# Patient Record
Sex: Female | Born: 1950 | Race: Black or African American | Hispanic: No | Marital: Married | Smoking: Former smoker
Health system: Southern US, Community
[De-identification: ages and names within clinical notes are randomized; demographics above are authoritative.]

## PROBLEM LIST (undated history)

## (undated) DIAGNOSIS — Z973 Presence of spectacles and contact lenses: Secondary | ICD-10-CM

## (undated) DIAGNOSIS — G4700 Insomnia, unspecified: Secondary | ICD-10-CM

## (undated) DIAGNOSIS — I279 Pulmonary heart disease, unspecified: Secondary | ICD-10-CM

## (undated) DIAGNOSIS — I10 Essential (primary) hypertension: Secondary | ICD-10-CM

## (undated) DIAGNOSIS — K08109 Complete loss of teeth, unspecified cause, unspecified class: Secondary | ICD-10-CM

## (undated) DIAGNOSIS — J449 Chronic obstructive pulmonary disease, unspecified: Secondary | ICD-10-CM

## (undated) DIAGNOSIS — I5032 Chronic diastolic (congestive) heart failure: Secondary | ICD-10-CM

## (undated) DIAGNOSIS — I2510 Atherosclerotic heart disease of native coronary artery without angina pectoris: Secondary | ICD-10-CM

## (undated) DIAGNOSIS — I509 Heart failure, unspecified: Secondary | ICD-10-CM

## (undated) DIAGNOSIS — E785 Hyperlipidemia, unspecified: Secondary | ICD-10-CM

## (undated) DIAGNOSIS — N182 Chronic kidney disease, stage 2 (mild): Secondary | ICD-10-CM

## (undated) DIAGNOSIS — E039 Hypothyroidism, unspecified: Secondary | ICD-10-CM

## (undated) DIAGNOSIS — Z789 Other specified health status: Secondary | ICD-10-CM

## (undated) DIAGNOSIS — R0602 Shortness of breath: Secondary | ICD-10-CM

## (undated) DIAGNOSIS — J849 Interstitial pulmonary disease, unspecified: Secondary | ICD-10-CM

## (undated) DIAGNOSIS — R911 Solitary pulmonary nodule: Secondary | ICD-10-CM

## (undated) HISTORY — PX: MULTIPLE TOOTH EXTRACTIONS: SHX2053

## (undated) HISTORY — PX: FRACTURE SURGERY: SHX138

## (undated) HISTORY — DX: Solitary pulmonary nodule: R91.1

## (undated) HISTORY — PX: TUBAL LIGATION: SHX77

## (undated) HISTORY — DX: Chronic obstructive pulmonary disease, unspecified: J44.9

## (undated) HISTORY — DX: Essential (primary) hypertension: I10

## (undated) HISTORY — DX: Hypothyroidism, unspecified: E03.9

## (undated) HISTORY — DX: Hyperlipidemia, unspecified: E78.5

## (undated) HISTORY — DX: Hypercalcemia: E83.52

## (undated) HISTORY — DX: Pulmonary heart disease, unspecified: I27.9

## (undated) HISTORY — DX: Heart failure, unspecified: I50.9

## (undated) HISTORY — DX: Shortness of breath: R06.02

## (undated) HISTORY — DX: Chronic kidney disease, stage 2 (mild): N18.2

## (undated) HISTORY — DX: Atherosclerotic heart disease of native coronary artery without angina pectoris: I25.10

## (undated) HISTORY — DX: Insomnia, unspecified: G47.00

---

## 2010-08-30 HISTORY — PX: GALLBLADDER SURGERY: SHX652

## 2010-12-02 ENCOUNTER — Ambulatory Visit: Payer: Self-pay | Admitting: Family Medicine

## 2010-12-03 ENCOUNTER — Ambulatory Visit: Payer: Self-pay | Admitting: Family Medicine

## 2010-12-04 ENCOUNTER — Ambulatory Visit: Payer: Self-pay | Admitting: Surgery

## 2010-12-04 DIAGNOSIS — I10 Essential (primary) hypertension: Secondary | ICD-10-CM

## 2012-08-22 ENCOUNTER — Inpatient Hospital Stay: Payer: Self-pay | Admitting: Internal Medicine

## 2012-08-22 LAB — BASIC METABOLIC PANEL
Co2: 23 mmol/L (ref 21–32)
Creatinine: 1.85 mg/dL — ABNORMAL HIGH (ref 0.60–1.30)
EGFR (African American): 33 — ABNORMAL LOW
EGFR (Non-African Amer.): 29 — ABNORMAL LOW
Glucose: 75 mg/dL (ref 65–99)
Osmolality: 268 (ref 275–301)
Sodium: 132 mmol/L — ABNORMAL LOW (ref 136–145)

## 2012-08-22 LAB — CBC
HGB: 12.7 g/dL (ref 12.0–16.0)
Platelet: 261 10*3/uL (ref 150–440)
RBC: 3.9 10*6/uL (ref 3.80–5.20)
WBC: 11.1 10*3/uL — ABNORMAL HIGH (ref 3.6–11.0)

## 2012-08-23 LAB — BASIC METABOLIC PANEL
Anion Gap: 10 (ref 7–16)
BUN: 30 mg/dL — ABNORMAL HIGH (ref 7–18)
Co2: 23 mmol/L (ref 21–32)
Creatinine: 1.56 mg/dL — ABNORMAL HIGH (ref 0.60–1.30)
EGFR (African American): 41 — ABNORMAL LOW
EGFR (Non-African Amer.): 35 — ABNORMAL LOW
Glucose: 72 mg/dL (ref 65–99)
Osmolality: 269 (ref 275–301)
Sodium: 132 mmol/L — ABNORMAL LOW (ref 136–145)

## 2012-08-23 LAB — CBC WITH DIFFERENTIAL/PLATELET
Basophil #: 0 10*3/uL (ref 0.0–0.1)
Basophil %: 0.2 %
Eosinophil #: 0 10*3/uL (ref 0.0–0.7)
Eosinophil %: 0 %
HCT: 34.5 % — ABNORMAL LOW (ref 35.0–47.0)
HGB: 11.8 g/dL — ABNORMAL LOW (ref 12.0–16.0)
Lymphocyte #: 0.8 10*3/uL — ABNORMAL LOW (ref 1.0–3.6)
Lymphocyte %: 7.5 %
MCH: 34.9 pg — ABNORMAL HIGH (ref 26.0–34.0)
MCV: 102 fL — ABNORMAL HIGH (ref 80–100)
Monocyte %: 1.9 %
Neutrophil #: 9.9 10*3/uL — ABNORMAL HIGH (ref 1.4–6.5)
RBC: 3.39 10*6/uL — ABNORMAL LOW (ref 3.80–5.20)
RDW: 13.7 % (ref 11.5–14.5)

## 2012-08-24 LAB — VANCOMYCIN, TROUGH: Vancomycin, Trough: 14 ug/mL (ref 10–20)

## 2012-08-24 LAB — CBC WITH DIFFERENTIAL/PLATELET
Basophil %: 0.4 %
Eosinophil %: 0.3 %
HCT: 34 % — ABNORMAL LOW (ref 35.0–47.0)
HGB: 11.4 g/dL — ABNORMAL LOW (ref 12.0–16.0)
Lymphocyte #: 1 10*3/uL (ref 1.0–3.6)
Lymphocyte %: 9.2 %
MCH: 33.9 pg (ref 26.0–34.0)
MCV: 101 fL — ABNORMAL HIGH (ref 80–100)
Monocyte #: 0.3 x10 3/mm (ref 0.2–0.9)
Monocyte %: 3.2 %
Platelet: 236 10*3/uL (ref 150–440)
RBC: 3.37 10*6/uL — ABNORMAL LOW (ref 3.80–5.20)
WBC: 10.8 10*3/uL (ref 3.6–11.0)

## 2012-08-24 LAB — BASIC METABOLIC PANEL
Anion Gap: 7 (ref 7–16)
Calcium, Total: 8 mg/dL — ABNORMAL LOW (ref 8.5–10.1)
Chloride: 106 mmol/L (ref 98–107)
Co2: 25 mmol/L (ref 21–32)
Creatinine: 0.92 mg/dL (ref 0.60–1.30)
Glucose: 80 mg/dL (ref 65–99)
Osmolality: 274 (ref 275–301)

## 2012-08-25 DIAGNOSIS — I059 Rheumatic mitral valve disease, unspecified: Secondary | ICD-10-CM

## 2012-08-26 LAB — CBC WITH DIFFERENTIAL/PLATELET
Basophil #: 0 10*3/uL (ref 0.0–0.1)
Eosinophil #: 0 10*3/uL (ref 0.0–0.7)
HCT: 32.8 % — ABNORMAL LOW (ref 35.0–47.0)
HGB: 11.4 g/dL — ABNORMAL LOW (ref 12.0–16.0)
Lymphocyte #: 1.1 10*3/uL (ref 1.0–3.6)
MCHC: 34.9 g/dL (ref 32.0–36.0)
Monocyte #: 0.9 x10 3/mm (ref 0.2–0.9)
Monocyte %: 10.7 %
Neutrophil #: 6.6 10*3/uL — ABNORMAL HIGH (ref 1.4–6.5)
Neutrophil %: 75.9 %
Platelet: 235 10*3/uL (ref 150–440)
RDW: 13.8 % (ref 11.5–14.5)
WBC: 8.7 10*3/uL (ref 3.6–11.0)

## 2012-08-26 LAB — CULTURE, BLOOD (SINGLE)

## 2012-10-03 ENCOUNTER — Ambulatory Visit: Payer: Self-pay | Admitting: Family Medicine

## 2012-10-24 ENCOUNTER — Ambulatory Visit: Payer: Self-pay | Admitting: Internal Medicine

## 2013-05-14 ENCOUNTER — Ambulatory Visit: Payer: Self-pay | Admitting: Internal Medicine

## 2014-05-14 ENCOUNTER — Ambulatory Visit: Payer: Self-pay | Admitting: Nephrology

## 2014-08-13 ENCOUNTER — Ambulatory Visit: Payer: Self-pay | Admitting: Internal Medicine

## 2014-09-30 ENCOUNTER — Ambulatory Visit: Payer: Self-pay | Admitting: Physician Assistant

## 2014-09-30 DIAGNOSIS — R0602 Shortness of breath: Secondary | ICD-10-CM

## 2014-10-02 ENCOUNTER — Encounter: Payer: Self-pay | Admitting: Cardiovascular Disease

## 2014-10-21 ENCOUNTER — Ambulatory Visit: Payer: Self-pay | Admitting: Physician Assistant

## 2014-12-17 NOTE — H&P (Signed)
PATIENT NAME:  Melissa Knox, Melissa Knox MR#:  [id number] DATE OF BIRTH:  12/06/1950  DATE OF ADMISSION:  08/22/2012  PRIMARY CARE PHYSICIAN: Tomasita Morrow, M.D.   CHIEF COMPLAINT: Cough, chills, shortness of breath along with right-sided chest pain.   HISTORY OF PRESENTING ILLNESS: A 64 year old female patient, smoker with  hypertension, who presents to the Emergency Room complaining of 2 days of left-sided chest pain, cough, shortness of breath and chills. The patient has had worsening symptoms, presented to the Emergency Room. Here her saturations were 84% on room air, needing 2 liters of oxygen to keep her saturations over 90%. Chest x-ray has shown severe pneumonia bilaterally, more so on the right side, and the patient is being admitted to the hospitalist service with community-acquired pneumonia and acute respiratory failure.   Her left-sided chest pain is pleuritic on taking a deep breath or coughing. No radiation. She has not had any fever, no sick contacts; did get an influenza vaccine this season. Smokes a pack a, day for many decades.   PAST MEDICAL HISTORY:  Hypertension, tobacco abuse.   PAST SURGICAL HISTORY:  Cholecystectomy.   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 She smokes a pack a day. No alcohol. No illicit drugs. Lives with her husband.   FAMILY HISTORY: Reviewed and unknown.   REVIEW OF SYSTEMS: CONSTITUTIONAL: Complains of fatigue, weakness, chills. EYES:  No blurred vision, pain or redness.  ENT: No tinnitus, ear pain, hearing loss.  RESPIRATORY: Has cough, shortness of breath.  CARDIOVASCULAR: Has right-sided chest pain, pleuritic. No edema or arrhythmia.  GASTROINTESTINAL: No nausea, vomiting, diarrhea, abdominal pain.  GENITOURINARY: No dysuria, hematuria, does complain of decreased urination.  ENDOCRINE: No polyuria, nocturia or thyroid problems.  HEMATOLOGIC/LYMPHATIC: No anemia, easy bruising, bleeding.  INTEGUMENT: No  rash, lesions.  MUSCULOSKELETAL: No back pain, shoulder pain, knee pain.  NEUROLOGIC: No focal numbness, weakness, dysarthria.  PSYCHIATRIC: No anxiety or depression.   HOME MEDICATIONS:  Include:  1.  Amlodipine 10 mg oral once a day.  2.  Losartan 50 mg oral once a day.   ALLERGIES: No known drug allergies.   PHYSICAL EXAMINATION: VITAL SIGNS: Temperature 99.6, pulse 112, respirations 22, blood pressure 105/58, saturating 98% on 2 liters oxygen, 84% on room air.  GENERAL: Moderately-built African American female patient lying in bed in significant respiratory distress.  PSYCHIATRIC: Alert and oriented x 3. Mood and affect appropriate. Judgment intact.  HEENT: Atraumatic, normocephalic. Oral mucosa moist and pink. External ears and nose normal. No pallor. No icterus. Pupils bilaterally equal and react to light.  NECK: Supple. No thyromegaly. No palpable lymph nodes. Trachea midline. No carotid bruit or JVD.  CARDIOVASCULAR: S1, S2, tachycardic without any murmurs. Peripheral pulses 2+. No edema.  RESPIRATORY: Increased work of breathing, using accessory muscles. Bilateral crackles all over. Good air entry on both sides. No wheezing.  GASTROINTESTINAL: Soft abdomen, nontender. Bowel sounds present. No hepatosplenomegaly palpable.  SKIN: Warm and dry. No petechiae, rash, ulcers.  GENITOURINARY: No bladder distention. No suprapubic tenderness. No CVA tenderness.  MUSCULOSKELETAL: No joint swelling, redness, effusion of the large joints. Normal muscle tone.  NEUROLOGICAL: Motor strength 5/5 in upper and lower extremities. Sensation to fine touch intact all over. Cranial nerves II through XII are intact.   LABORATORY, DIAGNOSTIC AND RADIOLOGICAL DATA: Laboratory studies show glucose of 75, BUN 25, creatinine 1.85, sodium 132, potassium 3.4, chloride 97. Troponin less than 0.02. WBC 11.1, hemoglobin 12.7 and platelets 261.  Chest x-ray shows bilateral pneumonia, more on the right, with chronic  changes, possible interstitial lung disease.   ASSESSMENT AND PLAN: 1.  Acute respiratory failure secondary to community-acquired pneumonia. The patient will be started on ceftriaxone and azithromycin. Blood cultures and sputum cultures to be sent. Will wait for cultures. I have counseled the patient to quit smoking for greater than 3 minutes as this is a contributing cause. We will check an influenza A and B test.  2.  Sepsis secondary to the pneumonia.  3. Acute renal failure secondary to sepsis and pneumonia. We will start her on IV fluids and closely monitor. If there is no urine output, will get an ultrasound of the bladder, ureters and kidneys.  4.  Hypertension. Continue the amlodipine.  Will hold her losartan secondary to acute renal failure, can be restarted once BUN and creatinine stabilize.  5.  Deep vein thrombosis prophylaxis with heparin.   CODE STATUS: Full code.   TIME SPENT: Time spent on this case was 40 minutes.    ____________________________ Leia Alf Jakeia Carreras, MD srs:cs D: 08/22/2012 18:43:58 ET T: 08/22/2012 19:26:47 ET JOB#: [id number]  cc: Alveta Heimlich R. Fortino Haag, MD, <Dictator> Myrle Sheng. Jimmye Norman, MD Neita Carp MD ELECTRONICALLY SIGNED 08/24/2012 14:05

## 2014-12-20 NOTE — Discharge Summary (Signed)
PATIENT NAME:  Melissa Knox, Melissa Knox MR#:  [id number] DATE OF BIRTH:  [birth year]  DATE OF ADMISSION:  08/22/2012 DATE OF DISCHARGE:  08/31/2012  ADMITTING DIAGNOSIS:  Acute respiratory failure, community-acquired pneumonia.   DISCHARGE DIAGNOSES: 1.  Sepsis.  2.  Streptococcal pneumonia due to bacterial pneumonia.  3.  Pneumonia.  4.  Acute respiratory failure due to pneumonia.  5.  Acute renal failure, resolved on intravenous fluids.  6.  Congestive heart failure,  acute diastolic after fluid resuscitation.  7.  Chronic obstructive pulmonary disease exacerbation due to pneumonia.  8.  Hypertension.  9.  Generalized weakness.  10.  Paroxysmal cough.  11.  Tobacco abuse.   DISCHARGE CONDITION:  Stable.   DISCHARGE MEDICATIONS:   1.  The patient is to continue Cozaar 50 mg by mouth daily.  2.  Levaquin 500 mg by mouth daily for 7 more days.  3.  Prednisone taper 50 mg by mouth once on 09/01/2012, then taper by 10 mg daily until stopped.  4.  DuoNebs 3 mL 4 times daily.  5.  Fluticasone salmeterol 250/50, 1 puff twice daily.  6.  Tiotropium 1 capsule once daily.  7.  Furosemide 20 mg by mouth daily. 8.  Guaifenesin 600 mg by mouth twice daily.  9.  Docusate sodium 100 mg by mouth twice daily.  10. Nicotine oral inhaler 1 inhalation every 1 hour as needed.  11.  Nicotine transdermal patch 14 mg topically once daily.  12.  Tussionex 5 mL twice daily.  13.  Codeine, guaifenesin syrup 10 mL every 4 hours as needed.  14.  Metoprolol 25 mg 1/2 tablet twice a day.    The patient will be discharged with  home oxygen with portable tank at 2 liters of oxygen through nasal cannula continuously.  The patient's physician was advised to wean it off as tolerated at the physician's office.  DIET:  2 gram salt, regular consistency.   ACTIVITY LIMITATIONS:  As tolerated.   REFERRAL:  To home health, physical therapy as well as R.N.    FOLLOWUP APPOINTMENTS:  Dr. Jimmye Norman in 2 days after discharge.      CONSULTANTS:  Case management.   RADIOLOGIC STUDIES:  Chest PA and lateral 08/22/2012 reveals severe bilateral pneumonia.  Followup chest x-rays were suggested.  Repeat chest x-ray 08/23/2012, PA and lateral, revealing bilateral lower lobes as well as a lesser degree of upper lobe interstitial thickening with more focal parenchymal opacities involving the right lower lobe and right middle lobe and to a lesser degree the lingula, most concerning for multilobar pneumonia.  There is no definite pleural effusion or pneumothorax.  Heart and mediastinum are unremarkable.  Also, structures are unremarkable.  Repeat chest x-ray 08/26/2012, chest PA and lateral, showed persistent bilateral pulmonary infiltrates consistent with pneumonia.  Slow clearing of right base was noted as well.  CT scan of chest without contrast on 08/24/2012 revealed moderate emphysema with opacities that may represent superimposed infection, pulmonary edema or interstitial lung disease.  Clinical correlation and followup chest radiographs are recommended.  There are a few scattered small nonspecific nodular densities within the lungs and a followup noncontrast chest CT is recommended in 3 months.  Mild enlargement of mediastinal and right hilar lymph nodes which are nonspecific and may reflect reactive to aforementioned findings.  Echocardiogram 08/25/2012 showed left ventricular ejection fraction normal.  Ejection fraction more than 55%.  The right ventricular systolic function is normal.  Left atrium is mildly dilated.  Right ventricular systolic  pressure is elevated at 30 to 40 mmHg.   HISTORY OF PRESENT ILLNESS:  The patient is a 64 year old African American female with past medical history significant for history of tobacco abuse who presented to the hospital with complaints of fevers and chills as well as cough and associated right-sided chest pain.  Please refer to Dr. Boykin Reaper admission note on 08/22/2012.  The patient was short of  breath and her saturations were 84% on room air, her temperature was 99.6, initially pulse was 112, respiration rate was 22, blood pressure 105/58, saturation was 84 on room air and 90 on 2 liters of oxygen through nasal cannula.  She had increased work of breathing using her accessory muscles and had bilateral crackles all over her lung fields, otherwise unremarkable study.  The patient's lab data on 08/22/2012 showed elevated BUN and creatinine to 25 and 1.85.  Sodium 132, potassium was 3.4, otherwise BMP was unremarkable.  The patient's troponin was less than 0.02.  The patient's white blood cell count was elevated to 11.1, hemoglobin was 12.7, platelet count 261.  Blood cultures taken on 08/22/2102 revealed streptococcus pneumonia, growth in 4/4 bottles, sensitive to all antibiotics.  The patient was started on Rocephin, Zithromax and given IV fluids because of acute renal failure.  She improved somewhat, but not significantly and required still high flow of oxygen up to as high as 50% of oxygen through high flow nasal cannula.  She was managed on the same antibiotics for a few days with no significant improvement of her symptoms.  She had a CT scan done of her chest which showed possible fluid collection or pulmonary edema, so she was initiated on Lasix and with this therapy she improved significantly.  It was felt that patient's sepsis was very likely pneumonia related, however patient's acute respiratory failure was very likely due to pneumonia as well as congestive heart failure, which was felt to be acute diastolic.  The patient was released with improvement of her condition as well as improvement of her oxygenation.  The patient is to continue antibiotics for 7 days to complete a 14 day course.    In regards to congestive heart failure, the patient was advised to use beta-blockers as well as losartan upon discharge.  She is to follow up with her primary care physician for further recommendations.   Initially the patient's ARB was placed on hold due to acute renal failure; however, later on it was restarted.  The patient is to continue Cozaar and advance the dose if needed.    In regards to acute renal failure, patient's creatinine was noted to be elevated to 1.85 on the day of admission, 08/22/2012.  It was very likely due to hypotension which was present during initial admission at the time; however, patient was rehydrated and her creatinine level normalized to 0.92 by 08/24/2012.  Now, the patient is being diuresed and is recommended to follow patient's creatinine levels as outpatient to ensure its stability.  The patient was noted to be hypokalemic with potassium level of 3.4.  The patient's potassium level was normal by 08/27/2012, at 3.8.  It is recommended to follow patient's potassium levels as outpatient since she is still on diuretics.  We did not start her on potassium supplements due to reinstatement of her usual dose of Cozaar and concerns about hyperkalemia with dual agents.   In regards to COPD exacerbation, the patient was managed on stress antibiotics initially; however, later because of her poor response to just antibiotic  therapy, she was started on steroids.  With this therapy, with steroids as well as inhalation therapy, her respiratory failure resolved very quickly.  The patient is to continue steroid tapering doses as well as inhalation therapy.  She was also qualified for oxygen at home and she qualified for 2 liters of oxygen through nasal cannula at rest as well as on exertion.  At rest, patient's oxygenation was much more stable and she was somewhat quite well-oxygenated on room air at rest, up to 98%; however, on exertion she required oxygen at 2 liters of oxygen through nasal cannula and her oxygen saturation was 90% on 2 liters of oxygen on exertion.  The patient's oxygen should be weaned off as she progressively improves.   Next, for tobacco abuse, the patient was counseled  and nicotine replacement therapy was initiated.  She was agreeable for that.    Next, for hypertension, the patient's Norvasc was placed on hold, in fact discontinued, and beta-blockers were initiated as well as patient was resumed on Cozaar.   For generalized weakness, she was evaluated by physical therapist who recommended home health physical therapy.  The patient is being discharged with home health, physical therapy as well as R.N.  She is to follow up with her primary physician, Dr. Jimmye Norman in the next few days after discharge.   TIME SPENT:  Forty minutes.      ____________________________ Theodoro Grist, MD rv:ea D: 08/31/2012 19:25:00 ET T: 09/01/2012 00:32:17 ET JOB#: [id number]  cc: Dr. Louie Bun, MD, <Dictator>   [place] MD ELECTRONICALLY SIGNED 09/24/2012 21:04

## 2015-02-03 DIAGNOSIS — E039 Hypothyroidism, unspecified: Secondary | ICD-10-CM | POA: Diagnosis not present

## 2015-02-03 DIAGNOSIS — F17211 Nicotine dependence, cigarettes, in remission: Secondary | ICD-10-CM | POA: Diagnosis not present

## 2015-02-03 DIAGNOSIS — J432 Centrilobular emphysema: Secondary | ICD-10-CM | POA: Diagnosis not present

## 2015-02-03 DIAGNOSIS — I279 Pulmonary heart disease, unspecified: Secondary | ICD-10-CM | POA: Diagnosis not present

## 2015-02-05 DIAGNOSIS — J449 Chronic obstructive pulmonary disease, unspecified: Secondary | ICD-10-CM | POA: Diagnosis not present

## 2015-02-05 DIAGNOSIS — E039 Hypothyroidism, unspecified: Secondary | ICD-10-CM | POA: Diagnosis not present

## 2015-02-05 DIAGNOSIS — E782 Mixed hyperlipidemia: Secondary | ICD-10-CM | POA: Diagnosis not present

## 2015-02-05 DIAGNOSIS — Z124 Encounter for screening for malignant neoplasm of cervix: Secondary | ICD-10-CM | POA: Diagnosis not present

## 2015-02-05 DIAGNOSIS — Z0001 Encounter for general adult medical examination with abnormal findings: Secondary | ICD-10-CM | POA: Diagnosis not present

## 2015-02-05 DIAGNOSIS — I10 Essential (primary) hypertension: Secondary | ICD-10-CM | POA: Diagnosis not present

## 2015-02-05 DIAGNOSIS — R300 Dysuria: Secondary | ICD-10-CM | POA: Diagnosis not present

## 2015-02-05 DIAGNOSIS — R87615 Unsatisfactory cytologic smear of cervix: Secondary | ICD-10-CM | POA: Diagnosis not present

## 2015-02-05 DIAGNOSIS — R87810 Cervical high risk human papillomavirus (HPV) DNA test positive: Secondary | ICD-10-CM | POA: Diagnosis not present

## 2015-02-05 DIAGNOSIS — N182 Chronic kidney disease, stage 2 (mild): Secondary | ICD-10-CM | POA: Diagnosis not present

## 2015-02-13 DIAGNOSIS — M85852 Other specified disorders of bone density and structure, left thigh: Secondary | ICD-10-CM | POA: Diagnosis not present

## 2015-02-13 DIAGNOSIS — M85862 Other specified disorders of bone density and structure, left lower leg: Secondary | ICD-10-CM | POA: Diagnosis not present

## 2015-02-13 DIAGNOSIS — E2839 Other primary ovarian failure: Secondary | ICD-10-CM | POA: Diagnosis not present

## 2015-02-13 DIAGNOSIS — Z1231 Encounter for screening mammogram for malignant neoplasm of breast: Secondary | ICD-10-CM | POA: Diagnosis not present

## 2015-02-25 DIAGNOSIS — G4710 Hypersomnia, unspecified: Secondary | ICD-10-CM | POA: Diagnosis not present

## 2015-02-28 DIAGNOSIS — R928 Other abnormal and inconclusive findings on diagnostic imaging of breast: Secondary | ICD-10-CM | POA: Diagnosis not present

## 2015-03-06 DIAGNOSIS — I10 Essential (primary) hypertension: Secondary | ICD-10-CM | POA: Diagnosis not present

## 2015-03-06 DIAGNOSIS — J449 Chronic obstructive pulmonary disease, unspecified: Secondary | ICD-10-CM | POA: Diagnosis not present

## 2015-03-06 DIAGNOSIS — J432 Centrilobular emphysema: Secondary | ICD-10-CM | POA: Diagnosis not present

## 2015-03-06 DIAGNOSIS — I279 Pulmonary heart disease, unspecified: Secondary | ICD-10-CM | POA: Diagnosis not present

## 2015-05-09 DIAGNOSIS — J449 Chronic obstructive pulmonary disease, unspecified: Secondary | ICD-10-CM | POA: Diagnosis not present

## 2015-05-09 DIAGNOSIS — K921 Melena: Secondary | ICD-10-CM | POA: Diagnosis not present

## 2015-05-09 DIAGNOSIS — I10 Essential (primary) hypertension: Secondary | ICD-10-CM | POA: Diagnosis not present

## 2015-05-09 DIAGNOSIS — Z9981 Dependence on supplemental oxygen: Secondary | ICD-10-CM | POA: Diagnosis not present

## 2015-05-09 DIAGNOSIS — N182 Chronic kidney disease, stage 2 (mild): Secondary | ICD-10-CM | POA: Diagnosis not present

## 2015-05-09 DIAGNOSIS — E039 Hypothyroidism, unspecified: Secondary | ICD-10-CM | POA: Diagnosis not present

## 2015-05-28 DIAGNOSIS — R945 Abnormal results of liver function studies: Secondary | ICD-10-CM | POA: Diagnosis not present

## 2015-06-09 DIAGNOSIS — R0602 Shortness of breath: Secondary | ICD-10-CM | POA: Diagnosis not present

## 2015-06-09 DIAGNOSIS — J432 Centrilobular emphysema: Secondary | ICD-10-CM | POA: Diagnosis not present

## 2015-06-09 DIAGNOSIS — J449 Chronic obstructive pulmonary disease, unspecified: Secondary | ICD-10-CM | POA: Diagnosis not present

## 2015-06-09 DIAGNOSIS — I279 Pulmonary heart disease, unspecified: Secondary | ICD-10-CM | POA: Diagnosis not present

## 2015-06-09 DIAGNOSIS — J9611 Chronic respiratory failure with hypoxia: Secondary | ICD-10-CM | POA: Diagnosis not present

## 2015-06-11 DIAGNOSIS — E039 Hypothyroidism, unspecified: Secondary | ICD-10-CM | POA: Diagnosis not present

## 2015-06-11 DIAGNOSIS — E559 Vitamin D deficiency, unspecified: Secondary | ICD-10-CM | POA: Diagnosis not present

## 2015-06-12 DIAGNOSIS — I279 Pulmonary heart disease, unspecified: Secondary | ICD-10-CM

## 2015-06-12 DIAGNOSIS — R0602 Shortness of breath: Secondary | ICD-10-CM

## 2015-06-12 DIAGNOSIS — E785 Hyperlipidemia, unspecified: Secondary | ICD-10-CM

## 2015-06-12 DIAGNOSIS — Z9981 Dependence on supplemental oxygen: Secondary | ICD-10-CM | POA: Diagnosis not present

## 2015-06-12 DIAGNOSIS — I2510 Atherosclerotic heart disease of native coronary artery without angina pectoris: Secondary | ICD-10-CM

## 2015-06-12 DIAGNOSIS — N182 Chronic kidney disease, stage 2 (mild): Secondary | ICD-10-CM | POA: Insufficient documentation

## 2015-06-12 DIAGNOSIS — R635 Abnormal weight gain: Secondary | ICD-10-CM | POA: Diagnosis not present

## 2015-06-12 DIAGNOSIS — E782 Mixed hyperlipidemia: Secondary | ICD-10-CM | POA: Diagnosis not present

## 2015-06-12 DIAGNOSIS — E039 Hypothyroidism, unspecified: Secondary | ICD-10-CM

## 2015-06-12 DIAGNOSIS — J449 Chronic obstructive pulmonary disease, unspecified: Secondary | ICD-10-CM | POA: Diagnosis not present

## 2015-06-12 DIAGNOSIS — G4700 Insomnia, unspecified: Secondary | ICD-10-CM

## 2015-06-12 DIAGNOSIS — J441 Chronic obstructive pulmonary disease with (acute) exacerbation: Secondary | ICD-10-CM | POA: Insufficient documentation

## 2015-06-12 DIAGNOSIS — R911 Solitary pulmonary nodule: Secondary | ICD-10-CM

## 2015-06-12 DIAGNOSIS — I10 Essential (primary) hypertension: Secondary | ICD-10-CM

## 2015-06-12 HISTORY — DX: Hyperlipidemia, unspecified: E78.5

## 2015-06-12 HISTORY — DX: Solitary pulmonary nodule: R91.1

## 2015-06-12 HISTORY — DX: Pulmonary heart disease, unspecified: I27.9

## 2015-06-12 HISTORY — DX: Insomnia, unspecified: G47.00

## 2015-06-12 HISTORY — DX: Shortness of breath: R06.02

## 2015-06-12 HISTORY — DX: Hypothyroidism, unspecified: E03.9

## 2015-06-12 HISTORY — DX: Atherosclerotic heart disease of native coronary artery without angina pectoris: I25.10

## 2015-06-12 HISTORY — DX: Essential (primary) hypertension: I10

## 2015-06-12 HISTORY — DX: Chronic kidney disease, stage 2 (mild): N18.2

## 2015-06-12 HISTORY — DX: Hypercalcemia: E83.52

## 2015-06-17 ENCOUNTER — Encounter: Payer: Self-pay | Admitting: Gastroenterology

## 2015-06-17 ENCOUNTER — Other Ambulatory Visit: Payer: Self-pay

## 2015-06-17 ENCOUNTER — Ambulatory Visit (INDEPENDENT_AMBULATORY_CARE_PROVIDER_SITE_OTHER): Payer: BLUE CROSS/BLUE SHIELD | Admitting: Gastroenterology

## 2015-06-17 VITALS — BP 187/84 | HR 96 | Temp 98.2°F | Ht 60.0 in | Wt 145.6 lb

## 2015-06-17 DIAGNOSIS — K921 Melena: Secondary | ICD-10-CM

## 2015-06-17 NOTE — Progress Notes (Signed)
Gastroenterology Consultation  Referring Provider:     No ref. provider found Primary Care Physician:  Ronnell Freshwater, NP Primary Gastroenterologist:  Dr. Allen Norris     Reason for Consultation:     Heme positive stools        HPI:   Melissa Knox is a 64 y.o. y/o female referred for consultation & management of heme positive stools by Dr. Ronnell Freshwater, NP.  This patient comes in today with a report of blood in her stools. The patient denies any visual gross blood but she was found to have blood in her stools. The patient denies ever having a colonoscopy before. She also states that her parents had cancer but none of them had colon cancer. The patient also states that she has episodes of constipation diarrhea and she believes this has something to do with her lactose intolerance. The patient states she has been lactose intolerant for some time. There is no change in bowel habits recently. She also denies any unexplained weight loss and states she has even gained some weight. There is no report of any abdominal pain fevers or chills. The patient denies any black stools.  Past Medical History  Diagnosis Date  . Insomnia 06/12/2015  . CKD (chronic kidney disease) stage 2, GFR 60-89 ml/min 06/12/2015  . Hypercalcemia 06/12/2015  . Hypothyroidism 06/12/2015  . 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 Hyperlipidemia 06/12/2015  . Hypertension 06/12/2015  . Atherosclerotic heart disease 06/12/2015  . Solitary pulmonary nodule 06/12/2015  . Shortness of breath 06/12/2015    Past Surgical History  Procedure Laterality Date  . Gallbladder surgery  2012    Prior to Admission medications   Medication Sig Start Date End Date Taking? Authorizing Provider  albuterol (PROVENTIL) (2.5 MG/3ML) 0.083% nebulizer solution Take 2.5 mg by nebulization every 6 (six) hours as needed for wheezing or shortness of breath.   Yes Historical Provider, MD  aspirin 81 MG tablet Take 81 mg by mouth daily.    Yes Historical Provider, MD  atorvastatin (LIPITOR) 10 MG tablet Take 10 mg by mouth daily.   Yes Historical Provider, MD  budesonide-formoterol (SYMBICORT) 160-4.5 MCG/ACT inhaler Inhale 2 puffs into the lungs 2 (two) times daily.   Yes Historical Provider, MD  calcitRIOL (ROCALTROL) 0.5 MCG capsule TAKE 1 CAPSULE(S) BY MOUTH DAILY FOR HYPOCALCEMIA 06/12/15  Yes Historical Provider, MD  cetirizine (ZYRTEC) 10 MG tablet Take 10 mg by mouth daily.   Yes Historical Provider, MD  diltiazem (CARDIZEM CD) 180 MG 24 hr capsule Take 180 mg by mouth daily.   Yes Historical Provider, MD  ergocalciferol (VITAMIN D2) 50000 UNITS capsule Take 50,000 Units by mouth once a week.   Yes Historical Provider, MD  levothyroxine (SYNTHROID, LEVOTHROID) 50 MCG tablet Take 50 mcg by mouth daily before breakfast.   Yes Historical Provider, MD  losartan (COZAAR) 50 MG tablet Take 50 mg by mouth daily.   Yes Historical Provider, MD  mirtazapine (REMERON) 15 MG tablet Take 15 mg by mouth at bedtime.   Yes Historical Provider, MD  ranitidine (ZANTAC) 150 MG capsule Take 150 mg by mouth 2 (two) times daily.   Yes Historical Provider, MD  calcitRIOL (ROCALTROL) 0.25 MCG capsule TAKE 1 CAPSULE(S) BY MOUTH DAILY FOR HYPOCALCEMIA 06/04/15   Historical Provider, MD    Family History  Problem Relation Age of Onset  . Cancer Mother   . Hypertension Father   . Diabetes Father   . Cancer Father  Social History  Substance Use Topics  . Smoking status: Former Research scientist (life sciences)  . Smokeless tobacco: Never Used  . Alcohol Use: No    Allergies as of 06/17/2015  . (No Known Allergies)    Review of Systems:    All systems reviewed and negative except where noted in HPI.   Physical Exam:  BP 187/84 mmHg  Pulse 96  Temp(Src) 98.2 F (36.8 C) (Oral)  Ht 5' (1.524 m)  Wt 145 lb 9.6 oz (66.044 kg)  BMI 28.44 kg/m2 No LMP recorded. Psych:  Alert and cooperative. Normal mood and affect. General:   Alert,  Well-developed,  well-nourished, pleasant and cooperative in NAD Head:  Normocephalic and atraumatic. Eyes:  Sclera clear, no icterus.   Conjunctiva pink. Ears:  Normal auditory acuity. Nose:  No deformity, discharge, or lesions. Mouth:  No deformity or lesions,oropharynx pink & moist. Neck:  Supple; no masses or thyromegaly. Lungs:  Respirations even and unlabored.  Clear throughout to auscultation.   No wheezes, crackles, or rhonchi. No acute distress. Heart:  Regular rate and rhythm; a murmur was heard in the aortic area, clicks, rubs, or gallops. Abdomen:  Normal bowel sounds.  No bruits.  Soft, non-tender and non-distended without masses, hepatosplenomegaly or hernias noted.  No guarding or rebound tenderness.  Negative Carnett sign.   Rectal:  Deferred.  Msk:  Symmetrical without gross deformities.  Good, equal movement & strength bilaterally. Pulses:  Normal pulses noted. Extremities:  No clubbing or edema.  No cyanosis. Neurologic:  Alert and oriented x3;  grossly normal neurologically. Skin:  Intact without significant lesions or rashes.  No jaundice. Lymph Nodes:  No significant cervical adenopathy. Psych:  Alert and cooperative. Normal mood and affect.  Imaging Studies: No results found.  Assessment and Plan:   Melissa Knox is a 64 y.o. y/o female who comes in today with heme positive stools. The patient denies any visualization of blood when she moves her bowels. The patient does have alternating diarrhea and constipation but this has been present for a long time. The patient denies any worse symptoms such as weight loss or family history of colon cancer. The patient will be set up for colonoscopy to evaluate the reason she may be having blood in her stools.I have discussed risks & benefits which include, but are not limited to, bleeding, infection, perforation & drug reaction.  The patient agrees with this plan & written consent will be obtained.      Note: This dictation was prepared with  Dragon dictation along with smaller phrase technology. Any transcriptional errors that result from this process are unintentional.

## 2015-07-02 DIAGNOSIS — R0602 Shortness of breath: Secondary | ICD-10-CM | POA: Diagnosis not present

## 2015-07-14 ENCOUNTER — Encounter: Payer: Self-pay | Admitting: *Deleted

## 2015-07-15 ENCOUNTER — Encounter: Admission: RE | Payer: Self-pay | Source: Ambulatory Visit

## 2015-07-15 ENCOUNTER — Ambulatory Visit
Admission: RE | Admit: 2015-07-15 | Payer: BLUE CROSS/BLUE SHIELD | Source: Ambulatory Visit | Admitting: Gastroenterology

## 2015-07-15 SURGERY — COLONOSCOPY WITH PROPOFOL
Anesthesia: General

## 2015-08-22 DIAGNOSIS — E039 Hypothyroidism, unspecified: Secondary | ICD-10-CM | POA: Diagnosis not present

## 2015-08-22 DIAGNOSIS — N182 Chronic kidney disease, stage 2 (mild): Secondary | ICD-10-CM | POA: Diagnosis not present

## 2015-09-05 DIAGNOSIS — N6489 Other specified disorders of breast: Secondary | ICD-10-CM | POA: Diagnosis not present

## 2015-09-05 DIAGNOSIS — Z1239 Encounter for other screening for malignant neoplasm of breast: Secondary | ICD-10-CM | POA: Diagnosis not present

## 2015-09-05 DIAGNOSIS — R928 Other abnormal and inconclusive findings on diagnostic imaging of breast: Secondary | ICD-10-CM | POA: Diagnosis not present

## 2015-09-11 DIAGNOSIS — I10 Essential (primary) hypertension: Secondary | ICD-10-CM | POA: Diagnosis not present

## 2015-09-11 DIAGNOSIS — N184 Chronic kidney disease, stage 4 (severe): Secondary | ICD-10-CM | POA: Diagnosis not present

## 2015-09-25 DIAGNOSIS — J9611 Chronic respiratory failure with hypoxia: Secondary | ICD-10-CM | POA: Diagnosis not present

## 2015-09-25 DIAGNOSIS — R911 Solitary pulmonary nodule: Secondary | ICD-10-CM | POA: Diagnosis not present

## 2015-09-25 DIAGNOSIS — J432 Centrilobular emphysema: Secondary | ICD-10-CM | POA: Diagnosis not present

## 2015-09-25 DIAGNOSIS — H6122 Impacted cerumen, left ear: Secondary | ICD-10-CM | POA: Diagnosis not present

## 2015-09-25 DIAGNOSIS — R0602 Shortness of breath: Secondary | ICD-10-CM | POA: Diagnosis not present

## 2015-10-02 DIAGNOSIS — J45998 Other asthma: Secondary | ICD-10-CM | POA: Diagnosis not present

## 2015-10-02 DIAGNOSIS — E039 Hypothyroidism, unspecified: Secondary | ICD-10-CM | POA: Diagnosis not present

## 2015-10-02 DIAGNOSIS — E785 Hyperlipidemia, unspecified: Secondary | ICD-10-CM | POA: Diagnosis not present

## 2015-10-02 DIAGNOSIS — E559 Vitamin D deficiency, unspecified: Secondary | ICD-10-CM | POA: Diagnosis not present

## 2015-10-02 DIAGNOSIS — N184 Chronic kidney disease, stage 4 (severe): Secondary | ICD-10-CM | POA: Diagnosis not present

## 2015-10-02 DIAGNOSIS — I10 Essential (primary) hypertension: Secondary | ICD-10-CM | POA: Diagnosis not present

## 2015-10-03 DIAGNOSIS — J45998 Other asthma: Secondary | ICD-10-CM | POA: Diagnosis not present

## 2015-10-03 DIAGNOSIS — E785 Hyperlipidemia, unspecified: Secondary | ICD-10-CM | POA: Diagnosis not present

## 2015-10-03 DIAGNOSIS — E559 Vitamin D deficiency, unspecified: Secondary | ICD-10-CM | POA: Diagnosis not present

## 2015-10-03 DIAGNOSIS — I10 Essential (primary) hypertension: Secondary | ICD-10-CM | POA: Diagnosis not present

## 2015-10-03 DIAGNOSIS — E039 Hypothyroidism, unspecified: Secondary | ICD-10-CM | POA: Diagnosis not present

## 2015-10-03 DIAGNOSIS — N184 Chronic kidney disease, stage 4 (severe): Secondary | ICD-10-CM | POA: Diagnosis not present

## 2015-10-13 DIAGNOSIS — N184 Chronic kidney disease, stage 4 (severe): Secondary | ICD-10-CM | POA: Diagnosis not present

## 2015-10-13 DIAGNOSIS — D631 Anemia in chronic kidney disease: Secondary | ICD-10-CM | POA: Diagnosis not present

## 2015-11-18 DIAGNOSIS — I279 Pulmonary heart disease, unspecified: Secondary | ICD-10-CM | POA: Diagnosis not present

## 2015-11-18 DIAGNOSIS — Z9981 Dependence on supplemental oxygen: Secondary | ICD-10-CM | POA: Diagnosis not present

## 2015-11-18 DIAGNOSIS — J449 Chronic obstructive pulmonary disease, unspecified: Secondary | ICD-10-CM | POA: Diagnosis not present

## 2015-11-18 DIAGNOSIS — E039 Hypothyroidism, unspecified: Secondary | ICD-10-CM | POA: Diagnosis not present

## 2015-11-18 DIAGNOSIS — I10 Essential (primary) hypertension: Secondary | ICD-10-CM | POA: Diagnosis not present

## 2015-11-24 DIAGNOSIS — D631 Anemia in chronic kidney disease: Secondary | ICD-10-CM | POA: Diagnosis not present

## 2015-11-24 DIAGNOSIS — I10 Essential (primary) hypertension: Secondary | ICD-10-CM | POA: Diagnosis not present

## 2015-11-24 DIAGNOSIS — N179 Acute kidney failure, unspecified: Secondary | ICD-10-CM | POA: Diagnosis not present

## 2015-11-24 DIAGNOSIS — N183 Chronic kidney disease, stage 3 (moderate): Secondary | ICD-10-CM | POA: Diagnosis not present

## 2015-12-25 ENCOUNTER — Other Ambulatory Visit: Payer: Self-pay | Admitting: Physician Assistant

## 2015-12-25 DIAGNOSIS — R1314 Dysphagia, pharyngoesophageal phase: Secondary | ICD-10-CM | POA: Diagnosis not present

## 2015-12-25 DIAGNOSIS — F17211 Nicotine dependence, cigarettes, in remission: Secondary | ICD-10-CM | POA: Diagnosis not present

## 2015-12-25 DIAGNOSIS — M5117 Intervertebral disc disorders with radiculopathy, lumbosacral region: Secondary | ICD-10-CM

## 2015-12-25 DIAGNOSIS — J432 Centrilobular emphysema: Secondary | ICD-10-CM | POA: Diagnosis not present

## 2015-12-25 DIAGNOSIS — J9611 Chronic respiratory failure with hypoxia: Secondary | ICD-10-CM | POA: Diagnosis not present

## 2016-01-09 ENCOUNTER — Ambulatory Visit
Admission: RE | Admit: 2016-01-09 | Discharge: 2016-01-09 | Disposition: A | Discharge status: Home / Self Care | Payer: BLUE CROSS/BLUE SHIELD | Source: Ambulatory Visit | Attending: Physician Assistant | Admitting: Physician Assistant

## 2016-01-09 DIAGNOSIS — M4186 Other forms of scoliosis, lumbar region: Secondary | ICD-10-CM | POA: Diagnosis not present

## 2016-01-09 DIAGNOSIS — M4806 Spinal stenosis, lumbar region: Secondary | ICD-10-CM | POA: Insufficient documentation

## 2016-01-09 DIAGNOSIS — M545 Low back pain: Secondary | ICD-10-CM | POA: Diagnosis not present

## 2016-01-09 DIAGNOSIS — M5117 Intervertebral disc disorders with radiculopathy, lumbosacral region: Secondary | ICD-10-CM

## 2016-01-09 DIAGNOSIS — M4316 Spondylolisthesis, lumbar region: Secondary | ICD-10-CM | POA: Diagnosis not present

## 2016-03-12 DIAGNOSIS — M4310 Spondylolisthesis, site unspecified: Secondary | ICD-10-CM | POA: Diagnosis not present

## 2016-03-12 DIAGNOSIS — Z6827 Body mass index (BMI) 27.0-27.9, adult: Secondary | ICD-10-CM | POA: Diagnosis not present

## 2016-03-15 DIAGNOSIS — I10 Essential (primary) hypertension: Secondary | ICD-10-CM | POA: Diagnosis not present

## 2016-03-15 DIAGNOSIS — R809 Proteinuria, unspecified: Secondary | ICD-10-CM | POA: Diagnosis not present

## 2016-03-15 DIAGNOSIS — D631 Anemia in chronic kidney disease: Secondary | ICD-10-CM | POA: Diagnosis not present

## 2016-03-15 DIAGNOSIS — N183 Chronic kidney disease, stage 3 (moderate): Secondary | ICD-10-CM | POA: Diagnosis not present

## 2016-03-19 DIAGNOSIS — R928 Other abnormal and inconclusive findings on diagnostic imaging of breast: Secondary | ICD-10-CM | POA: Diagnosis not present

## 2016-03-19 DIAGNOSIS — R922 Inconclusive mammogram: Secondary | ICD-10-CM | POA: Diagnosis not present

## 2016-03-29 DIAGNOSIS — J9611 Chronic respiratory failure with hypoxia: Secondary | ICD-10-CM | POA: Diagnosis not present

## 2016-03-29 DIAGNOSIS — J449 Chronic obstructive pulmonary disease, unspecified: Secondary | ICD-10-CM | POA: Diagnosis not present

## 2016-03-29 DIAGNOSIS — I6523 Occlusion and stenosis of bilateral carotid arteries: Secondary | ICD-10-CM | POA: Diagnosis not present

## 2016-03-31 DIAGNOSIS — M4806 Spinal stenosis, lumbar region: Secondary | ICD-10-CM | POA: Diagnosis not present

## 2016-04-05 DIAGNOSIS — M5117 Intervertebral disc disorders with radiculopathy, lumbosacral region: Secondary | ICD-10-CM | POA: Diagnosis not present

## 2016-04-05 DIAGNOSIS — Z9981 Dependence on supplemental oxygen: Secondary | ICD-10-CM | POA: Diagnosis not present

## 2016-04-05 DIAGNOSIS — I10 Essential (primary) hypertension: Secondary | ICD-10-CM | POA: Diagnosis not present

## 2016-04-05 DIAGNOSIS — I6523 Occlusion and stenosis of bilateral carotid arteries: Secondary | ICD-10-CM | POA: Diagnosis not present

## 2016-04-05 DIAGNOSIS — J449 Chronic obstructive pulmonary disease, unspecified: Secondary | ICD-10-CM | POA: Diagnosis not present

## 2016-04-05 DIAGNOSIS — E039 Hypothyroidism, unspecified: Secondary | ICD-10-CM | POA: Diagnosis not present

## 2016-04-07 ENCOUNTER — Other Ambulatory Visit: Payer: Self-pay | Admitting: Nurse Practitioner

## 2016-04-07 DIAGNOSIS — I6523 Occlusion and stenosis of bilateral carotid arteries: Secondary | ICD-10-CM

## 2016-04-09 ENCOUNTER — Other Ambulatory Visit: Payer: Self-pay | Admitting: Nephrology

## 2016-04-16 ENCOUNTER — Ambulatory Visit: Payer: Medicare Other | Admitting: Internal Medicine

## 2016-04-20 ENCOUNTER — Encounter
Admission: RE | Admit: 2016-04-20 | Discharge: 2016-04-20 | Disposition: A | Discharge status: Home / Self Care | Payer: BLUE CROSS/BLUE SHIELD | Source: Ambulatory Visit | Attending: Nephrology | Admitting: Nephrology

## 2016-04-20 ENCOUNTER — Ambulatory Visit
Admission: RE | Admit: 2016-04-20 | Discharge: 2016-04-20 | Disposition: A | Discharge status: Home / Self Care | Payer: BLUE CROSS/BLUE SHIELD | Source: Ambulatory Visit | Attending: Nurse Practitioner | Admitting: Nurse Practitioner

## 2016-04-20 DIAGNOSIS — E214 Other specified disorders of parathyroid gland: Secondary | ICD-10-CM | POA: Diagnosis not present

## 2016-04-20 DIAGNOSIS — I6523 Occlusion and stenosis of bilateral carotid arteries: Secondary | ICD-10-CM

## 2016-04-20 LAB — POCT I-STAT CREATININE
CREATININE: 2.4 mg/dL — AB (ref 0.44–1.00)
Creatinine, Ser: 2.4 mg/dL — ABNORMAL HIGH (ref 0.44–1.00)

## 2016-04-20 MED ORDER — TECHNETIUM TC 99M SESTAMIBI - CARDIOLITE
26.0500 | Freq: Once | INTRAVENOUS | Status: AC | PRN
  Administered 2016-04-20: 26.05 via INTRAVENOUS

## 2016-04-26 DIAGNOSIS — N189 Chronic kidney disease, unspecified: Secondary | ICD-10-CM

## 2016-04-26 DIAGNOSIS — D631 Anemia in chronic kidney disease: Secondary | ICD-10-CM | POA: Insufficient documentation

## 2016-04-26 NOTE — Progress Notes (Signed)
[place]  [phone] [phone]) [phone]  ID: Melissa Knox OB: [birth year]  MR#: [id number]  [id number]  Patient Care Team: Ronnell Freshwater, NP as PCP - General (Family Medicine)  CHIEF COMPLAINT: Anemia in chronic renal disease, iron deficiency anemia.  INTERVAL HISTORY: Patient is a 65 year old female with a history of chronic renal insufficiency that was noted to have a declining hemoglobin on routine blood work. She currently feels well and is asymptomatic. She does not complain of weakness or fatigue. She has good appetite and denies weight loss. She has no neurologic complaints. She denies any chest pain or shortness of breath. She denies any nausea, vomiting, constipation, or diarrhea. She has no urinary complaints. Patient feels at her baseline and offers no specific complaints today.  REVIEW OF SYSTEMS:   Review of Systems  Constitutional: Negative.  Negative for fever, malaise/fatigue and weight loss.  Respiratory: Negative.  Negative for cough, hemoptysis and shortness of breath.   Cardiovascular: Negative.  Negative for chest pain and leg swelling.  Gastrointestinal: Negative.  Negative for abdominal pain, blood in stool and melena.  Genitourinary: Negative.   Musculoskeletal: Negative.   Neurological: Negative.  Negative for weakness.  Psychiatric/Behavioral: Negative.  The patient is not nervous/anxious.     As per HPI. Otherwise, a complete review of systems is negative.  PAST MEDICAL HISTORY: Past Medical History:  Diagnosis Date  . Atherosclerotic heart disease 06/12/2015  . CKD (chronic kidney disease) stage 2, GFR 60-89 ml/min 06/12/2015  . Hypercalcemia 06/12/2015  . Hyperlipidemia 06/12/2015  . Hypertension 06/12/2015  . Hypothyroidism 06/12/2015  . Insomnia 06/12/2015  . 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 Shortness of breath 06/12/2015  . Solitary pulmonary nodule 06/12/2015    PAST SURGICAL  HISTORY: Past Surgical History:  Procedure Laterality Date  . GALLBLADDER SURGERY  2012    FAMILY HISTORY: Family History  Problem Relation Age of Onset  . Cancer Mother   . Hypertension Father   . Diabetes Father   . Cancer Father        ADVANCED DIRECTIVES (Y/N):  N   HEALTH MAINTENANCE: Social History  Substance Use Topics  . Smoking status: Former Research scientist (life sciences)  . Smokeless tobacco: Never Used  . Alcohol use No     Colonoscopy:  PAP:  Bone density:  Lipid panel:  No Known Allergies  Current Outpatient Prescriptions  Medication Sig Dispense Refill  . albuterol (PROVENTIL) (2.5 MG/3ML) 0.083% nebulizer solution Take 2.5 mg by nebulization every 6 (six) hours as needed for wheezing or shortness of breath.    Marland Kitchen aspirin 81 MG tablet Take 81 mg by mouth daily.    Marland Kitchen atorvastatin (LIPITOR) 10 MG tablet Take 10 mg by mouth daily.    . budesonide-formoterol (SYMBICORT) 160-4.5 MCG/ACT inhaler Inhale 2 puffs into the lungs 2 (two) times daily.    . cetirizine (ZYRTEC) 10 MG tablet Take 10 mg by mouth daily.    Marland Kitchen diltiazem (CARDIZEM CD) 180 MG 24 hr capsule Take 180 mg by mouth daily.    . ergocalciferol (VITAMIN D2) 50000 UNITS capsule Take 50,000 Units by mouth once a week.    . levothyroxine (SYNTHROID, LEVOTHROID) 50 MCG tablet Take 50 mcg by mouth daily before breakfast.    . mirtazapine (REMERON) 15 MG tablet Take 15 mg by mouth at bedtime.    . ranitidine (ZANTAC) 150 MG capsule Take 150 mg by mouth 2 (two) times daily.    Marland Kitchen amLODipine (NORVASC) 10 MG tablet Take  10 mg by mouth daily.  12  . hydrALAZINE (APRESOLINE) 50 MG tablet Take 50 mg by mouth 3 (three) times daily.  6  . losartan (COZAAR) 100 MG tablet Take 100 mg by mouth daily.  3   No current facility-administered medications for this visit.     OBJECTIVE: Vitals:   04/27/16 1526  BP: (!) 96/59  Pulse: 87  Resp: 17  Temp: (!) 95.8 F (35.4 C)     Body mass index is 26.46 kg/m.    ECOG FS:0 -  Asymptomatic  General: Well-developed, well-nourished, no acute distress. Eyes: Pink conjunctiva, anicteric sclera. HEENT: Normocephalic, moist mucous membranes, clear oropharnyx. Lungs: Clear to auscultation bilaterally. Heart: Regular rate and rhythm. No rubs, murmurs, or gallops. Abdomen: Soft, nontender, nondistended. No organomegaly noted, normoactive bowel sounds. Musculoskeletal: No edema, cyanosis, or clubbing. Neuro: Alert, answering all questions appropriately. Cranial nerves grossly intact. Skin: No rashes or petechiae noted. Psych: Normal affect. Lymphatics: No cervical, calvicular, axillary or inguinal LAD.   LAB RESULTS:  Lab Results  Component Value Date   NA 138 08/24/2012   K 3.8 08/27/2012   CL 106 08/24/2012   CO2 25 08/24/2012   GLUCOSE 80 08/24/2012   BUN 12 08/24/2012   CREATININE 2.40 (H) 04/20/2016   CALCIUM 8.0 (L) 08/24/2012   GFRNONAA >60 08/24/2012   GFRAA >60 08/24/2012    Lab Results  Component Value Date   WBC 8.6 04/27/2016   NEUTROABS 6.1 04/27/2016   HGB 8.8 (L) 04/27/2016   HCT 26.3 (L) 04/27/2016   MCV 85.0 04/27/2016   PLT 419 04/27/2016   Lab Results  Component Value Date   IRON 22 (L) 04/27/2016   TIBC 403 04/27/2016   IRONPCTSAT 6 (L) 04/27/2016   Lab Results  Component Value Date   FERRITIN 11 04/27/2016     STUDIES: Nm Parathyroid W/spect/ct  Result Date: 04/20/2016 CLINICAL DATA:  Find hypercalcemia.  PTH equal 20 EXAM: NM PARATHYROID SCINTIGRAPHY AND SPECT IMAGING TECHNIQUE: Following intravenous administration of radiopharmaceutical, early and 2-hour delayed planar images were obtained in the anterior projection. Delayed triplanar SPECT images were also obtained at 2 hours. RADIOPHARMACEUTICALS:  26.5 mCi Tc-35m Sestamibi IV COMPARISON:  None. FINDINGS: Anterior planar imaging at 2 hour delayed demonstrates no focality in thyroid bed. SPECT CT imaging of the head and neck the demonstrates no focal activity within the  thyroid bed or chest to localize parathyroid adenoma. However on the SPECT CT - CT data set there is a nodule posterior to the RIGHT lobe of thyroid gland measuring 9 mm x 8 mm (image 131, series 3) IMPRESSION: 1. No scintigraphic evidence of parathyroid adenoma. 2. On the CT imaging accompanying the SPECT CT, there is a rounded 9 mm nodule posterior to the RIGHT lobe of thyroid gland which may represent a parathyroid adenoma with low activity. Electronically Signed   By: Suzy Bouchard M.D.   On: 04/20/2016 17:00    ASSESSMENT: Anemia in chronic renal disease, Iron deficiency anemia.  PLAN:    1. Iron deficiency anemia: Patient's hemoglobin and iron stores are decreased, therefore she will benefit from IV iron. Return to clinic in 1 week for further evaluation and initiation of 510 mg IV Feraheme. Plan to give 2 infusions separated by 1 week and then will have patient follow-up in 3 months to assess whether she also requires Procrit. 2. Anemia in chronic renal disease: Plan to give IV iron as above first and then Procrit if her hemoglobin remains  below 10.0. 3. Chronic renal insufficiency: Patient's creatinine appears to be at her baseline. Continued evaluation and treatment per nephrology.  Patient expressed understanding and was in agreement with this plan. She also understands that She can call clinic at any time with any questions, concerns, or complaints.    Lloyd Huger, MD   05/01/2016 8:17 AM

## 2016-04-27 ENCOUNTER — Inpatient Hospital Stay: Payer: BLUE CROSS/BLUE SHIELD | Attending: Internal Medicine | Admitting: Oncology

## 2016-04-27 ENCOUNTER — Encounter (INDEPENDENT_AMBULATORY_CARE_PROVIDER_SITE_OTHER): Payer: Self-pay

## 2016-04-27 ENCOUNTER — Encounter: Payer: Self-pay | Admitting: Oncology

## 2016-04-27 ENCOUNTER — Inpatient Hospital Stay: Payer: BLUE CROSS/BLUE SHIELD

## 2016-04-27 VITALS — BP 96/59 | HR 87 | Temp 95.8°F | Resp 17 | Ht 60.0 in | Wt 135.5 lb

## 2016-04-27 DIAGNOSIS — Z7982 Long term (current) use of aspirin: Secondary | ICD-10-CM | POA: Insufficient documentation

## 2016-04-27 DIAGNOSIS — E785 Hyperlipidemia, unspecified: Secondary | ICD-10-CM

## 2016-04-27 DIAGNOSIS — I279 Pulmonary heart disease, unspecified: Secondary | ICD-10-CM | POA: Diagnosis not present

## 2016-04-27 DIAGNOSIS — Z87891 Personal history of nicotine dependence: Secondary | ICD-10-CM | POA: Diagnosis not present

## 2016-04-27 DIAGNOSIS — Z809 Family history of malignant neoplasm, unspecified: Secondary | ICD-10-CM | POA: Diagnosis not present

## 2016-04-27 DIAGNOSIS — D631 Anemia in chronic kidney disease: Secondary | ICD-10-CM | POA: Insufficient documentation

## 2016-04-27 DIAGNOSIS — R911 Solitary pulmonary nodule: Secondary | ICD-10-CM | POA: Diagnosis not present

## 2016-04-27 DIAGNOSIS — N182 Chronic kidney disease, stage 2 (mild): Secondary | ICD-10-CM | POA: Insufficient documentation

## 2016-04-27 DIAGNOSIS — I2510 Atherosclerotic heart disease of native coronary artery without angina pectoris: Secondary | ICD-10-CM

## 2016-04-27 DIAGNOSIS — G4700 Insomnia, unspecified: Secondary | ICD-10-CM | POA: Insufficient documentation

## 2016-04-27 DIAGNOSIS — Z79899 Other long term (current) drug therapy: Secondary | ICD-10-CM | POA: Diagnosis not present

## 2016-04-27 DIAGNOSIS — N189 Chronic kidney disease, unspecified: Principal | ICD-10-CM

## 2016-04-27 DIAGNOSIS — I129 Hypertensive chronic kidney disease with stage 1 through stage 4 chronic kidney disease, or unspecified chronic kidney disease: Secondary | ICD-10-CM | POA: Diagnosis not present

## 2016-04-27 DIAGNOSIS — D509 Iron deficiency anemia, unspecified: Secondary | ICD-10-CM | POA: Diagnosis not present

## 2016-04-27 DIAGNOSIS — E039 Hypothyroidism, unspecified: Secondary | ICD-10-CM | POA: Diagnosis not present

## 2016-04-27 LAB — CBC WITH DIFFERENTIAL/PLATELET
BASOS ABS: 0.1 10*3/uL (ref 0–0.1)
Basophils Relative: 1 %
EOS PCT: 2 %
Eosinophils Absolute: 0.1 10*3/uL (ref 0–0.7)
HEMATOCRIT: 26.3 % — AB (ref 35.0–47.0)
Hemoglobin: 8.8 g/dL — ABNORMAL LOW (ref 12.0–16.0)
LYMPHS ABS: 1.7 10*3/uL (ref 1.0–3.6)
LYMPHS PCT: 20 %
MCH: 28.3 pg (ref 26.0–34.0)
MCHC: 33.3 g/dL (ref 32.0–36.0)
MCV: 85 fL (ref 80.0–100.0)
Monocytes Absolute: 0.6 10*3/uL (ref 0.2–0.9)
Monocytes Relative: 7 %
NEUTROS ABS: 6.1 10*3/uL (ref 1.4–6.5)
Neutrophils Relative %: 70 %
PLATELETS: 419 10*3/uL (ref 150–440)
RBC: 3.09 MIL/uL — AB (ref 3.80–5.20)
RDW: 16.9 % — ABNORMAL HIGH (ref 11.5–14.5)
WBC: 8.6 10*3/uL (ref 3.6–11.0)

## 2016-04-27 LAB — IRON AND TIBC
Iron: 22 ug/dL — ABNORMAL LOW (ref 28–170)
SATURATION RATIOS: 6 % — AB (ref 10.4–31.8)
TIBC: 403 ug/dL (ref 250–450)
UIBC: 381 ug/dL

## 2016-04-27 LAB — FERRITIN: Ferritin: 11 ng/mL (ref 11–307)

## 2016-04-27 LAB — LACTATE DEHYDROGENASE: LDH: 202 U/L — AB (ref 98–192)

## 2016-05-01 DIAGNOSIS — D509 Iron deficiency anemia, unspecified: Secondary | ICD-10-CM | POA: Insufficient documentation

## 2016-05-03 NOTE — Progress Notes (Deleted)
[place]  [phone]) [phone] [phone]) [phone]  ID: Melissa Knox OB: August 11, 1951  MR#: [id number]  [id number]  Patient Care Team: Ronnell Freshwater, NP as PCP - General (Family Medicine)  CHIEF COMPLAINT: Anemia in chronic renal disease, iron deficiency anemia.  INTERVAL HISTORY: Patient is a 65 year old female with a history of chronic renal insufficiency that was noted to have a declining hemoglobin on routine blood work. She currently feels well and is asymptomatic. She does not complain of weakness or fatigue. She has good appetite and denies weight loss. She has no neurologic complaints. She denies any chest pain or shortness of breath. She denies any nausea, vomiting, constipation, or diarrhea. She has no urinary complaints. Patient feels at her baseline and offers no specific complaints today.  REVIEW OF SYSTEMS:   Review of Systems  Constitutional: Negative.  Negative for fever, malaise/fatigue and weight loss.  Respiratory: Negative.  Negative for cough, hemoptysis and shortness of breath.   Cardiovascular: Negative.  Negative for chest pain and leg swelling.  Gastrointestinal: Negative.  Negative for abdominal pain, blood in stool and melena.  Genitourinary: Negative.   Musculoskeletal: Negative.   Neurological: Negative.  Negative for weakness.  Psychiatric/Behavioral: Negative.  The patient is not nervous/anxious.     As per HPI. Otherwise, a complete review of systems is negative.  PAST MEDICAL HISTORY: Past Medical History:  Diagnosis Date  . Atherosclerotic heart disease 06/12/2015  . CKD (chronic kidney disease) stage 2, GFR 60-89 ml/min 06/12/2015  . Hypercalcemia 06/12/2015  . Hyperlipidemia 06/12/2015  . Hypertension 06/12/2015  . Hypothyroidism 06/12/2015  . Insomnia 06/12/2015  . 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 Shortness of breath 06/12/2015  . Solitary pulmonary nodule 06/12/2015    PAST SURGICAL  HISTORY: Past Surgical History:  Procedure Laterality Date  . GALLBLADDER SURGERY  2012    FAMILY HISTORY: Family History  Problem Relation Age of Onset  . Cancer Mother   . Hypertension Father   . Diabetes Father   . Cancer Father        ADVANCED DIRECTIVES (Y/N):  N   HEALTH MAINTENANCE: Social History  Substance Use Topics  . Smoking status: Former Research scientist (life sciences)  . Smokeless tobacco: Never Used  . Alcohol use No     Colonoscopy:  PAP:  Bone density:  Lipid panel:  No Known Allergies  Current Outpatient Prescriptions  Medication Sig Dispense Refill  . albuterol (PROVENTIL) (2.5 MG/3ML) 0.083% nebulizer solution Take 2.5 mg by nebulization every 6 (six) hours as needed for wheezing or shortness of breath.    Marland Kitchen amLODipine (NORVASC) 10 MG tablet Take 10 mg by mouth daily.  12  . aspirin 81 MG tablet Take 81 mg by mouth daily.    Marland Kitchen atorvastatin (LIPITOR) 10 MG tablet Take 10 mg by mouth daily.    . budesonide-formoterol (SYMBICORT) 160-4.5 MCG/ACT inhaler Inhale 2 puffs into the lungs 2 (two) times daily.    . cetirizine (ZYRTEC) 10 MG tablet Take 10 mg by mouth daily.    Marland Kitchen diltiazem (CARDIZEM CD) 180 MG 24 hr capsule Take 180 mg by mouth daily.    . ergocalciferol (VITAMIN D2) 50000 UNITS capsule Take 50,000 Units by mouth once a week.    . hydrALAZINE (APRESOLINE) 50 MG tablet Take 50 mg by mouth 3 (three) times daily.  6  . levothyroxine (SYNTHROID, LEVOTHROID) 50 MCG tablet Take 50 mcg by mouth daily before breakfast.    . losartan (COZAAR) 100 MG tablet Take 100  mg by mouth daily.  3  . mirtazapine (REMERON) 15 MG tablet Take 15 mg by mouth at bedtime.    . ranitidine (ZANTAC) 150 MG capsule Take 150 mg by mouth 2 (two) times daily.     No current facility-administered medications for this visit.     OBJECTIVE: There were no vitals filed for this visit.   There is no height or weight on file to calculate BMI.    ECOG FS:0 - Asymptomatic  General: Well-developed,  well-nourished, no acute distress. Eyes: Pink conjunctiva, anicteric sclera. HEENT: Normocephalic, moist mucous membranes, clear oropharnyx. Lungs: Clear to auscultation bilaterally. Heart: Regular rate and rhythm. No rubs, murmurs, or gallops. Abdomen: Soft, nontender, nondistended. No organomegaly noted, normoactive bowel sounds. Musculoskeletal: No edema, cyanosis, or clubbing. Neuro: Alert, answering all questions appropriately. Cranial nerves grossly intact. Skin: No rashes or petechiae noted. Psych: Normal affect. Lymphatics: No cervical, calvicular, axillary or inguinal LAD.   LAB RESULTS:  Lab Results  Component Value Date   NA 138 08/24/2012   K 3.8 08/27/2012   CL 106 08/24/2012   CO2 25 08/24/2012   GLUCOSE 80 08/24/2012   BUN 12 08/24/2012   CREATININE 2.40 (H) 04/20/2016   CALCIUM 8.0 (L) 08/24/2012   GFRNONAA >60 08/24/2012   GFRAA >60 08/24/2012    Lab Results  Component Value Date   WBC 8.6 04/27/2016   NEUTROABS 6.1 04/27/2016   HGB 8.8 (L) 04/27/2016   HCT 26.3 (L) 04/27/2016   MCV 85.0 04/27/2016   PLT 419 04/27/2016   Lab Results  Component Value Date   IRON 22 (L) 04/27/2016   TIBC 403 04/27/2016   IRONPCTSAT 6 (L) 04/27/2016   Lab Results  Component Value Date   FERRITIN 11 04/27/2016     STUDIES: Nm Parathyroid W/spect/ct  Result Date: 04/20/2016 CLINICAL DATA:  Find hypercalcemia.  PTH equal 20 EXAM: NM PARATHYROID SCINTIGRAPHY AND SPECT IMAGING TECHNIQUE: Following intravenous administration of radiopharmaceutical, early and 2-hour delayed planar images were obtained in the anterior projection. Delayed triplanar SPECT images were also obtained at 2 hours. RADIOPHARMACEUTICALS:  26.5 mCi Tc-27m Sestamibi IV COMPARISON:  None. FINDINGS: Anterior planar imaging at 2 hour delayed demonstrates no focality in thyroid bed. SPECT CT imaging of the head and neck the demonstrates no focal activity within the thyroid bed or chest to localize  parathyroid adenoma. However on the SPECT CT - CT data set there is a nodule posterior to the RIGHT lobe of thyroid gland measuring 9 mm x 8 mm (image 131, series 3) IMPRESSION: 1. No scintigraphic evidence of parathyroid adenoma. 2. On the CT imaging accompanying the SPECT CT, there is a rounded 9 mm nodule posterior to the RIGHT lobe of thyroid gland which may represent a parathyroid adenoma with low activity. Electronically Signed   By: Suzy Bouchard M.D.   On: 04/20/2016 17:00    ASSESSMENT: Anemia in chronic renal disease, iron deficiency anemia.  PLAN:    1. Iron deficiency anemia: Patient's hemoglobin and iron stores are decreased, therefore she will benefit from IV iron. Return to clinic in 1 week for further evaluation and initiation of 510 mg IV Feraheme. Plan to give 2 infusions separated by 1 week and then will have patient follow-up in 3 months to assess whether she also requires Procrit. 2. Anemia in chronic renal disease: Plan to give IV iron as above first and then Procrit if her hemoglobin remains below 10.0. 3. Chronic renal insufficiency: Patient's creatinine appears to be  at her baseline. Continued evaluation and treatment per nephrology.  Patient expressed understanding and was in agreement with this plan. She also understands that She can call clinic at any time with any questions, concerns, or complaints.    Lloyd Huger, MD   05/03/2016 10:54 PM

## 2016-05-04 ENCOUNTER — Inpatient Hospital Stay: Payer: BLUE CROSS/BLUE SHIELD | Admitting: Oncology

## 2016-05-04 ENCOUNTER — Inpatient Hospital Stay: Payer: BLUE CROSS/BLUE SHIELD | Attending: Oncology

## 2016-05-04 VITALS — BP 101/62 | HR 85 | Temp 98.7°F | Resp 18

## 2016-05-04 DIAGNOSIS — D509 Iron deficiency anemia, unspecified: Secondary | ICD-10-CM

## 2016-05-04 DIAGNOSIS — Z79899 Other long term (current) drug therapy: Secondary | ICD-10-CM | POA: Insufficient documentation

## 2016-05-04 MED ORDER — SODIUM CHLORIDE 0.9 % IV SOLN
510.0000 mg | Freq: Once | INTRAVENOUS | Status: AC
  Administered 2016-05-04: 510 mg via INTRAVENOUS
  Filled 2016-05-04: qty 17

## 2016-05-04 MED ORDER — SODIUM CHLORIDE 0.9 % IV SOLN
Freq: Once | INTRAVENOUS | Status: AC
  Administered 2016-05-04: 15:00:00 via INTRAVENOUS
  Filled 2016-05-04: qty 1000

## 2016-05-11 ENCOUNTER — Inpatient Hospital Stay: Payer: BLUE CROSS/BLUE SHIELD

## 2016-05-11 VITALS — BP 96/59 | HR 98 | Temp 96.9°F | Resp 16

## 2016-05-11 DIAGNOSIS — Z79899 Other long term (current) drug therapy: Secondary | ICD-10-CM | POA: Diagnosis not present

## 2016-05-11 DIAGNOSIS — D509 Iron deficiency anemia, unspecified: Secondary | ICD-10-CM | POA: Diagnosis not present

## 2016-05-11 MED ORDER — SODIUM CHLORIDE 0.9 % IV SOLN
510.0000 mg | Freq: Once | INTRAVENOUS | Status: AC
  Administered 2016-05-11: 510 mg via INTRAVENOUS
  Filled 2016-05-11: qty 17

## 2016-05-11 MED ORDER — SODIUM CHLORIDE 0.9 % IV SOLN
Freq: Once | INTRAVENOUS | Status: AC
  Administered 2016-05-11: 14:00:00 via INTRAVENOUS
  Filled 2016-05-11: qty 1000

## 2016-05-20 DIAGNOSIS — I10 Essential (primary) hypertension: Secondary | ICD-10-CM | POA: Diagnosis not present

## 2016-05-20 DIAGNOSIS — E559 Vitamin D deficiency, unspecified: Secondary | ICD-10-CM | POA: Diagnosis not present

## 2016-05-20 DIAGNOSIS — E039 Hypothyroidism, unspecified: Secondary | ICD-10-CM | POA: Diagnosis not present

## 2016-05-20 DIAGNOSIS — J45998 Other asthma: Secondary | ICD-10-CM | POA: Diagnosis not present

## 2016-05-20 DIAGNOSIS — E785 Hyperlipidemia, unspecified: Secondary | ICD-10-CM | POA: Diagnosis not present

## 2016-06-21 DIAGNOSIS — J449 Chronic obstructive pulmonary disease, unspecified: Secondary | ICD-10-CM | POA: Diagnosis not present

## 2016-06-21 DIAGNOSIS — N182 Chronic kidney disease, stage 2 (mild): Secondary | ICD-10-CM | POA: Diagnosis not present

## 2016-06-21 DIAGNOSIS — Z9981 Dependence on supplemental oxygen: Secondary | ICD-10-CM | POA: Diagnosis not present

## 2016-06-21 DIAGNOSIS — I10 Essential (primary) hypertension: Secondary | ICD-10-CM | POA: Diagnosis not present

## 2016-06-21 DIAGNOSIS — J069 Acute upper respiratory infection, unspecified: Secondary | ICD-10-CM | POA: Diagnosis not present

## 2016-06-21 DIAGNOSIS — E039 Hypothyroidism, unspecified: Secondary | ICD-10-CM | POA: Diagnosis not present

## 2016-07-01 DIAGNOSIS — J432 Centrilobular emphysema: Secondary | ICD-10-CM | POA: Diagnosis not present

## 2016-07-01 DIAGNOSIS — Z9981 Dependence on supplemental oxygen: Secondary | ICD-10-CM | POA: Diagnosis not present

## 2016-07-01 DIAGNOSIS — F17211 Nicotine dependence, cigarettes, in remission: Secondary | ICD-10-CM | POA: Diagnosis not present

## 2016-07-14 DIAGNOSIS — N183 Chronic kidney disease, stage 3 (moderate): Secondary | ICD-10-CM | POA: Diagnosis not present

## 2016-07-14 DIAGNOSIS — D631 Anemia in chronic kidney disease: Secondary | ICD-10-CM | POA: Diagnosis not present

## 2016-07-14 DIAGNOSIS — R809 Proteinuria, unspecified: Secondary | ICD-10-CM | POA: Diagnosis not present

## 2016-07-14 DIAGNOSIS — I10 Essential (primary) hypertension: Secondary | ICD-10-CM | POA: Diagnosis not present

## 2016-07-30 ENCOUNTER — Other Ambulatory Visit: Payer: Self-pay | Admitting: *Deleted

## 2016-07-30 DIAGNOSIS — D509 Iron deficiency anemia, unspecified: Secondary | ICD-10-CM

## 2016-08-02 NOTE — Progress Notes (Signed)
[place]  Telephone:(336) [phone] [phone]) [phone]  ID: Melissa Knox OB: 1951/03/08  MR#: [id number]  [id number]#:[id number]  Patient Care Team: Ronnell Freshwater, NP as PCP - General (Family Medicine)  CHIEF COMPLAINT: Anemia in chronic renal disease, iron deficiency anemia.  INTERVAL HISTORY: Patient returns to clinic today for repeat laboratory work and further evaluation. She currently feels well and is asymptomatic. She does not complain of weakness or fatigue. She has a good appetite and denies weight loss. She has no neurologic complaints. She denies any chest pain or shortness of breath. She denies any nausea, vomiting, constipation, or diarrhea. She has no urinary complaints. Patient feels at her baseline and offers no specific complaints today.  REVIEW OF SYSTEMS:   Review of Systems  Constitutional: Negative.  Negative for fever, malaise/fatigue and weight loss.  Respiratory: Negative.  Negative for cough, hemoptysis and shortness of breath.   Cardiovascular: Negative.  Negative for chest pain and leg swelling.  Gastrointestinal: Negative.  Negative for abdominal pain, blood in stool and melena.  Genitourinary: Negative.   Musculoskeletal: Negative.   Neurological: Negative.  Negative for weakness.  Psychiatric/Behavioral: Negative.  The patient is not nervous/anxious.     As per HPI. Otherwise, a complete review of systems is negative.  PAST MEDICAL HISTORY: Past Medical History:  Diagnosis Date  . Atherosclerotic heart disease 06/12/2015  . CKD (chronic kidney disease) stage 2, GFR 60-89 ml/min 06/12/2015  . Hypercalcemia 06/12/2015  . Hyperlipidemia 06/12/2015  . Hypertension 06/12/2015  . Hypothyroidism 06/12/2015  . Insomnia 06/12/2015  . 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 Shortness of breath 06/12/2015  . Solitary pulmonary nodule 06/12/2015    PAST SURGICAL HISTORY: Past Surgical History:  Procedure Laterality Date  .  GALLBLADDER SURGERY  2012    FAMILY HISTORY: Family History  Problem Relation Age of Onset  . Cancer Mother   . Hypertension Father   . Diabetes Father   . Cancer Father        ADVANCED DIRECTIVES (Y/N):  N   HEALTH MAINTENANCE: Social History  Substance Use Topics  . Smoking status: Former Research scientist (life sciences)  . Smokeless tobacco: Never Used  . Alcohol use No     Colonoscopy:  PAP:  Bone density:  Lipid panel:  No Known Allergies  Current Outpatient Prescriptions  Medication Sig Dispense Refill  . albuterol (PROVENTIL) (2.5 MG/3ML) 0.083% nebulizer solution Take 2.5 mg by nebulization every 6 (six) hours as needed for wheezing or shortness of breath.    Marland Kitchen amLODipine (NORVASC) 10 MG tablet Take 10 mg by mouth daily.  12  . aspirin 81 MG tablet Take 81 mg by mouth daily.    Marland Kitchen atorvastatin (LIPITOR) 10 MG tablet Take 10 mg by mouth daily.    . budesonide-formoterol (SYMBICORT) 160-4.5 MCG/ACT inhaler Inhale 2 puffs into the lungs 2 (two) times daily.    . cetirizine (ZYRTEC) 10 MG tablet Take 10 mg by mouth daily.    Marland Kitchen diltiazem (CARDIZEM CD) 180 MG 24 hr capsule Take 180 mg by mouth daily.    . hydrALAZINE (APRESOLINE) 50 MG tablet Take 50 mg by mouth 3 (three) times daily.  6  . levothyroxine (SYNTHROID, LEVOTHROID) 50 MCG tablet Take 50 mcg by mouth daily before breakfast.    . losartan (COZAAR) 100 MG tablet Take 100 mg by mouth daily.  3  . meclizine (ANTIVERT) 12.5 MG tablet     . mirtazapine (REMERON) 15 MG tablet Take 7.5 mg by mouth at  bedtime.     . ranitidine (ZANTAC) 150 MG capsule Take 150 mg by mouth 2 (two) times daily.    . ergocalciferol (VITAMIN D2) 50000 UNITS capsule Take 50,000 Units by mouth once a week.     No current facility-administered medications for this visit.     OBJECTIVE: Vitals:   08/03/16 1422  BP: 99/64  Pulse: (!) 101  Resp: 18  Temp: 99.1 F (37.3 C)     Body mass index is 27.25 kg/m.    ECOG FS:0 - Asymptomatic  General:  Well-developed, well-nourished, no acute distress. Eyes: Pink conjunctiva, anicteric sclera. Lungs: Clear to auscultation bilaterally. Heart: Regular rate and rhythm. No rubs, murmurs, or gallops. Abdomen: Soft, nontender, nondistended. No organomegaly noted, normoactive bowel sounds. Musculoskeletal: No edema, cyanosis, or clubbing. Neuro: Alert, answering all questions appropriately. Cranial nerves grossly intact. Skin: No rashes or petechiae noted. Psych: Normal affect.   LAB RESULTS:  Lab Results  Component Value Date   NA 138 08/24/2012   K 3.8 08/27/2012   CL 106 08/24/2012   CO2 25 08/24/2012   GLUCOSE 80 08/24/2012   BUN 12 08/24/2012   CREATININE 2.40 (H) 04/20/2016   CALCIUM 8.0 (L) 08/24/2012   GFRNONAA >60 08/24/2012   GFRAA >60 08/24/2012    Lab Results  Component Value Date   WBC 7.7 08/03/2016   NEUTROABS 5.3 08/03/2016   HGB 10.8 (L) 08/03/2016   HCT 32.2 (L) 08/03/2016   MCV 94.1 08/03/2016   PLT 372 08/03/2016   Lab Results  Component Value Date   IRON 37 08/03/2016   TIBC 350 08/03/2016   IRONPCTSAT 11 08/03/2016   Lab Results  Component Value Date   FERRITIN 50 08/03/2016     STUDIES: No results found.  ASSESSMENT: Anemia in chronic renal disease, Iron deficiency anemia.  PLAN:    1. Iron deficiency anemia: Patient's hemoglobin remains decreased but is stable at this time. Iron stores are stable and WNL at this time. She does not need IV iron today. Follow-up in 3 months to assess her need for IV iron.  2. Anemia in chronic renal disease: Stable. Monitor. 3. Chronic renal insufficiency: Continued evaluation and treatment per nephrology.  Patient expressed understanding and was in agreement with this plan. She also understands that She can call clinic at any time with any questions, concerns, or complaints.   Faythe Casa, NP 08/03/2016 16:00PM  Patient was seen and evaluated independently and I agree with the assessment and plan as  dictated above. Patient last received Feraheme in September 2017. She does not require Procrit at this time, but would consider treatment in the future if her hemoglobin falls below 10.0.  Lloyd Huger, MD 08/06/16 2:04 PM

## 2016-08-03 ENCOUNTER — Inpatient Hospital Stay: Payer: Medicare Other | Attending: Oncology

## 2016-08-03 ENCOUNTER — Inpatient Hospital Stay (HOSPITAL_BASED_OUTPATIENT_CLINIC_OR_DEPARTMENT_OTHER): Payer: Medicare Other | Admitting: Oncology

## 2016-08-03 ENCOUNTER — Inpatient Hospital Stay: Payer: Medicare Other

## 2016-08-03 VITALS — BP 99/64 | HR 101 | Temp 99.1°F | Resp 18 | Wt 139.6 lb

## 2016-08-03 DIAGNOSIS — R911 Solitary pulmonary nodule: Secondary | ICD-10-CM

## 2016-08-03 DIAGNOSIS — D631 Anemia in chronic kidney disease: Secondary | ICD-10-CM | POA: Diagnosis not present

## 2016-08-03 DIAGNOSIS — R0602 Shortness of breath: Secondary | ICD-10-CM | POA: Insufficient documentation

## 2016-08-03 DIAGNOSIS — Z7982 Long term (current) use of aspirin: Secondary | ICD-10-CM

## 2016-08-03 DIAGNOSIS — G4700 Insomnia, unspecified: Secondary | ICD-10-CM

## 2016-08-03 DIAGNOSIS — Z87891 Personal history of nicotine dependence: Secondary | ICD-10-CM | POA: Diagnosis not present

## 2016-08-03 DIAGNOSIS — E039 Hypothyroidism, unspecified: Secondary | ICD-10-CM | POA: Insufficient documentation

## 2016-08-03 DIAGNOSIS — Z79899 Other long term (current) drug therapy: Secondary | ICD-10-CM

## 2016-08-03 DIAGNOSIS — D509 Iron deficiency anemia, unspecified: Secondary | ICD-10-CM

## 2016-08-03 DIAGNOSIS — I2510 Atherosclerotic heart disease of native coronary artery without angina pectoris: Secondary | ICD-10-CM | POA: Insufficient documentation

## 2016-08-03 DIAGNOSIS — N182 Chronic kidney disease, stage 2 (mild): Secondary | ICD-10-CM | POA: Diagnosis not present

## 2016-08-03 DIAGNOSIS — I129 Hypertensive chronic kidney disease with stage 1 through stage 4 chronic kidney disease, or unspecified chronic kidney disease: Secondary | ICD-10-CM

## 2016-08-03 DIAGNOSIS — I279 Pulmonary heart disease, unspecified: Secondary | ICD-10-CM | POA: Insufficient documentation

## 2016-08-03 DIAGNOSIS — D508 Other iron deficiency anemias: Secondary | ICD-10-CM

## 2016-08-03 DIAGNOSIS — E785 Hyperlipidemia, unspecified: Secondary | ICD-10-CM | POA: Insufficient documentation

## 2016-08-03 DIAGNOSIS — Z809 Family history of malignant neoplasm, unspecified: Secondary | ICD-10-CM | POA: Insufficient documentation

## 2016-08-03 LAB — IRON AND TIBC
Iron: 37 ug/dL (ref 28–170)
SATURATION RATIOS: 11 % (ref 10.4–31.8)
TIBC: 350 ug/dL (ref 250–450)
UIBC: 313 ug/dL

## 2016-08-03 LAB — CBC WITH DIFFERENTIAL/PLATELET
BASOS ABS: 0.1 10*3/uL (ref 0–0.1)
Basophils Relative: 1 %
EOS ABS: 0.2 10*3/uL (ref 0–0.7)
EOS PCT: 3 %
HCT: 32.2 % — ABNORMAL LOW (ref 35.0–47.0)
HEMOGLOBIN: 10.8 g/dL — AB (ref 12.0–16.0)
LYMPHS ABS: 1.6 10*3/uL (ref 1.0–3.6)
LYMPHS PCT: 21 %
MCH: 31.6 pg (ref 26.0–34.0)
MCHC: 33.6 g/dL (ref 32.0–36.0)
MCV: 94.1 fL (ref 80.0–100.0)
Monocytes Absolute: 0.5 10*3/uL (ref 0.2–0.9)
Monocytes Relative: 7 %
NEUTROS PCT: 68 %
Neutro Abs: 5.3 10*3/uL (ref 1.4–6.5)
PLATELETS: 372 10*3/uL (ref 150–440)
RBC: 3.42 MIL/uL — AB (ref 3.80–5.20)
RDW: 15.3 % — ABNORMAL HIGH (ref 11.5–14.5)
WBC: 7.7 10*3/uL (ref 3.6–11.0)

## 2016-08-03 LAB — LACTATE DEHYDROGENASE: LDH: 232 U/L — ABNORMAL HIGH (ref 98–192)

## 2016-08-03 LAB — FERRITIN: FERRITIN: 50 ng/mL (ref 11–307)

## 2016-08-24 DIAGNOSIS — E559 Vitamin D deficiency, unspecified: Secondary | ICD-10-CM | POA: Diagnosis not present

## 2016-08-24 DIAGNOSIS — N184 Chronic kidney disease, stage 4 (severe): Secondary | ICD-10-CM | POA: Diagnosis not present

## 2016-08-24 DIAGNOSIS — I10 Essential (primary) hypertension: Secondary | ICD-10-CM | POA: Diagnosis not present

## 2016-08-31 DIAGNOSIS — I10 Essential (primary) hypertension: Secondary | ICD-10-CM | POA: Diagnosis not present

## 2016-08-31 DIAGNOSIS — E039 Hypothyroidism, unspecified: Secondary | ICD-10-CM | POA: Diagnosis not present

## 2016-08-31 DIAGNOSIS — D509 Iron deficiency anemia, unspecified: Secondary | ICD-10-CM | POA: Diagnosis not present

## 2016-08-31 DIAGNOSIS — E785 Hyperlipidemia, unspecified: Secondary | ICD-10-CM | POA: Diagnosis not present

## 2016-08-31 DIAGNOSIS — J45998 Other asthma: Secondary | ICD-10-CM | POA: Diagnosis not present

## 2016-08-31 DIAGNOSIS — E559 Vitamin D deficiency, unspecified: Secondary | ICD-10-CM | POA: Diagnosis not present

## 2016-08-31 DIAGNOSIS — N184 Chronic kidney disease, stage 4 (severe): Secondary | ICD-10-CM | POA: Diagnosis not present

## 2016-09-29 DIAGNOSIS — M5117 Intervertebral disc disorders with radiculopathy, lumbosacral region: Secondary | ICD-10-CM | POA: Diagnosis not present

## 2016-09-29 DIAGNOSIS — E039 Hypothyroidism, unspecified: Secondary | ICD-10-CM | POA: Diagnosis not present

## 2016-09-29 DIAGNOSIS — Z9981 Dependence on supplemental oxygen: Secondary | ICD-10-CM | POA: Diagnosis not present

## 2016-09-29 DIAGNOSIS — N183 Chronic kidney disease, stage 3 (moderate): Secondary | ICD-10-CM | POA: Diagnosis not present

## 2016-09-29 DIAGNOSIS — M545 Low back pain: Secondary | ICD-10-CM | POA: Diagnosis not present

## 2016-09-29 DIAGNOSIS — I2510 Atherosclerotic heart disease of native coronary artery without angina pectoris: Secondary | ICD-10-CM | POA: Diagnosis not present

## 2016-10-01 ENCOUNTER — Other Ambulatory Visit: Payer: Self-pay | Admitting: Nurse Practitioner

## 2016-10-01 DIAGNOSIS — I2510 Atherosclerotic heart disease of native coronary artery without angina pectoris: Secondary | ICD-10-CM

## 2016-10-04 ENCOUNTER — Ambulatory Visit
Admission: RE | Admit: 2016-10-04 | Discharge: 2016-10-04 | Disposition: A | Discharge status: Home / Self Care | Payer: Medicare Other | Source: Ambulatory Visit | Attending: Nurse Practitioner | Admitting: Nurse Practitioner

## 2016-10-04 DIAGNOSIS — I2510 Atherosclerotic heart disease of native coronary artery without angina pectoris: Secondary | ICD-10-CM | POA: Diagnosis not present

## 2016-10-04 DIAGNOSIS — I517 Cardiomegaly: Secondary | ICD-10-CM | POA: Diagnosis not present

## 2016-10-04 NOTE — Progress Notes (Signed)
*  PRELIMINARY RESULTS* Echocardiogram 2D Echocardiogram has been performed.  Melissa Knox 10/04/2016, 10:36 AM

## 2016-10-13 DIAGNOSIS — Z6827 Body mass index (BMI) 27.0-27.9, adult: Secondary | ICD-10-CM | POA: Diagnosis not present

## 2016-10-13 DIAGNOSIS — M48061 Spinal stenosis, lumbar region without neurogenic claudication: Secondary | ICD-10-CM | POA: Diagnosis not present

## 2016-10-13 DIAGNOSIS — M5416 Radiculopathy, lumbar region: Secondary | ICD-10-CM | POA: Diagnosis not present

## 2016-10-27 DIAGNOSIS — I10 Essential (primary) hypertension: Secondary | ICD-10-CM | POA: Diagnosis not present

## 2016-10-27 DIAGNOSIS — Z9981 Dependence on supplemental oxygen: Secondary | ICD-10-CM | POA: Diagnosis not present

## 2016-10-27 DIAGNOSIS — N183 Chronic kidney disease, stage 3 (moderate): Secondary | ICD-10-CM | POA: Diagnosis not present

## 2016-10-27 DIAGNOSIS — J9611 Chronic respiratory failure with hypoxia: Secondary | ICD-10-CM | POA: Diagnosis not present

## 2016-11-01 ENCOUNTER — Other Ambulatory Visit: Payer: Self-pay

## 2016-11-01 ENCOUNTER — Other Ambulatory Visit: Payer: Self-pay | Admitting: Oncology

## 2016-11-01 DIAGNOSIS — D508 Other iron deficiency anemias: Secondary | ICD-10-CM

## 2016-11-02 ENCOUNTER — Other Ambulatory Visit: Payer: Self-pay

## 2016-11-02 ENCOUNTER — Inpatient Hospital Stay: Payer: Medicare Other

## 2016-11-02 ENCOUNTER — Inpatient Hospital Stay: Payer: Medicare Other | Attending: Oncology | Admitting: Oncology

## 2016-11-02 VITALS — BP 84/57 | HR 103 | Temp 97.9°F | Resp 18 | Wt 140.9 lb

## 2016-11-02 DIAGNOSIS — M545 Low back pain: Secondary | ICD-10-CM | POA: Diagnosis not present

## 2016-11-02 DIAGNOSIS — N182 Chronic kidney disease, stage 2 (mild): Secondary | ICD-10-CM | POA: Diagnosis not present

## 2016-11-02 DIAGNOSIS — Z87891 Personal history of nicotine dependence: Secondary | ICD-10-CM | POA: Diagnosis not present

## 2016-11-02 DIAGNOSIS — I959 Hypotension, unspecified: Secondary | ICD-10-CM | POA: Insufficient documentation

## 2016-11-02 DIAGNOSIS — I2510 Atherosclerotic heart disease of native coronary artery without angina pectoris: Secondary | ICD-10-CM | POA: Insufficient documentation

## 2016-11-02 DIAGNOSIS — E039 Hypothyroidism, unspecified: Secondary | ICD-10-CM | POA: Insufficient documentation

## 2016-11-02 DIAGNOSIS — R0602 Shortness of breath: Secondary | ICD-10-CM | POA: Diagnosis not present

## 2016-11-02 DIAGNOSIS — G4700 Insomnia, unspecified: Secondary | ICD-10-CM | POA: Insufficient documentation

## 2016-11-02 DIAGNOSIS — Z79899 Other long term (current) drug therapy: Secondary | ICD-10-CM | POA: Insufficient documentation

## 2016-11-02 DIAGNOSIS — R531 Weakness: Secondary | ICD-10-CM | POA: Insufficient documentation

## 2016-11-02 DIAGNOSIS — R5383 Other fatigue: Secondary | ICD-10-CM | POA: Diagnosis not present

## 2016-11-02 DIAGNOSIS — I279 Pulmonary heart disease, unspecified: Secondary | ICD-10-CM | POA: Insufficient documentation

## 2016-11-02 DIAGNOSIS — R6881 Early satiety: Secondary | ICD-10-CM | POA: Insufficient documentation

## 2016-11-02 DIAGNOSIS — D631 Anemia in chronic kidney disease: Secondary | ICD-10-CM | POA: Insufficient documentation

## 2016-11-02 DIAGNOSIS — D508 Other iron deficiency anemias: Secondary | ICD-10-CM

## 2016-11-02 DIAGNOSIS — E785 Hyperlipidemia, unspecified: Secondary | ICD-10-CM | POA: Diagnosis not present

## 2016-11-02 DIAGNOSIS — Z7982 Long term (current) use of aspirin: Secondary | ICD-10-CM | POA: Diagnosis not present

## 2016-11-02 DIAGNOSIS — R109 Unspecified abdominal pain: Secondary | ICD-10-CM | POA: Insufficient documentation

## 2016-11-02 DIAGNOSIS — R911 Solitary pulmonary nodule: Secondary | ICD-10-CM | POA: Diagnosis not present

## 2016-11-02 DIAGNOSIS — I129 Hypertensive chronic kidney disease with stage 1 through stage 4 chronic kidney disease, or unspecified chronic kidney disease: Secondary | ICD-10-CM

## 2016-11-02 DIAGNOSIS — Z809 Family history of malignant neoplasm, unspecified: Secondary | ICD-10-CM | POA: Diagnosis not present

## 2016-11-02 DIAGNOSIS — D509 Iron deficiency anemia, unspecified: Secondary | ICD-10-CM | POA: Diagnosis not present

## 2016-11-02 LAB — CBC WITH DIFFERENTIAL/PLATELET
BASOS ABS: 0.1 10*3/uL (ref 0–0.1)
Basophils Relative: 1 %
EOS ABS: 0.2 10*3/uL (ref 0–0.7)
EOS PCT: 3 %
HCT: 32.1 % — ABNORMAL LOW (ref 35.0–47.0)
HEMOGLOBIN: 10.8 g/dL — AB (ref 12.0–16.0)
LYMPHS PCT: 15 %
Lymphs Abs: 1.2 10*3/uL (ref 1.0–3.6)
MCH: 30.2 pg (ref 26.0–34.0)
MCHC: 33.7 g/dL (ref 32.0–36.0)
MCV: 89.4 fL (ref 80.0–100.0)
Monocytes Absolute: 0.5 10*3/uL (ref 0.2–0.9)
Monocytes Relative: 6 %
NEUTROS PCT: 75 %
Neutro Abs: 6.2 10*3/uL (ref 1.4–6.5)
PLATELETS: 397 10*3/uL (ref 150–440)
RBC: 3.59 MIL/uL — AB (ref 3.80–5.20)
RDW: 14.4 % (ref 11.5–14.5)
WBC: 8.2 10*3/uL (ref 3.6–11.0)

## 2016-11-02 LAB — IRON AND TIBC
IRON: 32 ug/dL (ref 28–170)
SATURATION RATIOS: 8 % — AB (ref 10.4–31.8)
TIBC: 384 ug/dL (ref 250–450)
UIBC: 352 ug/dL

## 2016-11-02 LAB — FERRITIN: Ferritin: 24 ng/mL (ref 11–307)

## 2016-11-02 NOTE — Progress Notes (Signed)
No treatment today per MD. Roni Bread

## 2016-11-02 NOTE — Progress Notes (Signed)
[facility]  [phone] [phone]  ID: Melissa Knox OB: Apr 27, 1951  MR#: [id number]  SKA#:[id number]  Patient Care Team: Ronnell Freshwater, NP as PCP - General (Family Medicine)  CHIEF COMPLAINT: Anemia in chronic renal disease, iron deficiency anemia.  INTERVAL HISTORY: Patient returns to clinic today for repeat laboratory work and further evaluation. She has multiple complaints today including abdominal and back pain. She also complains of early satiety. She has difficulty sleeping. She has persistent weakness and fatigue. She has no neurologic complaints. She denies any chest pain or shortness of breath. She denies any nausea, vomiting, constipation, or diarrhea. She has no urinary complaints. Patient offers no further specific complaints today.  REVIEW OF SYSTEMS:   Review of Systems  Constitutional: Positive for malaise/fatigue. Negative for fever and weight loss.  Respiratory: Negative.  Negative for cough, hemoptysis and shortness of breath.   Cardiovascular: Negative.  Negative for chest pain and leg swelling.  Gastrointestinal: Positive for abdominal pain. Negative for blood in stool and melena.  Genitourinary: Negative.   Musculoskeletal: Positive for back pain.  Neurological: Positive for weakness.  Psychiatric/Behavioral: Negative.  The patient is not nervous/anxious.     As per HPI. Otherwise, a complete review of systems is negative.  PAST MEDICAL HISTORY: Past Medical History:  Diagnosis Date  . Atherosclerotic heart disease 06/12/2015  . CKD (chronic kidney disease) stage 2, GFR 60-89 ml/min 06/12/2015  . Hypercalcemia 06/12/2015  . Hyperlipidemia 06/12/2015  . Hypertension 06/12/2015  . Hypothyroidism 06/12/2015  . Insomnia 06/12/2015  . 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 Shortness of breath 06/12/2015  . Solitary pulmonary nodule 06/12/2015    PAST SURGICAL HISTORY: Past Surgical History:  Procedure  Laterality Date  . GALLBLADDER SURGERY  2012    FAMILY HISTORY: Family History  Problem Relation Age of Onset  . Cancer Mother   . Hypertension Father   . Diabetes Father   . Cancer Father        ADVANCED DIRECTIVES (Y/N):  N   HEALTH MAINTENANCE: Social History  Substance Use Topics  . Smoking status: Former Research scientist (life sciences)  . Smokeless tobacco: Never Used  . Alcohol use No     Colonoscopy:  PAP:  Bone density:  Lipid panel:  No Known Allergies  Current Outpatient Prescriptions  Medication Sig Dispense Refill  . albuterol (PROVENTIL) (2.5 MG/3ML) 0.083% nebulizer solution Take 2.5 mg by nebulization every 6 (six) hours as needed for wheezing or shortness of breath.    Marland Kitchen amLODipine (NORVASC) 10 MG tablet Take 10 mg by mouth daily.  12  . aspirin 81 MG tablet Take 81 mg by mouth daily.    Marland Kitchen atorvastatin (LIPITOR) 10 MG tablet Take 10 mg by mouth daily.    . budesonide-formoterol (SYMBICORT) 160-4.5 MCG/ACT inhaler Inhale 2 puffs into the lungs 2 (two) times daily.    . cetirizine (ZYRTEC) 10 MG tablet Take 10 mg by mouth daily.    Marland Kitchen diltiazem (CARDIZEM CD) 180 MG 24 hr capsule Take 180 mg by mouth daily.    . ergocalciferol (VITAMIN D2) 50000 UNITS capsule Take 50,000 Units by mouth once a week.    . hydrALAZINE (APRESOLINE) 50 MG tablet Take 50 mg by mouth 3 (three) times daily.  6  . levothyroxine (SYNTHROID, LEVOTHROID) 50 MCG tablet Take 50 mcg by mouth daily before breakfast.    . losartan (COZAAR) 100 MG tablet Take 100 mg by mouth daily.  3  . meclizine (ANTIVERT) 12.5 MG tablet     .  mirtazapine (REMERON) 15 MG tablet Take 7.5 mg by mouth at bedtime.     . ranitidine (ZANTAC) 150 MG capsule Take 150 mg by mouth 2 (two) times daily.     No current facility-administered medications for this visit.     OBJECTIVE: Vitals:   11/02/16 1341  BP: (!) 84/57  Pulse: (!) 103  Resp: 18  Temp: 97.9 F (36.6 C)     Body mass index is 27.51 kg/m.    ECOG FS:0 -  Asymptomatic  General: Well-developed, well-nourished, no acute distress. Eyes: Pink conjunctiva, anicteric sclera. Lungs: Clear to auscultation bilaterally. Heart: Regular rate and rhythm. No rubs, murmurs, or gallops. Abdomen: Soft, nontender, nondistended. No organomegaly noted, normoactive bowel sounds. Musculoskeletal: No edema, cyanosis, or clubbing. Neuro: Alert, answering all questions appropriately. Cranial nerves grossly intact. Skin: No rashes or petechiae noted. Psych: Normal affect.   LAB RESULTS:  Lab Results  Component Value Date   NA 138 08/24/2012   K 3.8 08/27/2012   CL 106 08/24/2012   CO2 25 08/24/2012   GLUCOSE 80 08/24/2012   BUN 12 08/24/2012   CREATININE 2.40 (H) 04/20/2016   CALCIUM 8.0 (L) 08/24/2012   GFRNONAA >60 08/24/2012   GFRAA >60 08/24/2012    Lab Results  Component Value Date   WBC 8.2 11/02/2016   NEUTROABS 6.2 11/02/2016   HGB 10.8 (L) 11/02/2016   HCT 32.1 (L) 11/02/2016   MCV 89.4 11/02/2016   PLT 397 11/02/2016   Lab Results  Component Value Date   IRON 32 11/02/2016   TIBC 384 11/02/2016   IRONPCTSAT 8 (L) 11/02/2016   Lab Results  Component Value Date   FERRITIN 24 11/02/2016     STUDIES: No results found.  ASSESSMENT: Anemia in chronic renal disease, Iron deficiency anemia.  PLAN:    1. Iron deficiency anemia: Patient's hemoglobin remains decreased, but unchanged. Her iron stores are also decreased, but relatively stable. Patient will likely require IV iron in the near future, but does not wish to have treatment today. Patient last received Feraheme in September 2017. Return to clinic in 3 months with repeat laboratory work, further evaluation, and consideration of IV iron.  2. Anemia in chronic renal disease: Stable. Monitor. Patient's hemoglobin is greater than 0.8, therefore she does not require Procrit. 3. Chronic renal insufficiency: Continued evaluation and treatment per nephrology. 4. Hypotension: Continue  monitoring and treatment per primary care.  Patient expressed understanding and was in agreement with this plan. She also understands that She can call clinic at any time with any questions, concerns, or complaints.    Lloyd Huger, MD 11/06/16 7:06 PM

## 2016-11-02 NOTE — Progress Notes (Signed)
Patient states she has been having pain in her stomach and back.  States she doesn't eat much.  When she does she gets full quickly.  Not sleeping well.  BP 84/57 HR 103  States this has been her baseline since being on Hydrazaline 50 mg.   She was taking 3 tablets a day and now is only taking 2.

## 2016-11-17 DIAGNOSIS — I10 Essential (primary) hypertension: Secondary | ICD-10-CM | POA: Diagnosis not present

## 2016-11-17 DIAGNOSIS — N183 Chronic kidney disease, stage 3 (moderate): Secondary | ICD-10-CM | POA: Diagnosis not present

## 2016-11-17 DIAGNOSIS — D631 Anemia in chronic kidney disease: Secondary | ICD-10-CM | POA: Diagnosis not present

## 2016-11-17 DIAGNOSIS — R809 Proteinuria, unspecified: Secondary | ICD-10-CM | POA: Diagnosis not present

## 2016-12-09 DIAGNOSIS — J449 Chronic obstructive pulmonary disease, unspecified: Secondary | ICD-10-CM | POA: Diagnosis not present

## 2016-12-09 DIAGNOSIS — N390 Urinary tract infection, site not specified: Secondary | ICD-10-CM | POA: Diagnosis not present

## 2016-12-09 DIAGNOSIS — I10 Essential (primary) hypertension: Secondary | ICD-10-CM | POA: Diagnosis not present

## 2016-12-09 DIAGNOSIS — R10811 Right upper quadrant abdominal tenderness: Secondary | ICD-10-CM | POA: Diagnosis not present

## 2016-12-09 DIAGNOSIS — N183 Chronic kidney disease, stage 3 (moderate): Secondary | ICD-10-CM | POA: Diagnosis not present

## 2016-12-27 DIAGNOSIS — R10811 Right upper quadrant abdominal tenderness: Secondary | ICD-10-CM | POA: Diagnosis not present

## 2016-12-27 DIAGNOSIS — N390 Urinary tract infection, site not specified: Secondary | ICD-10-CM | POA: Diagnosis not present

## 2016-12-27 DIAGNOSIS — J449 Chronic obstructive pulmonary disease, unspecified: Secondary | ICD-10-CM | POA: Diagnosis not present

## 2016-12-27 DIAGNOSIS — I10 Essential (primary) hypertension: Secondary | ICD-10-CM | POA: Diagnosis not present

## 2016-12-30 ENCOUNTER — Other Ambulatory Visit: Payer: Self-pay | Admitting: Nurse Practitioner

## 2016-12-30 DIAGNOSIS — R10811 Right upper quadrant abdominal tenderness: Secondary | ICD-10-CM

## 2017-01-05 ENCOUNTER — Ambulatory Visit
Admission: RE | Admit: 2017-01-05 | Discharge: 2017-01-05 | Disposition: A | Discharge status: Home / Self Care | Payer: Medicare Other | Source: Ambulatory Visit | Attending: Nurse Practitioner | Admitting: Nurse Practitioner

## 2017-01-05 DIAGNOSIS — I700 Atherosclerosis of aorta: Secondary | ICD-10-CM | POA: Diagnosis not present

## 2017-01-05 DIAGNOSIS — J8410 Pulmonary fibrosis, unspecified: Secondary | ICD-10-CM | POA: Diagnosis not present

## 2017-01-05 DIAGNOSIS — R590 Localized enlarged lymph nodes: Secondary | ICD-10-CM | POA: Diagnosis not present

## 2017-01-05 DIAGNOSIS — R1011 Right upper quadrant pain: Secondary | ICD-10-CM | POA: Diagnosis not present

## 2017-01-05 DIAGNOSIS — K219 Gastro-esophageal reflux disease without esophagitis: Secondary | ICD-10-CM | POA: Diagnosis not present

## 2017-01-05 DIAGNOSIS — R10811 Right upper quadrant abdominal tenderness: Secondary | ICD-10-CM

## 2017-01-20 DIAGNOSIS — Z9981 Dependence on supplemental oxygen: Secondary | ICD-10-CM | POA: Diagnosis not present

## 2017-01-20 DIAGNOSIS — J449 Chronic obstructive pulmonary disease, unspecified: Secondary | ICD-10-CM | POA: Diagnosis not present

## 2017-01-20 DIAGNOSIS — R1310 Dysphagia, unspecified: Secondary | ICD-10-CM | POA: Diagnosis not present

## 2017-01-20 DIAGNOSIS — R10811 Right upper quadrant abdominal tenderness: Secondary | ICD-10-CM | POA: Diagnosis not present

## 2017-01-20 DIAGNOSIS — I10 Essential (primary) hypertension: Secondary | ICD-10-CM | POA: Diagnosis not present

## 2017-01-20 DIAGNOSIS — D379 Neoplasm of uncertain behavior of digestive organ, unspecified: Secondary | ICD-10-CM | POA: Diagnosis not present

## 2017-01-26 ENCOUNTER — Ambulatory Visit (INDEPENDENT_AMBULATORY_CARE_PROVIDER_SITE_OTHER): Payer: Medicare Other | Admitting: Gastroenterology

## 2017-01-26 ENCOUNTER — Telehealth: Payer: Self-pay

## 2017-01-26 ENCOUNTER — Encounter: Payer: Self-pay | Admitting: Gastroenterology

## 2017-01-26 VITALS — BP 165/55 | HR 116 | Temp 98.1°F | Wt 133.8 lb

## 2017-01-26 DIAGNOSIS — K8689 Other specified diseases of pancreas: Secondary | ICD-10-CM

## 2017-01-26 DIAGNOSIS — K869 Disease of pancreas, unspecified: Secondary | ICD-10-CM

## 2017-01-26 DIAGNOSIS — R634 Abnormal weight loss: Secondary | ICD-10-CM

## 2017-01-26 DIAGNOSIS — R935 Abnormal findings on diagnostic imaging of other abdominal regions, including retroperitoneum: Secondary | ICD-10-CM

## 2017-01-26 DIAGNOSIS — I2510 Atherosclerotic heart disease of native coronary artery without angina pectoris: Secondary | ICD-10-CM

## 2017-01-26 NOTE — Progress Notes (Signed)
Jonathon Bellows MD, MRCP(U.K) [address]  [place]  [place], [place] [address]  Main: [phone]  Fax: [phone]   Gastroenterology Consultation  Referring Provider:     [facility], [facility] Primary Care Physician:  Ronnell Freshwater, NP Primary Gastroenterologist:  Dr. Jonathon Bellows  Reason for Consultation:     Abnormal CT scan         HPI:   Melissa Knox is a 66 y.o. y/o female referred for consultation & management  by Dr. Ronnell Freshwater, NP. She has been referred for an abnormal CT scan .   She recently had a CT scan of the abdomen for RUQ pain - CT abdomen showed a mass like appearance in the ampullary region o fthe pancreas and thickening of the second part of the duodenum . Suspicious for neoplasm , mild retroperitoneal lymphadenopathy noted. No recent LFT's.   She has had some abdominal pain since 08/2016 and recently has got worse, points to the right side of her abdomen , goes to her back, the pain is present all the time. She has lost about 11lbs recently, poor appetite. Last creatinine was 2.40 . She is on oxygen . She is not on any blood thinners except asprin. No family history of pancreatic cancer or pancreatitis. She stopped smoking 5 years back , smoked for many years.    Past Medical History:  Diagnosis Date  . Atherosclerotic heart disease 06/12/2015  . CKD (chronic kidney disease) stage 2, GFR 60-89 ml/min 06/12/2015  . Hypercalcemia 06/12/2015  . Hyperlipidemia 06/12/2015  . Hypertension 06/12/2015  . Hypothyroidism 06/12/2015  . Insomnia 06/12/2015  . 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 Shortness of breath 06/12/2015  . Solitary pulmonary nodule 06/12/2015    Past Surgical History:  Procedure Laterality Date  . GALLBLADDER SURGERY  2012    Prior to Admission medications   Medication Sig Start Date End Date Taking? Authorizing Provider  albuterol (PROVENTIL) (2.5 MG/3ML) 0.083% nebulizer solution Take 2.5 mg by nebulization  every 6 (six) hours as needed for wheezing or shortness of breath.   Yes [provider]  amLODipine (NORVASC) 10 MG tablet Take 10 mg by mouth daily. 04/21/16  Yes [provider]  aspirin 81 MG tablet Take 81 mg by mouth daily.   Yes [provider]  atorvastatin (LIPITOR) 10 MG tablet Take 10 mg by mouth daily.   Yes [provider]  budesonide-formoterol (SYMBICORT) 160-4.5 MCG/ACT inhaler Inhale 2 puffs into the lungs 2 (two) times daily.   Yes [provider]  cetirizine (ZYRTEC) 10 MG tablet Take 10 mg by mouth daily.   Yes [provider]  diltiazem (CARDIZEM CD) 180 MG 24 hr capsule Take 180 mg by mouth daily.   Yes [provider]  ergocalciferol (VITAMIN D2) 50000 UNITS capsule Take 50,000 Units by mouth once a week.   Yes [provider]  hydrALAZINE (APRESOLINE) 50 MG tablet Take 50 mg by mouth 3 (three) times daily. 04/10/16  Yes [provider]  HYDROcodone-acetaminophen (NORCO/VICODIN) 5-325 MG tablet TAKE 1 TABLET BY MOUTH 3 TO 4 TIMES DAILY AS NEEDED FOR PAIN 01/20/17  Yes [provider]  levothyroxine (SYNTHROID, LEVOTHROID) 50 MCG tablet Take 50 mcg by mouth daily before breakfast.   Yes [provider]  losartan (COZAAR) 100 MG tablet Take 100 mg by mouth daily. 02/18/16  Yes [provider]  meclizine (ANTIVERT) 12.5 MG tablet  06/02/16  Yes [provider]  metoCLOPramide (REGLAN) 5 MG tablet TAKE 1 TABLET BY MOUTH TWICE A DAY PRIOR TO BREAKFAST AND DINNER. 01/20/17  Yes [provider]  mirtazapine (REMERON) 15 MG tablet Take 7.5 mg by mouth at bedtime.    Yes [provider]  ranitidine (ZANTAC) 150 MG capsule Take 150 mg by mouth 2 (two) times daily.   Yes [provider]  traMADol (ULTRAM) 50 MG tablet TAKE 1 TABLET BY MOUTH EVERY 4 TO 6 HOURS AS NEEDED FOR PAIN 12/31/16  Yes [provider]    Family History  Problem Relation  Age of Onset  . Cancer Mother   . Hypertension Father   . Diabetes Father   . Cancer Father      Social History  Substance Use Topics  . Smoking status: Former Research scientist (life sciences)  . Smokeless tobacco: Never Used  . Alcohol use No    Allergies as of 01/26/2017  . (No Known Allergies)    Review of Systems:    All systems reviewed and negative except where noted in HPI.   Physical Exam:  BP (!) 165/55   Pulse (!) 116   Temp 98.1 F (36.7 C) (Oral)   Wt 133 lb 12.8 oz (60.7 kg)   BMI 26.13 kg/m  No LMP recorded. Patient is postmenopausal. Psych:  Alert and cooperative. Normal mood and affect. General:   Alert,  Well-developed, well-nourished, pleasant and cooperative in NAD Head:  Normocephalic and atraumatic. Eyes:  Sclera clear, no icterus.   Conjunctiva pink. Ears:  Normal auditory acuity. Nose:  No deformity, discharge, or lesions. Mouth:  No deformity or lesions,oropharynx pink & moist. Neck:  Supple; no masses or thyromegaly. Lungs:  Respirations even and unlabored.  Clear throughout to auscultation.   No wheezes, crackles, or rhonchi. No acute distress. Heart:  Regular rate and rhythm; no murmurs, clicks, rubs, or gallops. Abdomen:  Normal bowel sounds.  No bruits.  Soft, non-tender and non-distended without masses, hepatosplenomegaly or hernias noted.  No guarding or rebound tenderness.    Msk:  Symmetrical without gross deformities. Good, equal movement & strength bilaterally. Pulses:  Normal pulses noted. Extremities:  No clubbing or edema.  No cyanosis. Neurologic:  Alert and oriented x3;  grossly normal neurologically. Psych:  Alert and cooperative. Normal mood and affect.  Imaging Studies: Ct Abdomen Pelvis Wo Contrast  Result Date: 01/05/2017 CLINICAL DATA:  Right upper quadrant pain. EXAM: CT ABDOMEN AND PELVIS WITHOUT CONTRAST TECHNIQUE: Multidetector CT imaging of the abdomen and pelvis was performed following the standard protocol without IV contrast. 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 Gastroesophageal reflux. Hepatobiliary: No focal liver abnormality is seen. Status post cholecystectomy. No biliary dilatation. Pancreas: Masslike appearance of the head of the pancreas with an adjacent asymmetric masslike mucosal thickening of the second portion of the duodenum. Spleen: Normal in size without focal abnormality. Adrenals/Urinary Tract: Adrenal glands are unremarkable. Kidneys are without renal calculi, focal lesion, or hydronephrosis. Possible cortical scarring of the superior right renal pole. Bladder is unremarkable. Stomach/Bowel: Scattered colonic diverticulosis. No evidence of small-bowel obstruction. Vascular/Lymphatic: Severe calcified atherosclerotic disease of the aorta. Shotty retroperitoneal lymph nodes in the upper abdomen. Reproductive: Small versus postsurgical uterus. Other: No abdominal wall hernia or abnormality. No abdominopelvic ascites. Musculoskeletal: Levoconvex scoliosis centered at L3-L4 with associated osteoarthritic changes. 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 Evaluation of the thorax with high-resolution chest CT may be considered if  patient does not have a known diagnosis of interstitial lung disease. 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 The abnormality is incompletely evaluated due to lack of IV contrast, but is suspicious for a neoplastic process, arising from the duodenum or head of the pancreas. Evaluation with abdominal MRI or direct endoscopic evaluation may be considered. Adjacent mild retroperitoneal lymphadenopathy in the upper abdomen. Gastroesophageal reflux. Severe calcific atherosclerosis of the aorta. These results will be called to the  ordering clinician or representative by the Radiologist Assistant, and communication documented in the PACS or zVision Dashboard. Electronically Signed   By: Fidela Salisbury M.D.   On: 01/05/2017 16:59    Assessment and Plan:   Melissa Knox is a 66 y.o. y/o female has been referred for an abnormal CT scan which is suggestive of a pancreatic mass . She has lost weight , ex smoker, has abdominal pain. Concern is for a neoplasm. She cannot have a MRCP as she has poor renal function    Plan  1. Check CMP,INR followed by EUS.+/- FNA  I have discussed alternative options, risks & benefits,  which include, but are not limited to, bleeding, infection, perforation,respiratory complication & drug reaction.  The patient agrees with this plan & written consent will be obtained.    Follow up in 3 months  Dr Jonathon Bellows MD,MRCP(U.K)

## 2017-01-26 NOTE — Telephone Encounter (Signed)
  Oncology Nurse Navigator Documentation Received referral from [name] for EUS/FNA for abnormal abdominal CT. No upcoming availability at [facility] . I have spoken to Melissa Knox and she is agreeable to have EUS at [facility] to expedite biopsy. I will send her information to [facility] and they will contact her with date/time/instructions. Navigator Location: [facility] (01/26/17 1500)   )Navigator Encounter Type: Telephone (01/26/17 1500) Telephone: Outgoing Call (01/26/17 1500)                       Barriers/Navigation Needs: Coordination of Care (01/26/17 1500)   Interventions: Coordination of Care (01/26/17 1500)   Coordination of Care: EUS (01/26/17 1500)        Acuity: Level 2 (01/26/17 1500)   Acuity Level 2: Initial guidance, education and coordination as needed;Educational needs;Assistance expediting appointments;Ongoing guidance and education throughout treatment as needed (01/26/17 1500)     Time Spent with Patient: 30 (01/26/17 1500)

## 2017-01-27 NOTE — Progress Notes (Signed)
  Oncology Nurse Navigator Documentation scheduled for EUS with [name] on Tuesday, 6/5, at 10:15 am  Navigator Location: [facility] (01/27/17 0900)   )Navigator Encounter Type: Letter/Fax/Email (01/27/17 0900)                                 Coordination of Care: EUS (01/27/17 0900)                  Time Spent with Patient: 15 (01/27/17 0900)

## 2017-01-28 DIAGNOSIS — J8410 Pulmonary fibrosis, unspecified: Secondary | ICD-10-CM | POA: Diagnosis not present

## 2017-01-28 DIAGNOSIS — I2510 Atherosclerotic heart disease of native coronary artery without angina pectoris: Secondary | ICD-10-CM | POA: Diagnosis not present

## 2017-01-28 DIAGNOSIS — I10 Essential (primary) hypertension: Secondary | ICD-10-CM | POA: Diagnosis not present

## 2017-01-28 DIAGNOSIS — N182 Chronic kidney disease, stage 2 (mild): Secondary | ICD-10-CM | POA: Diagnosis not present

## 2017-01-28 DIAGNOSIS — R1011 Right upper quadrant pain: Secondary | ICD-10-CM | POA: Diagnosis not present

## 2017-01-28 DIAGNOSIS — J449 Chronic obstructive pulmonary disease, unspecified: Secondary | ICD-10-CM | POA: Diagnosis not present

## 2017-02-01 DIAGNOSIS — Z7982 Long term (current) use of aspirin: Secondary | ICD-10-CM | POA: Diagnosis not present

## 2017-02-01 DIAGNOSIS — K269 Duodenal ulcer, unspecified as acute or chronic, without hemorrhage or perforation: Secondary | ICD-10-CM | POA: Diagnosis not present

## 2017-02-01 DIAGNOSIS — Z79899 Other long term (current) drug therapy: Secondary | ICD-10-CM | POA: Diagnosis not present

## 2017-02-01 DIAGNOSIS — R1011 Right upper quadrant pain: Secondary | ICD-10-CM | POA: Diagnosis not present

## 2017-02-01 DIAGNOSIS — J449 Chronic obstructive pulmonary disease, unspecified: Secondary | ICD-10-CM | POA: Diagnosis not present

## 2017-02-01 DIAGNOSIS — I129 Hypertensive chronic kidney disease with stage 1 through stage 4 chronic kidney disease, or unspecified chronic kidney disease: Secondary | ICD-10-CM | POA: Diagnosis not present

## 2017-02-01 DIAGNOSIS — J8410 Pulmonary fibrosis, unspecified: Secondary | ICD-10-CM | POA: Diagnosis not present

## 2017-02-01 DIAGNOSIS — K222 Esophageal obstruction: Secondary | ICD-10-CM | POA: Diagnosis not present

## 2017-02-01 DIAGNOSIS — R933 Abnormal findings on diagnostic imaging of other parts of digestive tract: Secondary | ICD-10-CM | POA: Diagnosis not present

## 2017-02-01 DIAGNOSIS — N189 Chronic kidney disease, unspecified: Secondary | ICD-10-CM | POA: Diagnosis not present

## 2017-02-02 ENCOUNTER — Other Ambulatory Visit: Payer: Medicare Other

## 2017-02-02 ENCOUNTER — Ambulatory Visit: Payer: Medicare Other | Admitting: Oncology

## 2017-02-02 ENCOUNTER — Ambulatory Visit: Payer: Medicare Other

## 2017-02-02 NOTE — Progress Notes (Signed)
  Oncology Nurse Navigator Documentation EUS report from 6/5 faxed to Dr. Georgeann Oppenheim office. Pathology pending. Navigator Location: [facility] (02/02/17 1500)   )Navigator Encounter Type: Letter/Fax/Email (02/02/17 1500)                                 Coordination of Care: EUS (02/02/17 1500)                  Time Spent with Patient: 15 (02/02/17 1500)

## 2017-02-06 ENCOUNTER — Encounter: Payer: Self-pay | Admitting: Emergency Medicine

## 2017-02-06 ENCOUNTER — Inpatient Hospital Stay
Admission: EM | Admit: 2017-02-06 | Discharge: 2017-02-08 | DRG: 394 | Disposition: A | Discharge status: Home / Self Care | Payer: Medicare Other | Attending: Internal Medicine | Admitting: Internal Medicine

## 2017-02-06 DIAGNOSIS — Z9049 Acquired absence of other specified parts of digestive tract: Secondary | ICD-10-CM

## 2017-02-06 DIAGNOSIS — R531 Weakness: Secondary | ICD-10-CM | POA: Diagnosis not present

## 2017-02-06 DIAGNOSIS — E785 Hyperlipidemia, unspecified: Secondary | ICD-10-CM | POA: Diagnosis present

## 2017-02-06 DIAGNOSIS — E039 Hypothyroidism, unspecified: Secondary | ICD-10-CM | POA: Diagnosis present

## 2017-02-06 DIAGNOSIS — K922 Gastrointestinal hemorrhage, unspecified: Secondary | ICD-10-CM

## 2017-02-06 DIAGNOSIS — I129 Hypertensive chronic kidney disease with stage 1 through stage 4 chronic kidney disease, or unspecified chronic kidney disease: Secondary | ICD-10-CM | POA: Diagnosis present

## 2017-02-06 DIAGNOSIS — N183 Chronic kidney disease, stage 3 (moderate): Secondary | ICD-10-CM | POA: Diagnosis present

## 2017-02-06 DIAGNOSIS — D62 Acute posthemorrhagic anemia: Secondary | ICD-10-CM | POA: Diagnosis not present

## 2017-02-06 DIAGNOSIS — Z6825 Body mass index (BMI) 25.0-25.9, adult: Secondary | ICD-10-CM

## 2017-02-06 DIAGNOSIS — N179 Acute kidney failure, unspecified: Secondary | ICD-10-CM | POA: Diagnosis present

## 2017-02-06 DIAGNOSIS — Z833 Family history of diabetes mellitus: Secondary | ICD-10-CM | POA: Diagnosis not present

## 2017-02-06 DIAGNOSIS — I279 Pulmonary heart disease, unspecified: Secondary | ICD-10-CM | POA: Diagnosis present

## 2017-02-06 DIAGNOSIS — Z87891 Personal history of nicotine dependence: Secondary | ICD-10-CM | POA: Diagnosis not present

## 2017-02-06 DIAGNOSIS — K559 Vascular disorder of intestine, unspecified: Principal | ICD-10-CM | POA: Diagnosis present

## 2017-02-06 DIAGNOSIS — K869 Disease of pancreas, unspecified: Secondary | ICD-10-CM | POA: Diagnosis present

## 2017-02-06 DIAGNOSIS — R809 Proteinuria, unspecified: Secondary | ICD-10-CM | POA: Diagnosis not present

## 2017-02-06 DIAGNOSIS — D649 Anemia, unspecified: Secondary | ICD-10-CM

## 2017-02-06 DIAGNOSIS — J449 Chronic obstructive pulmonary disease, unspecified: Secondary | ICD-10-CM | POA: Diagnosis present

## 2017-02-06 DIAGNOSIS — K269 Duodenal ulcer, unspecified as acute or chronic, without hemorrhage or perforation: Secondary | ICD-10-CM | POA: Diagnosis present

## 2017-02-06 DIAGNOSIS — R109 Unspecified abdominal pain: Secondary | ICD-10-CM

## 2017-02-06 DIAGNOSIS — E440 Moderate protein-calorie malnutrition: Secondary | ICD-10-CM | POA: Diagnosis present

## 2017-02-06 DIAGNOSIS — D631 Anemia in chronic kidney disease: Secondary | ICD-10-CM | POA: Diagnosis present

## 2017-02-06 DIAGNOSIS — Z79899 Other long term (current) drug therapy: Secondary | ICD-10-CM

## 2017-02-06 DIAGNOSIS — Z7951 Long term (current) use of inhaled steroids: Secondary | ICD-10-CM | POA: Diagnosis not present

## 2017-02-06 DIAGNOSIS — Z7982 Long term (current) use of aspirin: Secondary | ICD-10-CM | POA: Diagnosis not present

## 2017-02-06 DIAGNOSIS — K8689 Other specified diseases of pancreas: Secondary | ICD-10-CM

## 2017-02-06 DIAGNOSIS — Z8249 Family history of ischemic heart disease and other diseases of the circulatory system: Secondary | ICD-10-CM | POA: Diagnosis not present

## 2017-02-06 DIAGNOSIS — R55 Syncope and collapse: Secondary | ICD-10-CM | POA: Diagnosis present

## 2017-02-06 DIAGNOSIS — Z8711 Personal history of peptic ulcer disease: Secondary | ICD-10-CM

## 2017-02-06 DIAGNOSIS — I2510 Atherosclerotic heart disease of native coronary artery without angina pectoris: Secondary | ICD-10-CM | POA: Diagnosis present

## 2017-02-06 DIAGNOSIS — R1084 Generalized abdominal pain: Secondary | ICD-10-CM | POA: Diagnosis not present

## 2017-02-06 LAB — URINALYSIS, COMPLETE (UACMP) WITH MICROSCOPIC
BACTERIA UA: NONE SEEN
Bilirubin Urine: NEGATIVE
Glucose, UA: NEGATIVE mg/dL
Hgb urine dipstick: NEGATIVE
KETONES UR: NEGATIVE mg/dL
Nitrite: NEGATIVE
PROTEIN: NEGATIVE mg/dL
Specific Gravity, Urine: 1.009 (ref 1.005–1.030)
pH: 5 (ref 5.0–8.0)

## 2017-02-06 LAB — COMPREHENSIVE METABOLIC PANEL
ALBUMIN: 3.2 g/dL — AB (ref 3.5–5.0)
ALT: 13 U/L — ABNORMAL LOW (ref 14–54)
AST: 17 U/L (ref 15–41)
Alkaline Phosphatase: 59 U/L (ref 38–126)
Anion gap: 10 (ref 5–15)
BUN: 22 mg/dL — AB (ref 6–20)
CO2: 22 mmol/L (ref 22–32)
Calcium: 10.5 mg/dL — ABNORMAL HIGH (ref 8.9–10.3)
Chloride: 102 mmol/L (ref 101–111)
Creatinine, Ser: 2.36 mg/dL — ABNORMAL HIGH (ref 0.44–1.00)
GFR calc Af Amer: 24 mL/min — ABNORMAL LOW (ref >60)
GFR, EST NON AFRICAN AMERICAN: 20 mL/min — AB (ref >60)
Glucose, Bld: 119 mg/dL — ABNORMAL HIGH (ref 65–99)
Potassium: 3.2 mmol/L — ABNORMAL LOW (ref 3.5–5.1)
SODIUM: 134 mmol/L — AB (ref 135–145)
Total Bilirubin: 0.3 mg/dL (ref 0.3–1.2)
Total Protein: 7.7 g/dL (ref 6.5–8.1)

## 2017-02-06 LAB — CBC
HEMATOCRIT: 22.4 % — AB (ref 35.0–47.0)
Hemoglobin: 7.4 g/dL — ABNORMAL LOW (ref 12.0–16.0)
MCH: 27.7 pg (ref 26.0–34.0)
MCHC: 33 g/dL (ref 32.0–36.0)
MCV: 83.9 fL (ref 80.0–100.0)
Platelets: 531 10*3/uL — ABNORMAL HIGH (ref 150–440)
RBC: 2.66 MIL/uL — AB (ref 3.80–5.20)
RDW: 17 % — AB (ref 11.5–14.5)
WBC: 8.6 10*3/uL (ref 3.6–11.0)

## 2017-02-06 LAB — PROTIME-INR
INR: 1.22
Prothrombin Time: 15.5 seconds — ABNORMAL HIGH (ref 11.4–15.2)

## 2017-02-06 LAB — ABO/RH: ABO/RH(D): A POS

## 2017-02-06 LAB — BILIRUBIN, DIRECT

## 2017-02-06 LAB — PREPARE RBC (CROSSMATCH)

## 2017-02-06 LAB — LIPASE, BLOOD: Lipase: 29 U/L (ref 11–51)

## 2017-02-06 LAB — HEMOGLOBIN: HEMOGLOBIN: 6.8 g/dL — AB (ref 12.0–16.0)

## 2017-02-06 MED ORDER — VITAMIN D (ERGOCALCIFEROL) 1.25 MG (50000 UNIT) PO CAPS
50000.0000 [IU] | ORAL_CAPSULE | ORAL | Status: DC
  Administered 2017-02-07: 50000 [IU] via ORAL
  Filled 2017-02-06: qty 1

## 2017-02-06 MED ORDER — MIRTAZAPINE 15 MG PO TABS
7.5000 mg | ORAL_TABLET | Freq: Every day | ORAL | Status: DC
  Administered 2017-02-06 – 2017-02-07 (×2): 7.5 mg via ORAL
  Filled 2017-02-06 (×2): qty 1

## 2017-02-06 MED ORDER — SODIUM CHLORIDE 0.9 % IV BOLUS (SEPSIS)
1000.0000 mL | Freq: Once | INTRAVENOUS | Status: AC
  Administered 2017-02-06: 1000 mL via INTRAVENOUS

## 2017-02-06 MED ORDER — LOSARTAN POTASSIUM 50 MG PO TABS
50.0000 mg | ORAL_TABLET | Freq: Every day | ORAL | Status: DC
  Administered 2017-02-07 – 2017-02-08 (×2): 50 mg via ORAL
  Filled 2017-02-06 (×2): qty 1

## 2017-02-06 MED ORDER — HYDROCODONE-ACETAMINOPHEN 5-325 MG PO TABS
1.0000 | ORAL_TABLET | Freq: Four times a day (QID) | ORAL | Status: DC | PRN
  Administered 2017-02-06: 16:00:00 1 via ORAL
  Administered 2017-02-07 – 2017-02-08 (×3): 2 via ORAL
  Filled 2017-02-06: qty 2
  Filled 2017-02-06: qty 1
  Filled 2017-02-06 (×2): qty 2

## 2017-02-06 MED ORDER — ATORVASTATIN CALCIUM 20 MG PO TABS
10.0000 mg | ORAL_TABLET | Freq: Every day | ORAL | Status: DC
  Administered 2017-02-06 – 2017-02-07 (×2): 10 mg via ORAL
  Filled 2017-02-06 (×2): qty 1

## 2017-02-06 MED ORDER — DILTIAZEM HCL ER COATED BEADS 180 MG PO CP24
180.0000 mg | ORAL_CAPSULE | Freq: Every day | ORAL | Status: DC
  Administered 2017-02-07 – 2017-02-08 (×2): 180 mg via ORAL
  Filled 2017-02-06 (×2): qty 1

## 2017-02-06 MED ORDER — ACETAMINOPHEN 325 MG PO TABS: 650.0000 mg | ORAL_TABLET | Freq: Four times a day (QID) | ORAL | Status: DC | PRN

## 2017-02-06 MED ORDER — HYDRALAZINE HCL 50 MG PO TABS
50.0000 mg | ORAL_TABLET | Freq: Two times a day (BID) | ORAL | Status: DC
  Administered 2017-02-06 – 2017-02-08 (×4): 50 mg via ORAL
  Filled 2017-02-06 (×4): qty 1

## 2017-02-06 MED ORDER — ONDANSETRON HCL 4 MG/2ML IJ SOLN
4.0000 mg | Freq: Once | INTRAMUSCULAR | Status: AC
  Administered 2017-02-06: 4 mg via INTRAVENOUS
  Filled 2017-02-06: qty 2

## 2017-02-06 MED ORDER — POTASSIUM CHLORIDE IN NACL 20-0.9 MEQ/L-% IV SOLN
INTRAVENOUS | Status: DC
  Administered 2017-02-06 – 2017-02-08 (×4): via INTRAVENOUS
  Filled 2017-02-06 (×8): qty 1000

## 2017-02-06 MED ORDER — DOCUSATE SODIUM 100 MG PO CAPS
100.0000 mg | ORAL_CAPSULE | Freq: Two times a day (BID) | ORAL | Status: DC
  Administered 2017-02-06 – 2017-02-08 (×4): 100 mg via ORAL
  Filled 2017-02-06 (×4): qty 1

## 2017-02-06 MED ORDER — ONDANSETRON HCL 4 MG/2ML IJ SOLN: 4.0000 mg | Freq: Four times a day (QID) | INTRAMUSCULAR | Status: DC | PRN

## 2017-02-06 MED ORDER — LORATADINE 10 MG PO TABS
10.0000 mg | ORAL_TABLET | Freq: Every day | ORAL | Status: DC
  Administered 2017-02-06 – 2017-02-08 (×3): 10 mg via ORAL
  Filled 2017-02-06 (×3): qty 1

## 2017-02-06 MED ORDER — ACETAMINOPHEN 650 MG RE SUPP: 650.0000 mg | Freq: Four times a day (QID) | RECTAL | Status: DC | PRN

## 2017-02-06 MED ORDER — PANTOPRAZOLE SODIUM 40 MG IV SOLR
40.0000 mg | Freq: Two times a day (BID) | INTRAVENOUS | Status: DC
Start: 2017-02-06 — End: 2017-02-08
  Administered 2017-02-06 – 2017-02-08 (×4): 40 mg via INTRAVENOUS
  Filled 2017-02-06 (×4): qty 40

## 2017-02-06 MED ORDER — METOCLOPRAMIDE HCL 10 MG PO TABS
5.0000 mg | ORAL_TABLET | Freq: Three times a day (TID) | ORAL | Status: DC
  Administered 2017-02-06 – 2017-02-08 (×5): 5 mg via ORAL
  Filled 2017-02-06 (×5): qty 1

## 2017-02-06 MED ORDER — LEVOTHYROXINE SODIUM 50 MCG PO TABS
50.0000 ug | ORAL_TABLET | Freq: Every day | ORAL | Status: DC
Start: 2017-02-07 — End: 2017-02-08
  Administered 2017-02-07 – 2017-02-08 (×2): 50 ug via ORAL
  Filled 2017-02-06 (×2): qty 1

## 2017-02-06 MED ORDER — SODIUM CHLORIDE 0.9 % IV SOLN
Freq: Once | INTRAVENOUS | Status: AC
  Administered 2017-02-06: via INTRAVENOUS

## 2017-02-06 MED ORDER — SODIUM CHLORIDE 0.9 % IV SOLN
80.0000 mg | Freq: Once | INTRAVENOUS | Status: AC
  Administered 2017-02-06: 13:00:00 80 mg via INTRAVENOUS

## 2017-02-06 MED ORDER — BISACODYL 10 MG RE SUPP
10.0000 mg | Freq: Every day | RECTAL | Status: DC | PRN
  Filled 2017-02-06: qty 1

## 2017-02-06 MED ORDER — MOMETASONE FURO-FORMOTEROL FUM 200-5 MCG/ACT IN AERO
2.0000 | INHALATION_SPRAY | Freq: Two times a day (BID) | RESPIRATORY_TRACT | Status: DC
  Administered 2017-02-06 – 2017-02-08 (×4): 2 via RESPIRATORY_TRACT
  Filled 2017-02-06: qty 8.8

## 2017-02-06 MED ORDER — SUCRALFATE 1 G PO TABS
1.0000 g | ORAL_TABLET | Freq: Three times a day (TID) | ORAL | Status: DC
  Administered 2017-02-06 – 2017-02-08 (×7): 1 g via ORAL
  Filled 2017-02-06 (×7): qty 1

## 2017-02-06 MED ORDER — ONDANSETRON HCL 4 MG PO TABS: 4.0000 mg | ORAL_TABLET | Freq: Four times a day (QID) | ORAL | Status: DC | PRN

## 2017-02-06 MED ORDER — FAMOTIDINE 20 MG PO TABS
20.0000 mg | ORAL_TABLET | Freq: Two times a day (BID) | ORAL | Status: DC
  Administered 2017-02-06 – 2017-02-07 (×2): 20 mg via ORAL
  Filled 2017-02-06 (×2): qty 1

## 2017-02-06 MED ORDER — IPRATROPIUM-ALBUTEROL 0.5-2.5 (3) MG/3ML IN SOLN
3.0000 mL | Freq: Four times a day (QID) | RESPIRATORY_TRACT | Status: DC
  Administered 2017-02-06: 16:00:00 3 mL via RESPIRATORY_TRACT
  Filled 2017-02-06: qty 3

## 2017-02-06 MED ORDER — SODIUM CHLORIDE 0.9 % IV SOLN
80.0000 mg | Freq: Once | INTRAVENOUS | Status: DC
  Filled 2017-02-06: qty 80

## 2017-02-06 NOTE — Progress Notes (Signed)
Dr Doy Hutching made aware of Hgb 6.8, states that he will place orders for blood transfusion

## 2017-02-06 NOTE — ED Triage Notes (Signed)
Pt brought in by [facility] from home for 1 week of generalized weakness, dizziness, and abdominal pain. EMS reports that husband informed them that over the past week when patient stands up she has syncopal episodes. Pt reports that she had 1 episode of dark diarrhea on Thursday and has not been able to have a bowel movement since.   Pt reports that she was recently admitted to [facility] and diagnosed with an infection in or around her pancrease and an ulcer, pt states that this was diagnosed by CT.  EMS reports that pt was orthostatic with them, blood pressure was 147 palpated when sitting and dropped to 100 palpated when standing. Pt also informed this RN that she has been having episode when she feel like she cannot use her left arm, pt reports feeling more weak on her left side than the right, pt reports that this has been going on for 1 week.

## 2017-02-06 NOTE — ED Provider Notes (Signed)
[facility] Emergency Department Provider Note  ____________________________________________   First MD Initiated Contact with Patient 02/06/17 1135     (approximate)  I have reviewed the triage vital signs and the nursing notes.   HISTORY  Chief Complaint Dizziness; Weakness; and Abdominal Pain    HPI Melissa Knox is a 66 y.o. female who self presents to the emergency department with several days of generalized fatigue up her abdominal discomfort and weakness. She says she has no chest pain or shortness of breath. She says she generally feels rundown and tired. 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 She has been worked up by [name] of gastroenterology here who recently referred her to [facility] and 4 days ago she had an endoscopy and a biopsy which showed duodenal ulcer but no signs of malignancy. She has not yet had a biopsy of her pancreatic mass. She denies headache double vision blurred vision numbness or focal weakness. She denies chest pain or shortness of breath. She has never had a blood transfusion in the past. She continues to have epigastric burning aching discomfort. Non-radiating. She had low blood pressure and was tachycardic en route but was given IV fluids which seemed to help.   Past Medical History:  Diagnosis Date  . Atherosclerotic heart disease 06/12/2015  . CKD (chronic kidney disease) stage 2, GFR 60-89 ml/min 06/12/2015  . Hypercalcemia 06/12/2015  . Hyperlipidemia 06/12/2015  . Hypertension 06/12/2015  . Hypothyroidism 06/12/2015  . Insomnia 06/12/2015  . 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 Shortness of breath 06/12/2015  . Solitary pulmonary nodule 06/12/2015    Patient Active Problem List   Diagnosis Date Noted  . Iron deficiency anemia  05/01/2016  . Anemia in chronic renal disease 04/26/2016  . Insomnia 06/12/2015  . CKD (chronic kidney disease) stage 2, GFR 60-89 ml/min 06/12/2015  . Hypercalcemia 06/12/2015  . Hypothyroidism 06/12/2015  . 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 Hyperlipidemia 06/12/2015  . Hypertension 06/12/2015  . Atherosclerotic heart disease 06/12/2015  . Solitary pulmonary nodule 06/12/2015  . Shortness of breath 06/12/2015  . COPD (chronic obstructive pulmonary disease) ([place]) 06/12/2015    Past Surgical History:  Procedure Laterality Date  . GALLBLADDER SURGERY  2012    Prior to Admission medications   Medication Sig Start Date End Date Taking? Authorizing Provider  albuterol (PROVENTIL) (2.5 MG/3ML) 0.083% nebulizer solution Take 2.5 mg by nebulization every 6 (six) hours as needed for wheezing or shortness of breath.    [provider]  amLODipine (NORVASC) 10 MG tablet Take 10 mg by mouth daily. 04/21/16   [provider]  aspirin 81 MG tablet Take 81 mg by mouth daily.    [provider]  atorvastatin (LIPITOR) 10 MG tablet Take 10 mg by mouth daily.    [provider]  budesonide-formoterol (SYMBICORT) 160-4.5 MCG/ACT inhaler Inhale 2 puffs into the lungs 2 (two) times daily.    [provider]  cetirizine (ZYRTEC) 10 MG tablet Take 10 mg by mouth daily.    [provider]  diltiazem (CARDIZEM CD) 180 MG 24 hr capsule Take 180 mg by mouth daily.    [provider]  ergocalciferol (VITAMIN D2) 50000 UNITS capsule Take 50,000 Units by mouth once a week.    [provider]  hydrALAZINE (APRESOLINE)  50 MG tablet Take 50 mg by mouth 3 (three) times daily. 04/10/16   [provider]  HYDROcodone-acetaminophen (NORCO/VICODIN) 5-325 MG tablet TAKE 1 TABLET BY MOUTH 3 TO 4 TIMES DAILY AS NEEDED FOR PAIN 01/20/17   [provider]  levothyroxine (SYNTHROID, LEVOTHROID) 50 MCG tablet Take 50 mcg by mouth  daily before breakfast.    [provider]  losartan (COZAAR) 100 MG tablet Take 100 mg by mouth daily. 02/18/16   [provider]  meclizine (ANTIVERT) 12.5 MG tablet  06/02/16   [provider]  metoCLOPramide (REGLAN) 5 MG tablet TAKE 1 TABLET BY MOUTH TWICE A DAY PRIOR TO BREAKFAST AND DINNER. 01/20/17   [provider]  mirtazapine (REMERON) 15 MG tablet Take 7.5 mg by mouth at bedtime.     [provider]  ranitidine (ZANTAC) 150 MG capsule Take 150 mg by mouth 2 (two) times daily.    [provider]  traMADol (ULTRAM) 50 MG tablet TAKE 1 TABLET BY MOUTH EVERY 4 TO 6 HOURS AS NEEDED FOR PAIN 12/31/16   [provider]    Allergies Patient has no known allergies.  Family History  Problem Relation Age of Onset  . Cancer Mother   . Hypertension Father   . Diabetes Father   . Cancer Father     Social History Social History  Substance Use Topics  . Smoking status: Former Research scientist (life sciences)  . Smokeless tobacco: Never Used  . Alcohol use No    Review of Systems Constitutional: No fever/chills Eyes: No visual changes. ENT: No sore throat. Cardiovascular: Denies chest pain. Respiratory: Denies shortness of breath. Gastrointestinal: Positive abdominal pain.  Positive nausea, no vomiting.  No diarrhea.  No constipation. Genitourinary: Negative for dysuria. Musculoskeletal: Negative for back pain. Skin: Negative for rash. Neurological: Negative for headaches, focal weakness or numbness.   ____________________________________________   PHYSICAL EXAM:  VITAL SIGNS: ED Triage Vitals  Enc Vitals Group     BP 02/06/17 1047 (!) 149/47     Pulse Rate 02/06/17 1047 (!) 108     Resp 02/06/17 1047 17     Temp 02/06/17 1047 98.1 F (36.7 C)     Temp Source 02/06/17 1047 Oral     SpO2 02/06/17 1047 100 %     Weight 02/06/17 1048 135 lb 5.8 oz (61.4 kg)     Height --      Head Circumference --      Peak Flow --      Pain Score --       Pain Loc --      Pain Edu? --      Excl. in [place]? --     Constitutional: Alert and oriented x 4 Appears uncomfortable curled on her side in pain Eyes: PERRL EOMI. Head: Atraumatic. Nose: No congestion/rhinnorhea. Mouth/Throat: No trismus Neck: No stridor.   Cardiovascular: Tachycardic rate, regular rhythm. Grossly normal heart sounds.  Good peripheral circulation. Respiratory: Normal respiratory effort.  No retractions. Lungs CTAB and moving good air Gastrointestinal: Soft diffuse tenderness without focality no peritonitis Rectal exam chaperoned by female nurse: Guaiac positive control positive brown stool Musculoskeletal: No lower extremity edema   Neurologic:  Normal speech and language. No gross focal neurologic deficits are appreciated. Skin:  Skin is warm, dry and intact. No rash noted. Psychiatric: Mood and affect are normal. Speech and behavior are normal.    ____________________________________________   DIFFERENTIAL  Upper GI bleed, lower GI bleed, pancreatic cancer, metabolic derangement ____________________________________________  LABS (all labs ordered are listed, but only abnormal results are displayed)  Labs Reviewed  COMPREHENSIVE METABOLIC PANEL - Abnormal; Notable for the following:       Result Value   Sodium 134 (*)    Potassium 3.2 (*)    Glucose, Bld 119 (*)    BUN 22 (*)    Creatinine, Ser 2.36 (*)    Calcium 10.5 (*)    Albumin 3.2 (*)    ALT 13 (*)    GFR calc non Af Amer 20 (*)    GFR calc Af Amer 24 (*)    All other components within normal limits  CBC - Abnormal; Notable for the following:    RBC 2.66 (*)    Hemoglobin 7.4 (*)    HCT 22.4 (*)    RDW 17.0 (*)    Platelets 531 (*)    All other components within normal limits  LIPASE, BLOOD  URINALYSIS, COMPLETE (UACMP) WITH MICROSCOPIC  PROTIME-INR  BILIRUBIN, DIRECT  CBG MONITORING, ED  TYPE AND SCREEN    Hemoglobin down to 7.4 with elevated RDW likely secondary to iron  deficiency __________________________________________  EKG  ED ECG REPORT I, Darel Hong, the attending physician, personally viewed and interpreted this ECG.  Date: 02/06/2017 Rate: 104 Rhythm: Sinus tachycardia QRS Axis: normal Intervals: normal ST/T Wave abnormalities: normal Conduction Disturbances: none Narrative Interpretation: unremarkable  ____________________________________________  RADIOLOGY   ____________________________________________   PROCEDURES  Procedure(s) performed: no  Procedures  Critical Care performed: yes  CRITICAL CARE Performed by: Darel Hong   Total critical care time: 35 minutes  Critical care time was exclusive of separately billable procedures and treating other patients.  Critical care was necessary to treat or prevent imminent or life-threatening deterioration.  Critical care was time spent personally by me on the following activities: development of treatment plan with patient and/or surrogate as well as nursing, discussions with consultants, evaluation of patient's response to treatment, examination of patient, obtaining history from patient or surrogate, ordering and performing treatments and interventions, ordering and review of laboratory studies, ordering and review of radiographic studies, pulse oximetry and re-evaluation of patient's condition.   Observation: no ____________________________________________   INITIAL IMPRESSION / ASSESSMENT AND PLAN / ED COURSE  Pertinent labs & imaging results that were available during my care of the patient were reviewed by me and considered in my medical decision making (see chart for details).  The patient arrives uncomfortable appearing and tachycardic. Fortunately her blood pressure here in the emergency department is normal. Her hemoglobin came back at 7.4 with a high RDW which is concerning for blood loss and she is guaiac positive although with brown stool. Recent  endoscopy confirms a duodenal ulcer which may be the etiology of her bleed. At this point as she is symptomatic and keeps passing out she will require inpatient admission for likely type-specific blood and further workup of her fatigue.      ____________________________________________   FINAL CLINICAL IMPRESSION(S) / ED DIAGNOSES  Final diagnoses:  Symptomatic anemia      NEW MEDICATIONS STARTED DURING THIS VISIT:  New Prescriptions   No medications on file     Note:  This document was prepared using Dragon voice recognition software and may include unintentional dictation errors.     Darel Hong, MD 02/06/17 1342

## 2017-02-06 NOTE — ED Notes (Signed)
Dr. Doy Hutching at patient bedside

## 2017-02-06 NOTE — H&P (Signed)
History and Physical    Melissa Knox HFW:[id number] DOB: 1950/09/21 DOA: 02/06/2017  Referring physician: Dr. Mable Paris PCP: Ronnell Freshwater, NP  Specialists: none  Chief Complaint: syncope  HPI: Melissa Knox is a 66 y.o. female has a past medical history significant for ASCVD, HTn, and CKD now with 4-5 month hx of progressive weight loss with and abdominal pain. Recent non-contrast CT suggests pancreatic mass. EGD showed duodenal ulcer. Now with progressive weakness and syncope. In ER, hgb down to 7.4. Tachycardic. Stool guaiac positive. She is now admitted. Still with anorexia, weight loss, and abdominal pain. No fever. Denies CP or SOB. Some nausea but no vomiting or diarrhea.  Review of Systems: The patient denies fever, vision loss, decreased hearing, hoarseness, chest pain, dyspnea on exertion, peripheral edema, balance deficits, hemoptysis, melena, hematochezia, severe indigestion/heartburn, hematuria, incontinence, genital sores, muscle weakness, suspicious skin lesions, transient blindness, difficulty walking, depression, unusual weight change, abnormal bleeding, enlarged lymph nodes, angioedema, and breast masses.   Past Medical History:  Diagnosis Date  . Atherosclerotic heart disease 06/12/2015  . CKD (chronic kidney disease) stage 2, GFR 60-89 ml/min 06/12/2015  . Hypercalcemia 06/12/2015  . Hyperlipidemia 06/12/2015  . Hypertension 06/12/2015  . Hypothyroidism 06/12/2015  . Insomnia 06/12/2015  . 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 Shortness of breath 06/12/2015  . Solitary pulmonary nodule 06/12/2015   Past Surgical History:  Procedure Laterality Date  . GALLBLADDER SURGERY  2012   Social History:  reports that she has quit smoking. She has never used smokeless tobacco. She reports that she does not drink alcohol or use drugs.  No Known Allergies  Family History  Problem Relation Age of Onset  . Cancer Mother   . Hypertension Father   . Diabetes  Father   . Cancer Father     Prior to Admission medications   Medication Sig Start Date End Date Taking? Authorizing Provider  albuterol (PROVENTIL) (2.5 MG/3ML) 0.083% nebulizer solution Take 2.5 mg by nebulization every 6 (six) hours as needed for wheezing or shortness of breath.    [provider]  amLODipine (NORVASC) 10 MG tablet Take 10 mg by mouth daily. 04/21/16   [provider]  aspirin 81 MG tablet Take 81 mg by mouth daily.    [provider]  atorvastatin (LIPITOR) 10 MG tablet Take 10 mg by mouth daily.    [provider]  budesonide-formoterol (SYMBICORT) 160-4.5 MCG/ACT inhaler Inhale 2 puffs into the lungs 2 (two) times daily.    [provider]  cetirizine (ZYRTEC) 10 MG tablet Take 10 mg by mouth daily.    [provider]  diltiazem (CARDIZEM CD) 180 MG 24 hr capsule Take 180 mg by mouth daily.    [provider]  ergocalciferol (VITAMIN D2) 50000 UNITS capsule Take 50,000 Units by mouth once a week.    [provider]  hydrALAZINE (APRESOLINE) 50 MG tablet Take 50 mg by mouth 3 (three) times daily. 04/10/16   [provider]  HYDROcodone-acetaminophen (NORCO/VICODIN) 5-325 MG tablet TAKE 1 TABLET BY MOUTH 3 TO 4 TIMES DAILY AS NEEDED FOR PAIN 01/20/17   [provider]  levothyroxine (SYNTHROID, LEVOTHROID) 50 MCG tablet Take 50 mcg by mouth daily before breakfast.    [provider]  losartan (COZAAR) 100 MG tablet Take 100 mg by mouth daily. 02/18/16   [provider]  meclizine (ANTIVERT) 12.5 MG tablet  06/02/16   [provider]  metoCLOPramide (REGLAN) 5 MG tablet TAKE  1 TABLET BY MOUTH TWICE A DAY PRIOR TO BREAKFAST AND DINNER. 01/20/17   [provider]  mirtazapine (REMERON) 15 MG tablet Take 7.5 mg by mouth at bedtime.     [provider]  ranitidine (ZANTAC) 150 MG capsule Take 150 mg by mouth 2 (two) times daily.    [provider]  traMADol (ULTRAM) 50 MG tablet TAKE 1 TABLET BY MOUTH EVERY 4 TO 6 HOURS AS NEEDED FOR PAIN 12/31/16   [provider]   Physical Exam: Vitals:   02/06/17 1047 02/06/17 1048 02/06/17 1130  BP: (!) 149/47  (!) 111/51  Pulse: (!) 108  100  Resp: 17  18  Temp: 98.1 F (36.7 C)    TempSrc: Oral    SpO2: 100%  100%  Weight:  61.4 kg (135 lb 5.8 oz)      General:  No apparent distress, [place]/AT, thin  Eyes: PERRL, EOMI, no scleral icterus, conjunctiva clear  ENT: moist oropharynx without exudate, TM's benign, dentition fair  Neck: supple, no lymphadenopathy. No bruits or thyromegaly  Cardiovascular: rapid rate with regular without MRG; 2+ peripheral pulses, no JVD, no peripheral edema  Respiratory: CTA biL, good air movement without wheezing, rhonchi or crackled. Respiratory effort normal  Abdomen: soft, mildly tender to palpation, positive bowel sounds, no guarding, no rebound  Skin: no rashes or lesions  Musculoskeletal: normal bulk and tone, no joint swelling  Psychiatric: normal mood and affect, A&OX3  Neurologic: CN 2-12 grossly intact, Motor strength 5/5 in all 4 groups with symmetric DTR's and non-focal sensory exam  Labs on Admission:  Basic Metabolic Panel:  Recent Labs Lab 02/06/17 1058  NA 134*  K 3.2*  CL 102  CO2 22  GLUCOSE 119*  BUN 22*  CREATININE 2.36*  CALCIUM 10.5*   Liver Function Tests:  Recent Labs Lab 02/06/17 1058  AST 17  ALT 13*  ALKPHOS 59  BILITOT 0.3  PROT 7.7  ALBUMIN 3.2*    Recent Labs Lab 02/06/17 1058  LIPASE 29   No results for input(s): AMMONIA in the last 168 hours. CBC:  Recent Labs Lab 02/06/17 1058  WBC 8.6  HGB 7.4*  HCT 22.4*  MCV 83.9  PLT 531*   Cardiac Enzymes: No results for input(s): CKTOTAL, CKMB, CKMBINDEX, TROPONINI in the last 168 hours.  BNP (last 3 results) No results for input(s): BNP in the last 8760 hours.  ProBNP (last 3 results) No results for input(s): PROBNP in the  last 8760 hours.  CBG: No results for input(s): GLUCAP in the last 168 hours.  Radiological Exams on Admission: No results found.  EKG: Independently reviewed.  Assessment/Plan Principal Problem:   Acute blood loss anemia Active Problems:   Syncope   GI bleed   Pancreatic mass   Will admit to floor and begin IV fluids and IV protonix with po Carafate. Send off CA 19-9. Consult GI and Nephrology. Follow hgb and transfuse if <7. Guaiac stools. Repeat labs In AM. Clear liquid diet.  Diet: clear liquids Fluids: NS with K+@100  DVT Prophylaxis: TED hose  Code Status: FULL  Family Communication: yes  Disposition Plan: home  Time spent: 55 min

## 2017-02-07 ENCOUNTER — Inpatient Hospital Stay: Payer: Medicare Other

## 2017-02-07 DIAGNOSIS — D62 Acute posthemorrhagic anemia: Secondary | ICD-10-CM

## 2017-02-07 DIAGNOSIS — R109 Unspecified abdominal pain: Secondary | ICD-10-CM

## 2017-02-07 DIAGNOSIS — R1084 Generalized abdominal pain: Secondary | ICD-10-CM

## 2017-02-07 LAB — CBC
HEMATOCRIT: 29.3 % — AB (ref 35.0–47.0)
HEMOGLOBIN: 10 g/dL — AB (ref 12.0–16.0)
MCH: 28.1 pg (ref 26.0–34.0)
MCHC: 34 g/dL (ref 32.0–36.0)
MCV: 82.6 fL (ref 80.0–100.0)
Platelets: 414 10*3/uL (ref 150–440)
RBC: 3.55 MIL/uL — ABNORMAL LOW (ref 3.80–5.20)
RDW: 16.1 % — ABNORMAL HIGH (ref 11.5–14.5)
WBC: 6 10*3/uL (ref 3.6–11.0)

## 2017-02-07 LAB — COMPREHENSIVE METABOLIC PANEL
ALBUMIN: 2.9 g/dL — AB (ref 3.5–5.0)
ALT: 11 U/L — ABNORMAL LOW (ref 14–54)
ANION GAP: 6 (ref 5–15)
AST: 18 U/L (ref 15–41)
Alkaline Phosphatase: 50 U/L (ref 38–126)
BILIRUBIN TOTAL: 0.6 mg/dL (ref 0.3–1.2)
BUN: 14 mg/dL (ref 6–20)
CHLORIDE: 111 mmol/L (ref 101–111)
CO2: 20 mmol/L — ABNORMAL LOW (ref 22–32)
Calcium: 8.6 mg/dL — ABNORMAL LOW (ref 8.9–10.3)
Creatinine, Ser: 1.83 mg/dL — ABNORMAL HIGH (ref 0.44–1.00)
GFR calc Af Amer: 32 mL/min — ABNORMAL LOW (ref >60)
GFR, EST NON AFRICAN AMERICAN: 28 mL/min — AB (ref >60)
GLUCOSE: 79 mg/dL (ref 65–99)
POTASSIUM: 3.6 mmol/L (ref 3.5–5.1)
Sodium: 137 mmol/L (ref 135–145)
TOTAL PROTEIN: 6.9 g/dL (ref 6.5–8.1)

## 2017-02-07 LAB — LIPASE, BLOOD: Lipase: 30 U/L (ref 11–51)

## 2017-02-07 MED ORDER — IOPAMIDOL (ISOVUE-300) INJECTION 61%
15.0000 mL | INTRAVENOUS | Status: AC
  Administered 2017-02-07 (×2): 15 mL via ORAL

## 2017-02-07 MED ORDER — FAMOTIDINE 20 MG PO TABS
10.0000 mg | ORAL_TABLET | Freq: Two times a day (BID) | ORAL | Status: DC
  Administered 2017-02-07 – 2017-02-08 (×2): 10 mg via ORAL
  Filled 2017-02-07 (×2): qty 1

## 2017-02-07 MED ORDER — IPRATROPIUM-ALBUTEROL 0.5-2.5 (3) MG/3ML IN SOLN: 3.0000 mL | Freq: Four times a day (QID) | RESPIRATORY_TRACT | Status: DC | PRN

## 2017-02-07 MED ORDER — POLYETHYLENE GLYCOL 3350 17 G PO PACK
17.0000 g | PACK | Freq: Every day | ORAL | Status: DC | PRN
  Administered 2017-02-08: 08:00:00 17 g via ORAL
  Filled 2017-02-07: qty 1

## 2017-02-07 MED ORDER — POLYETHYLENE GLYCOL 3350 17 G PO PACK
17.0000 g | PACK | Freq: Once | ORAL | Status: AC
  Administered 2017-02-07: 13:00:00 17 g via ORAL
  Filled 2017-02-07: qty 1

## 2017-02-07 MED ORDER — ENSURE ENLIVE PO LIQD
237.0000 mL | Freq: Three times a day (TID) | ORAL | Status: DC
  Administered 2017-02-07 – 2017-02-08 (×3): 237 mL via ORAL

## 2017-02-07 NOTE — Progress Notes (Signed)
While rounding, [place] made initial visit to room 111. Pt was in good spirits but anxious about a CT scan later in the day. Pt has pain in her left side and obvious weakness. Pt requested prayer for clear results and strength, which was provided. CH is available for follow up as needed.    02/07/17 1300  Clinical Encounter Type  Visited With Patient  Visit Type Initial;Spiritual support  Consult/Referral To Chaplain  Spiritual Encounters  Spiritual Needs Prayer;Emotional

## 2017-02-07 NOTE — Consult Note (Signed)
Lucilla Lame, MD [facility]  [address] [place], [place] [address] Phone: [phone] Fax : [phone]  Consultation  Referring Provider:     Dr. Benjie Karvonen Primary Care Physician:  Ronnell Freshwater, NP Primary Gastroenterologist:  [name]         Reason for Consultation:     Anemia and abdominal pain  Date of Admission:  02/06/2017 Date of Consultation:  02/07/2017         HPI:   Melissa Knox is a 66 y.o. female who recently underwent an EGD although it is unclear if she also had an EUS at the same time at [facility] last week. The patient was found to have a duodenal ulcer that was biopsied and was benign.  The patient had been previously found to have a fullness in the pancreas and the patient was suggested to undergo an endoscopic ultrasound.  The patient was now admitted with abdominal pain that she states has gotten worse since she had the procedure at [facility].  She also was found to have focal thickening in the second portion of duodenum consistent with her recent duodenal ulcer diagnosis and development of a circumferential wall thickening in the descending colon and transverse colon compatible with colitis. The patient was admitted yesterday with a hemoglobin of 6.8 which was down from 7.4 on the same day.  The patient was transfused and repeat hemoglobin this morning was 10.0. The patient's lipase Yesterday was 29. She reports that her abdominal pain is better today.  Past Medical History:  Diagnosis Date  . Atherosclerotic heart disease 06/12/2015  . CKD (chronic kidney disease) stage 2, GFR 60-89 ml/min 06/12/2015  . Hypercalcemia 06/12/2015  . Hyperlipidemia 06/12/2015  . Hypertension 06/12/2015  . Hypothyroidism 06/12/2015  . Insomnia 06/12/2015  . 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 Shortness of breath 06/12/2015  . Solitary pulmonary nodule 06/12/2015    Past Surgical History:  Procedure Laterality Date  . GALLBLADDER SURGERY  2012    Prior to Admission  medications   Medication Sig Start Date End Date Taking? Authorizing Provider  albuterol (PROVENTIL) (2.5 MG/3ML) 0.083% nebulizer solution Take 2.5 mg by nebulization every 6 (six) hours as needed for wheezing or shortness of breath.   Yes [provider]  amLODipine (NORVASC) 10 MG tablet Take 10 mg by mouth daily. 04/21/16  Yes [provider]  aspirin 81 MG tablet Take 81 mg by mouth daily.   Yes [provider]  atorvastatin (LIPITOR) 10 MG tablet Take 10 mg by mouth daily at 6 PM.    Yes [provider]  budesonide-formoterol (SYMBICORT) 160-4.5 MCG/ACT inhaler Inhale 2 puffs into the lungs 2 (two) times daily.   Yes [provider]  diltiazem (CARDIZEM CD) 180 MG 24 hr capsule Take 180 mg by mouth daily.   Yes [provider]  hydrALAZINE (APRESOLINE) 50 MG tablet Take 50 mg by mouth 2 (two) times daily.  04/10/16  Yes [provider]  HYDROcodone-acetaminophen (NORCO/VICODIN) 5-325 MG tablet TAKE 1 TABLET BY MOUTH 3 TO 4 TIMES DAILY AS NEEDED FOR PAIN 01/20/17  Yes [provider]  levothyroxine (SYNTHROID, LEVOTHROID) 50 MCG tablet Take 50 mcg by mouth daily before breakfast.   Yes [provider]  losartan (COZAAR) 100 MG tablet Take 100 mg by mouth daily. 02/18/16  Yes [provider]  meclizine (ANTIVERT) 12.5 MG tablet Take 12.5 mg by mouth daily.  06/02/16  Yes [provider]  mirtazapine (REMERON)  15 MG tablet Take 7.5 mg by mouth at bedtime.    Yes [provider]    Family History  Problem Relation Age of Onset  . Cancer Mother   . Hypertension Father   . Diabetes Father   . Cancer Father      Social History  Substance Use Topics  . Smoking status: Former Research scientist (life sciences)  . Smokeless tobacco: Never Used  . Alcohol use No    Allergies as of 02/06/2017  . (No Known Allergies)    Review of Systems:    All systems reviewed and negative except where noted in HPI.   Physical  Exam:  Vital signs in last 24 hours: Temp:  [97.1 F (36.2 C)-98.6 F (37 C)] 98.4 F (36.9 C) (06/11 1404) Pulse Rate:  [77-83] 82 (06/11 1404) Resp:  [14-20] 18 (06/11 1404) BP: (110-123)/(33-50) 114/40 (06/11 1404) SpO2:  [96 %-100 %] 97 % (06/11 1404) Weight:  [136 lb 8 oz (61.9 kg)] 136 lb 8 oz (61.9 kg) (06/11 0300) Last BM Date: 02/03/17 General:   Pleasant, cooperative in NAD Head:  Normocephalic and atraumatic. Eyes:   No icterus.   Conjunctiva pink. PERRLA. Ears:  Normal auditory acuity. Neck:  Supple; no masses or thyroidomegaly Lungs: Respirations even and unlabored. Lungs clear to auscultation bilaterally.   No wheezes, crackles, or rhonchi.  Heart:  Regular rate and rhythm;  Without murmur, clicks, rubs or gallops Abdomen:  Soft, nondistended, nontender. Normal bowel sounds. No appreciable masses or hepatomegaly.  No rebound or guarding.  Rectal:  Not performed. Msk:  Symmetrical without gross deformities.    Extremities:  Without edema, cyanosis or clubbing. Neurologic:  Alert and oriented x3;  grossly normal neurologically. Skin:  Intact without significant lesions or rashes. Cervical Nodes:  No significant cervical adenopathy. Psych:  Alert and cooperative. Normal affect.  LAB RESULTS:  Recent Labs  02/06/17 1058 02/06/17 1751 02/07/17 0709  WBC 8.6  --  6.0  HGB 7.4* 6.8* 10.0*  HCT 22.4*  --  29.3*  PLT 531*  --  414   BMET  Recent Labs  02/06/17 1058 02/07/17 0709  NA 134* 137  K 3.2* 3.6  CL 102 111  CO2 22 20*  GLUCOSE 119* 79  BUN 22* 14  CREATININE 2.36* 1.83*  CALCIUM 10.5* 8.6*   LFT  Recent Labs  02/06/17 1217 02/07/17 0709  PROT  --  6.9  ALBUMIN  --  2.9*  AST  --  18  ALT  --  11*  ALKPHOS  --  50  BILITOT  --  0.6  BILIDIR <0.1*  --    PT/INR  Recent Labs  02/06/17 1217  LABPROT 15.5*  INR 1.22    STUDIES: Ct Abdomen Pelvis Wo Contrast  Result Date: 02/07/2017 CLINICAL DATA:  Patient with progressive  weight loss and abdominal pain. Recent diagnosis duodenum ulcer. Anorexia and weight loss. Abdominal pain. EXAM: CT ABDOMEN AND PELVIS WITHOUT CONTRAST TECHNIQUE: Multidetector CT imaging of the abdomen and pelvis was performed following the standard protocol without IV contrast. COMPARISON:  CT abdomen pelvis 01/05/2017; 12/03/2010. FINDINGS: Lower chest: Stable cardiac and mediastinal contours. Coronary arterial atherosclerosis. Bibasilar pulmonary fibrosis. Hepatobiliary: Liver is normal in size and contour. Patient status post cholecystectomy. Pancreas: Persistent fullness within the region the pancreatic head. No upstream pancreatic ductal dilatation. Spleen: Unremarkable Adrenals/Urinary Tract: Focal atrophy involving the superior and interpolar region the right kidney. No hydronephrosis. Urinary bladder is unremarkable. Stomach/Bowel: Circumferential wall thickening of the  descending colon with surrounding fat stranding (image 39; series 2). Wall thickening of the transverse colon. Focal wall thickening involving the duodenum (image 44; series 6). Oral contrast material throughout the small large bowel. Normal morphology of the stomach. No free fluid or free intraperitoneal air. Vascular/Lymphatic: Normal caliber abdominal aorta. Dense calcified atherosclerotic plaque. No retroperitoneal lymphadenopathy. Reproductive: Uterus and adnexal structures are unremarkable. Other: None. Musculoskeletal: Lumbar spine degenerative changes. No aggressive or acute appearing osseous lesions. IMPRESSION: Interval development of circumferential wall thickening of the descending colon and distal transverse colon most compatible with colitis. Persistent fullness within the region the pancreatic head which is nonspecific in etiology. Underlying pancreatic head mass is not entirely excluded. In the nonacute setting, recommend further evaluation of the pancreatic parenchyma with MRI. Noncontrast MRI can be performed if patient is  amenable to proceed IV contrast material. Focal thickening of the second portion of the duodenum likely corresponds with recently diagnosed duodenum ulcer. Extensive aortic atherosclerosis. Electronically Signed   By: Lovey Newcomer M.D.   On: 02/07/2017 11:44      Impression / Plan:   Melissa Knox is a 66 y.o. y/o female with admission for anemia with a known duodenal ulcer.  The patient was also found to have finding on the CT scan consistent with colitis.  The distribution of the colitis is consistent with ischemic colitis which would go along with her abdominal pain. The patient should have her blood count monitored and the patient should undergo a colonoscopy in the future.  There is no need to repeat the EGD that was done recently and showed a duodenal ulcer unless there is any cause for concern of this ulcer starting to bleed.  At the present time the patient denies having any black stools or bloody stools since yesterday.  I will follow the patient along with you.  Thank you for involving me in the care of this patient.      LOS: 1 day   Lucilla Lame, MD  02/07/2017, 5:41 PM   Note: This dictation was prepared with Dragon dictation along with smaller phrase technology. Any transcriptional errors that result from this process are unintentional.

## 2017-02-07 NOTE — Progress Notes (Signed)
  Oncology Nurse Navigator Documentation Pathology report faxed to [name]. Melissa Knox has rescheduled appointment for EUS 6/26 with [name]. Navigator Location: [facility] (02/07/17 1400)   )Navigator Encounter Type: Letter/Fax/Email (02/07/17 1400)                                 Coordination of Care: EUS (02/07/17 1400)                  Time Spent with Patient: 15 (02/07/17 1400)

## 2017-02-07 NOTE — Progress Notes (Signed)
Initial Nutrition Assessment  DOCUMENTATION CODES:   Non-severe (moderate) malnutrition in context of chronic illness  INTERVENTION:  Provide Ensure Enlive po TID between meals, each supplement provides 350 kcal and 20 grams of protein. Will monitor PO intake and adjust as needed.  During Nutrition Focused Physical Exam, noted what may be Bitot's spots in patient's eyes. In absence of other medical explanation, may be a sign of Vitamin A deficiency. Recommend checking serum or plasma retinol and supplementing if patient found to be deficiency.  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  GOAL:   Patient will meet greater than or equal to 90% of their needs  MONITOR:   PO intake, Supplement acceptance, Labs, Weight trends, I & O's  REASON FOR ASSESSMENT:   Malnutrition Screening Tool    ASSESSMENT:   66 year old female with PMHx of CKD stage 2, hypothyroidism, HLD, HTN, hypercalcemia presented with abdominal pain and progressive weight loss, also found to have symptomatic anemia. Patient had recent CT which found pancreatic mass and a recent EGD finding duodenal ulcer. Patient is s/p EUS on 6/5.   Spoke with patient at bedside. She reports she has had a poor appetite for 3-4 months now. She is experiencing early satiety after only 2-3 bites of meals. Also endorses abdominal pain and difficulty swallowing some solids. Patient reports she does better with swallowing if she takes small bites and sips liquids between bites. Able to swallow liquids fine. Reports her gums are a little sore, so having some discomfort with chewing. Reports she has been constipated lately. Patient reports that when she had a good appetite she would only have one good meal per day, which was dinner (may have pork chop with  gravy, mashed potatoes, green beans). Otherwise would have small snacks during the day such as peanut butter crackers. 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 She is not sure if she will like Ensure but is amenable to trying. Discussed it is lactose-free.  Patient reports her UBW is 148 lbs. She reports she has lost approximately 12 lbs (8.1% body weight) over the past 3-4 months since her appetite has been poor, which would be significant if it was lost over 3 months, but would not be significant if lost over 4 months. Per weights in chart patient has been 135-140 lbs for the past 10 months, so unsure of how much weight patient has truly lost.   Medications reviewed and include: Colace, famotidine, levothyroxine, Reglan TID before meals, Remeron, pantoprazole, Vitamin D 50000 units weekly, NS with KCl 20 mEq/L @ 100 ml/hr.  Labs reviewed: CO2 20, Calcium 8.6 today (trending down from 10.5 on 6/10). Lipase WNL at 30.  Nutrition-Focused physical exam completed. Findings are mild-moderate fat depletion, mild-moderate muscle depletion, and no edema. Noted what may be Bitot's spots in patient's eyes on exam.  Discussed with RN.  Diet Order:  DIET SOFT Room service appropriate? Yes; Fluid consistency: Thin  Skin:  Reviewed, no issues  Last BM:  PTA (02/03/2017 per chart and pt report on exam)  Height:   Ht Readings from Last 1 Encounters:  02/06/17 5\' 1"  (1.549 m)    Weight:   Wt Readings from Last 1 Encounters:  02/07/17 136 lb 8 oz (61.9 kg)    Ideal Body Weight:  47.7 kg  BMI:  Body mass index  is 25.79 kg/m.  Estimated Nutritional Needs:   Kcal:  1440-1660 (MSJ x 1.3-1.5)  Protein:  75-90 grams (1.2-1.5 grams/kg)  Fluid:  1.8 L/day (30 ml/kg)  EDUCATION NEEDS:   Education needs addressed  Willey Blade, MS, RD, LDN Pager: [phone] After Hours Pager: [phone]

## 2017-02-07 NOTE — Progress Notes (Signed)
[facility] [place] NAME: Melissa Knox    MR#:  [id number]  DATE OF BIRTH:  25-Dec-1950  SUBJECTIVE:   Patient complaining of midepigastric abdominal pain radiating to the back. His increased since Thursday after her procedure No bowel movement since Thursday Has noticed dark-colored stools on Thursday  REVIEW OF SYSTEMS:    Review of Systems  Constitutional: Negative for fever, chills weight loss HENT: Negative for ear pain, nosebleeds, congestion, facial swelling, rhinorrhea, neck pain, neck stiffness and ear discharge.   Respiratory: Negative for cough, shortness of breath, wheezing  Cardiovascular: Negative for chest pain, palpitations and leg swelling.  Gastrointestinal: Negative for heartburn, , vomiting, diarrhea Positive for epigastric abdominal pain and consitpation Genitourinary: Negative for dysuria, urgency, frequency, hematuria Musculoskeletal: Negative for back pain or joint pain Neurological: Negative for dizziness, seizures, syncope, focal weakness,  numbness and headaches.  Hematological: Does not bruise/bleed easily.  Psychiatric/Behavioral: Negative for hallucinations, confusion, dysphoric mood    Tolerating Diet:npo      DRUG ALLERGIES:  No Known Allergies  VITALS:  Blood pressure (!) 111/39, pulse 77, temperature 98.4 F (36.9 C), temperature source Oral, resp. rate 14, height 5\' 1"  (1.549 m), weight 61.9 kg (136 lb 8 oz), SpO2 96 %.  PHYSICAL EXAMINATION:  Constitutional: Appears well-developed and well-nourished. No distress. HENT: Normocephalic. Marland Kitchen Oropharynx is clear and moist.  Eyes: Conjunctivae and EOM are normal. PERRLA, no scleral icterus.  Neck: Normal ROM. Neck supple. No JVD. No tracheal deviation. CVS: RRR, S1/S2 +, no murmurs, no gallops, no carotid bruit.  Pulmonary: Effort and breath sounds normal, no stridor, rhonchi, wheezes, rales.  Abdominal: Soft. BS +,  no distension, Positive  midepigastric tenderness, no rebound or guarding.  Musculoskeletal: Normal range of motion. No edema and no tenderness.  Neuro: Alert. CN 2-12 grossly intact. No focal deficits. Skin: Skin is warm and dry. No rash noted. Psychiatric: Normal mood and affect.      LABORATORY PANEL:   CBC  Recent Labs Lab 02/06/17 1058 02/06/17 1751  WBC 8.6  --   HGB 7.4* 6.8*  HCT 22.4*  --   PLT 531*  --    ------------------------------------------------------------------------------------------------------------------  Chemistries   Recent Labs Lab 02/07/17 0709  NA 137  K 3.6  CL 111  CO2 20*  GLUCOSE 79  BUN 14  CREATININE 1.83*  CALCIUM 8.6*  AST 18  ALT 11*  ALKPHOS 50  BILITOT 0.6   ------------------------------------------------------------------------------------------------------------------  Cardiac Enzymes No results for input(s): TROPONINI in the last 168 hours. ------------------------------------------------------------------------------------------------------------------  RADIOLOGY:  No results found.   ASSESSMENT AND PLAN:    66 year old female with a history of chronic kidney disease stage II and recent diagnosis of pancreatic mass status post EUS and duodenal ulcer on EGD who presents with symptomatic anemia.   1. Symptomatic anemia: Patient has been transfused 2 units PRBC A.m. CBC pending Case discussed with GI consultant  2. Midepigastric abdominal pain concerning for pancreatitis or pancreatic mass pain CT of the abdomen without contrast ordered NPO  3. Acute kidney injury on chronic kidney disease stage II: This is resolving with IV fluids  4. Pancreatic mass status post EUS: Patient follows at [facility]  5. Essential hypertension: Continue Cozaar, hydralazine, diltiazem  6. Hyperlipidemia: Continue statin  7. Hypothyroid: Continue Synthroid  Management plans discussed with the patient and she is in agreement. Case d/w dr Allen Norris  CODE  STATUS: full  TOTAL TIME TAKING CARE OF THIS PATIENT: 59  minutes.     POSSIBLE D/C 1-2 days, DEPENDING ON CLINICAL CONDITION.   [name] M.D on 02/07/2017 at 8:37 AM  Between 7am to 6pm - Pager - [phone] After 6pm go to [email or web address] - password [email or web address] [facility]  Office  [phone]  CC: Primary care physician; Ronnell Freshwater, NP  Note: This dictation was prepared with Dragon dictation along with smaller phrase technology. Any transcriptional errors that result from this process are unintentional.

## 2017-02-07 NOTE — Progress Notes (Signed)
[facility]  ROUNDING NOTE   Subjective:   [name] admitted to [facility] on 02/06/2017 for Symptomatic anemia [D64.9]  Patient had right and epigastric pain. He also had left sided weakness.   Started on NS infusion.  Creatinine 1.83 (2.36)  Objective:  Vital signs in last 24 hours:  Temp:  [97.1 F (36.2 C)-98.6 F (37 C)] 98.4 F (36.9 C) (06/11 0610) Pulse Rate:  [77-104] 82 (06/11 0844) Resp:  [14-20] 20 (06/11 0844) BP: (110-130)/(33-58) 123/50 (06/11 0844) SpO2:  [96 %-100 %] 97 % (06/11 0844) Weight:  [58.9 kg (129 lb 12.8 oz)-61.9 kg (136 lb 8 oz)] 61.9 kg (136 lb 8 oz) (06/11 0300)  Weight change:  Filed Weights   02/06/17 1048 02/06/17 1551 02/07/17 0300  Weight: 61.4 kg (135 lb 5.8 oz) 58.9 kg (129 lb 12.8 oz) 61.9 kg (136 lb 8 oz)    Intake/Output: I/O last 3 completed shifts: In: 3308 [P.O.:420; I.V.:1270; Blood:618; IV Piggyback:1000] Out: -    Intake/Output this shift:  No intake/output data recorded.  Physical Exam: General: NAD,   Head: Normocephalic, atraumatic. Moist oral mucosal membranes  Eyes: Anicteric, PERRL  Neck: Supple, trachea midline  Lungs:  Clear to auscultation  Heart: Regular rate and rhythm  Abdomen:  Soft, epigastric tenderness.   Extremities: no peripheral edema.  Neurologic: Nonfocal, moving all four extremities  Skin: No lesions       Basic Metabolic Panel:  Recent Labs Lab 02/06/17 1058 02/07/17 0709  NA 134* 137  K 3.2* 3.6  CL 102 111  CO2 22 20*  GLUCOSE 119* 79  BUN 22* 14  CREATININE 2.36* 1.83*  CALCIUM 10.5* 8.6*    Liver Function Tests:  Recent Labs Lab 02/06/17 1058 02/07/17 0709  AST 17 18  ALT 13* 11*  ALKPHOS 59 50  BILITOT 0.3 0.6  PROT 7.7 6.9  ALBUMIN 3.2* 2.9*    Recent Labs Lab 02/06/17 1058 02/07/17 0709  LIPASE 29 30   No results for input(s): AMMONIA in the last 168 hours.  CBC:  Recent Labs Lab 02/06/17 1058 02/06/17 1751 02/07/17 0709  WBC 8.6   --  6.0  HGB 7.4* 6.8* 10.0*  HCT 22.4*  --  29.3*  MCV 83.9  --  82.6  PLT 531*  --  414    Cardiac Enzymes: No results for input(s): CKTOTAL, CKMB, CKMBINDEX, TROPONINI in the last 168 hours.  BNP: Invalid input(s): POCBNP  CBG: No results for input(s): GLUCAP in the last 168 hours.  Microbiology: Results for orders placed or performed in visit on 08/22/12  Culture, blood (single)     Status: None   Collection Time: 08/22/12  4:50 PM  Result Value Ref Range Status   Micro Text Report   Final       ORGANISM 1                STREPTOCOCCUS PNEUMONIAE   COMMENT                   IN AEROBIC AND ANAEROBIC BOTTLES   COMMENT                   -   GRAM STAIN                GRAM POSITIVE COCCI   ANTIBIOTIC                    ORG#1  CEFTRIAXONE                   S/0.032   ERYTHROMYCIN                  S/0.064   LEVOFLOXACIN                  S/1.0     OXACILLIN                     S         VANCOMYCIN                    S/0.50    PENICILLIN MIC                S/0.047     Culture, blood (single)     Status: None   Collection Time: 08/22/12  4:57 PM  Result Value Ref Range Status   Micro Text Report   Final       ORGANISM 1                STREPTOCOCCUS PNEUMONIAE   COMMENT                   IN AEROBIC AND ANAEROBIC BOTTLES   COMMENT                   -   GRAM STAIN                GRAM POSITIVE COCCI   ANTIBIOTIC                    ORG#1     CEFTRIAXONE                   S/0.023   ERYTHROMYCIN                  S/0.064   LEVOFLOXACIN                  S/1.0     OXACILLIN                     S         VANCOMYCIN                    S/0.50    PENICILLIN MIC                S/0.047     Culture, blood (single)     Status: None   Collection Time: 08/23/12  5:46 PM  Result Value Ref Range Status   Micro Text Report   Final       COMMENT                   NO GROWTH AEROBICALLY/ANAEROBICALLY IN 5 DAYS   ANTIBIOTIC                                                       Culture, blood (single)     Status: None   Collection Time: 08/23/12  5:55 PM  Result Value Ref Range Status   Micro Text Report   Final       COMMENT  NO GROWTH AEROBICALLY/ANAEROBICALLY IN 5 DAYS   ANTIBIOTIC                                                        Coagulation Studies:  Recent Labs  02/06/17 1217  LABPROT 15.5*  INR 1.22    Urinalysis:  Recent Labs  02/06/17 1407  COLORURINE YELLOW*  LABSPEC 1.009  PHURINE 5.0  GLUCOSEU NEGATIVE  HGBUR NEGATIVE  BILIRUBINUR NEGATIVE  KETONESUR NEGATIVE  PROTEINUR NEGATIVE  NITRITE NEGATIVE  LEUKOCYTESUR SMALL*      Imaging: Ct Abdomen Pelvis Wo Contrast  Result Date: 02/07/2017 CLINICAL DATA:  Patient with progressive weight loss and abdominal pain. Recent diagnosis duodenum ulcer. Anorexia and weight loss. Abdominal pain. EXAM: CT ABDOMEN AND PELVIS WITHOUT CONTRAST TECHNIQUE: Multidetector CT imaging of the abdomen and pelvis was performed following the standard protocol without IV contrast. COMPARISON:  CT abdomen pelvis 01/05/2017; 12/03/2010. FINDINGS: Lower chest: Stable cardiac and mediastinal contours. Coronary arterial atherosclerosis. Bibasilar pulmonary fibrosis. Hepatobiliary: Liver is normal in size and contour. Patient status post cholecystectomy. Pancreas: Persistent fullness within the region the pancreatic head. No upstream pancreatic ductal dilatation. Spleen: Unremarkable Adrenals/Urinary Tract: Focal atrophy involving the superior and interpolar region the right kidney. No hydronephrosis. Urinary bladder is unremarkable. Stomach/Bowel: Circumferential wall thickening of the descending colon with surrounding fat stranding (image 39; series 2). Wall thickening of the transverse colon. Focal wall thickening involving the duodenum (image 44; series 6). Oral contrast material throughout the small large bowel. Normal morphology of the stomach. No free fluid or free intraperitoneal air.  Vascular/Lymphatic: Normal caliber abdominal aorta. Dense calcified atherosclerotic plaque. No retroperitoneal lymphadenopathy. Reproductive: Uterus and adnexal structures are unremarkable. Other: None. Musculoskeletal: Lumbar spine degenerative changes. No aggressive or acute appearing osseous lesions. IMPRESSION: Interval development of circumferential wall thickening of the descending colon and distal transverse colon most compatible with colitis. Persistent fullness within the region the pancreatic head which is nonspecific in etiology. Underlying pancreatic head mass is not entirely excluded. In the nonacute setting, recommend further evaluation of the pancreatic parenchyma with MRI. Noncontrast MRI can be performed if patient is amenable to proceed IV contrast material. Focal thickening of the second portion of the duodenum likely corresponds with recently diagnosed duodenum ulcer. Extensive aortic atherosclerosis. Electronically Signed   By: Lovey Newcomer M.D.   On: 02/07/2017 11:44     Medications:   . 0.9 % NaCl with KCl 20 mEq / L 100 mL/hr at 02/07/17 0813   . atorvastatin  10 mg Oral Daily  . diltiazem  180 mg Oral Daily  . docusate sodium  100 mg Oral BID  . famotidine  10 mg Oral BID  . hydrALAZINE  50 mg Oral BID  . levothyroxine  50 mcg Oral QAC breakfast  . loratadine  10 mg Oral Daily  . losartan  50 mg Oral Daily  . metoCLOPramide  5 mg Oral TID AC  . mirtazapine  7.5 mg Oral QHS  . mometasone-formoterol  2 puff Inhalation BID  . pantoprazole (PROTONIX) IV  40 mg Intravenous Q12H  . sucralfate  1 g Oral TID WC & HS  . Vitamin D (Ergocalciferol)  50,000 Units Oral Weekly   acetaminophen **OR** acetaminophen, bisacodyl, HYDROcodone-acetaminophen, ipratropium-albuterol, ondansetron **OR** ondansetron (ZOFRAN) IV, polyethylene glycol  Assessment/ Plan:  [name]  is a 66 y.o. black female with hypertension, COPD, pancreatic mass, anemia, hypothyroidism,  hyperlipidemia  1. Acute renal failure on chronic kidney disease stage III with proteinuria. Baseline creatinine 1.89, GFR 32.  Acute renal failure secondary to prerenal azotemia. Improved with IV fluids Chronic kidney disease secondary to hypertension  - losartan.   2. Hypertension: blood pressure at goal 123/50.  - diltiazem, hydrlazine, losartan  3. Hypercalcemia: outpatient PTH 14. Calcium improved to 8.6 with IV fluids Concerning for underlying malignacy.   4. Anemia chronic kidney disease.  Status post PRBC 1 unit transfusion 6/11. Hemoglobin improved from 6.8 to 10.    LOS: [place], Haiden Clucas 6/11/201812:47 PM

## 2017-02-08 LAB — CBC
HEMATOCRIT: 28.3 % — AB (ref 35.0–47.0)
HEMOGLOBIN: 9.3 g/dL — AB (ref 12.0–16.0)
MCH: 26.9 pg (ref 26.0–34.0)
MCHC: 32.9 g/dL (ref 32.0–36.0)
MCV: 81.9 fL (ref 80.0–100.0)
Platelets: 416 10*3/uL (ref 150–440)
RBC: 3.46 MIL/uL — ABNORMAL LOW (ref 3.80–5.20)
RDW: 15.6 % — ABNORMAL HIGH (ref 11.5–14.5)
WBC: 6.5 10*3/uL (ref 3.6–11.0)

## 2017-02-08 LAB — TYPE AND SCREEN
ABO/RH(D): A POS
Antibody Screen: NEGATIVE
UNIT DIVISION: 0
Unit division: 0

## 2017-02-08 LAB — BASIC METABOLIC PANEL
Anion gap: 3 — ABNORMAL LOW (ref 5–15)
BUN: 9 mg/dL (ref 6–20)
CHLORIDE: 112 mmol/L — AB (ref 101–111)
CO2: 23 mmol/L (ref 22–32)
Calcium: 8.1 mg/dL — ABNORMAL LOW (ref 8.9–10.3)
Creatinine, Ser: 1.39 mg/dL — ABNORMAL HIGH (ref 0.44–1.00)
GFR calc non Af Amer: 39 mL/min — ABNORMAL LOW (ref >60)
GFR, EST AFRICAN AMERICAN: 45 mL/min — AB (ref >60)
Glucose, Bld: 84 mg/dL (ref 65–99)
POTASSIUM: 4.1 mmol/L (ref 3.5–5.1)
Sodium: 138 mmol/L (ref 135–145)

## 2017-02-08 LAB — BPAM RBC
BLOOD PRODUCT EXPIRATION DATE: 201806132359
Blood Product Expiration Date: 201806152359
ISSUE DATE / TIME: 201806110256
ISSUE DATE / TIME: 201806102352
UNIT TYPE AND RH: 9500
UNIT TYPE AND RH: 9500

## 2017-02-08 LAB — CA 19-9 (SERIAL): CA 19-9: 1 U/mL (ref 0–35)

## 2017-02-08 MED ORDER — PANTOPRAZOLE SODIUM 40 MG PO TBEC: 40.0000 mg | DELAYED_RELEASE_TABLET | Freq: Two times a day (BID) | ORAL | 0 refills | Status: DC

## 2017-02-08 MED ORDER — ENSURE ENLIVE PO LIQD: 237.0000 mL | Freq: Three times a day (TID) | ORAL | 12 refills | Status: DC

## 2017-02-08 MED ORDER — SUCRALFATE 1 G PO TABS: 1.0000 g | ORAL_TABLET | Freq: Three times a day (TID) | ORAL | 0 refills | Status: DC

## 2017-02-08 NOTE — Discharge Summary (Signed)
[place] [facility] [place] NAME: Melissa Knox    MR#:  [id number]  DATE OF BIRTH:  10-09-1950  DATE OF ADMISSION:  02/06/2017 ADMITTING PHYSICIAN: Idelle Crouch, MD  DATE OF DISCHARGE: 02/08/2017  PRIMARY CARE PHYSICIAN: Ronnell Freshwater, NP    ADMISSION DIAGNOSIS:  Symptomatic anemia [D64.9]  DISCHARGE DIAGNOSIS:  Principal Problem:   Acute blood loss anemia Active Problems:   Syncope   GI bleed   Pancreatic mass   Abdominal pain   SECONDARY DIAGNOSIS:   Past Medical History:  Diagnosis Date  . Atherosclerotic heart disease 06/12/2015  . CKD (chronic kidney disease) stage 2, GFR 60-89 ml/min 06/12/2015  . Hypercalcemia 06/12/2015  . Hyperlipidemia 06/12/2015  . Hypertension 06/12/2015  . Hypothyroidism 06/12/2015  . Insomnia 06/12/2015  . 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 Shortness of breath 06/12/2015  . Solitary pulmonary nodule 06/12/2015    HOSPITAL COURSE:   66 year old female with a history of chronic kidney disease stage II and recent diagnosis of pancreatic mass status post EUS and duodenal ulcer on EGD who presents with symptomatic anemia.   1. Symptomatic anemia: Patient was been transfused 2 units PRBC Her Hgb has been stable. She has been evaluated by GI. She has a history of large duodenal ulcer. GI did not recommend repeating EGD as patient was not having upper GI symptoms. She will continue on PPI  2. Midepigastric abdominal pain: Pancreatic mass is contributing to her abdominal pain as well as colitis seen on CT scan. It is thought the patient had ischemic colitis. Her white blood cell count and hemoglobin have remained stable. She is tolerating her diet without pain.   3. Acute kidney injury on chronic kidney disease stage II: This has resolved with IV fluids.   4. Pancreatic mass status post EUS: Patient follows at [facility]  5. Essential hypertension: Continue Cozaar, hydralazine,  diltiazem  6. Hyperlipidemia: Continue statin  7. Hypothyroid: Continue Synthroid   DISCHARGE CONDITIONS AND DIET:   Stable for discharge on heart healthy diet  CONSULTS OBTAINED:  Treatment Team:  Anthonette Legato, MD Jonathon Bellows, MD  DRUG ALLERGIES:  No Known Allergies  DISCHARGE MEDICATIONS:   Current Discharge Medication List    START taking these medications   Details  feeding supplement, ENSURE ENLIVE, (ENSURE ENLIVE) LIQD Take 237 mLs by mouth 3 (three) times daily between meals. Qty: 237 mL, Refills: 12    pantoprazole (PROTONIX) 40 MG tablet Take 1 tablet (40 mg total) by mouth 2 (two) times daily. Qty: 30 tablet, Refills: 0    sucralfate (CARAFATE) 1 g tablet Take 1 tablet (1 g total) by mouth 4 (four) times daily -  with meals and at bedtime. Qty: 90 tablet, Refills: 0      CONTINUE these medications which have NOT CHANGED   Details  albuterol (PROVENTIL) (2.5 MG/3ML) 0.083% nebulizer solution Take 2.5 mg by nebulization every 6 (six) hours as needed for wheezing or shortness of breath.    amLODipine (NORVASC) 10 MG tablet Take 10 mg by mouth daily. Refills: 12   Associated Diagnoses: Anemia in chronic renal disease    aspirin 81 MG tablet Take 81 mg by mouth daily.    atorvastatin (LIPITOR) 10 MG tablet Take 10 mg by mouth daily at 6 PM.     budesonide-formoterol (SYMBICORT) 160-4.5 MCG/ACT inhaler Inhale 2 puffs into the lungs 2 (two) times daily.    diltiazem (CARDIZEM CD) 180 MG 24 hr capsule  Take 180 mg by mouth daily.    hydrALAZINE (APRESOLINE) 50 MG tablet Take 50 mg by mouth 2 (two) times daily.  Refills: 6   Associated Diagnoses: Anemia in chronic renal disease    HYDROcodone-acetaminophen (NORCO/VICODIN) 5-325 MG tablet TAKE 1 TABLET BY MOUTH 3 TO 4 TIMES DAILY AS NEEDED FOR PAIN Refills: 0    levothyroxine (SYNTHROID, LEVOTHROID) 50 MCG tablet Take 50 mcg by mouth daily before breakfast.    losartan (COZAAR) 100 MG tablet Take 100 mg  by mouth daily. Refills: 3   Associated Diagnoses: Anemia in chronic renal disease    meclizine (ANTIVERT) 12.5 MG tablet Take 12.5 mg by mouth daily.     mirtazapine (REMERON) 15 MG tablet Take 7.5 mg by mouth at bedtime.       STOP taking these medications     cetirizine (ZYRTEC) 10 MG tablet      ergocalciferol (VITAMIN D2) 50000 UNITS capsule      metoCLOPramide (REGLAN) 5 MG tablet      ranitidine (ZANTAC) 150 MG capsule           Today   CHIEF COMPLAINT:  No acute events overnight. Patient still feels a little bit weak. Denies shortness of breath, dyspnea exertion or chest pain   VITAL SIGNS:  Blood pressure (!) 129/48, pulse 86, temperature 98.3 F (36.8 C), temperature source Oral, resp. rate 20, height 5\' 1"  (1.549 m), weight 63.1 kg (139 lb 1.6 oz), SpO2 96 %.   REVIEW OF SYSTEMS:  Review of Systems  Constitutional: Negative for chills, fever and malaise/fatigue.  HENT: Negative.  Negative for ear discharge, ear pain, hearing loss, nosebleeds and sore throat.   Eyes: Negative.  Negative for blurred vision and pain.  Respiratory: Negative.  Negative for cough, hemoptysis, shortness of breath and wheezing.   Cardiovascular: Negative.  Negative for chest pain, palpitations and leg swelling.  Gastrointestinal: Positive for abdominal pain (From pancreatic mass). Negative for blood in stool, diarrhea, nausea and vomiting.  Genitourinary: Negative.  Negative for dysuria.  Musculoskeletal: Negative.  Negative for back pain.  Skin: Negative.   Neurological: Positive for weakness. Negative for dizziness, tremors, speech change, focal weakness, seizures and headaches.  Endo/Heme/Allergies: Negative.  Does not bruise/bleed easily.  Psychiatric/Behavioral: Negative.  Negative for depression, hallucinations and suicidal ideas.     PHYSICAL EXAMINATION:  GENERAL:  66 y.o.-year-old patient lying in the bed with no acute distress.  NECK:  Supple, no jugular venous  distention. No thyroid enlargement, no tenderness.  LUNGS: Normal breath sounds bilaterally, no wheezing, rales,rhonchi  No use of accessory muscles of respiration.  CARDIOVASCULAR: S1, S2 normal. No murmurs, rubs, or gallops.  ABDOMEN: Soft, non-tender, non-distended. Bowel sounds present. No organomegaly or mass.  EXTREMITIES: No pedal edema, cyanosis, or clubbing.  PSYCHIATRIC: The patient is alert and oriented x 3.  SKIN: No obvious rash, lesion, or ulcer.   DATA REVIEW:   CBC  Recent Labs Lab 02/08/17 0542  WBC 6.5  HGB 9.3*  HCT 28.3*  PLT 416    Chemistries   Recent Labs Lab 02/07/17 0709 02/08/17 0542  NA 137 138  K 3.6 4.1  CL 111 112*  CO2 20* 23  GLUCOSE 79 84  BUN 14 9  CREATININE 1.83* 1.39*  CALCIUM 8.6* 8.1*  AST 18  --   ALT 11*  --   ALKPHOS 50  --   BILITOT 0.6  --     Cardiac Enzymes No results for input(s): TROPONINI  in the last 168 hours.  Microbiology Results  @[id number] @  RADIOLOGY:  Ct Abdomen Pelvis Wo Contrast  Result Date: 02/07/2017 CLINICAL DATA:  Patient with progressive weight loss and abdominal pain. Recent diagnosis duodenum ulcer. Anorexia and weight loss. Abdominal pain. EXAM: CT ABDOMEN AND PELVIS WITHOUT CONTRAST TECHNIQUE: Multidetector CT imaging of the abdomen and pelvis was performed following the standard protocol without IV contrast. COMPARISON:  CT abdomen pelvis 01/05/2017; 12/03/2010. FINDINGS: Lower chest: Stable cardiac and mediastinal contours. Coronary arterial atherosclerosis. Bibasilar pulmonary fibrosis. Hepatobiliary: Liver is normal in size and contour. Patient status post cholecystectomy. Pancreas: Persistent fullness within the region the pancreatic head. No upstream pancreatic ductal dilatation. Spleen: Unremarkable Adrenals/Urinary Tract: Focal atrophy involving the superior and interpolar region the right kidney. No hydronephrosis. Urinary bladder is unremarkable. Stomach/Bowel: Circumferential wall  thickening of the descending colon with surrounding fat stranding (image 39; series 2). Wall thickening of the transverse colon. Focal wall thickening involving the duodenum (image 44; series 6). Oral contrast material throughout the small large bowel. Normal morphology of the stomach. No free fluid or free intraperitoneal air. Vascular/Lymphatic: Normal caliber abdominal aorta. Dense calcified atherosclerotic plaque. No retroperitoneal lymphadenopathy. Reproductive: Uterus and adnexal structures are unremarkable. Other: None. Musculoskeletal: Lumbar spine degenerative changes. No aggressive or acute appearing osseous lesions. IMPRESSION: Interval development of circumferential wall thickening of the descending colon and distal transverse colon most compatible with colitis. Persistent fullness within the region the pancreatic head which is nonspecific in etiology. Underlying pancreatic head mass is not entirely excluded. In the nonacute setting, recommend further evaluation of the pancreatic parenchyma with MRI. Noncontrast MRI can be performed if patient is amenable to proceed IV contrast material. Focal thickening of the second portion of the duodenum likely corresponds with recently diagnosed duodenum ulcer. Extensive aortic atherosclerosis. Electronically Signed   By: Lovey Newcomer M.D.   On: 02/07/2017 11:44      Current Discharge Medication List    START taking these medications   Details  feeding supplement, ENSURE ENLIVE, (ENSURE ENLIVE) LIQD Take 237 mLs by mouth 3 (three) times daily between meals. Qty: 237 mL, Refills: 12    pantoprazole (PROTONIX) 40 MG tablet Take 1 tablet (40 mg total) by mouth 2 (two) times daily. Qty: 30 tablet, Refills: 0    sucralfate (CARAFATE) 1 g tablet Take 1 tablet (1 g total) by mouth 4 (four) times daily -  with meals and at bedtime. Qty: 90 tablet, Refills: 0      CONTINUE these medications which have NOT CHANGED   Details  albuterol (PROVENTIL) (2.5  MG/3ML) 0.083% nebulizer solution Take 2.5 mg by nebulization every 6 (six) hours as needed for wheezing or shortness of breath.    amLODipine (NORVASC) 10 MG tablet Take 10 mg by mouth daily. Refills: 12   Associated Diagnoses: Anemia in chronic renal disease    aspirin 81 MG tablet Take 81 mg by mouth daily.    atorvastatin (LIPITOR) 10 MG tablet Take 10 mg by mouth daily at 6 PM.     budesonide-formoterol (SYMBICORT) 160-4.5 MCG/ACT inhaler Inhale 2 puffs into the lungs 2 (two) times daily.    diltiazem (CARDIZEM CD) 180 MG 24 hr capsule Take 180 mg by mouth daily.    hydrALAZINE (APRESOLINE) 50 MG tablet Take 50 mg by mouth 2 (two) times daily.  Refills: 6   Associated Diagnoses: Anemia in chronic renal disease    HYDROcodone-acetaminophen (NORCO/VICODIN) 5-325 MG tablet TAKE 1 TABLET BY MOUTH 3 TO 4 TIMES  DAILY AS NEEDED FOR PAIN Refills: 0    levothyroxine (SYNTHROID, LEVOTHROID) 50 MCG tablet Take 50 mcg by mouth daily before breakfast.    losartan (COZAAR) 100 MG tablet Take 100 mg by mouth daily. Refills: 3   Associated Diagnoses: Anemia in chronic renal disease    meclizine (ANTIVERT) 12.5 MG tablet Take 12.5 mg by mouth daily.     mirtazapine (REMERON) 15 MG tablet Take 7.5 mg by mouth at bedtime.       STOP taking these medications     cetirizine (ZYRTEC) 10 MG tablet      ergocalciferol (VITAMIN D2) 50000 UNITS capsule      metoCLOPramide (REGLAN) 5 MG tablet      ranitidine (ZANTAC) 150 MG capsule           Management plans discussed with the patient and she is in agreement. Stable for discharge   Patient should follow up with pcp  CODE STATUS:     Code Status Orders        Start     Ordered   02/06/17 1525  Full code  Continuous     02/06/17 1525    Code Status History    Date Active Date Inactive Code Status Order ID Comments User Context   This patient has a current code status but no historical code status.      TOTAL TIME TAKING  CARE OF THIS PATIENT: 37 minutes.    Note: This dictation was prepared with Dragon dictation along with smaller phrase technology. Any transcriptional errors that result from this process are unintentional.  Jatziry Wechter M.D on 02/08/2017 at 8:38 AM  Between 7am to 6pm - Pager - [phone] After 6pm go to [email or web address] - password [email or web address]  Office  [phone]  CC: Primary care physician; Ronnell Freshwater, NP

## 2017-02-08 NOTE — Progress Notes (Signed)
Pt being discharged home, discharge instructions and prescriptions reviewed with pt, states understanding, pt with no complaints 

## 2017-02-08 NOTE — Evaluation (Signed)
Physical Therapy Evaluation Patient Details Name: Melissa Knox MRN: [id number] DOB: October 30, 1950 Today's Date: 02/08/2017   History of Present Illness  presented to ER secondary to progressive weakness, syncopal episode; admitted with symptomatic acute blood loss anemia secondary to GIB.  Current HgB (status post transfusion) 9.3  Clinical Impression  Upon evaluation, patient alert and oriented; follows all commands and demonstrates good safety awareness/insight.  bilat UE/LE strength and ROM grossly symmetrical and WFL; no focal weakness appreciated.  Able to complete sit/stand, basic transfers and gait (100-120') both with and without RW, sup/mod indep; no significant change in gait performance or level of assist with use of RW.  Patient preferring gait without and demonstrates adequate safety, gait speed required to complete.  Do not feel assist is necessary at this time. HR and BP stable and WFL; no reports of dizziness or orthostasis.  Did note desaturation to 83-84% with exertion, requiring 45-60 seconds of seated rest for recovery to baseline.  RN informed/aware. Patient does state she has oxygen in the home and wears as needed.  Reviewed importance of use, value of energy conservation and activity pacing. Patient voiced understanding of all information. Appears at baseline level of functional ability; no acute PT needs identified at this time. Will complete initial order; please re-consult should needs change.    Follow Up Recommendations No PT follow up    Equipment Recommendations       Recommendations for Other Services       Precautions / Restrictions Precautions Precautions: Fall Restrictions Weight Bearing Restrictions: No      Mobility  Bed Mobility               General bed mobility comments: seated in recliner beginning/end of treatment session  Transfers Overall transfer level: Needs assistance Equipment used: Rolling walker (2 wheeled) Transfers: Sit  to/from Stand Sit to Stand: Modified independent (Device/Increase time)            Ambulation/Gait Ambulation/Gait assistance: Supervision Ambulation Distance (Feet): 120 Feet Assistive device: None   Gait velocity: 10' walk time, 6-7 seconds   General Gait Details: reciprocal stepping pattern with good foot placement, gait mechanics; no LOB or significant safety concern  Stairs            Wheelchair Mobility    Modified Rankin (Stroke Patients Only)       Balance Overall balance assessment: Needs assistance Sitting-balance support: No upper extremity supported;Feet supported Sitting balance-Leahy Scale: Good     Standing balance support: Bilateral upper extremity supported Standing balance-Leahy Scale: Good                               Pertinent Vitals/Pain Pain Assessment: No/denies pain    Home Living Family/patient expects to be discharged to:: Private residence Living Arrangements: Spouse/significant other Available Help at Discharge: Family Type of Home: House Home Access: Level entry     Home Layout: One level Home Equipment: Environmental consultant - 2 wheels;Cane - single point      Prior Function Level of Independence: Independent         Comments: Indep with ADLs, household and community mobility without assist device; + driving; caregiver for 66 year old great grand-daughter.  Recently using SPC/RW in past couple weeks due to progressive weakness     Hand Dominance        Extremity/Trunk Assessment   Upper Extremity Assessment Upper Extremity Assessment: Overall WFL for tasks assessed  Lower Extremity Assessment Lower Extremity Assessment: Overall WFL for tasks assessed       Communication   Communication: No difficulties  Cognition Arousal/Alertness: Awake/alert Behavior During Therapy: WFL for tasks assessed/performed Overall Cognitive Status: Within Functional Limits for tasks assessed                                         General Comments      Exercises Other Exercises Other Exercises: 100' without assist device, close sup-no change in gait pattern, mechanics or overall safety noted.  Slight increase in gait speed and overall fluidity without use of assist device (10' walk time, 5-6 seconds)   Assessment/Plan    PT Assessment Patent does not need any further PT services  PT Problem List         PT Treatment Interventions      PT Goals (Current goals can be found in the Care Plan section)  Acute Rehab PT Goals Patient Stated Goal: to return home PT Goal Formulation: All assessment and education complete, DC therapy    Frequency Min 2X/week   Barriers to discharge        Co-evaluation               AM-PAC PT "6 Clicks" Daily Activity  Outcome Measure Difficulty turning over in bed (including adjusting bedclothes, sheets and blankets)?: None Difficulty moving from lying on back to sitting on the side of the bed? : None Difficulty sitting down on and standing up from a chair with arms (e.g., wheelchair, bedside commode, etc,.)?: None Help needed moving to and from a bed to chair (including a wheelchair)?: None Help needed walking in hospital room?: None Help needed climbing 3-5 steps with a railing? : A Little 6 Click Score: 23    End of Session Equipment Utilized During Treatment: Gait belt Activity Tolerance: Patient tolerated treatment well Patient left: in chair;with call bell/phone within reach;with chair alarm set Nurse Communication: Mobility status PT Visit Diagnosis: Difficulty in walking, not elsewhere classified (R26.2)    Time: 1962-2297 PT Time Calculation (min) (ACUTE ONLY): 20 min   Charges:   PT Evaluation $PT Eval Low Complexity: 1 Procedure PT Treatments $Gait Training: 8-22 mins   PT G Codes:        Drevin Ortner H. Owens Shark, PT, DPT, NCS 02/08/17, 10:37 AM [phone]

## 2017-02-10 DIAGNOSIS — K269 Duodenal ulcer, unspecified as acute or chronic, without hemorrhage or perforation: Secondary | ICD-10-CM | POA: Diagnosis not present

## 2017-02-10 DIAGNOSIS — D379 Neoplasm of uncertain behavior of digestive organ, unspecified: Secondary | ICD-10-CM | POA: Diagnosis not present

## 2017-02-10 DIAGNOSIS — E039 Hypothyroidism, unspecified: Secondary | ICD-10-CM | POA: Diagnosis not present

## 2017-02-10 DIAGNOSIS — N182 Chronic kidney disease, stage 2 (mild): Secondary | ICD-10-CM | POA: Diagnosis not present

## 2017-02-10 DIAGNOSIS — R10811 Right upper quadrant abdominal tenderness: Secondary | ICD-10-CM | POA: Diagnosis not present

## 2017-02-10 DIAGNOSIS — E860 Dehydration: Secondary | ICD-10-CM | POA: Diagnosis not present

## 2017-02-10 DIAGNOSIS — D649 Anemia, unspecified: Secondary | ICD-10-CM | POA: Diagnosis not present

## 2017-02-15 ENCOUNTER — Ambulatory Visit: Payer: Medicare Other | Admitting: Gastroenterology

## 2017-02-15 DIAGNOSIS — D509 Iron deficiency anemia, unspecified: Secondary | ICD-10-CM | POA: Diagnosis not present

## 2017-02-15 DIAGNOSIS — E860 Dehydration: Secondary | ICD-10-CM | POA: Diagnosis not present

## 2017-02-16 NOTE — Progress Notes (Signed)
[facility]  [phone] [phone]  ID: Melissa Knox OB: [birth year]  MR#: [id number]  EVO#:[id number]  Patient Care Team: Ronnell Freshwater, NP as PCP - General (Family Medicine) Clent Jacks, RN as Registered Nurse  CHIEF COMPLAINT: Anemia in chronic renal disease, iron deficiency anemia.  INTERVAL HISTORY: Patient returns to clinic today for repeat laboratory work and further evaluation. She continues to have multiple medical complaints that are chronic and unchanged. She has persistent weakness and fatigue. She has no neurologic complaints. She denies any recent fevers. She denies any chest pain or shortness of breath. She denies any nausea, vomiting, constipation, or diarrhea. She has no melena or hematochezia. She has no urinary complaints. Patient offers no further specific complaints today.  REVIEW OF SYSTEMS:   Review of Systems  Constitutional: Positive for malaise/fatigue. Negative for fever and weight loss.  Respiratory: Negative.  Negative for cough, hemoptysis and shortness of breath.   Cardiovascular: Negative.  Negative for chest pain and leg swelling.  Gastrointestinal: Positive for abdominal pain. Negative for blood in stool and melena.  Genitourinary: Negative.   Musculoskeletal: Positive for back pain.  Skin: Negative.  Negative for rash.  Neurological: Positive for weakness.  Endo/Heme/Allergies: Does not bruise/bleed easily.  Psychiatric/Behavioral: The patient is nervous/anxious.     As per HPI. Otherwise, a complete review of systems is negative.  PAST MEDICAL HISTORY: Past Medical History:  Diagnosis Date  . Atherosclerotic heart disease 06/12/2015  . CKD (chronic kidney disease) stage 2, GFR 60-89 ml/min 06/12/2015  . Hypercalcemia 06/12/2015  . Hyperlipidemia 06/12/2015  . Hypertension 06/12/2015  . Hypothyroidism 06/12/2015  . Insomnia 06/12/2015  . 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 Shortness  of breath 06/12/2015  . Solitary pulmonary nodule 06/12/2015    PAST SURGICAL HISTORY: Past Surgical History:  Procedure Laterality Date  . GALLBLADDER SURGERY  2012    FAMILY HISTORY: Family History  Problem Relation Age of Onset  . Cancer Mother   . Hypertension Father   . Diabetes Father   . Cancer Father        ADVANCED DIRECTIVES (Y/N):  N   HEALTH MAINTENANCE: Social History  Substance Use Topics  . Smoking status: Former Research scientist (life sciences)  . Smokeless tobacco: Never Used  . Alcohol use No     Colonoscopy:  PAP:  Bone density:  Lipid panel:  No Known Allergies  Current Outpatient Prescriptions  Medication Sig Dispense Refill  . albuterol (PROVENTIL) (2.5 MG/3ML) 0.083% nebulizer solution Take 2.5 mg by nebulization every 6 (six) hours as needed for wheezing or shortness of breath.    Marland Kitchen amLODipine (NORVASC) 10 MG tablet Take 10 mg by mouth daily.  12  . atorvastatin (LIPITOR) 10 MG tablet Take 10 mg by mouth daily at 6 PM.     . budesonide-formoterol (SYMBICORT) 160-4.5 MCG/ACT inhaler Inhale 2 puffs into the lungs 2 (two) times daily.    Marland Kitchen diltiazem (CARDIZEM CD) 180 MG 24 hr capsule Take 180 mg by mouth daily.    . feeding supplement, ENSURE ENLIVE, (ENSURE ENLIVE) LIQD Take 237 mLs by mouth 3 (three) times daily between meals. 237 mL 12  . hydrALAZINE (APRESOLINE) 50 MG tablet Take 50 mg by mouth 2 (two) times daily.   6  . HYDROcodone-acetaminophen (NORCO/VICODIN) 5-325 MG tablet TAKE 1 TABLET BY MOUTH 3 TO 4 TIMES DAILY AS NEEDED FOR PAIN  0  . levothyroxine (SYNTHROID, LEVOTHROID) 50 MCG tablet Take 50 mcg by mouth daily before breakfast.    .  losartan (COZAAR) 100 MG tablet Take 100 mg by mouth daily.  3  . meclizine (ANTIVERT) 12.5 MG tablet Take 12.5 mg by mouth daily.     . mirtazapine (REMERON) 15 MG tablet Take 7.5 mg by mouth at bedtime.     . pantoprazole (PROTONIX) 40 MG tablet Take 1 tablet (40 mg total) by mouth 2 (two) times daily. 30 tablet 0  .  sucralfate (CARAFATE) 1 g tablet Take 1 tablet (1 g total) by mouth 4 (four) times daily -  with meals and at bedtime. 90 tablet 0   No current facility-administered medications for this visit.     OBJECTIVE: Vitals:   02/17/17 1405  BP: (!) 132/51  Pulse: 96  Temp: (!) 96.7 F (35.9 C)     Body mass index is 25.21 kg/m.    ECOG FS:0 - Asymptomatic  General: Well-developed, well-nourished, no acute distress. Eyes: Pink conjunctiva, anicteric sclera. Lungs: Clear to auscultation bilaterally. Heart: Regular rate and rhythm. No rubs, murmurs, or gallops. Abdomen: Soft, nontender, nondistended. No organomegaly noted, normoactive bowel sounds. Musculoskeletal: No edema, cyanosis, or clubbing. Neuro: Alert, answering all questions appropriately. Cranial nerves grossly intact. Skin: No rashes or petechiae noted. Psych: Normal affect.   LAB RESULTS:  Lab Results  Component Value Date   NA 138 02/08/2017   K 4.1 02/08/2017   CL 112 (H) 02/08/2017   CO2 23 02/08/2017   GLUCOSE 84 02/08/2017   BUN 9 02/08/2017   CREATININE 1.39 (H) 02/08/2017   CALCIUM 8.1 (L) 02/08/2017   PROT 6.9 02/07/2017   ALBUMIN 2.9 (L) 02/07/2017   AST 18 02/07/2017   ALT 11 (L) 02/07/2017   ALKPHOS 50 02/07/2017   BILITOT 0.6 02/07/2017   GFRNONAA 39 (L) 02/08/2017   GFRAA 45 (L) 02/08/2017    Lab Results  Component Value Date   WBC 6.1 02/17/2017   NEUTROABS 3.8 02/17/2017   HGB 9.8 (L) 02/17/2017   HCT 29.2 (L) 02/17/2017   MCV 81.5 02/17/2017   PLT 468 (H) 02/17/2017   Lab Results  Component Value Date   IRON 35 02/17/2017   TIBC 334 02/17/2017   IRONPCTSAT 11 02/17/2017   Lab Results  Component Value Date   FERRITIN 44 02/17/2017     STUDIES: Ct Abdomen Pelvis Wo Contrast  Result Date: 02/07/2017 CLINICAL DATA:  Patient with progressive weight loss and abdominal pain. Recent diagnosis duodenum ulcer. Anorexia and weight loss. Abdominal pain. EXAM: CT ABDOMEN AND PELVIS WITHOUT  CONTRAST TECHNIQUE: Multidetector CT imaging of the abdomen and pelvis was performed following the standard protocol without IV contrast. COMPARISON:  CT abdomen pelvis 01/05/2017; 12/03/2010. FINDINGS: Lower chest: Stable cardiac and mediastinal contours. Coronary arterial atherosclerosis. Bibasilar pulmonary fibrosis. Hepatobiliary: Liver is normal in size and contour. Patient status post cholecystectomy. Pancreas: Persistent fullness within the region the pancreatic head. No upstream pancreatic ductal dilatation. Spleen: Unremarkable Adrenals/Urinary Tract: Focal atrophy involving the superior and interpolar region the right kidney. No hydronephrosis. Urinary bladder is unremarkable. Stomach/Bowel: Circumferential wall thickening of the descending colon with surrounding fat stranding (image 39; series 2). Wall thickening of the transverse colon. Focal wall thickening involving the duodenum (image 44; series 6). Oral contrast material throughout the small large bowel. Normal morphology of the stomach. No free fluid or free intraperitoneal air. Vascular/Lymphatic: Normal caliber abdominal aorta. Dense calcified atherosclerotic plaque. No retroperitoneal lymphadenopathy. Reproductive: Uterus and adnexal structures are unremarkable. Other: None. Musculoskeletal: Lumbar spine degenerative changes. No aggressive or acute appearing osseous lesions.  IMPRESSION: Interval development of circumferential wall thickening of the descending colon and distal transverse colon most compatible with colitis. Persistent fullness within the region the pancreatic head which is nonspecific in etiology. Underlying pancreatic head mass is not entirely excluded. In the nonacute setting, recommend further evaluation of the pancreatic parenchyma with MRI. Noncontrast MRI can be performed if patient is amenable to proceed IV contrast material. Focal thickening of the second portion of the duodenum likely corresponds with recently diagnosed  duodenum ulcer. Extensive aortic atherosclerosis. Electronically Signed   By: Lovey Newcomer M.D.   On: 02/07/2017 11:44    ASSESSMENT: Anemia in chronic renal disease, Iron deficiency anemia.  PLAN:    1. Iron deficiency anemia: Patient's hemoglobin remains decreased, but unchanged. Her iron stores are also decreased, but relatively stable. Patient has agreed to receive one infusion of IV Feraheme today.  Return to clinic in 3 months with repeat laboratory work, further evaluation, and consideration of IV iron.  2. Anemia in chronic renal disease: Stable. Monitor. Patient's hemoglobin is 9.8 today. If it remains persistently below 10.0, can consider Procrit in the future. 3. Chronic renal insufficiency: Continued evaluation and treatment per nephrology. 4. Hypertension: Continue monitoring and treatment per primary care.  Patient expressed understanding and was in agreement with this plan. She also understands that She can call clinic at any time with any questions, concerns, or complaints.    Lloyd Huger, MD 02/20/17 9:22 AM

## 2017-02-17 ENCOUNTER — Inpatient Hospital Stay: Payer: Medicare Other | Attending: Oncology

## 2017-02-17 ENCOUNTER — Inpatient Hospital Stay: Payer: Medicare Other

## 2017-02-17 ENCOUNTER — Inpatient Hospital Stay (HOSPITAL_BASED_OUTPATIENT_CLINIC_OR_DEPARTMENT_OTHER): Payer: Medicare Other | Admitting: Oncology

## 2017-02-17 VITALS — BP 132/51 | HR 96 | Temp 96.7°F | Wt 133.4 lb

## 2017-02-17 DIAGNOSIS — R531 Weakness: Secondary | ICD-10-CM | POA: Diagnosis not present

## 2017-02-17 DIAGNOSIS — I129 Hypertensive chronic kidney disease with stage 1 through stage 4 chronic kidney disease, or unspecified chronic kidney disease: Secondary | ICD-10-CM | POA: Insufficient documentation

## 2017-02-17 DIAGNOSIS — I700 Atherosclerosis of aorta: Secondary | ICD-10-CM | POA: Insufficient documentation

## 2017-02-17 DIAGNOSIS — E039 Hypothyroidism, unspecified: Secondary | ICD-10-CM | POA: Diagnosis not present

## 2017-02-17 DIAGNOSIS — D631 Anemia in chronic kidney disease: Secondary | ICD-10-CM | POA: Diagnosis not present

## 2017-02-17 DIAGNOSIS — R109 Unspecified abdominal pain: Secondary | ICD-10-CM | POA: Insufficient documentation

## 2017-02-17 DIAGNOSIS — Z87891 Personal history of nicotine dependence: Secondary | ICD-10-CM | POA: Diagnosis not present

## 2017-02-17 DIAGNOSIS — D509 Iron deficiency anemia, unspecified: Secondary | ICD-10-CM | POA: Insufficient documentation

## 2017-02-17 DIAGNOSIS — E785 Hyperlipidemia, unspecified: Secondary | ICD-10-CM | POA: Diagnosis not present

## 2017-02-17 DIAGNOSIS — D508 Other iron deficiency anemias: Secondary | ICD-10-CM

## 2017-02-17 DIAGNOSIS — G4700 Insomnia, unspecified: Secondary | ICD-10-CM | POA: Diagnosis not present

## 2017-02-17 DIAGNOSIS — R5383 Other fatigue: Secondary | ICD-10-CM

## 2017-02-17 DIAGNOSIS — R634 Abnormal weight loss: Secondary | ICD-10-CM | POA: Diagnosis not present

## 2017-02-17 DIAGNOSIS — N182 Chronic kidney disease, stage 2 (mild): Secondary | ICD-10-CM | POA: Insufficient documentation

## 2017-02-17 DIAGNOSIS — I2510 Atherosclerotic heart disease of native coronary artery without angina pectoris: Secondary | ICD-10-CM | POA: Diagnosis not present

## 2017-02-17 DIAGNOSIS — Z79899 Other long term (current) drug therapy: Secondary | ICD-10-CM | POA: Diagnosis not present

## 2017-02-17 DIAGNOSIS — Z8711 Personal history of peptic ulcer disease: Secondary | ICD-10-CM

## 2017-02-17 LAB — IRON AND TIBC
Iron: 35 ug/dL (ref 28–170)
Saturation Ratios: 11 % (ref 10.4–31.8)
TIBC: 334 ug/dL (ref 250–450)
UIBC: 299 ug/dL

## 2017-02-17 LAB — CBC WITH DIFFERENTIAL/PLATELET
Basophils Absolute: 0.1 10*3/uL (ref 0–0.1)
Basophils Relative: 1 %
Eosinophils Absolute: 0.3 10*3/uL (ref 0–0.7)
Eosinophils Relative: 4 %
HEMATOCRIT: 29.2 % — AB (ref 35.0–47.0)
HEMOGLOBIN: 9.8 g/dL — AB (ref 12.0–16.0)
LYMPHS ABS: 1.6 10*3/uL (ref 1.0–3.6)
LYMPHS PCT: 26 %
MCH: 27.3 pg (ref 26.0–34.0)
MCHC: 33.6 g/dL (ref 32.0–36.0)
MCV: 81.5 fL (ref 80.0–100.0)
MONO ABS: 0.4 10*3/uL (ref 0.2–0.9)
MONOS PCT: 7 %
NEUTROS ABS: 3.8 10*3/uL (ref 1.4–6.5)
NEUTROS PCT: 62 %
Platelets: 468 10*3/uL — ABNORMAL HIGH (ref 150–440)
RBC: 3.59 MIL/uL — ABNORMAL LOW (ref 3.80–5.20)
RDW: 16.6 % — AB (ref 11.5–14.5)
WBC: 6.1 10*3/uL (ref 3.6–11.0)

## 2017-02-17 LAB — FERRITIN: Ferritin: 44 ng/mL (ref 11–307)

## 2017-02-17 MED ORDER — SODIUM CHLORIDE 0.9 % IV SOLN
Freq: Once | INTRAVENOUS | Status: AC
  Administered 2017-02-17: 15:00:00 via INTRAVENOUS
  Filled 2017-02-17: qty 1000

## 2017-02-17 MED ORDER — FERUMOXYTOL INJECTION 510 MG/17 ML
510.0000 mg | Freq: Once | INTRAVENOUS | Status: AC
  Administered 2017-02-17: 510 mg via INTRAVENOUS
  Filled 2017-02-17: qty 17

## 2017-02-17 NOTE — Progress Notes (Signed)
Patient here today for follow up.  Patient states no new concerns today  

## 2017-02-22 DIAGNOSIS — I739 Peripheral vascular disease, unspecified: Secondary | ICD-10-CM | POA: Diagnosis not present

## 2017-02-22 DIAGNOSIS — I129 Hypertensive chronic kidney disease with stage 1 through stage 4 chronic kidney disease, or unspecified chronic kidney disease: Secondary | ICD-10-CM | POA: Diagnosis not present

## 2017-02-22 DIAGNOSIS — E039 Hypothyroidism, unspecified: Secondary | ICD-10-CM | POA: Diagnosis not present

## 2017-02-22 DIAGNOSIS — K315 Obstruction of duodenum: Secondary | ICD-10-CM | POA: Diagnosis not present

## 2017-02-22 DIAGNOSIS — I2510 Atherosclerotic heart disease of native coronary artery without angina pectoris: Secondary | ICD-10-CM | POA: Diagnosis not present

## 2017-02-22 DIAGNOSIS — N189 Chronic kidney disease, unspecified: Secondary | ICD-10-CM | POA: Diagnosis not present

## 2017-02-22 DIAGNOSIS — K869 Disease of pancreas, unspecified: Secondary | ICD-10-CM | POA: Diagnosis not present

## 2017-02-22 DIAGNOSIS — Z87891 Personal history of nicotine dependence: Secondary | ICD-10-CM | POA: Diagnosis not present

## 2017-02-22 DIAGNOSIS — K8689 Other specified diseases of pancreas: Secondary | ICD-10-CM | POA: Diagnosis not present

## 2017-02-22 DIAGNOSIS — Z8719 Personal history of other diseases of the digestive system: Secondary | ICD-10-CM | POA: Diagnosis not present

## 2017-02-22 DIAGNOSIS — R935 Abnormal findings on diagnostic imaging of other abdominal regions, including retroperitoneum: Secondary | ICD-10-CM | POA: Diagnosis not present

## 2017-02-22 DIAGNOSIS — J449 Chronic obstructive pulmonary disease, unspecified: Secondary | ICD-10-CM | POA: Diagnosis not present

## 2017-02-22 DIAGNOSIS — Z79899 Other long term (current) drug therapy: Secondary | ICD-10-CM | POA: Diagnosis not present

## 2017-02-22 DIAGNOSIS — R933 Abnormal findings on diagnostic imaging of other parts of digestive tract: Secondary | ICD-10-CM | POA: Diagnosis not present

## 2017-02-22 DIAGNOSIS — K222 Esophageal obstruction: Secondary | ICD-10-CM | POA: Diagnosis not present

## 2017-02-22 NOTE — Progress Notes (Signed)
  Oncology Nurse Navigator Documentation EUS report faxed to [name]. Pathology pending. Navigator Location: [facility] (02/22/17 1100)   )Navigator Encounter Type: Letter/Fax/Email (02/22/17 1100)                                 Coordination of Care: EUS (02/22/17 1100)                  Time Spent with Patient: 15 (02/22/17 1100)

## 2017-02-25 ENCOUNTER — Encounter: Payer: Self-pay | Admitting: *Deleted

## 2017-02-25 NOTE — Progress Notes (Signed)
Pathology results from 02/22/17 noted and faxed to [name].

## 2017-03-07 ENCOUNTER — Telehealth: Payer: Self-pay

## 2017-03-07 DIAGNOSIS — K269 Duodenal ulcer, unspecified as acute or chronic, without hemorrhage or perforation: Secondary | ICD-10-CM | POA: Diagnosis not present

## 2017-03-07 DIAGNOSIS — I10 Essential (primary) hypertension: Secondary | ICD-10-CM | POA: Diagnosis not present

## 2017-03-07 DIAGNOSIS — M5117 Intervertebral disc disorders with radiculopathy, lumbosacral region: Secondary | ICD-10-CM | POA: Diagnosis not present

## 2017-03-07 DIAGNOSIS — N182 Chronic kidney disease, stage 2 (mild): Secondary | ICD-10-CM | POA: Diagnosis not present

## 2017-03-07 DIAGNOSIS — J449 Chronic obstructive pulmonary disease, unspecified: Secondary | ICD-10-CM | POA: Diagnosis not present

## 2017-03-07 DIAGNOSIS — D649 Anemia, unspecified: Secondary | ICD-10-CM | POA: Diagnosis not present

## 2017-03-07 NOTE — Telephone Encounter (Signed)
Advised patient that [name] and [name] spoke and agreed on her plan of care. Patient will be receiving calls and follow-up from [facility] from this point forward.

## 2017-03-08 DIAGNOSIS — I10 Essential (primary) hypertension: Secondary | ICD-10-CM | POA: Diagnosis not present

## 2017-03-08 DIAGNOSIS — D631 Anemia in chronic kidney disease: Secondary | ICD-10-CM | POA: Diagnosis not present

## 2017-03-08 DIAGNOSIS — N183 Chronic kidney disease, stage 3 (moderate): Secondary | ICD-10-CM | POA: Diagnosis not present

## 2017-03-08 DIAGNOSIS — R809 Proteinuria, unspecified: Secondary | ICD-10-CM | POA: Diagnosis not present

## 2017-03-18 ENCOUNTER — Other Ambulatory Visit: Payer: Self-pay

## 2017-03-22 DIAGNOSIS — M48061 Spinal stenosis, lumbar region without neurogenic claudication: Secondary | ICD-10-CM | POA: Diagnosis not present

## 2017-03-22 DIAGNOSIS — M5416 Radiculopathy, lumbar region: Secondary | ICD-10-CM | POA: Diagnosis not present

## 2017-03-22 DIAGNOSIS — Z6825 Body mass index (BMI) 25.0-25.9, adult: Secondary | ICD-10-CM | POA: Diagnosis not present

## 2017-03-22 DIAGNOSIS — I10 Essential (primary) hypertension: Secondary | ICD-10-CM | POA: Diagnosis not present

## 2017-04-25 DIAGNOSIS — J9611 Chronic respiratory failure with hypoxia: Secondary | ICD-10-CM | POA: Diagnosis not present

## 2017-04-25 DIAGNOSIS — R0602 Shortness of breath: Secondary | ICD-10-CM | POA: Diagnosis not present

## 2017-04-25 DIAGNOSIS — J449 Chronic obstructive pulmonary disease, unspecified: Secondary | ICD-10-CM | POA: Diagnosis not present

## 2017-04-25 DIAGNOSIS — J8410 Pulmonary fibrosis, unspecified: Secondary | ICD-10-CM | POA: Diagnosis not present

## 2017-04-25 DIAGNOSIS — R911 Solitary pulmonary nodule: Secondary | ICD-10-CM | POA: Diagnosis not present

## 2017-04-29 DIAGNOSIS — K869 Disease of pancreas, unspecified: Secondary | ICD-10-CM | POA: Diagnosis not present

## 2017-04-29 DIAGNOSIS — K8689 Other specified diseases of pancreas: Secondary | ICD-10-CM | POA: Diagnosis not present

## 2017-05-18 DIAGNOSIS — R0602 Shortness of breath: Secondary | ICD-10-CM | POA: Diagnosis not present

## 2017-05-25 ENCOUNTER — Other Ambulatory Visit: Payer: Self-pay | Admitting: *Deleted

## 2017-05-25 DIAGNOSIS — D649 Anemia, unspecified: Secondary | ICD-10-CM

## 2017-05-25 NOTE — Progress Notes (Signed)
[facility]  [phone]) [phone] [phone]) [phone]  ID: Melissa Knox OB: Feb 04, 1951  MR#: [id number]  EQA#:[id number]  Patient Care Team: Ronnell Freshwater, NP as PCP - General (Family Medicine) Clent Jacks, RN as Registered Nurse  CHIEF COMPLAINT: Anemia in chronic renal disease, iron deficiency anemia.  INTERVAL HISTORY: Patient returns to clinic today for repeat laboratory work and further evaluation. She continues to have chronic weakness and fatigue, but otherwise feels well. She has no neurologic complaints. She denies any recent fevers. She denies any chest pain or shortness of breath. She denies any nausea, vomiting, constipation, or diarrhea. She has no melena or hematochezia. She has no urinary complaints. Patient offers no further specific complaints today.  REVIEW OF SYSTEMS:   Review of Systems  Constitutional: Positive for malaise/fatigue. Negative for fever and weight loss.  Respiratory: Negative.  Negative for cough, hemoptysis and shortness of breath.   Cardiovascular: Negative.  Negative for chest pain and leg swelling.  Gastrointestinal: Negative.  Negative for abdominal pain, blood in stool and melena.  Genitourinary: Negative.   Musculoskeletal: Positive for back pain.  Skin: Negative.  Negative for rash.  Neurological: Positive for weakness.  Endo/Heme/Allergies: Does not bruise/bleed easily.  Psychiatric/Behavioral: Negative.  The patient is not nervous/anxious.     As per HPI. Otherwise, a complete review of systems is negative.  PAST MEDICAL HISTORY: Past Medical History:  Diagnosis Date  . Atherosclerotic heart disease 06/12/2015  . CKD (chronic kidney disease) stage 2, GFR 60-89 ml/min 06/12/2015  . Hypercalcemia 06/12/2015  . Hyperlipidemia 06/12/2015  . Hypertension 06/12/2015  . Hypothyroidism 06/12/2015  . Insomnia 06/12/2015  . 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 Shortness of breath 06/12/2015  . Solitary  pulmonary nodule 06/12/2015    PAST SURGICAL HISTORY: Past Surgical History:  Procedure Laterality Date  . GALLBLADDER SURGERY  2012    FAMILY HISTORY: Family History  Problem Relation Age of Onset  . Cancer Mother   . Hypertension Father   . Diabetes Father   . Cancer Father        ADVANCED DIRECTIVES (Y/N):  N   HEALTH MAINTENANCE: Social History  Substance Use Topics  . Smoking status: Former Research scientist (life sciences)  . Smokeless tobacco: Never Used  . Alcohol use No     Colonoscopy:  PAP:  Bone density:  Lipid panel:  No Known Allergies  Current Outpatient Prescriptions  Medication Sig Dispense Refill  . albuterol (PROVENTIL) (2.5 MG/3ML) 0.083% nebulizer solution Take 2.5 mg by nebulization every 6 (six) hours as needed for wheezing or shortness of breath.    Marland Kitchen amLODipine (NORVASC) 10 MG tablet Take 10 mg by mouth daily.  12  . atorvastatin (LIPITOR) 10 MG tablet Take 10 mg by mouth daily at 6 PM.     . budesonide-formoterol (SYMBICORT) 160-4.5 MCG/ACT inhaler Inhale 2 puffs into the lungs 2 (two) times daily.    Marland Kitchen diltiazem (CARDIZEM CD) 180 MG 24 hr capsule Take 180 mg by mouth daily.    . feeding supplement, ENSURE ENLIVE, (ENSURE ENLIVE) LIQD Take 237 mLs by mouth 3 (three) times daily between meals. 237 mL 12  . hydrALAZINE (APRESOLINE) 50 MG tablet Take 50 mg by mouth 2 (two) times daily.   6  . HYDROcodone-acetaminophen (NORCO/VICODIN) 5-325 MG tablet TAKE 1 TABLET BY MOUTH 3 TO 4 TIMES DAILY AS NEEDED FOR PAIN  0  . levothyroxine (SYNTHROID, LEVOTHROID) 50 MCG tablet Take 50 mcg by mouth daily before breakfast.    .  losartan (COZAAR) 100 MG tablet Take 100 mg by mouth daily.  3  . meclizine (ANTIVERT) 12.5 MG tablet Take 12.5 mg by mouth daily.     . mirtazapine (REMERON) 15 MG tablet Take 7.5 mg by mouth at bedtime.     . pantoprazole (PROTONIX) 40 MG tablet Take 1 tablet (40 mg total) by mouth 2 (two) times daily. 30 tablet 0  . sucralfate (CARAFATE) 1 g tablet Take 1  tablet (1 g total) by mouth 4 (four) times daily -  with meals and at bedtime. 90 tablet 0   No current facility-administered medications for this visit.     OBJECTIVE: Vitals:   05/26/17 1330  BP: (!) 148/61  Resp: 20  Temp: 97.8 F (36.6 C)     Body mass index is 25.94 kg/m.    ECOG FS:0 - Asymptomatic  General: Well-developed, well-nourished, no acute distress. Eyes: Pink conjunctiva, anicteric sclera. Lungs: Clear to auscultation bilaterally. Heart: Regular rate and rhythm. No rubs, murmurs, or gallops. Abdomen: Soft, nontender, nondistended. No organomegaly noted, normoactive bowel sounds. Musculoskeletal: No edema, cyanosis, or clubbing. Neuro: Alert, answering all questions appropriately. Cranial nerves grossly intact. Skin: No rashes or petechiae noted. Psych: Normal affect.   LAB RESULTS:  Lab Results  Component Value Date   NA 138 02/08/2017   K 4.1 02/08/2017   CL 112 (H) 02/08/2017   CO2 23 02/08/2017   GLUCOSE 84 02/08/2017   BUN 9 02/08/2017   CREATININE 1.39 (H) 02/08/2017   CALCIUM 8.1 (L) 02/08/2017   PROT 6.9 02/07/2017   ALBUMIN 2.9 (L) 02/07/2017   AST 18 02/07/2017   ALT 11 (L) 02/07/2017   ALKPHOS 50 02/07/2017   BILITOT 0.6 02/07/2017   GFRNONAA 39 (L) 02/08/2017   GFRAA 45 (L) 02/08/2017    Lab Results  Component Value Date   WBC 8.0 05/26/2017   NEUTROABS 5.4 05/26/2017   HGB 10.4 (L) 05/26/2017   HCT 30.6 (L) 05/26/2017   MCV 90.1 05/26/2017   PLT 426 05/26/2017   Lab Results  Component Value Date   IRON 31 05/26/2017   TIBC 320 05/26/2017   IRONPCTSAT 10 (L) 05/26/2017   Lab Results  Component Value Date   FERRITIN 49 05/26/2017     STUDIES: No results found.  ASSESSMENT: Anemia in chronic renal disease, Iron deficiency anemia.  PLAN:    1. Iron deficiency anemia: Patient's hemoglobin remains decreased, but unchanged. Her iron stores have improved. She does not require additional IV Feraheme today. Return to clinic  in 3 months with repeat laboratory work, further evaluation, and consideration of IV iron.  2. Anemia in chronic renal disease: Stable. Monitor. Patient's hemoglobin is within 10.0, therefore she does not require Procrit.  3. Chronic renal insufficiency: Continued evaluation and treatment per nephrology. 4. Hypertension: Continue monitoring and treatment per primary care.  Patient expressed understanding and was in agreement with this plan. She also understands that She can call clinic at any time with any questions, concerns, or complaints.    Lloyd Huger, MD 05/27/17 5:29 PM

## 2017-05-26 ENCOUNTER — Inpatient Hospital Stay (HOSPITAL_BASED_OUTPATIENT_CLINIC_OR_DEPARTMENT_OTHER): Payer: Medicare Other | Admitting: Oncology

## 2017-05-26 ENCOUNTER — Inpatient Hospital Stay: Payer: Medicare Other | Attending: Oncology

## 2017-05-26 VITALS — BP 148/61 | Temp 97.8°F | Resp 20 | Wt 137.3 lb

## 2017-05-26 DIAGNOSIS — E785 Hyperlipidemia, unspecified: Secondary | ICD-10-CM

## 2017-05-26 DIAGNOSIS — R531 Weakness: Secondary | ICD-10-CM | POA: Insufficient documentation

## 2017-05-26 DIAGNOSIS — G4700 Insomnia, unspecified: Secondary | ICD-10-CM | POA: Diagnosis not present

## 2017-05-26 DIAGNOSIS — Z79899 Other long term (current) drug therapy: Secondary | ICD-10-CM

## 2017-05-26 DIAGNOSIS — D631 Anemia in chronic kidney disease: Secondary | ICD-10-CM

## 2017-05-26 DIAGNOSIS — Z809 Family history of malignant neoplasm, unspecified: Secondary | ICD-10-CM | POA: Insufficient documentation

## 2017-05-26 DIAGNOSIS — Z87891 Personal history of nicotine dependence: Secondary | ICD-10-CM

## 2017-05-26 DIAGNOSIS — I2510 Atherosclerotic heart disease of native coronary artery without angina pectoris: Secondary | ICD-10-CM | POA: Diagnosis not present

## 2017-05-26 DIAGNOSIS — R5383 Other fatigue: Secondary | ICD-10-CM | POA: Diagnosis not present

## 2017-05-26 DIAGNOSIS — E039 Hypothyroidism, unspecified: Secondary | ICD-10-CM

## 2017-05-26 DIAGNOSIS — D509 Iron deficiency anemia, unspecified: Secondary | ICD-10-CM

## 2017-05-26 DIAGNOSIS — N182 Chronic kidney disease, stage 2 (mild): Secondary | ICD-10-CM | POA: Insufficient documentation

## 2017-05-26 DIAGNOSIS — R911 Solitary pulmonary nodule: Secondary | ICD-10-CM | POA: Insufficient documentation

## 2017-05-26 DIAGNOSIS — I129 Hypertensive chronic kidney disease with stage 1 through stage 4 chronic kidney disease, or unspecified chronic kidney disease: Secondary | ICD-10-CM | POA: Diagnosis not present

## 2017-05-26 DIAGNOSIS — D508 Other iron deficiency anemias: Secondary | ICD-10-CM

## 2017-05-26 DIAGNOSIS — D649 Anemia, unspecified: Secondary | ICD-10-CM

## 2017-05-26 LAB — IRON AND TIBC
IRON: 31 ug/dL (ref 28–170)
Saturation Ratios: 10 % — ABNORMAL LOW (ref 10.4–31.8)
TIBC: 320 ug/dL (ref 250–450)
UIBC: 289 ug/dL

## 2017-05-26 LAB — CBC WITH DIFFERENTIAL/PLATELET
Basophils Absolute: 0.1 10*3/uL (ref 0–0.1)
Basophils Relative: 1 %
EOS PCT: 1 %
Eosinophils Absolute: 0.1 10*3/uL (ref 0–0.7)
HEMATOCRIT: 30.6 % — AB (ref 35.0–47.0)
Hemoglobin: 10.4 g/dL — ABNORMAL LOW (ref 12.0–16.0)
LYMPHS ABS: 2 10*3/uL (ref 1.0–3.6)
LYMPHS PCT: 25 %
MCH: 30.6 pg (ref 26.0–34.0)
MCHC: 34 g/dL (ref 32.0–36.0)
MCV: 90.1 fL (ref 80.0–100.0)
MONO ABS: 0.5 10*3/uL (ref 0.2–0.9)
Monocytes Relative: 6 %
Neutro Abs: 5.4 10*3/uL (ref 1.4–6.5)
Neutrophils Relative %: 67 %
Platelets: 426 10*3/uL (ref 150–440)
RBC: 3.4 MIL/uL — ABNORMAL LOW (ref 3.80–5.20)
RDW: 16.4 % — ABNORMAL HIGH (ref 11.5–14.5)
WBC: 8 10*3/uL (ref 3.6–11.0)

## 2017-05-26 LAB — FERRITIN: Ferritin: 49 ng/mL (ref 11–307)

## 2017-05-28 ENCOUNTER — Emergency Department: Payer: Medicare Other

## 2017-05-28 ENCOUNTER — Inpatient Hospital Stay
Admission: EM | Admit: 2017-05-28 | Discharge: 2017-05-30 | DRG: 291 | Disposition: A | Discharge status: Home / Self Care | Payer: Medicare Other | Attending: Internal Medicine | Admitting: Internal Medicine

## 2017-05-28 ENCOUNTER — Encounter: Payer: Self-pay | Admitting: Emergency Medicine

## 2017-05-28 ENCOUNTER — Other Ambulatory Visit: Payer: Self-pay

## 2017-05-28 DIAGNOSIS — I5033 Acute on chronic diastolic (congestive) heart failure: Secondary | ICD-10-CM | POA: Diagnosis present

## 2017-05-28 DIAGNOSIS — K869 Disease of pancreas, unspecified: Secondary | ICD-10-CM | POA: Diagnosis present

## 2017-05-28 DIAGNOSIS — J9621 Acute and chronic respiratory failure with hypoxia: Secondary | ICD-10-CM | POA: Diagnosis present

## 2017-05-28 DIAGNOSIS — E039 Hypothyroidism, unspecified: Secondary | ICD-10-CM | POA: Diagnosis present

## 2017-05-28 DIAGNOSIS — J9620 Acute and chronic respiratory failure, unspecified whether with hypoxia or hypercapnia: Secondary | ICD-10-CM | POA: Diagnosis not present

## 2017-05-28 DIAGNOSIS — Z87891 Personal history of nicotine dependence: Secondary | ICD-10-CM | POA: Diagnosis not present

## 2017-05-28 DIAGNOSIS — J441 Chronic obstructive pulmonary disease with (acute) exacerbation: Secondary | ICD-10-CM | POA: Diagnosis present

## 2017-05-28 DIAGNOSIS — Z66 Do not resuscitate: Secondary | ICD-10-CM | POA: Diagnosis present

## 2017-05-28 DIAGNOSIS — R911 Solitary pulmonary nodule: Secondary | ICD-10-CM | POA: Diagnosis present

## 2017-05-28 DIAGNOSIS — R9431 Abnormal electrocardiogram [ECG] [EKG]: Secondary | ICD-10-CM | POA: Diagnosis not present

## 2017-05-28 DIAGNOSIS — Z7951 Long term (current) use of inhaled steroids: Secondary | ICD-10-CM

## 2017-05-28 DIAGNOSIS — E876 Hypokalemia: Secondary | ICD-10-CM | POA: Diagnosis present

## 2017-05-28 DIAGNOSIS — J439 Emphysema, unspecified: Secondary | ICD-10-CM | POA: Diagnosis not present

## 2017-05-28 DIAGNOSIS — J9610 Chronic respiratory failure, unspecified whether with hypoxia or hypercapnia: Secondary | ICD-10-CM | POA: Diagnosis not present

## 2017-05-28 DIAGNOSIS — I509 Heart failure, unspecified: Secondary | ICD-10-CM

## 2017-05-28 DIAGNOSIS — I130 Hypertensive heart and chronic kidney disease with heart failure and stage 1 through stage 4 chronic kidney disease, or unspecified chronic kidney disease: Principal | ICD-10-CM | POA: Diagnosis present

## 2017-05-28 DIAGNOSIS — Z79899 Other long term (current) drug therapy: Secondary | ICD-10-CM

## 2017-05-28 DIAGNOSIS — R531 Weakness: Secondary | ICD-10-CM

## 2017-05-28 DIAGNOSIS — J449 Chronic obstructive pulmonary disease, unspecified: Secondary | ICD-10-CM | POA: Diagnosis present

## 2017-05-28 DIAGNOSIS — N182 Chronic kidney disease, stage 2 (mild): Secondary | ICD-10-CM | POA: Diagnosis present

## 2017-05-28 DIAGNOSIS — E785 Hyperlipidemia, unspecified: Secondary | ICD-10-CM | POA: Diagnosis present

## 2017-05-28 DIAGNOSIS — I2510 Atherosclerotic heart disease of native coronary artery without angina pectoris: Secondary | ICD-10-CM | POA: Diagnosis present

## 2017-05-28 DIAGNOSIS — I10 Essential (primary) hypertension: Secondary | ICD-10-CM | POA: Diagnosis not present

## 2017-05-28 DIAGNOSIS — R0902 Hypoxemia: Secondary | ICD-10-CM | POA: Diagnosis not present

## 2017-05-28 DIAGNOSIS — K8689 Other specified diseases of pancreas: Secondary | ICD-10-CM | POA: Diagnosis present

## 2017-05-28 DIAGNOSIS — I279 Pulmonary heart disease, unspecified: Secondary | ICD-10-CM | POA: Diagnosis present

## 2017-05-28 DIAGNOSIS — I700 Atherosclerosis of aorta: Secondary | ICD-10-CM | POA: Diagnosis not present

## 2017-05-28 LAB — BASIC METABOLIC PANEL
Anion gap: 11 (ref 5–15)
BUN: 12 mg/dL (ref 6–20)
CO2: 23 mmol/L (ref 22–32)
Calcium: 9.4 mg/dL (ref 8.9–10.3)
Chloride: 106 mmol/L (ref 101–111)
Creatinine, Ser: 1.44 mg/dL — ABNORMAL HIGH (ref 0.44–1.00)
GFR calc non Af Amer: 37 mL/min — ABNORMAL LOW (ref >60)
GFR, EST AFRICAN AMERICAN: 43 mL/min — AB (ref >60)
Glucose, Bld: 98 mg/dL (ref 65–99)
POTASSIUM: 3.1 mmol/L — AB (ref 3.5–5.1)
SODIUM: 140 mmol/L (ref 135–145)

## 2017-05-28 LAB — BLOOD GAS, ARTERIAL
ACID-BASE EXCESS: 1.3 mmol/L (ref 0.0–2.0)
Bicarbonate: 24.7 mmol/L (ref 20.0–28.0)
FIO2: 1
O2 Saturation: 99.5 %
PCO2 ART: 34 mmHg (ref 32.0–48.0)
PH ART: 7.47 — AB (ref 7.350–7.450)
Patient temperature: 37
pO2, Arterial: 153 mmHg — ABNORMAL HIGH (ref 83.0–108.0)

## 2017-05-28 LAB — CBC
HEMATOCRIT: 31.4 % — AB (ref 35.0–47.0)
Hemoglobin: 10.6 g/dL — ABNORMAL LOW (ref 12.0–16.0)
MCH: 31.1 pg (ref 26.0–34.0)
MCHC: 33.7 g/dL (ref 32.0–36.0)
MCV: 92.4 fL (ref 80.0–100.0)
Platelets: 347 10*3/uL (ref 150–440)
RBC: 3.4 MIL/uL — AB (ref 3.80–5.20)
RDW: 16.4 % — ABNORMAL HIGH (ref 11.5–14.5)
WBC: 8 10*3/uL (ref 3.6–11.0)

## 2017-05-28 LAB — COOXEMETRY PANEL
CARBOXYHEMOGLOBIN: 2.2 % — AB (ref 0.5–1.5)
METHEMOGLOBIN: 1 % (ref 0.0–1.5)
O2 Saturation: 99.4 %
TOTAL OXYGEN CONTENT: 95 mL/dL

## 2017-05-28 LAB — TROPONIN I: Troponin I: <0.03 ng/mL (ref <0.03)

## 2017-05-28 MED ORDER — FUROSEMIDE 10 MG/ML IJ SOLN
40.0000 mg | Freq: Two times a day (BID) | INTRAMUSCULAR | Status: DC
  Administered 2017-05-29 (×3): 40 mg via INTRAVENOUS
  Filled 2017-05-28 (×3): qty 4

## 2017-05-28 MED ORDER — ATORVASTATIN CALCIUM 10 MG PO TABS
10.0000 mg | ORAL_TABLET | Freq: Every day | ORAL | Status: DC
  Administered 2017-05-29: 10 mg via ORAL
  Filled 2017-05-28: qty 1

## 2017-05-28 MED ORDER — HYDRALAZINE HCL 50 MG PO TABS
50.0000 mg | ORAL_TABLET | Freq: Two times a day (BID) | ORAL | Status: DC
  Administered 2017-05-29 – 2017-05-30 (×4): 50 mg via ORAL
  Filled 2017-05-28 (×4): qty 1

## 2017-05-28 MED ORDER — LEVOTHYROXINE SODIUM 50 MCG PO TABS
50.0000 ug | ORAL_TABLET | Freq: Every day | ORAL | Status: DC
  Administered 2017-05-29 – 2017-05-30 (×2): 50 ug via ORAL
  Filled 2017-05-28 (×2): qty 1

## 2017-05-28 MED ORDER — METHYLPREDNISOLONE SODIUM SUCC 40 MG IJ SOLR
40.0000 mg | Freq: Two times a day (BID) | INTRAMUSCULAR | Status: DC
  Administered 2017-05-29 (×2): 40 mg via INTRAVENOUS
  Filled 2017-05-28 (×2): qty 1

## 2017-05-28 MED ORDER — ONDANSETRON HCL 4 MG/2ML IJ SOLN: 4.0000 mg | Freq: Four times a day (QID) | INTRAMUSCULAR | Status: DC | PRN

## 2017-05-28 MED ORDER — ENSURE ENLIVE PO LIQD
237.0000 mL | Freq: Three times a day (TID) | ORAL | Status: DC
  Administered 2017-05-29 – 2017-05-30 (×4): 237 mL via ORAL

## 2017-05-28 MED ORDER — MECLIZINE HCL 12.5 MG PO TABS
12.5000 mg | ORAL_TABLET | Freq: Every day | ORAL | Status: DC
  Administered 2017-05-29 – 2017-05-30 (×2): 12.5 mg via ORAL
  Filled 2017-05-28 (×2): qty 1

## 2017-05-28 MED ORDER — POLYETHYLENE GLYCOL 3350 17 G PO PACK: 17.0000 g | PACK | Freq: Every day | ORAL | Status: DC | PRN

## 2017-05-28 MED ORDER — IPRATROPIUM-ALBUTEROL 0.5-2.5 (3) MG/3ML IN SOLN: 3.0000 mL | RESPIRATORY_TRACT | Status: DC | PRN

## 2017-05-28 MED ORDER — ONDANSETRON HCL 4 MG PO TABS: 4.0000 mg | ORAL_TABLET | Freq: Four times a day (QID) | ORAL | Status: DC | PRN

## 2017-05-28 MED ORDER — POTASSIUM CHLORIDE CRYS ER 20 MEQ PO TBCR
40.0000 meq | EXTENDED_RELEASE_TABLET | Freq: Once | ORAL | Status: AC
  Administered 2017-05-29: 40 meq via ORAL
  Filled 2017-05-28: qty 2

## 2017-05-28 MED ORDER — IOPAMIDOL (ISOVUE-370) INJECTION 76%
60.0000 mL | Freq: Once | INTRAVENOUS | Status: AC | PRN
  Administered 2017-05-28: 60 mL via INTRAVENOUS

## 2017-05-28 MED ORDER — ENOXAPARIN SODIUM 40 MG/0.4ML ~~LOC~~ SOLN
40.0000 mg | SUBCUTANEOUS | Status: DC
  Administered 2017-05-29 (×2): 40 mg via SUBCUTANEOUS
  Filled 2017-05-28 (×2): qty 0.4

## 2017-05-28 MED ORDER — DILTIAZEM HCL ER COATED BEADS 180 MG PO CP24
180.0000 mg | ORAL_CAPSULE | Freq: Every day | ORAL | Status: DC
  Administered 2017-05-29 – 2017-05-30 (×2): 180 mg via ORAL
  Filled 2017-05-28 (×2): qty 1

## 2017-05-28 MED ORDER — SODIUM CHLORIDE 0.9 % IV BOLUS (SEPSIS)
1000.0000 mL | Freq: Once | INTRAVENOUS | Status: AC
  Administered 2017-05-28: 1000 mL via INTRAVENOUS

## 2017-05-28 MED ORDER — SUCRALFATE 1 G PO TABS
1.0000 g | ORAL_TABLET | Freq: Three times a day (TID) | ORAL | Status: DC
  Administered 2017-05-29 – 2017-05-30 (×6): 1 g via ORAL
  Filled 2017-05-28 (×6): qty 1

## 2017-05-28 MED ORDER — ACETAMINOPHEN 650 MG RE SUPP
650.0000 mg | Freq: Four times a day (QID) | RECTAL | Status: DC | PRN
Start: 2017-05-28 — End: 2017-05-30

## 2017-05-28 MED ORDER — ACETAMINOPHEN 325 MG PO TABS: 650.0000 mg | ORAL_TABLET | Freq: Four times a day (QID) | ORAL | Status: DC | PRN

## 2017-05-28 MED ORDER — PANTOPRAZOLE SODIUM 40 MG PO TBEC
40.0000 mg | DELAYED_RELEASE_TABLET | Freq: Two times a day (BID) | ORAL | Status: DC
  Administered 2017-05-29 – 2017-05-30 (×4): 40 mg via ORAL
  Filled 2017-05-28 (×4): qty 1

## 2017-05-28 MED ORDER — LOSARTAN POTASSIUM 50 MG PO TABS
100.0000 mg | ORAL_TABLET | Freq: Every day | ORAL | Status: DC
  Administered 2017-05-29 – 2017-05-30 (×2): 100 mg via ORAL
  Filled 2017-05-28 (×2): qty 2

## 2017-05-28 MED ORDER — ALBUTEROL SULFATE (2.5 MG/3ML) 0.083% IN NEBU: 2.5000 mg | INHALATION_SOLUTION | Freq: Four times a day (QID) | RESPIRATORY_TRACT | Status: DC | PRN

## 2017-05-28 NOTE — H&P (Signed)
PCP:   Ronnell Freshwater, NP   Chief Complaint:  Weakness  HPI: This is a 66 year old female with COPD, chronic respiratory failure, history of CHF , multiple comorbidities. She presents with complaints of feeling weak all week. She is a chronic cough that is nonproductive. She denies any wheeze, fever, chills, nausea, vomiting, chest pains. She is a chronically poor appetite. She denies abdominal pain. She finally came to ER because of the persistent weakness. In the ER chest x-ray revealed that the patient has CHF. The hospitalist have been asked to admit. She is on a nonrebreather in the ER.  Review of Systems:  The patient denies anorexia, fever, weight loss,, vision loss, decreased hearing, hoarseness, chest pain, syncope, dyspnea on exertion, peripheral edema, balance deficits, hemoptysis, abdominal pain, melena, hematochezia, severe indigestion/heartburn, hematuria, incontinence, genital sores, muscle weakness, suspicious skin lesions, transient blindness, difficulty walking, depression, unusual weight change, abnormal bleeding, enlarged lymph nodes, angioedema, and breast masses.  Past Medical History: Past Medical History:  Diagnosis Date  . Atherosclerotic heart disease 06/12/2015  . CKD (chronic kidney disease) stage 2, GFR 60-89 ml/min 06/12/2015  . Hypercalcemia 06/12/2015  . Hyperlipidemia 06/12/2015  . Hypertension 06/12/2015  . Hypothyroidism 06/12/2015  . Insomnia 06/12/2015  . 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 Shortness of breath 06/12/2015  . Solitary pulmonary nodule 06/12/2015   Past Surgical History:  Procedure Laterality Date  . GALLBLADDER SURGERY  2012    Medications: Prior to Admission medications   Medication Sig Start Date End Date Taking? Authorizing Provider  albuterol (PROVENTIL) (2.5 MG/3ML) 0.083% nebulizer solution Take 2.5 mg by nebulization every 6 (six) hours as needed for wheezing or shortness of breath.   Yes [provider]   amLODipine (NORVASC) 10 MG tablet Take 10 mg by mouth daily. 04/21/16  Yes [provider]  atorvastatin (LIPITOR) 10 MG tablet Take 10 mg by mouth daily at 6 PM.    Yes [provider]  budesonide-formoterol (SYMBICORT) 160-4.5 MCG/ACT inhaler Inhale 2 puffs into the lungs 2 (two) times daily.   Yes [provider]  diltiazem (CARDIZEM CD) 180 MG 24 hr capsule Take 180 mg by mouth daily.   Yes [provider]  feeding supplement, ENSURE ENLIVE, (ENSURE ENLIVE) LIQD Take 237 mLs by mouth 3 (three) times daily between meals. 02/08/17  Yes Mody, Ulice Bold, MD  hydrALAZINE (APRESOLINE) 50 MG tablet Take 50 mg by mouth 2 (two) times daily.  04/10/16  Yes [provider]  HYDROcodone-acetaminophen (NORCO/VICODIN) 5-325 MG tablet TAKE 1 TABLET BY MOUTH 3 TO 4 TIMES DAILY AS NEEDED FOR PAIN 01/20/17  Yes [provider]  levothyroxine (SYNTHROID, LEVOTHROID) 50 MCG tablet Take 50 mcg by mouth daily before breakfast.   Yes [provider]  losartan (COZAAR) 100 MG tablet Take 100 mg by mouth daily. 02/18/16  Yes [provider]  meclizine (ANTIVERT) 12.5 MG tablet Take 12.5 mg by mouth daily.  06/02/16  Yes [provider]  mirtazapine (REMERON) 15 MG tablet Take 7.5 mg by mouth at bedtime.    Yes [provider]  pantoprazole (PROTONIX) 40 MG tablet Take 1 tablet (40 mg total) by mouth 2 (two) times daily. 02/08/17  Yes Mody, Ulice Bold, MD  sucralfate (CARAFATE) 1 g tablet Take 1 tablet (1 g total) by mouth 4 (four) times daily -  with meals and at bedtime. 02/08/17  Yes Bettey Costa, MD    Allergies:  No Known Allergies  Social History:  reports that  she has quit smoking. She has never used smokeless tobacco. She reports that she does not drink alcohol or use drugs.  Family History: Family History  Problem Relation Age of Onset  . Cancer Mother   . Hypertension Father   . Diabetes Father   . Cancer Father     Physical  Exam: Vitals:   05/28/17 2100 05/28/17 2130 05/28/17 2200 05/28/17 2230  BP: (!) 135/51 (!) 142/59 136/60 (!) 136/51  Pulse: 96 87 87 90  Resp: (!) 24 (!) 23 (!) 25 (!) 26  Temp:      TempSrc:      SpO2: 93% 98% 98% 99%  Weight:      Height:        General:  Alert and oriented times three, well developed and nourished, On the nonrebreather Eyes: PERRLA, pink conjunctiva, no scleral icterus ENT: Moist oral mucosa, neck supple, no thyromegaly Lungs: Dry crackles, no wheeze, no crackles, no use of accessory muscles Cardiovascular: regular rate and rhythm, no regurgitation, no gallops, no murmurs. No carotid bruits, no JVD Abdomen: soft, positive BS, non-tender, non-distended, no organomegaly, not an acute abdomen GU: not examined Neuro: CN II - XII grossly intact, sensation intact Musculoskeletal: strength 5/5 all extremities, no clubbing, cyanosis or edema Skin: no rash, no subcutaneous crepitation, no decubitus Psych: appropriate patient   Labs on Admission:   Recent Labs  05/28/17 1440  NA 140  K 3.1*  CL 106  CO2 23  GLUCOSE 98  BUN 12  CREATININE 1.44*  CALCIUM 9.4   No results for input(s): AST, ALT, ALKPHOS, BILITOT, PROT, ALBUMIN in the last 72 hours. No results for input(s): LIPASE, AMYLASE in the last 72 hours.  Recent Labs  05/26/17 1256 05/28/17 1440  WBC 8.0 8.0  NEUTROABS 5.4  --   HGB 10.4* 10.6*  HCT 30.6* 31.4*  MCV 90.1 92.4  PLT 426 347    Recent Labs  05/28/17 1712  TROPONINI <0.03   Invalid input(s): POCBNP No results for input(s): DDIMER in the last 72 hours. No results for input(s): HGBA1C in the last 72 hours. No results for input(s): CHOL, HDL, LDLCALC, TRIG, CHOLHDL, LDLDIRECT in the last 72 hours. No results for input(s): TSH, T4TOTAL, T3FREE, THYROIDAB in the last 72 hours.  Invalid input(s): FREET3  Recent Labs  05/26/17 1256  FERRITIN 49  TIBC 320  IRON 31    Micro Results: No results found for this or any  previous visit (from the past 240 hour(s)).   Radiological Exams on Admission: Ct Head Wo Contrast  Result Date: 05/28/2017 CLINICAL DATA:  Weakness x1 week EXAM: CT HEAD WITHOUT CONTRAST TECHNIQUE: Contiguous axial images were obtained from the base of the skull through the vertex without intravenous contrast. COMPARISON:  None. FINDINGS: Brain: Chronic appearing small vessel ischemic disease of periventricular white matter and the centrum semiovale. Remote appearing right caudate head lacunar infarct. No hydrocephalus. Midline basal cisterns and fourth ventricle without effacement. No large vascular territory infarct. No acute intracranial mass, hemorrhage or midline shift. No extra-axial fluid collections. Vascular: Moderate atherosclerosis of the carotid siphons. No hyperdense vessels. Skull: Negative for fracture or focal lesion. Sinuses/Orbits: No acute finding. Other: None. IMPRESSION: 1. Chronic appearing small vessel ischemic disease. Chronic right caudate head lacunar infarct. 2. No acute intracranial abnormality nor suspicious osseous findings. Electronically Signed   By: [name] Royalty M.D.   On: 05/28/2017 18:17   Ct Angio Chest Pe W And/or Wo Contrast  Result Date: 05/28/2017  CLINICAL DATA:  Hypoxia.  Weakness.  Pulmonary embolism suspected. EXAM: CT ANGIOGRAPHY CHEST WITH CONTRAST TECHNIQUE: Multidetector CT imaging of the chest was performed using the standard protocol during bolus administration of intravenous contrast. Multiplanar CT image reconstructions and MIPs were obtained to evaluate the vascular anatomy. CONTRAST:  60 cc Isovue 370 IV. COMPARISON:  Chest radiograph from earlier today. 08/13/2014 chest CT. FINDINGS: Cardiovascular: The study is high quality for the evaluation of pulmonary embolism. There are no filling defects in the central, lobar, segmental or subsegmental pulmonary artery branches to suggest acute pulmonary embolism. Atherosclerotic nonaneurysmal thoracic aorta.  Main pulmonary artery diameter 3.0 cm, top-normal. Borderline mild cardiomegaly. No significant pericardial fluid/thickening. Left anterior descending and right coronary atherosclerosis. Mediastinum/Nodes: No discrete thyroid nodules. Unremarkable esophagus. No axillary adenopathy. Newly mildly enlarged 1.3 cm right paratracheal node (series 4/ image 36). Newly mildly enlarged 1.0 cm subcarinal node (series 4/ image 46). Newly mildly enlarged 1.1 cm right hilar node (series 4/ image 44). Newly mildly enlarged 1.0 cm left hilar node (series 4/ image 47). Lungs/Pleura: No pneumothorax. No pleural effusion. No acute consolidative airspace disease. Severe centrilobular and paraseptal emphysema throughout both lungs with bullous emphysema at the lung apices and lung bases bilaterally. New ground-glass attenuation and prominent interlobular septal thickening throughout both lungs. Vague subsolid opacity in the superior segment right lower lobe (series 6/ image 36), cannot exclude underlying pulmonary nodule in this location. Upper abdomen: Stable scarring in the upper right kidney. No acute abnormality in the visualized upper abdomen. Musculoskeletal: No aggressive appearing focal osseous lesions. Mild thoracic spondylosis. Review of the MIP images confirms the above findings. IMPRESSION: 1. No pulmonary embolism . 2. Severe emphysema, including bullous emphysema at the lung apices and lung bases. 3. Spectrum of findings suggestive of congestive heart failure. Borderline mild cardiomegaly. New ground-glass attenuation and interlobular septal thickening throughout both lungs, suggesting pulmonary edema. 4. Vague sub solid opacity in the superior segment right lower lobe, cannot exclude underlying pulmonary nodule in this location. Recommend follow-up chest CT in 3 months. 5. New mild mediastinal and bilateral hilar adenopathy, nonspecific, probably reactive/congestive. These nodes can also be reassessed on follow-up chest  CT in 3 months. 6. Two vessel coronary atherosclerosis. Aortic Atherosclerosis (ICD10-I70.0) and Emphysema (ICD10-J43.9). Electronically Signed   By: Ilona Sorrel M.D.   On: 05/28/2017 20:06   Dg Chest Portable 1 View  Result Date: 05/28/2017 CLINICAL DATA:  Hypoxia. EXAM: PORTABLE CHEST 1 VIEW COMPARISON:  10/03/2012 FINDINGS: The heart size appears normal. Aortic atherosclerosis noted. There is no pleural effusion identified. Advanced changes of centrilobular and paraseptal emphysema again identified. Airspace opacity within the right mid lung is identified which may reflect superimposed pneumonia. IMPRESSION: 1. Right midlung opacity which may represent pneumonia. Followup PA and lateral chest X-ray is recommended in 3-4 weeks following trial of antibiotic therapy to ensure resolution and exclude underlying malignancy. 2. Aortic Atherosclerosis (ICD10-I70.0) and Emphysema (ICD10-J43.9). Electronically Signed   By: Kerby Moors M.D.   On: 05/28/2017 17:51    Assessment/Plan Present on Admission: Acute and chronic diastolic heart failure -Admit to med telemetry -Nonrebreather, duoneb, Lasix  -Strict I's and O's, daily weights -Patient most recent EF is 80%  Hypokalemia -REPLETING nothing by mouth, check magnesium level. BMP in AM  . CKD (chronic kidney disease) stage 2, GFR 60-89 ml/min -stable, currently baseline  . Severe COPD (chronic obstructive pulmonary disease) (HCC) -Solu-Medrol 40 grams IV every 12. Currently no significant wheezing  -Nebs when necessary  . Hyperlipidemia -  Stable, home meds resumed  . Hypertension -Stable, home meds resumed  . Hypothyroidism -Stable, home meds resumed  . Pancreatic mass -Outpatient workup, MRI at [facility] Oct 19th  . Solitary pulmonary nodule -Discussed with patient and son. Repeat CT in 3 months recommended   Natalina Wieting 05/28/2017, 11:21 PM

## 2017-05-28 NOTE — ED Notes (Signed)
Patient given food tray and ginger ale per MD order

## 2017-05-28 NOTE — ED Notes (Signed)
Pt transport to 249

## 2017-05-28 NOTE — ED Triage Notes (Signed)
Pt to ed with c/o weakness x 1 week. Pt states she is unable to walk without assistance.  Reports mild back pain as well.  Pt denies chest pain, denies sob. Reports mild headache.

## 2017-05-28 NOTE — ED Notes (Signed)
PT o2 level dropped to 50s, Dr. Cherylann Banas called to bedside. Nonrebreather placed on pt, pt oxygen immediately went up. Pt alert and oriented

## 2017-05-28 NOTE — ED Notes (Signed)
CT notified patient has appropriate IV access by Solmon Ice, RN

## 2017-05-28 NOTE — ED Provider Notes (Signed)
[facility] Emergency Department Provider Note ____________________________________________   First MD Initiated Contact with Patient 05/28/17 1637     (approximate)  I have reviewed the triage vital signs and the nursing notes.   HISTORY  Chief Complaint Weakness    HPI Melissa Knox is a 66 y.o. female with past history of hypertension, hyperlipidemia, hypothyroidism, GI bleed, iron deficiency anemia, and CKD, who presents with generalized weakness for the last week, gradual onset, worsening the last 1-2 days, and per the patient worse on her left side. Patient also reports some left-sided back pain over the last few days. Per her husband, patient is stumbling and has been unable to walk without assistance for the last week. Patient denies any significant headache, she denies any other focal weakness or numbness, and she denies shortness of breath, chest pain, lightheadedness, or fever. She reports mild cough, as well as one BM with small amt of blood.     Past Medical History:  Diagnosis Date  . Atherosclerotic heart disease 06/12/2015  . CKD (chronic kidney disease) stage 2, GFR 60-89 ml/min 06/12/2015  . Hypercalcemia 06/12/2015  . Hyperlipidemia 06/12/2015  . Hypertension 06/12/2015  . Hypothyroidism 06/12/2015  . Insomnia 06/12/2015  . 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 Shortness of breath 06/12/2015  . Solitary pulmonary nodule 06/12/2015    Patient Active Problem List   Diagnosis Date Noted  . Abdominal pain   . Acute blood loss anemia 02/06/2017  . Syncope 02/06/2017  . GI bleed 02/06/2017  . Pancreatic mass 02/06/2017  . Iron deficiency anemia 05/01/2016  . Anemia in chronic renal disease 04/26/2016  . Insomnia 06/12/2015  . CKD (chronic kidney disease) stage 2, GFR 60-89 ml/min 06/12/2015  . Hypercalcemia 06/12/2015  . Hypothyroidism 06/12/2015  . 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 Hyperlipidemia 06/12/2015  .  Hypertension 06/12/2015  . Atherosclerotic heart disease 06/12/2015  . Solitary pulmonary nodule 06/12/2015  . Shortness of breath 06/12/2015  . COPD (chronic obstructive pulmonary disease) ([place]) 06/12/2015    Past Surgical History:  Procedure Laterality Date  . GALLBLADDER SURGERY  2012    Prior to Admission medications   Medication Sig Start Date End Date Taking? Authorizing Provider  albuterol (PROVENTIL) (2.5 MG/3ML) 0.083% nebulizer solution Take 2.5 mg by nebulization every 6 (six) hours as needed for wheezing or shortness of breath.    [provider]  amLODipine (NORVASC) 10 MG tablet Take 10 mg by mouth daily. 04/21/16   [provider]  atorvastatin (LIPITOR) 10 MG tablet Take 10 mg by mouth daily at 6 PM.     [provider]  budesonide-formoterol (SYMBICORT) 160-4.5 MCG/ACT inhaler Inhale 2 puffs into the lungs 2 (two) times daily.    [provider]  diltiazem (CARDIZEM CD) 180 MG 24 hr capsule Take 180 mg by mouth daily.    [provider]  feeding supplement, ENSURE ENLIVE, (ENSURE ENLIVE) LIQD Take 237 mLs by mouth 3 (three) times daily between meals. 02/08/17   Bettey Costa, MD  hydrALAZINE (APRESOLINE) 50 MG tablet Take 50 mg by mouth 2 (two) times daily.  04/10/16   [provider]  HYDROcodone-acetaminophen (NORCO/VICODIN) 5-325 MG tablet TAKE 1 TABLET BY MOUTH 3 TO 4 TIMES DAILY AS NEEDED FOR PAIN 01/20/17   [provider]  levothyroxine (SYNTHROID, LEVOTHROID) 50 MCG tablet Take 50 mcg by mouth daily before breakfast.    [provider]  losartan (COZAAR) 100 MG tablet Take 100 mg by  mouth daily. 02/18/16   [provider]  meclizine (ANTIVERT) 12.5 MG tablet Take 12.5 mg by mouth daily.  06/02/16   [provider]  mirtazapine (REMERON) 15 MG tablet Take 7.5 mg by mouth at bedtime.     [provider]  pantoprazole (PROTONIX) 40 MG tablet Take 1 tablet (40 mg total) by mouth 2  (two) times daily. 02/08/17   Bettey Costa, MD  sucralfate (CARAFATE) 1 g tablet Take 1 tablet (1 g total) by mouth 4 (four) times daily -  with meals and at bedtime. 02/08/17   Bettey Costa, MD    Allergies Patient has no known allergies.  Family History  Problem Relation Age of Onset  . Cancer Mother   . Hypertension Father   . Diabetes Father   . Cancer Father     Social History Social History  Substance Use Topics  . Smoking status: Former Research scientist (life sciences)  . Smokeless tobacco: Never Used  . Alcohol use No    Review of Systems  Constitutional: No fever. Eyes: No visual changes. ENT: No sore throat. Cardiovascular: Denies chest pain. Respiratory: Denies shortness of breath. Gastrointestinal: No nausea, no vomiting.  Positive for 1x blood in stool.  Genitourinary: Negative for dysuria.  Musculoskeletal: Positive for back pain. Skin: Negative for rash. Neurological: Positive for generalized weakness.    ____________________________________________   PHYSICAL EXAM:  VITAL SIGNS: ED Triage Vitals  Enc Vitals Group     BP 05/28/17 1434 (!) 156/50     Pulse Rate 05/28/17 1434 (!) 117     Resp 05/28/17 1434 16     Temp 05/28/17 1434 99.8 F (37.7 C)     Temp Source 05/28/17 1434 Oral     SpO2 05/28/17 1434 96 %     Weight 05/28/17 1435 137 lb (62.1 kg)     Height 05/28/17 1435 5\' 1"  (1.549 m)     Head Circumference --      Peak Flow --      Pain Score 05/28/17 1443 5     Pain Loc --      Pain Edu? --      Excl. in [place]? --     Constitutional: Alert and oriented. Uncomfortable appearing.  Eyes: Conjunctivae are normal.  EOMI.  PERRLA.   Head: Atraumatic. Nose: No congestion/rhinnorhea. Mouth/Throat: Mucous membranes are moist.   Neck: Normal range of motion.  Cardiovascular: Tachycardic, regular rhythm. Grossly normal heart sounds.  Good peripheral circulation. Respiratory: Normal respiratory effort.  No retractions. Lungs CTAB. Gastrointestinal: Soft and nontender.  No distention.  Genitourinary: Mild L CVA tenderness.  Musculoskeletal: No lower extremity edema.  Extremities warm and well perfused.  Neurologic:  Normal speech and language. No gross focal neurologic deficits are appreciated.  5/5 motor strength and intact sensation to all extremities.  CNs III-XII intact. Skin:  Skin is warm and dry. No rash noted. Psychiatric: Mood and affect are normal. Speech and behavior are normal.  ____________________________________________   LABS (all labs ordered are listed, but only abnormal results are displayed)  Labs Reviewed  BASIC METABOLIC PANEL - Abnormal; Notable for the following:       Result Value   Potassium 3.1 (*)    Creatinine, Ser 1.44 (*)    GFR calc non Af Amer 37 (*)    GFR calc Af Amer 43 (*)    All other components within normal limits  CBC - Abnormal; Notable for the following:    RBC 3.40 (*)  Hemoglobin 10.6 (*)    HCT 31.4 (*)    RDW 16.4 (*)    All other components within normal limits  COOXEMETRY PANEL - Abnormal; Notable for the following:    Carboxyhemoglobin 2.2 (*)    All other components within normal limits  BLOOD GAS, ARTERIAL - Abnormal; Notable for the following:    pH, Arterial 7.47 (*)    pO2, Arterial 153 (*)    All other components within normal limits  CULTURE, BLOOD (ROUTINE X 2)  CULTURE, BLOOD (ROUTINE X 2)  TROPONIN I  URINALYSIS, COMPLETE (UACMP) WITH MICROSCOPIC   ____________________________________________  EKG  ED ECG REPORT I, Arta Silence, the attending physician, personally viewed and interpreted this ECG.  Date: 05/28/2017 EKG Time: 1440 Rate: 124 Rhythm: sinus tachycardia QRS Axis: normal Intervals: normal ST/T Wave abnormalities: nonspecific ST/T abnormality; approximate 1 mm ST depression in V5 to V6 and in lead II Narrative Interpretation: nonspecific changes  ____________________________________________  RADIOLOGY  CXR: R midlung opacity  CT head: no acute  findings  CT chest: severe emphysema, and cardiomegaly and ground glass opacity suggestive of pulm edema/CHF.  RLL opacity, likely nodule.  No focal consolidation.   ____________________________________________   PROCEDURES  Procedure(s) performed: No    Critical Care performed: Yes  CRITICAL CARE Performed by: Arta Silence   Total critical care time: 60 minutes  Critical care time was exclusive of separately billable procedures and treating other patients.  Critical care was necessary to treat or prevent imminent or life-threatening deterioration.  Critical care was time spent personally by me on the following activities: development of treatment plan with patient and/or surrogate as well as nursing, discussions with consultants, evaluation of patient's response to treatment, examination of patient, obtaining history from patient or surrogate, ordering and performing treatments and interventions, ordering and review of laboratory studies, ordering and review of radiographic studies, pulse oximetry and re-evaluation of patient's condition.  ____________________________________________   INITIAL IMPRESSION / ASSESSMENT AND PLAN / ED COURSE  Pertinent labs & imaging results that were available during my care of the patient were reviewed by me and considered in my medical decision making (see chart for details).  66 year old female with past medical history as noted presents with one week of generalized weakness, as well as perceived worsened weakness on her left side, and difficulty ambulating. Patient also reports left lower back pain over the last day. In the ED, patient is tachycardic, with borderline temperature, and initially otherwise normal vital signs. O2 sat was 96% when she arrived. On reassessment patient was found with an O2 sat in the 50s, although when I assessed her at this time she denied shortness of breath, palpitations, or any other change in her symptoms.  When placed on a nonrebreather O2 sat 1-100%; when we did a trial to take her off it again it went back down to very low levels with no symptoms. Exam otherwise only remarkable for mild left lower back tenderness. She has normal sensation and motor strength bilaterally to upper and lower extremities and no focal neuro deficits.  Overall, differential for patient's presentation is unclear. Initial labs reveal no significant anemia so I have no suspicion for acute GI bleed as cause of her symptoms. Differential for hypoxia is also unclear given that patient has no respiratory symptoms or chest pain. Consider pna, bronchitis, or other primary pulmonary cause.  Low susp for PE but will consider CT chest if other workup negative.  Also consider methemoglobinemia or CO exposure though  pt's husband is asymptomatic.  Ddx for the weakness also incl dehydration, electrolyte abnormality, infection, less likely cardiac.  Pt reports subjective asymmetrical weakness but no focal neuro findings on exam; given subacute sx do not susp stroke.  Will obtain CT head.  Plan for labs, CXR, infection workup, fluids, coox panel, VBG and reassess.    Clinical Course as of May 28 2029  Sat May 28, 2017  1810 Carboxyhemoglobin: (!) 2.2 [SS]    Clinical Course User Index [SS] Arta Silence, MD   ----------------------------------------- 8:25 PM on 05/28/2017 ----------------------------------------- Patient's lab workup including ABG and Cox panel not suggestive of any specific diagnosis.  Patient with no significant anemia.  CT head was negative. Chest x-ray showed possible right lung opacity. Given patient's significant hypoxia and the tachycardia, I was concerned for PE and this small opacity was not significant enough to fully explain patient's symptoms. CT chest was obtained to rule out PE, and is negative but reveals findings suggestive of possible CHF.   Patient remains with normal O2 sat on nonrebreather, but when  taken off for even a short time, she becomes significantly hypoxic again.  Plan for admission for the persistent hypoxia and workup for possible new onset CHF.    ____________________________________________   FINAL CLINICAL IMPRESSION(S) / ED DIAGNOSES  Final diagnoses:  Hypoxia  Generalized weakness      NEW MEDICATIONS STARTED DURING THIS VISIT:  New Prescriptions   No medications on file     Note:  This document was prepared using Dragon voice recognition software and may include unintentional dictation errors.    Arta Silence, MD 05/28/17 2030

## 2017-05-29 LAB — CBC
HEMATOCRIT: 30.4 % — AB (ref 35.0–47.0)
Hemoglobin: 10.4 g/dL — ABNORMAL LOW (ref 12.0–16.0)
MCH: 31.8 pg (ref 26.0–34.0)
MCHC: 34.3 g/dL (ref 32.0–36.0)
MCV: 92.8 fL (ref 80.0–100.0)
Platelets: 360 10*3/uL (ref 150–440)
RBC: 3.27 MIL/uL — ABNORMAL LOW (ref 3.80–5.20)
RDW: 16.5 % — AB (ref 11.5–14.5)
WBC: 7 10*3/uL (ref 3.6–11.0)

## 2017-05-29 LAB — BASIC METABOLIC PANEL
Anion gap: 8 (ref 5–15)
BUN: 13 mg/dL (ref 6–20)
CO2: 25 mmol/L (ref 22–32)
Calcium: 9.2 mg/dL (ref 8.9–10.3)
Chloride: 108 mmol/L (ref 101–111)
Creatinine, Ser: 1.33 mg/dL — ABNORMAL HIGH (ref 0.44–1.00)
GFR calc Af Amer: 47 mL/min — ABNORMAL LOW (ref >60)
GFR, EST NON AFRICAN AMERICAN: 41 mL/min — AB (ref >60)
GLUCOSE: 117 mg/dL — AB (ref 65–99)
POTASSIUM: 3.6 mmol/L (ref 3.5–5.1)
Sodium: 141 mmol/L (ref 135–145)

## 2017-05-29 MED ORDER — NON FORMULARY: 5.0000 mg | Freq: Once | Status: DC

## 2017-05-29 MED ORDER — MELATONIN 5 MG PO TABS
5.0000 mg | ORAL_TABLET | Freq: Once | ORAL | Status: AC
  Administered 2017-05-29: 5 mg via ORAL
  Filled 2017-05-29: qty 1

## 2017-05-29 NOTE — Progress Notes (Signed)
Advance care planning  Discussed with patient regarding her COPD and diastolic congestive heart failure with chronic respiratory failure. Reviewed patient's CT scan results with advanced emphysema. Along with other comorbidities patient was explained that she is at high risk for further complications. We discussed regarding intubation and CPR. Patient feels aggressive measures of resuscitation would not help her. Wants to be DO NOT RESUSCITATE/DO NOT INTUBATE. Orders entered.  Time spent 20 minutes

## 2017-05-29 NOTE — Progress Notes (Signed)
   05/29/17 0900  Clinical Encounter Type  Visited With Health care provider  Visit Type Initial  Referral From Nurse   [facility] office provides [place]; we are unable to provide mental health directives.

## 2017-05-29 NOTE — Progress Notes (Signed)
Patient is requesting for sleeping aid preferably Melatonin. Dr. Shirlean Mylar notified with a new order for Melatonin 5 mg  oral once. Will continue to monitor.

## 2017-05-29 NOTE — Progress Notes (Signed)
Pt alert and oriented x4, no complaints of pain or discomfort.  Bed in low position, call bell within reach.  Bed alarms on and functioning.  Assessment done and charted.  Will continue to monitor and do hourly rounding throughout the shift 

## 2017-05-29 NOTE — Progress Notes (Signed)
[facility] NAME: Melissa Knox    MR#:  [id number]  DATE OF BIRTH:  1951/06/18  SUBJECTIVE:  CHIEF COMPLAINT:   Chief Complaint  Patient presents with  . Weakness   Still has shortness of breath and orthopnea. Lower extremity edema improved.  REVIEW OF SYSTEMS:    Review of Systems  Constitutional: Positive for malaise/fatigue. Negative for chills and fever.  HENT: Negative for sore throat.   Eyes: Negative for blurred vision, double vision and pain.  Respiratory: Positive for shortness of breath. Negative for cough, hemoptysis and wheezing.   Cardiovascular: Positive for orthopnea and leg swelling. Negative for chest pain and palpitations.  Gastrointestinal: Negative for abdominal pain, constipation, diarrhea, heartburn, nausea and vomiting.  Genitourinary: Negative for dysuria and hematuria.  Musculoskeletal: Negative for back pain and joint pain.  Skin: Negative for rash.  Neurological: Positive for weakness. Negative for sensory change, speech change, focal weakness and headaches.  Endo/Heme/Allergies: Does not bruise/bleed easily.  Psychiatric/Behavioral: Negative for depression. The patient is not nervous/anxious.     DRUG ALLERGIES:  No Known Allergies  VITALS:  Blood pressure (!) 123/49, pulse 95, temperature 98.8 F (37.1 C), temperature source Oral, resp. rate 18, height 5\' 1"  (1.549 m), weight 59 kg (130 lb), SpO2 93 %.  PHYSICAL EXAMINATION:   Physical Exam  GENERAL:  66 y.o.-year-old patient lying in the bed with no acute distress.  EYES: Pupils equal, round, reactive to light and accommodation. No scleral icterus. Extraocular muscles intact.  HEENT: Head atraumatic, normocephalic. Oropharynx and nasopharynx clear.  NECK:  Supple, no jugular venous distention. No thyroid enlargement, no tenderness.  LUNGS Bilateral crackles CARDIOVASCULAR: S1, S2 normal. No murmurs, rubs, or gallops.  ABDOMEN: Soft,  nontender, nondistended. Bowel sounds present. No organomegaly or mass.  EXTREMITIES: No cyanosis, clubbing or edema b/l.    NEUROLOGIC: Cranial nerves II through XII are intact. No focal Motor or sensory deficits b/l.   PSYCHIATRIC: The patient is alert and oriented x 3.  SKIN: No obvious rash, lesion, or ulcer.   LABORATORY PANEL:   CBC  Recent Labs Lab 05/29/17 0536  WBC 7.0  HGB 10.4*  HCT 30.4*  PLT 360   ------------------------------------------------------------------------------------------------------------------ Chemistries   Recent Labs Lab 05/29/17 0536  NA 141  K 3.6  CL 108  CO2 25  GLUCOSE 117*  BUN 13  CREATININE 1.33*  CALCIUM 9.2   ------------------------------------------------------------------------------------------------------------------  Cardiac Enzymes  Recent Labs Lab 05/28/17 1712  TROPONINI <0.03   ------------------------------------------------------------------------------------------------------------------  RADIOLOGY:  Ct Head Wo Contrast  Result Date: 05/28/2017 CLINICAL DATA:  Weakness x1 week EXAM: CT HEAD WITHOUT CONTRAST TECHNIQUE: Contiguous axial images were obtained from the base of the skull through the vertex without intravenous contrast. COMPARISON:  None. FINDINGS: Brain: Chronic appearing small vessel ischemic disease of periventricular white matter and the centrum semiovale. Remote appearing right caudate head lacunar infarct. No hydrocephalus. Midline basal cisterns and fourth ventricle without effacement. No large vascular territory infarct. No acute intracranial mass, hemorrhage or midline shift. No extra-axial fluid collections. Vascular: Moderate atherosclerosis of the carotid siphons. No hyperdense vessels. Skull: Negative for fracture or focal lesion. Sinuses/Orbits: No acute finding. Other: None. IMPRESSION: 1. Chronic appearing small vessel ischemic disease. Chronic right caudate head lacunar infarct. 2. No  acute intracranial abnormality nor suspicious osseous findings. Electronically Signed   By: [name] Royalty M.D.   On: 05/28/2017 18:17   Ct Angio Chest Pe W And/or Wo Contrast  Result Date: 05/28/2017 CLINICAL DATA:  Hypoxia.  Weakness.  Pulmonary embolism suspected. EXAM: CT ANGIOGRAPHY CHEST WITH CONTRAST TECHNIQUE: Multidetector CT imaging of the chest was performed using the standard protocol during bolus administration of intravenous contrast. Multiplanar CT image reconstructions and MIPs were obtained to evaluate the vascular anatomy. CONTRAST:  60 cc Isovue 370 IV. COMPARISON:  Chest radiograph from earlier today. 08/13/2014 chest CT. FINDINGS: Cardiovascular: The study is high quality for the evaluation of pulmonary embolism. There are no filling defects in the central, lobar, segmental or subsegmental pulmonary artery branches to suggest acute pulmonary embolism. Atherosclerotic nonaneurysmal thoracic aorta. Main pulmonary artery diameter 3.0 cm, top-normal. Borderline mild cardiomegaly. No significant pericardial fluid/thickening. Left anterior descending and right coronary atherosclerosis. Mediastinum/Nodes: No discrete thyroid nodules. Unremarkable esophagus. No axillary adenopathy. Newly mildly enlarged 1.3 cm right paratracheal node (series 4/ image 36). Newly mildly enlarged 1.0 cm subcarinal node (series 4/ image 46). Newly mildly enlarged 1.1 cm right hilar node (series 4/ image 44). Newly mildly enlarged 1.0 cm left hilar node (series 4/ image 47). Lungs/Pleura: No pneumothorax. No pleural effusion. No acute consolidative airspace disease. Severe centrilobular and paraseptal emphysema throughout both lungs with bullous emphysema at the lung apices and lung bases bilaterally. New ground-glass attenuation and prominent interlobular septal thickening throughout both lungs. Vague subsolid opacity in the superior segment right lower lobe (series 6/ image 36), cannot exclude underlying pulmonary nodule  in this location. Upper abdomen: Stable scarring in the upper right kidney. No acute abnormality in the visualized upper abdomen. Musculoskeletal: No aggressive appearing focal osseous lesions. Mild thoracic spondylosis. Review of the MIP images confirms the above findings. IMPRESSION: 1. No pulmonary embolism . 2. Severe emphysema, including bullous emphysema at the lung apices and lung bases. 3. Spectrum of findings suggestive of congestive heart failure. Borderline mild cardiomegaly. New ground-glass attenuation and interlobular septal thickening throughout both lungs, suggesting pulmonary edema. 4. Vague sub solid opacity in the superior segment right lower lobe, cannot exclude underlying pulmonary nodule in this location. Recommend follow-up chest CT in 3 months. 5. New mild mediastinal and bilateral hilar adenopathy, nonspecific, probably reactive/congestive. These nodes can also be reassessed on follow-up chest CT in 3 months. 6. Two vessel coronary atherosclerosis. Aortic Atherosclerosis (ICD10-I70.0) and Emphysema (ICD10-J43.9). Electronically Signed   By: Ilona Sorrel M.D.   On: 05/28/2017 20:06   Dg Chest Portable 1 View  Result Date: 05/28/2017 CLINICAL DATA:  Hypoxia. EXAM: PORTABLE CHEST 1 VIEW COMPARISON:  10/03/2012 FINDINGS: The heart size appears normal. Aortic atherosclerosis noted. There is no pleural effusion identified. Advanced changes of centrilobular and paraseptal emphysema again identified. Airspace opacity within the right mid lung is identified which may reflect superimposed pneumonia. IMPRESSION: 1. Right midlung opacity which may represent pneumonia. Followup PA and lateral chest X-ray is recommended in 3-4 weeks following trial of antibiotic therapy to ensure resolution and exclude underlying malignancy. 2. Aortic Atherosclerosis (ICD10-I70.0) and Emphysema (ICD10-J43.9). Electronically Signed   By: Kerby Moors M.D.   On: 05/28/2017 17:51     ASSESSMENT AND PLAN:   Acute  and chronic diastolic heart failure With acute on chronic respiratory failure - IV Lasix, Beta blockers - Input and Output - Counseled to limit fluids and Salt - Monitor Bun/Cr and Potassium - Echo Reviewed -Cardiology follow up after discharge  Hypokalemia Replace orally  . CKD (chronic kidney disease) stage 2, GFR 60-89 ml/min -stable, currently baseline  . Severe COPD (chronic obstructive pulmonary disease) (HCC) No wheezing at this time.  Has chronic respiratory failure. Continue oxygen.  Marland Kitchen Hypertension -Stable, home meds resumed  . Hypothyroidism -Stable, home meds resumed  . Pancreatic mass -Outpatient workup, [facility] at [facility] Oct 19th  . Solitary pulmonary nodule - Patient aware. Repeat CT in 3 months recommended  All the records are reviewed and case discussed with Care Management/Social Workerr. Management plans discussed with the patient, family and they are in agreement.  CODE STATUS: DNR  DVT Prophylaxis: SCDs  TOTAL TIME TAKING CARE OF THIS PATIENT: 25 minutes.   POSSIBLE D/C IN 1-2 DAYS, DEPENDING ON CLINICAL CONDITION.  Hillary Bow R M.D on 05/29/2017 at 1:01 PM  Between 7am to 6pm - Pager - [phone]  After 6pm go to [email or web address] - password [email or web address] [facility]  Office  [phone]  CC: Primary care physician; Ronnell Freshwater, NP  Note: This dictation was prepared with Dragon dictation along with smaller phrase technology. Any transcriptional errors that result from this process are unintentional.

## 2017-05-29 NOTE — Progress Notes (Signed)
Patient is admitted to room 249 with the diagnosis of CHF. Alert and oriented x 4. Denied any acute pain at this moment. Skin assessment done with Leonette Monarch RN, no skin issues to report. Tele box called to CCMD by Cristopher Peru RN and Nallely NT. This RN reviewed living better with Heart Failure with the patient . Patient verbalized understanding of the teaching. Husband is  at the bedside voiced no concerns. Will continue to monitor.

## 2017-05-29 NOTE — Progress Notes (Signed)
Pain noted    0/10.

## 2017-05-29 NOTE — Progress Notes (Signed)
Patient was alert and understood the healthcare power of attorney information. Patient will contact [facility] office when ready to complete the paperwork. [facility] offered prayer and literature education.   05/29/17 1050  Clinical Encounter Type  Visited With Patient  Visit Type Follow-up;Spiritual support  Referral From Patient

## 2017-05-30 LAB — BASIC METABOLIC PANEL
ANION GAP: 8 (ref 5–15)
BUN: 35 mg/dL — ABNORMAL HIGH (ref 6–20)
CHLORIDE: 103 mmol/L (ref 101–111)
CO2: 27 mmol/L (ref 22–32)
Calcium: 9.6 mg/dL (ref 8.9–10.3)
Creatinine, Ser: 1.82 mg/dL — ABNORMAL HIGH (ref 0.44–1.00)
GFR, EST AFRICAN AMERICAN: 32 mL/min — AB (ref >60)
GFR, EST NON AFRICAN AMERICAN: 28 mL/min — AB (ref >60)
Glucose, Bld: 143 mg/dL — ABNORMAL HIGH (ref 65–99)
POTASSIUM: 3.8 mmol/L (ref 3.5–5.1)
SODIUM: 138 mmol/L (ref 135–145)

## 2017-05-30 LAB — MAGNESIUM: MAGNESIUM: 1.6 mg/dL — AB (ref 1.7–2.4)

## 2017-05-30 MED ORDER — FUROSEMIDE 20 MG PO TABS
20.0000 mg | ORAL_TABLET | Freq: Every day | ORAL | Status: DC
  Administered 2017-05-30: 20 mg via ORAL
  Filled 2017-05-30: qty 1

## 2017-05-30 MED ORDER — ENOXAPARIN SODIUM 30 MG/0.3ML ~~LOC~~ SOLN: 30.0000 mg | SUBCUTANEOUS | Status: DC

## 2017-05-30 MED ORDER — FUROSEMIDE 40 MG PO TABS: 40.0000 mg | ORAL_TABLET | Freq: Every day | ORAL | Status: DC

## 2017-05-30 MED ORDER — MAGNESIUM SULFATE 2 GM/50ML IV SOLN
2.0000 g | Freq: Once | INTRAVENOUS | Status: AC
  Administered 2017-05-30: 2 g via INTRAVENOUS
  Filled 2017-05-30: qty 50

## 2017-05-30 MED ORDER — FUROSEMIDE 20 MG PO TABS: 20.0000 mg | ORAL_TABLET | Freq: Every day | ORAL | 0 refills | Status: DC

## 2017-05-30 MED ORDER — CARVEDILOL 3.125 MG PO TABS: 3.1250 mg | ORAL_TABLET | Freq: Two times a day (BID) | ORAL | 1 refills | Status: DC

## 2017-05-30 MED ORDER — CARVEDILOL 3.125 MG PO TABS: 3.1250 mg | ORAL_TABLET | Freq: Two times a day (BID) | ORAL | Status: DC

## 2017-05-30 NOTE — Progress Notes (Signed)
Decreased prophylactic enoxaparin dose from 40mg  SQ once daily to 30mg  SQ once daily due to CrCl <41ml/min (26.67ml/min).   [place] Resident  05/30/17

## 2017-05-30 NOTE — Discharge Instructions (Signed)
Heart Failure Clinic appointment on June 07 2017 at 9:20am with Darylene Price, [place]. Please call [phone] to reschedule.

## 2017-05-30 NOTE — Discharge Summary (Addendum)
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 Atherosclerotic heart disease 06/12/2015  . CKD (chronic kidney disease) stage 2, GFR 60-89 ml/min 06/12/2015  . Hypercalcemia 06/12/2015  . Hyperlipidemia 06/12/2015  . Hypertension 06/12/2015  . Hypothyroidism 06/12/2015  . Insomnia 06/12/2015  . 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 Shortness of breath 06/12/2015  . Solitary pulmonary nodule 06/12/2015    HOSPITAL COURSE:  66 year old female with COPD, chronic respiratory failure, history of CHF , multiple comorbidities. She presents with complaints of feeling weak all week. She is a chronic cough that is nonproductive. She denies any wheeze, fever, chills, nausea, vomiting, chest pains. She is a chronically poor appetite.In the ER chest x-ray revealed that the patient has CHF  Acute and chronic diastolic heart failure With acute on chronic respiratory failure - IV Lasix, Beta blockers (started coreg 3.125 bid) -pt is appearing at baseline - Counseled to limit fluids and Salt - Monitor Bun/Cr and Potassium - Echo Reviewed -CHF clinic follow up after discharge -lasix 20 mg x3 days -pt is chronically on 2liter [place]  Hypokalemia Replace orally  . CKD (chronic kidney disease) stage 2, GFR 60-89 ml/min -stable, currentlybaseline -creat 1.8 -f/u Dr Holley Raring  . Severe COPD (chronic obstructive pulmonary disease) (HCC) No wheezing at this time. Has chronic respiratory failure. Continue oxygen.  Marland Kitchen Hypertension -Stable, home meds  resumed -cont amlodipine, d/ced Diltiazem  . Hypothyroidism -Stable, home meds resumed  . Pancreatic mass -Outpatient workup, MRI at [facility]  . Solitary pulmonary nodule - Patient aware. Repeat CT in 3 months recommended   D/c home today CONSULTS OBTAINED:    DRUG ALLERGIES:  No Known Allergies  DISCHARGE MEDICATIONS:   Current Discharge Medication List    START taking these medications   Details  furosemide (LASIX) 20 MG tablet Take 1 tablet (20 mg total) by mouth daily. For 3 days only Qty: 3 tablet, Refills: 0      CONTINUE these medications which have NOT CHANGED   Details  albuterol (PROVENTIL) (2.5 MG/3ML) 0.083% nebulizer solution Take 2.5 mg by nebulization every 6 (six) hours as needed for wheezing or shortness of breath.    amLODipine (NORVASC) 10 MG tablet Take 10 mg by mouth daily. Refills: 12   Associated Diagnoses: Anemia in chronic renal disease    atorvastatin (LIPITOR) 10 MG tablet Take 10 mg by mouth daily at 6 PM.     budesonide-formoterol (SYMBICORT) 160-4.5 MCG/ACT inhaler Inhale 2 puffs into the lungs 2 (two) times daily.    diltiazem (CARDIZEM CD) 180 MG 24 hr capsule Take 180 mg by mouth daily.    feeding supplement, ENSURE ENLIVE, (ENSURE ENLIVE) LIQD Take 237 mLs by mouth 3 (three) times daily between meals. Qty: 237 mL, Refills: 12    hydrALAZINE (APRESOLINE) 50 MG tablet Take 50 mg by mouth 2 (two) times daily.  Refills: 6   Associated Diagnoses: Anemia in chronic renal disease    HYDROcodone-acetaminophen (NORCO/VICODIN) 5-325 MG tablet TAKE 1 TABLET BY MOUTH 3 TO 4 TIMES DAILY AS NEEDED FOR PAIN  Refills: 0    levothyroxine (SYNTHROID, LEVOTHROID) 50 MCG tablet Take 50 mcg by mouth daily before breakfast.    losartan (COZAAR) 100 MG tablet Take 100 mg by mouth daily. Refills: 3   Associated Diagnoses: Anemia in chronic renal disease    meclizine (ANTIVERT) 12.5 MG tablet Take 12.5 mg by mouth daily.     mirtazapine  (REMERON) 15 MG tablet Take 7.5 mg by mouth at bedtime.     pantoprazole (PROTONIX) 40 MG tablet Take 1 tablet (40 mg total) by mouth 2 (two) times daily. Qty: 30 tablet, Refills: 0    sucralfate (CARAFATE) 1 g tablet Take 1 tablet (1 g total) by mouth 4 (four) times daily -  with meals and at bedtime. Qty: 90 tablet, Refills: 0        If you experience worsening of your admission symptoms, develop shortness of breath, life threatening emergency, suicidal or homicidal thoughts you must seek medical attention immediately by calling 911 or calling your MD immediately  if symptoms less severe.  You Must read complete instructions/literature along with all the possible adverse reactions/side effects for all the Medicines you take and that have been prescribed to you. Take any new Medicines after you have completely understood and accept all the possible adverse reactions/side effects.   Please note  You were cared for by a hospitalist during your hospital stay. If you have any questions about your discharge medications or the care you received while you were in the hospital after you are discharged, you can call the unit and asked to speak with the hospitalist on call if the hospitalist that took care of you is not available. Once you are discharged, your primary care physician will handle any further medical issues. Please note that NO REFILLS for any discharge medications will be authorized once you are discharged, as it is imperative that you return to your primary care physician (or establish a relationship with a primary care physician if you do not have one) for your aftercare needs so that they can reassess your need for medications and monitor your lab values. Today   SUBJECTIVE   Breathing improving VITAL SIGNS:  Blood pressure (!) 143/47, pulse 79, temperature (!) 97.5 F (36.4 C), temperature source Oral, resp. rate 20, height 5\' 1"  (1.549 m), weight 64.9 kg (143 lb), SpO2 91  %.  I/O:   Intake/Output Summary (Last 24 hours) at 05/30/17 1025 Last data filed at 05/30/17 0801  Gross per 24 hour  Intake              590 ml  Output              550 ml  Net               40 ml    PHYSICAL EXAMINATION:  GENERAL:  66 y.o.-year-old patient lying in the bed with no acute distress.  EYES: Pupils equal, round, reactive to light and accommodation. No scleral icterus. Extraocular muscles intact.  HEENT: Head atraumatic, normocephalic. Oropharynx and nasopharynx clear.  NECK:  Supple, no jugular venous distention. No thyroid enlargement, no tenderness.  LUNGS:decreased breath sounds bilaterally bases, no wheezing, rales,rhonchi or crepitation. No use of accessory muscles of respiration.  CARDIOVASCULAR: S1, S2 normal. No murmurs, rubs, or gallops.  ABDOMEN: Soft, non-tender, non-distended. Bowel sounds present. No organomegaly or mass.  EXTREMITIES: No pedal edema, cyanosis, or clubbing.  NEUROLOGIC: Cranial nerves II through XII are intact. Muscle strength 5/5 in all extremities.  Sensation intact. Gait not checked.  PSYCHIATRIC: The patient is alert and oriented x 3.  SKIN: No obvious rash, lesion, or ulcer.   DATA REVIEW:   CBC   Recent Labs Lab 05/29/17 0536  WBC 7.0  HGB 10.4*  HCT 30.4*  PLT 360    Chemistries   Recent Labs Lab 05/30/17 0345  NA 138  K 3.8  CL 103  CO2 27  GLUCOSE 143*  BUN 35*  CREATININE 1.82*  CALCIUM 9.6  MG 1.6*    Microbiology Results   Recent Results (from the past 240 hour(s))  Culture, blood (routine x 2)     Status: None (Preliminary result)   Collection Time: 05/28/17  6:57 PM  Result Value Ref Range Status   Specimen Description BLOOD RIGHT ANTECUBITAL  Final   Special Requests   Final    BOTTLES DRAWN AEROBIC AND ANAEROBIC Blood Culture adequate volume   Culture NO GROWTH 2 DAYS  Final   Report Status PENDING  Incomplete  Culture, blood (routine x 2)     Status: None (Preliminary result)   Collection  Time: 05/28/17  6:57 PM  Result Value Ref Range Status   Specimen Description BLOOD LEFT ANTECUBITAL  Final   Special Requests   Final    BOTTLES DRAWN AEROBIC AND ANAEROBIC Blood Culture adequate volume   Culture NO GROWTH 2 DAYS  Final   Report Status PENDING  Incomplete    RADIOLOGY:  Ct Head Wo Contrast  Result Date: 05/28/2017 CLINICAL DATA:  Weakness x1 week EXAM: CT HEAD WITHOUT CONTRAST TECHNIQUE: Contiguous axial images were obtained from the base of the skull through the vertex without intravenous contrast. COMPARISON:  None. FINDINGS: Brain: Chronic appearing small vessel ischemic disease of periventricular white matter and the centrum semiovale. Remote appearing right caudate head lacunar infarct. No hydrocephalus. Midline basal cisterns and fourth ventricle without effacement. No large vascular territory infarct. No acute intracranial mass, hemorrhage or midline shift. No extra-axial fluid collections. Vascular: Moderate atherosclerosis of the carotid siphons. No hyperdense vessels. Skull: Negative for fracture or focal lesion. Sinuses/Orbits: No acute finding. Other: None. IMPRESSION: 1. Chronic appearing small vessel ischemic disease. Chronic right caudate head lacunar infarct. 2. No acute intracranial abnormality nor suspicious osseous findings. Electronically Signed   By: [name] Royalty M.D.   On: 05/28/2017 18:17   Ct Angio Chest Pe W And/or Wo Contrast  Result Date: 05/28/2017 CLINICAL DATA:  Hypoxia.  Weakness.  Pulmonary embolism suspected. EXAM: CT ANGIOGRAPHY CHEST WITH CONTRAST TECHNIQUE: Multidetector CT imaging of the chest was performed using the standard protocol during bolus administration of intravenous contrast. Multiplanar CT image reconstructions and MIPs were obtained to evaluate the vascular anatomy. CONTRAST:  60 cc Isovue 370 IV. COMPARISON:  Chest radiograph from earlier today. 08/13/2014 chest CT. FINDINGS: Cardiovascular: The study is high quality for the  evaluation of pulmonary embolism. There are no filling defects in the central, lobar, segmental or subsegmental pulmonary artery branches to suggest acute pulmonary embolism. Atherosclerotic nonaneurysmal thoracic aorta. Main pulmonary artery diameter 3.0 cm, top-normal. Borderline mild cardiomegaly. No significant pericardial fluid/thickening. Left anterior descending and right coronary atherosclerosis. Mediastinum/Nodes: No discrete thyroid nodules. Unremarkable esophagus. No axillary adenopathy. Newly mildly enlarged 1.3 cm right paratracheal node (series 4/ image 36). Newly mildly enlarged 1.0 cm subcarinal node (series 4/ image 46). Newly mildly enlarged 1.1 cm right hilar node (series 4/ image 44). Newly mildly enlarged 1.0 cm left hilar node (series 4/ image 47). Lungs/Pleura: No pneumothorax. No  pleural effusion. No acute consolidative airspace disease. Severe centrilobular and paraseptal emphysema throughout both lungs with bullous emphysema at the lung apices and lung bases bilaterally. New ground-glass attenuation and prominent interlobular septal thickening throughout both lungs. Vague subsolid opacity in the superior segment right lower lobe (series 6/ image 36), cannot exclude underlying pulmonary nodule in this location. Upper abdomen: Stable scarring in the upper right kidney. No acute abnormality in the visualized upper abdomen. Musculoskeletal: No aggressive appearing focal osseous lesions. Mild thoracic spondylosis. Review of the MIP images confirms the above findings. IMPRESSION: 1. No pulmonary embolism . 2. Severe emphysema, including bullous emphysema at the lung apices and lung bases. 3. Spectrum of findings suggestive of congestive heart failure. Borderline mild cardiomegaly. New ground-glass attenuation and interlobular septal thickening throughout both lungs, suggesting pulmonary edema. 4. Vague sub solid opacity in the superior segment right lower lobe, cannot exclude underlying pulmonary  nodule in this location. Recommend follow-up chest CT in 3 months. 5. New mild mediastinal and bilateral hilar adenopathy, nonspecific, probably reactive/congestive. These nodes can also be reassessed on follow-up chest CT in 3 months. 6. Two vessel coronary atherosclerosis. Aortic Atherosclerosis (ICD10-I70.0) and Emphysema (ICD10-J43.9). Electronically Signed   By: Ilona Sorrel M.D.   On: 05/28/2017 20:06   Dg Chest Portable 1 View  Result Date: 05/28/2017 CLINICAL DATA:  Hypoxia. EXAM: PORTABLE CHEST 1 VIEW COMPARISON:  10/03/2012 FINDINGS: The heart size appears normal. Aortic atherosclerosis noted. There is no pleural effusion identified. Advanced changes of centrilobular and paraseptal emphysema again identified. Airspace opacity within the right mid lung is identified which may reflect superimposed pneumonia. IMPRESSION: 1. Right midlung opacity which may represent pneumonia. Followup PA and lateral chest X-ray is recommended in 3-4 weeks following trial of antibiotic therapy to ensure resolution and exclude underlying malignancy. 2. Aortic Atherosclerosis (ICD10-I70.0) and Emphysema (ICD10-J43.9). Electronically Signed   By: Kerby Moors M.D.   On: 05/28/2017 17:51     Management plans discussed with the patient, family and they are in agreement.  CODE STATUS:     Code Status Orders        Start     Ordered   05/29/17 1025  Do not attempt resuscitation (DNR)  Continuous    Question Answer Comment  In the event of cardiac or respiratory ARREST Do not call a "code blue"   In the event of cardiac or respiratory ARREST Do not perform Intubation, CPR, defibrillation or ACLS   In the event of cardiac or respiratory ARREST Use medication by any route, position, wound care, and other measures to relive pain and suffering. May use oxygen, suction and manual treatment of airway obstruction as needed for comfort.      05/29/17 1024    Code Status History    Date Active Date Inactive Code  Status Order ID Comments User Context   05/28/2017 11:23 PM 05/29/2017 10:24 AM Full Code [id number]  Quintella Baton, MD Inpatient   02/06/2017  3:25 PM 02/08/2017  2:04 PM Full Code [id number]  Idelle Crouch, MD ED      TOTAL TIME TAKING CARE OF THIS PATIENT: 40 minutes.    Etosha Wetherell M.D on 05/30/2017 at 10:25 AM  Between 7am to 6pm - Pager - [phone] After 6pm go to [email or web address] - password [email or web address] [facility]  Office  [phone]  CC: Primary care physician; Ronnell Freshwater, NP

## 2017-05-30 NOTE — Plan of Care (Signed)
Problem: Health Behavior/Discharge Planning: Goal: Ability to manage health-related needs will improve Outcome: Completed/Met Date Met: 05/30/17 Able to successfully manage her illness at home.

## 2017-05-30 NOTE — Progress Notes (Signed)
Patient provided heart failure education. Pt already had Living better w/ HF packet in room. Reviewed difference between preserved and reduced ejection fraction heart failure, importance of medications, side effects of medications. Reviewed medication changes made- stop dilitiazem and start carvediol BID. Lasix x 3 days. Reviewed low sodium diet (2g/day) importance of looking at niutrition label- importance/reasoning behind  fluid restriction (2L/day)  Reviewed reason/importance of daily weights, when to call doctor Pt informed of date and time of heart failure clinic appointment, why the appointment was made Patient encouraged to also follow with a cardiologist. All questions answered and patient was informed we could come back if she had any more questions  Johnice Riebe D Juel Ripley, Pharm.D, BCPS Clinical Pharmacist

## 2017-06-02 LAB — CULTURE, BLOOD (ROUTINE X 2)
CULTURE: NO GROWTH
Culture: NO GROWTH
SPECIAL REQUESTS: ADEQUATE
Special Requests: ADEQUATE

## 2017-06-06 DIAGNOSIS — J449 Chronic obstructive pulmonary disease, unspecified: Secondary | ICD-10-CM | POA: Diagnosis not present

## 2017-06-06 DIAGNOSIS — I2510 Atherosclerotic heart disease of native coronary artery without angina pectoris: Secondary | ICD-10-CM | POA: Diagnosis not present

## 2017-06-06 DIAGNOSIS — N182 Chronic kidney disease, stage 2 (mild): Secondary | ICD-10-CM | POA: Diagnosis not present

## 2017-06-06 DIAGNOSIS — M545 Low back pain: Secondary | ICD-10-CM | POA: Diagnosis not present

## 2017-06-06 DIAGNOSIS — Z23 Encounter for immunization: Secondary | ICD-10-CM | POA: Diagnosis not present

## 2017-06-06 DIAGNOSIS — I10 Essential (primary) hypertension: Secondary | ICD-10-CM | POA: Diagnosis not present

## 2017-06-06 DIAGNOSIS — R0602 Shortness of breath: Secondary | ICD-10-CM | POA: Diagnosis not present

## 2017-06-07 ENCOUNTER — Encounter: Payer: Self-pay | Admitting: Family

## 2017-06-07 ENCOUNTER — Ambulatory Visit: Payer: Medicare Other | Attending: Family | Admitting: Family

## 2017-06-07 VITALS — BP 82/59 | HR 77 | Resp 18 | Ht 61.0 in | Wt 133.5 lb

## 2017-06-07 DIAGNOSIS — Z9981 Dependence on supplemental oxygen: Secondary | ICD-10-CM | POA: Diagnosis not present

## 2017-06-07 DIAGNOSIS — E039 Hypothyroidism, unspecified: Secondary | ICD-10-CM | POA: Insufficient documentation

## 2017-06-07 DIAGNOSIS — I5032 Chronic diastolic (congestive) heart failure: Secondary | ICD-10-CM | POA: Insufficient documentation

## 2017-06-07 DIAGNOSIS — G4700 Insomnia, unspecified: Secondary | ICD-10-CM | POA: Diagnosis not present

## 2017-06-07 DIAGNOSIS — E785 Hyperlipidemia, unspecified: Secondary | ICD-10-CM | POA: Diagnosis not present

## 2017-06-07 DIAGNOSIS — J410 Simple chronic bronchitis: Secondary | ICD-10-CM

## 2017-06-07 DIAGNOSIS — J449 Chronic obstructive pulmonary disease, unspecified: Secondary | ICD-10-CM | POA: Insufficient documentation

## 2017-06-07 DIAGNOSIS — N182 Chronic kidney disease, stage 2 (mild): Secondary | ICD-10-CM | POA: Insufficient documentation

## 2017-06-07 DIAGNOSIS — Z87891 Personal history of nicotine dependence: Secondary | ICD-10-CM | POA: Diagnosis not present

## 2017-06-07 DIAGNOSIS — I2510 Atherosclerotic heart disease of native coronary artery without angina pectoris: Secondary | ICD-10-CM | POA: Insufficient documentation

## 2017-06-07 DIAGNOSIS — I10 Essential (primary) hypertension: Secondary | ICD-10-CM

## 2017-06-07 DIAGNOSIS — I130 Hypertensive heart and chronic kidney disease with heart failure and stage 1 through stage 4 chronic kidney disease, or unspecified chronic kidney disease: Secondary | ICD-10-CM | POA: Insufficient documentation

## 2017-06-07 NOTE — Progress Notes (Signed)
Patient ID: Melissa Knox, female    DOB: 07-Oct-1950, 66 y.o.   MRN: [id number]  HPI  Ms Melissa Knox is a 66 y/o female with a history of CAD, CKD, hyperlipidemia, HTN, hypothyroidism, COPD, pulmonary nodule, previous tobacco use and chronic heart failure.  Echo report from 10/04/16 reviewed and showed an EF of 80% along with a PA pressure of 33 mm Hg.  Admitted 05/28/17 due to acute on chronic HF and CKD. Initially needed IV lasix and transitioned to oral diuretics for a few days. Chronically on oxygen. Discharged home after 2 days.   She presents today for her initial visit with a chief complaint of minimal shortness of breath upon moderate exertion. She describes this as having been present for many years with varying levels of severity. She denies any fatigue, chest pain, wheezing, palpitations, edema or weight gain.  Past Medical History:  Diagnosis Date  . Atherosclerotic heart disease 06/12/2015  . CKD (chronic kidney disease) stage 2, GFR 60-89 ml/min 06/12/2015  . Hypercalcemia 06/12/2015  . Hyperlipidemia 06/12/2015  . Hypertension 06/12/2015  . Hypothyroidism 06/12/2015  . Insomnia 06/12/2015  . 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 Shortness of breath 06/12/2015  . Solitary pulmonary nodule 06/12/2015   Past Surgical History:  Procedure Laterality Date  . GALLBLADDER SURGERY  2012   Family History  Problem Relation Age of Onset  . Cancer Mother   . Hypertension Father   . Diabetes Father   . Cancer Father    Social History  Substance Use Topics  . Smoking status: Former Research scientist (life sciences)  . Smokeless tobacco: Never Used  . Alcohol use No   No Known Allergies Prior to Admission medications   Medication Sig Start Date End Date Taking? Authorizing Provider  albuterol (PROVENTIL) (2.5 MG/3ML) 0.083% nebulizer solution Take 2.5 mg by nebulization every 6 (six) hours as needed for wheezing or shortness of breath.   Yes [provider]  amLODipine (NORVASC) 10 MG tablet  Take 10 mg by mouth daily. 04/21/16  Yes [provider]  atorvastatin (LIPITOR) 10 MG tablet Take 10 mg by mouth daily at 6 PM.    Yes [provider]  budesonide-formoterol (SYMBICORT) 160-4.5 MCG/ACT inhaler Inhale 2 puffs into the lungs 2 (two) times daily.   Yes [provider]  carvedilol (COREG) 3.125 MG tablet Take 1 tablet (3.125 mg total) by mouth 2 (two) times daily with a meal. 05/30/17  Yes Fritzi Mandes, MD  feeding supplement, ENSURE ENLIVE, (ENSURE ENLIVE) LIQD Take 237 mLs by mouth 3 (three) times daily between meals. 02/08/17  Yes Mody, Ulice Bold, MD  furosemide (LASIX) 20 MG tablet Take 20 mg by mouth daily.   Yes [provider]  hydrALAZINE (APRESOLINE) 50 MG tablet Take 50 mg by mouth 2 (two) times daily.  04/10/16  Yes [provider]  HYDROcodone-acetaminophen (NORCO/VICODIN) 5-325 MG tablet TAKE 1 TABLET BY MOUTH 3 TO 4 TIMES DAILY AS NEEDED FOR PAIN 01/20/17  Yes [provider]  levothyroxine (SYNTHROID, LEVOTHROID) 50 MCG tablet Take 50 mcg by mouth daily before breakfast.   Yes [provider]  losartan (COZAAR) 100 MG tablet Take 100 mg by mouth daily. 02/18/16  Yes [provider]  meclizine (ANTIVERT) 12.5 MG tablet Take 12.5 mg by mouth daily.  06/02/16  Yes [provider]  mirtazapine (REMERON) 15 MG tablet Take 7.5 mg by mouth at bedtime.    Yes [provider]  pantoprazole (PROTONIX) 40 MG tablet Take 1 tablet (  40 mg total) by mouth 2 (two) times daily. 02/08/17  Yes Mody, Ulice Bold, MD  sucralfate (CARAFATE) 1 g tablet Take 1 tablet (1 g total) by mouth 4 (four) times daily -  with meals and at bedtime. 02/08/17  Yes Bettey Costa, MD    Review of Systems  Constitutional: Negative for appetite change and fatigue.  HENT: Positive for rhinorrhea. Negative for congestion and sore throat.   Eyes: Negative.   Respiratory: Positive for shortness of breath (with moderate exertion). Negative for  cough, chest tightness and wheezing.   Cardiovascular: Negative for chest pain, palpitations and leg swelling.  Gastrointestinal: Negative for abdominal distention and abdominal pain.  Endocrine: Negative.   Genitourinary: Negative.   Musculoskeletal: Positive for arthralgias (left hip pain) and back pain.  Skin: Negative.   Allergic/Immunologic: Negative.   Neurological: Negative for dizziness and light-headedness.  Hematological: Negative for adenopathy. Does not bruise/bleed easily.  Psychiatric/Behavioral: Negative for dysphoric mood and sleep disturbance (wearing oxygen @ 2L; sleeping on 1 pillow). The patient is not nervous/anxious.    Vitals:   06/07/17 0936 06/07/17 0937  BP: (!) 139/59 (!) 82/59  Pulse: 77   Resp: 18   SpO2: 96% 96%  Weight: 133 lb 8 oz (60.6 kg)   Height: 5\' 1"  (1.549 m)    Wt Readings from Last 3 Encounters:  06/07/17 133 lb 8 oz (60.6 kg)  05/30/17 143 lb (64.9 kg)  05/26/17 137 lb 4.8 oz (62.3 kg)   Lab Results  Component Value Date   CREATININE 1.82 (H) 05/30/2017   CREATININE 1.33 (H) 05/29/2017   CREATININE 1.44 (H) 05/28/2017   Physical Exam  Constitutional: She is oriented to person, place, and time. She appears well-developed and well-nourished.  HENT:  Head: Normocephalic and atraumatic.  Neck: Normal range of motion. Neck supple. No JVD present.  Cardiovascular: Normal rate and regular rhythm.   Pulmonary/Chest: Effort normal. She has no wheezes. She has no rales.  Abdominal: Soft. She exhibits no distension. There is no tenderness.  Musculoskeletal: She exhibits no edema or tenderness.  Neurological: She is alert and oriented to person, place, and time.  Skin: Skin is warm and dry.  Psychiatric: She has a normal mood and affect. Her behavior is normal. Thought content normal.  Nursing note and vitals reviewed.  Assessment & Plan:   1: Chronic heart failure with preserved ejection fraction- - NYHA class II - euvolemic today -  weighing daily. Instructed to call for an overnight weight gain of >2 pounds or a weekly weight gain of >5 pounds - not adding salt and has been reading food labels. Reviewed the importance of closely following a 2000mg  sodium diet and written dietary information was given to her about this. - has received her flu vaccine for the season already - sees PCP Junius Creamer) on 07/12/17 - PharmD reviewed medications with the patient  2: HTN- - BP quite a bit different in both arms; continue to monitor - BMP from 05/30/17 reviewed and shows sodium 138, potassium 3.8 and GFR 32. - follows with nephrology Holley Raring)  3: COPD-  - wears oxygen at 2L around the clock - follows with pulmonologist Humphrey Rolls)  Patient did not bring her medications nor a list. Each medication was verbally reviewed with the patient and she was encouraged to bring the bottles to every visit to confirm accuracy of list.  Return here in 1 month or sooner for any questions/problems before then.

## 2017-06-07 NOTE — Patient Instructions (Signed)
Continue weighing daily and call for an overnight weight gain of > 2 pounds or a weekly weight gain of >5 pounds. 

## 2017-06-13 DIAGNOSIS — N183 Chronic kidney disease, stage 3 (moderate): Secondary | ICD-10-CM | POA: Diagnosis not present

## 2017-06-13 DIAGNOSIS — I10 Essential (primary) hypertension: Secondary | ICD-10-CM | POA: Diagnosis not present

## 2017-06-13 DIAGNOSIS — D631 Anemia in chronic kidney disease: Secondary | ICD-10-CM | POA: Diagnosis not present

## 2017-06-17 DIAGNOSIS — R935 Abnormal findings on diagnostic imaging of other abdominal regions, including retroperitoneum: Secondary | ICD-10-CM | POA: Diagnosis not present

## 2017-06-17 DIAGNOSIS — D490 Neoplasm of unspecified behavior of digestive system: Secondary | ICD-10-CM | POA: Diagnosis not present

## 2017-06-17 DIAGNOSIS — I745 Embolism and thrombosis of iliac artery: Secondary | ICD-10-CM | POA: Diagnosis not present

## 2017-06-17 DIAGNOSIS — K869 Disease of pancreas, unspecified: Secondary | ICD-10-CM | POA: Diagnosis not present

## 2017-07-05 ENCOUNTER — Telehealth: Payer: Self-pay | Admitting: Family

## 2017-07-05 ENCOUNTER — Ambulatory Visit: Payer: Medicare Other | Admitting: Family

## 2017-07-05 NOTE — Telephone Encounter (Signed)
Patient did not show for her Heart Failure Clinic appointment on 07/05/17. Will attempt to reschedule.

## 2017-07-12 DIAGNOSIS — E039 Hypothyroidism, unspecified: Secondary | ICD-10-CM | POA: Diagnosis not present

## 2017-07-12 DIAGNOSIS — Z9981 Dependence on supplemental oxygen: Secondary | ICD-10-CM | POA: Diagnosis not present

## 2017-07-12 DIAGNOSIS — I10 Essential (primary) hypertension: Secondary | ICD-10-CM | POA: Diagnosis not present

## 2017-07-12 DIAGNOSIS — K269 Duodenal ulcer, unspecified as acute or chronic, without hemorrhage or perforation: Secondary | ICD-10-CM | POA: Diagnosis not present

## 2017-07-12 DIAGNOSIS — Z0001 Encounter for general adult medical examination with abnormal findings: Secondary | ICD-10-CM | POA: Diagnosis not present

## 2017-07-12 DIAGNOSIS — J449 Chronic obstructive pulmonary disease, unspecified: Secondary | ICD-10-CM | POA: Diagnosis not present

## 2017-07-15 ENCOUNTER — Ambulatory Visit: Payer: Medicare Other | Attending: Family | Admitting: Family

## 2017-07-15 ENCOUNTER — Other Ambulatory Visit: Payer: Self-pay

## 2017-07-15 ENCOUNTER — Encounter: Payer: Self-pay | Admitting: Family

## 2017-07-15 VITALS — BP 132/56 | HR 97 | Resp 18 | Ht 61.0 in | Wt 133.4 lb

## 2017-07-15 DIAGNOSIS — R911 Solitary pulmonary nodule: Secondary | ICD-10-CM | POA: Insufficient documentation

## 2017-07-15 DIAGNOSIS — J449 Chronic obstructive pulmonary disease, unspecified: Secondary | ICD-10-CM | POA: Insufficient documentation

## 2017-07-15 DIAGNOSIS — Z87891 Personal history of nicotine dependence: Secondary | ICD-10-CM | POA: Insufficient documentation

## 2017-07-15 DIAGNOSIS — I130 Hypertensive heart and chronic kidney disease with heart failure and stage 1 through stage 4 chronic kidney disease, or unspecified chronic kidney disease: Secondary | ICD-10-CM | POA: Insufficient documentation

## 2017-07-15 DIAGNOSIS — Z23 Encounter for immunization: Secondary | ICD-10-CM | POA: Insufficient documentation

## 2017-07-15 DIAGNOSIS — I10 Essential (primary) hypertension: Secondary | ICD-10-CM

## 2017-07-15 DIAGNOSIS — E039 Hypothyroidism, unspecified: Secondary | ICD-10-CM | POA: Diagnosis not present

## 2017-07-15 DIAGNOSIS — J410 Simple chronic bronchitis: Secondary | ICD-10-CM

## 2017-07-15 DIAGNOSIS — E785 Hyperlipidemia, unspecified: Secondary | ICD-10-CM | POA: Diagnosis not present

## 2017-07-15 DIAGNOSIS — N182 Chronic kidney disease, stage 2 (mild): Secondary | ICD-10-CM | POA: Insufficient documentation

## 2017-07-15 DIAGNOSIS — Z79899 Other long term (current) drug therapy: Secondary | ICD-10-CM | POA: Diagnosis not present

## 2017-07-15 DIAGNOSIS — G4700 Insomnia, unspecified: Secondary | ICD-10-CM | POA: Insufficient documentation

## 2017-07-15 DIAGNOSIS — I2510 Atherosclerotic heart disease of native coronary artery without angina pectoris: Secondary | ICD-10-CM | POA: Insufficient documentation

## 2017-07-15 DIAGNOSIS — I5032 Chronic diastolic (congestive) heart failure: Secondary | ICD-10-CM | POA: Insufficient documentation

## 2017-07-15 NOTE — Patient Instructions (Signed)
Continue weighing daily and call for an overnight weight gain of > 2 pounds or a weekly weight gain of >5 pounds. 

## 2017-07-15 NOTE — Progress Notes (Signed)
Patient ID: Melissa Knox, female    DOB: 1951-04-11, 66 y.o.   MRN: [id number]  HPI  Ms Vancamp is a 66 y/o female with a history of CAD, CKD, hyperlipidemia, HTN, hypothyroidism, COPD, pulmonary nodule, previous tobacco use and chronic heart failure.  Echo report from 10/04/16 reviewed and showed an EF of 80% along with a PA pressure of 33 mm Hg.  Admitted 05/28/17 due to acute on chronic HF and CKD. Initially needed IV lasix and transitioned to oral diuretics for a few days. Chronically on oxygen. Discharged home after 2 days.   She presents today for a follow-up visit with a chief complaint of mild shortness of breath upon moderate exertion. She describes this as chronic in nature having been present for several years with varying levels of severity. She has associated rhinorrhea with her chronic oxygen use. She denies any fatigue, chest pain, cough, edema, palpitations, dizziness, difficulty sleeping or weight gain.   Past Medical History:  Diagnosis Date  . Atherosclerotic heart disease 06/12/2015  . CKD (chronic kidney disease) stage 2, GFR 60-89 ml/min 06/12/2015  . Hypercalcemia 06/12/2015  . Hyperlipidemia 06/12/2015  . Hypertension 06/12/2015  . Hypothyroidism 06/12/2015  . Insomnia 06/12/2015  . 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 Shortness of breath 06/12/2015  . Solitary pulmonary nodule 06/12/2015   Past Surgical History:  Procedure Laterality Date  . GALLBLADDER SURGERY  2012   Family History  Problem Relation Age of Onset  . Cancer Mother   . Hypertension Father   . Diabetes Father   . Cancer Father    Social History   Tobacco Use  . Smoking status: Former Research scientist (life sciences)  . Smokeless tobacco: Never Used  Substance Use Topics  . Alcohol use: No    Alcohol/week: 0.0 oz   No Known Allergies  Prior to Admission medications   Medication Sig Start Date End Date Taking? Authorizing Provider  albuterol (PROVENTIL) (2.5 MG/3ML) 0.083% nebulizer solution Take 2.5  mg by nebulization every 6 (six) hours as needed for wheezing or shortness of breath.   Yes [provider]  amLODipine (NORVASC) 10 MG tablet Take 10 mg by mouth daily. 04/21/16  Yes [provider]  atorvastatin (LIPITOR) 10 MG tablet Take 10 mg by mouth daily at 6 PM.    Yes [provider]  budesonide-formoterol (SYMBICORT) 160-4.5 MCG/ACT inhaler Inhale 2 puffs into the lungs 2 (two) times daily.   Yes [provider]  carvedilol (COREG) 3.125 MG tablet Take 1 tablet (3.125 mg total) by mouth 2 (two) times daily with a meal. 05/30/17  Yes Fritzi Mandes, MD  feeding supplement, ENSURE ENLIVE, (ENSURE ENLIVE) LIQD Take 237 mLs by mouth 3 (three) times daily between meals. 02/08/17  Yes Mody, Ulice Bold, MD  furosemide (LASIX) 20 MG tablet Take 20 mg by mouth daily.   Yes [provider]  hydrALAZINE (APRESOLINE) 50 MG tablet Take 50 mg by mouth 2 (two) times daily.  04/10/16  Yes [provider]  HYDROcodone-acetaminophen (NORCO/VICODIN) 5-325 MG tablet TAKE 1 TABLET BY MOUTH 3 TO 4 TIMES DAILY AS NEEDED FOR PAIN 01/20/17  Yes [provider]  levothyroxine (SYNTHROID, LEVOTHROID) 50 MCG tablet Take 50 mcg by mouth daily before breakfast.   Yes [provider]  losartan (COZAAR) 100 MG tablet Take 100 mg by mouth daily. 02/18/16  Yes [provider]  meclizine (ANTIVERT) 12.5 MG tablet Take 12.5 mg by mouth daily.  06/02/16  Yes [provider]  mirtazapine (  REMERON) 15 MG tablet Take 7.5 mg by mouth at bedtime.    Yes [provider]  pantoprazole (PROTONIX) 40 MG tablet Take 1 tablet (40 mg total) by mouth 2 (two) times daily. 02/08/17  Yes Mody, Ulice Bold, MD  sucralfate (CARAFATE) 1 g tablet Take 1 tablet (1 g total) by mouth 4 (four) times daily -  with meals and at bedtime. 02/08/17  Yes Bettey Costa, MD    Review of Systems  Constitutional: Negative for appetite change and fatigue.  HENT: Positive for  rhinorrhea. Negative for congestion and sore throat.   Eyes: Negative.   Respiratory: Positive for shortness of breath (with moderate exertion). Negative for cough, chest tightness and wheezing.   Cardiovascular: Negative for chest pain, palpitations and leg swelling.  Gastrointestinal: Negative for abdominal distention and abdominal pain.  Endocrine: Negative.   Genitourinary: Negative.   Musculoskeletal: Positive for arthralgias (left hip pain) and back pain.  Skin: Negative.   Allergic/Immunologic: Negative.   Neurological: Negative for dizziness and light-headedness.  Hematological: Negative for adenopathy. Does not bruise/bleed easily.  Psychiatric/Behavioral: Negative for dysphoric mood and sleep disturbance (wearing oxygen @ 2L; sleeping on 1 pillow). The patient is not nervous/anxious.    Vitals:   07/15/17 0852  BP: (!) 132/56  Pulse: 97  Resp: 18  SpO2: 95%  Weight: 133 lb 6 oz (60.5 kg)  Height: 5\' 1"  (1.549 m)   Wt Readings from Last 3 Encounters:  07/15/17 133 lb 6 oz (60.5 kg)  06/07/17 133 lb 8 oz (60.6 kg)  05/30/17 143 lb (64.9 kg)    Lab Results  Component Value Date   CREATININE 1.82 (H) 05/30/2017   CREATININE 1.33 (H) 05/29/2017   CREATININE 1.44 (H) 05/28/2017   Physical Exam  Constitutional: She is oriented to person, place, and time. She appears well-developed and well-nourished.  HENT:  Head: Normocephalic and atraumatic.  Neck: Normal range of motion. Neck supple. No JVD present.  Cardiovascular: Normal rate and regular rhythm.  Pulmonary/Chest: Effort normal. She has no wheezes. She has no rales.  Abdominal: Soft. She exhibits no distension. There is no tenderness.  Musculoskeletal: She exhibits no edema or tenderness.  Neurological: She is alert and oriented to person, place, and time.  Skin: Skin is warm and dry.  Psychiatric: She has a normal mood and affect. Her behavior is normal. Thought content normal.  Nursing note and vitals  reviewed.  Assessment & Plan:   1: Chronic heart failure with preserved ejection fraction- - NYHA class II - euvolemic today - weighing daily and she says that her weight has been stable. Reminded to call for an overnight weight gain of >2 pounds or a weekly weight gain of >5 pounds - not adding salt and has been reading food labels. Reviewed the importance of closely following a 2000mg  sodium diet  - has received her flu vaccine for the season already - saw PCP Junius Creamer) on 07/12/17 - has been doing her housework and trying to increase her activity  2: HTN- - BP looks good today - BMP from 05/30/17 reviewed and shows sodium 138, potassium 3.8 and GFR 32. - follows with nephrology Holley Raring)  3: COPD-  - wears oxygen at 2L around the clock - follows with pulmonologist Humphrey Rolls) and sees him next week.   Patient did not bring her medications nor a list. Each medication was verbally reviewed with the patient and she was encouraged to bring the bottles to every visit to confirm accuracy of list.  Return in 6 months or sooner for any questions/problems before then.

## 2017-07-18 DIAGNOSIS — R0602 Shortness of breath: Secondary | ICD-10-CM | POA: Diagnosis not present

## 2017-07-18 DIAGNOSIS — J449 Chronic obstructive pulmonary disease, unspecified: Secondary | ICD-10-CM | POA: Diagnosis not present

## 2017-07-18 DIAGNOSIS — J9611 Chronic respiratory failure with hypoxia: Secondary | ICD-10-CM | POA: Diagnosis not present

## 2017-07-18 DIAGNOSIS — J8410 Pulmonary fibrosis, unspecified: Secondary | ICD-10-CM | POA: Diagnosis not present

## 2017-07-18 DIAGNOSIS — R911 Solitary pulmonary nodule: Secondary | ICD-10-CM | POA: Diagnosis not present

## 2017-08-12 ENCOUNTER — Other Ambulatory Visit: Payer: Self-pay

## 2017-08-12 DIAGNOSIS — D508 Other iron deficiency anemias: Secondary | ICD-10-CM

## 2017-08-16 NOTE — Progress Notes (Signed)
[facility]  [phone] [phone]  ID: Melissa Knox OB: 1950/12/28  MR#: [id number]  KKX#:[id number]  Patient Care Team: Ronnell Freshwater, NP as PCP - General (Family Medicine) Clent Jacks, RN as Registered Nurse  CHIEF COMPLAINT: Anemia in chronic renal disease, iron deficiency anemia.  INTERVAL HISTORY: Patient returns to clinic today for repeat laboratory work and further evaluation. She continues to have chronic weakness and fatigue, but otherwise feels well. She has no neurologic complaints. She denies any recent fevers. She denies any chest pain or shortness of breath. She denies any nausea, vomiting, constipation, or diarrhea. She has no melena or hematochezia. She has no urinary complaints. Patient offers no further specific complaints today.  REVIEW OF SYSTEMS:   Review of Systems  Constitutional: Positive for malaise/fatigue. Negative for fever and weight loss.  Respiratory: Negative.  Negative for cough, hemoptysis and shortness of breath.   Cardiovascular: Negative.  Negative for chest pain and leg swelling.  Gastrointestinal: Negative.  Negative for abdominal pain, blood in stool and melena.  Genitourinary: Negative.   Musculoskeletal: Positive for back pain.  Skin: Negative.  Negative for rash.  Neurological: Positive for weakness.  Endo/Heme/Allergies: Does not bruise/bleed easily.  Psychiatric/Behavioral: Negative.  The patient is not nervous/anxious.     As per HPI. Otherwise, a complete review of systems is negative.  PAST MEDICAL HISTORY: Past Medical History:  Diagnosis Date  . Atherosclerotic heart disease 06/12/2015  . CHF (congestive heart failure) ([place])   . CKD (chronic kidney disease) stage 2, GFR 60-89 ml/min 06/12/2015  . Hypercalcemia 06/12/2015  . Hyperlipidemia 06/12/2015  . Hypertension 06/12/2015  . Hypothyroidism 06/12/2015  . Insomnia 06/12/2015  . 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) 06/12/2015  .  Shortness of breath 06/12/2015  . Solitary pulmonary nodule 06/12/2015    PAST SURGICAL HISTORY: Past Surgical History:  Procedure Laterality Date  . GALLBLADDER SURGERY  2012    FAMILY HISTORY: Family History  Problem Relation Age of Onset  . Cancer Mother   . Hypertension Father   . Diabetes Father   . Cancer Father        ADVANCED DIRECTIVES (Y/N):  N   HEALTH MAINTENANCE: Social History   Tobacco Use  . Smoking status: Former Smoker    Last attempt to quit: 2013    Years since quitting: 5.9  . Smokeless tobacco: Never Used  Substance Use Topics  . Alcohol use: No    Alcohol/week: 0.0 oz  . Drug use: No     Colonoscopy:  PAP:  Bone density:  Lipid panel:  No Known Allergies  Current Outpatient Medications  Medication Sig Dispense Refill  . albuterol (PROVENTIL) (2.5 MG/3ML) 0.083% nebulizer solution Take 2.5 mg by nebulization every 6 (six) hours as needed for wheezing or shortness of breath.    Marland Kitchen amLODipine (NORVASC) 10 MG tablet Take 10 mg by mouth daily.  12  . atorvastatin (LIPITOR) 10 MG tablet Take 10 mg by mouth daily at 6 PM.     . budesonide-formoterol (SYMBICORT) 160-4.5 MCG/ACT inhaler Inhale 2 puffs into the lungs 2 (two) times daily.    . carvedilol (COREG) 3.125 MG tablet Take 1 tablet (3.125 mg total) by mouth 2 (two) times daily with a meal. 30 tablet 1  . feeding supplement, ENSURE ENLIVE, (ENSURE ENLIVE) LIQD Take 237 mLs by mouth 3 (three) times daily between meals. 237 mL 12  . furosemide (LASIX) 20 MG tablet Take 20 mg by mouth daily.    Marland Kitchen  hydrALAZINE (APRESOLINE) 50 MG tablet Take 50 mg by mouth 2 (two) times daily.   6  . HYDROcodone-acetaminophen (NORCO/VICODIN) 5-325 MG tablet TAKE 1 TABLET BY MOUTH 3 TO 4 TIMES DAILY AS NEEDED FOR PAIN  0  . levothyroxine (SYNTHROID, LEVOTHROID) 50 MCG tablet Take 50 mcg by mouth daily before breakfast.    . losartan (COZAAR) 100 MG tablet Take 100 mg by mouth daily.  3  . meclizine (ANTIVERT) 12.5  MG tablet Take 12.5 mg by mouth daily.     . mirtazapine (REMERON) 15 MG tablet Take 7.5 mg by mouth at bedtime.     . pantoprazole (PROTONIX) 40 MG tablet Take 1 tablet (40 mg total) by mouth 2 (two) times daily. 30 tablet 0  . sucralfate (CARAFATE) 1 g tablet Take 1 tablet (1 g total) by mouth 4 (four) times daily -  with meals and at bedtime. 90 tablet 0   No current facility-administered medications for this visit.     OBJECTIVE: Vitals:   08/18/17 1346  BP: (!) 160/65  Pulse: (!) 101  Resp: 18  Temp: (!) 97.1 F (36.2 C)     Body mass index is 25.32 kg/m.    ECOG FS:0 - Asymptomatic  General: Well-developed, well-nourished, no acute distress. Eyes: Pink conjunctiva, anicteric sclera. Lungs: Clear to auscultation bilaterally. Heart: Regular rate and rhythm. No rubs, murmurs, or gallops. Abdomen: Soft, nontender, nondistended. No organomegaly noted, normoactive bowel sounds. Musculoskeletal: No edema, cyanosis, or clubbing. Neuro: Alert, answering all questions appropriately. Cranial nerves grossly intact. Skin: No rashes or petechiae noted. Psych: Normal affect.   LAB RESULTS:  Lab Results  Component Value Date   NA 138 05/30/2017   K 3.8 05/30/2017   CL 103 05/30/2017   CO2 27 05/30/2017   GLUCOSE 143 (H) 05/30/2017   BUN 35 (H) 05/30/2017   CREATININE 1.82 (H) 05/30/2017   CALCIUM 9.6 05/30/2017   PROT 6.9 02/07/2017   ALBUMIN 2.9 (L) 02/07/2017   AST 18 02/07/2017   ALT 11 (L) 02/07/2017   ALKPHOS 50 02/07/2017   BILITOT 0.6 02/07/2017   GFRNONAA 28 (L) 05/30/2017   GFRAA 32 (L) 05/30/2017    Lab Results  Component Value Date   WBC 5.4 08/18/2017   NEUTROABS 3.3 08/18/2017   HGB 9.8 (L) 08/18/2017   HCT 30.3 (L) 08/18/2017   MCV 90.2 08/18/2017   PLT 369 08/18/2017   Lab Results  Component Value Date   IRON 35 08/18/2017   TIBC 320 08/18/2017   IRONPCTSAT 11 08/18/2017   Lab Results  Component Value Date   FERRITIN 31 08/18/2017      STUDIES: No results found.  ASSESSMENT: Anemia in chronic renal disease, Iron deficiency anemia.  PLAN:    1. Iron deficiency anemia: Patient's hemoglobin has slightly trended down, and her iron stores are borderline low.  Will proceed with 510 mg IV Feraheme today.  She does not require a second infusion.  Previously, the remainder of her laboratory work was either negative or within normal limits. Return to clinic in 3 months with repeat laboratory work, further evaluation, and consideration of IV iron.  2. Anemia in chronic renal disease: Stable. Monitor.  IV Feraheme as above.  Patient may require Procrit in the future her hemoglobin trends below 10.0 and her iron stores are adequately replaced.  3. Chronic renal insufficiency: Continued evaluation and treatment per nephrology. 4. Hypertension: Continue monitoring and treatment per primary care.  Patient expressed understanding and was in  agreement with this plan. She also understands that She can call clinic at any time with any questions, concerns, or complaints.    Lloyd Huger, MD 08/21/17 9:24 AM

## 2017-08-17 ENCOUNTER — Other Ambulatory Visit: Payer: Self-pay | Admitting: Nurse Practitioner

## 2017-08-17 MED ORDER — PANTOPRAZOLE SODIUM 40 MG PO TBEC: 40.0000 mg | DELAYED_RELEASE_TABLET | Freq: Two times a day (BID) | ORAL | 0 refills | Status: DC

## 2017-08-17 MED ORDER — CARVEDILOL 3.125 MG PO TABS: 3.1250 mg | ORAL_TABLET | Freq: Two times a day (BID) | ORAL | 1 refills | Status: DC

## 2017-08-18 ENCOUNTER — Inpatient Hospital Stay: Payer: Medicare Other | Attending: Oncology

## 2017-08-18 ENCOUNTER — Inpatient Hospital Stay (HOSPITAL_BASED_OUTPATIENT_CLINIC_OR_DEPARTMENT_OTHER): Payer: Medicare Other | Admitting: Oncology

## 2017-08-18 ENCOUNTER — Inpatient Hospital Stay: Payer: Medicare Other

## 2017-08-18 VITALS — BP 160/65 | HR 101 | Temp 97.1°F | Resp 18 | Wt 134.0 lb

## 2017-08-18 DIAGNOSIS — R531 Weakness: Secondary | ICD-10-CM | POA: Insufficient documentation

## 2017-08-18 DIAGNOSIS — R911 Solitary pulmonary nodule: Secondary | ICD-10-CM | POA: Insufficient documentation

## 2017-08-18 DIAGNOSIS — R0602 Shortness of breath: Secondary | ICD-10-CM | POA: Diagnosis not present

## 2017-08-18 DIAGNOSIS — D508 Other iron deficiency anemias: Secondary | ICD-10-CM

## 2017-08-18 DIAGNOSIS — I129 Hypertensive chronic kidney disease with stage 1 through stage 4 chronic kidney disease, or unspecified chronic kidney disease: Secondary | ICD-10-CM | POA: Diagnosis not present

## 2017-08-18 DIAGNOSIS — G4700 Insomnia, unspecified: Secondary | ICD-10-CM | POA: Diagnosis not present

## 2017-08-18 DIAGNOSIS — D509 Iron deficiency anemia, unspecified: Secondary | ICD-10-CM | POA: Insufficient documentation

## 2017-08-18 DIAGNOSIS — Z808 Family history of malignant neoplasm of other organs or systems: Secondary | ICD-10-CM | POA: Diagnosis not present

## 2017-08-18 DIAGNOSIS — D631 Anemia in chronic kidney disease: Secondary | ICD-10-CM

## 2017-08-18 DIAGNOSIS — I509 Heart failure, unspecified: Secondary | ICD-10-CM | POA: Insufficient documentation

## 2017-08-18 DIAGNOSIS — Z87891 Personal history of nicotine dependence: Secondary | ICD-10-CM | POA: Insufficient documentation

## 2017-08-18 DIAGNOSIS — N182 Chronic kidney disease, stage 2 (mild): Secondary | ICD-10-CM | POA: Insufficient documentation

## 2017-08-18 DIAGNOSIS — E785 Hyperlipidemia, unspecified: Secondary | ICD-10-CM | POA: Diagnosis not present

## 2017-08-18 DIAGNOSIS — Z79899 Other long term (current) drug therapy: Secondary | ICD-10-CM

## 2017-08-18 DIAGNOSIS — E039 Hypothyroidism, unspecified: Secondary | ICD-10-CM

## 2017-08-18 DIAGNOSIS — I2510 Atherosclerotic heart disease of native coronary artery without angina pectoris: Secondary | ICD-10-CM

## 2017-08-18 DIAGNOSIS — I10 Essential (primary) hypertension: Secondary | ICD-10-CM | POA: Insufficient documentation

## 2017-08-18 DIAGNOSIS — R5383 Other fatigue: Secondary | ICD-10-CM | POA: Insufficient documentation

## 2017-08-18 LAB — CBC WITH DIFFERENTIAL/PLATELET
Basophils Absolute: 0.1 10*3/uL (ref 0–0.1)
Basophils Relative: 1 %
EOS ABS: 0.2 10*3/uL (ref 0–0.7)
EOS PCT: 3 %
HCT: 30.3 % — ABNORMAL LOW (ref 35.0–47.0)
Hemoglobin: 9.8 g/dL — ABNORMAL LOW (ref 12.0–16.0)
LYMPHS ABS: 1.5 10*3/uL (ref 1.0–3.6)
LYMPHS PCT: 28 %
MCH: 29.2 pg (ref 26.0–34.0)
MCHC: 32.4 g/dL (ref 32.0–36.0)
MCV: 90.2 fL (ref 80.0–100.0)
MONO ABS: 0.4 10*3/uL (ref 0.2–0.9)
MONOS PCT: 7 %
Neutro Abs: 3.3 10*3/uL (ref 1.4–6.5)
Neutrophils Relative %: 61 %
PLATELETS: 369 10*3/uL (ref 150–440)
RBC: 3.36 MIL/uL — AB (ref 3.80–5.20)
RDW: 14.4 % (ref 11.5–14.5)
WBC: 5.4 10*3/uL (ref 3.6–11.0)

## 2017-08-18 LAB — IRON AND TIBC
IRON: 35 ug/dL (ref 28–170)
Saturation Ratios: 11 % (ref 10.4–31.8)
TIBC: 320 ug/dL (ref 250–450)
UIBC: 285 ug/dL

## 2017-08-18 LAB — FERRITIN: FERRITIN: 31 ng/mL (ref 11–307)

## 2017-08-18 MED ORDER — SODIUM CHLORIDE 0.9 % IV SOLN
510.0000 mg | Freq: Once | INTRAVENOUS | Status: AC
  Administered 2017-08-18: 510 mg via INTRAVENOUS
  Filled 2017-08-18: qty 17

## 2017-08-18 MED ORDER — SODIUM CHLORIDE 0.9 % IV SOLN
Freq: Once | INTRAVENOUS | Status: AC
  Administered 2017-08-18: 15:00:00 via INTRAVENOUS
  Filled 2017-08-18: qty 1000

## 2017-08-18 NOTE — Progress Notes (Signed)
Spoke with Melissa Knox regarding her follow up at [facility] regarding previous pancreatic mass. She confirmed she had her MRI in October and that scan was normal. She was called by [facility] and they informed her of these results and no further follow up was needed.      Oncology Nurse Navigator Documentation  Navigator Location: [facility] (08/18/17 1400)   )Navigator Encounter Type: Follow-up Appt (08/18/17 1400)                                                    Time Spent with Patient: 15 (08/18/17 1400)

## 2017-09-09 ENCOUNTER — Other Ambulatory Visit: Payer: Self-pay

## 2017-09-09 MED ORDER — CARVEDILOL 3.125 MG PO TABS: 3.1250 mg | ORAL_TABLET | Freq: Two times a day (BID) | ORAL | 3 refills | Status: DC

## 2017-09-09 MED ORDER — PANTOPRAZOLE SODIUM 40 MG PO TBEC: 40.0000 mg | DELAYED_RELEASE_TABLET | Freq: Two times a day (BID) | ORAL | 3 refills | Status: DC

## 2017-09-09 MED ORDER — FUROSEMIDE 20 MG PO TABS: 20.0000 mg | ORAL_TABLET | Freq: Every day | ORAL | 3 refills | Status: DC

## 2017-10-03 DIAGNOSIS — D631 Anemia in chronic kidney disease: Secondary | ICD-10-CM | POA: Diagnosis not present

## 2017-10-03 DIAGNOSIS — I10 Essential (primary) hypertension: Secondary | ICD-10-CM | POA: Diagnosis not present

## 2017-10-03 DIAGNOSIS — N183 Chronic kidney disease, stage 3 (moderate): Secondary | ICD-10-CM | POA: Diagnosis not present

## 2017-10-03 DIAGNOSIS — R809 Proteinuria, unspecified: Secondary | ICD-10-CM | POA: Diagnosis not present

## 2017-10-18 ENCOUNTER — Encounter: Payer: Self-pay | Admitting: Nurse Practitioner

## 2017-10-18 ENCOUNTER — Ambulatory Visit (INDEPENDENT_AMBULATORY_CARE_PROVIDER_SITE_OTHER): Payer: Medicare Other | Admitting: Nurse Practitioner

## 2017-10-18 VITALS — BP 134/60 | HR 93 | Temp 97.3°F | Resp 16 | Ht 60.0 in | Wt 137.4 lb

## 2017-10-18 DIAGNOSIS — J440 Chronic obstructive pulmonary disease with acute lower respiratory infection: Secondary | ICD-10-CM | POA: Diagnosis not present

## 2017-10-18 DIAGNOSIS — R0602 Shortness of breath: Secondary | ICD-10-CM

## 2017-10-18 DIAGNOSIS — I10 Essential (primary) hypertension: Secondary | ICD-10-CM | POA: Diagnosis not present

## 2017-10-18 DIAGNOSIS — J209 Acute bronchitis, unspecified: Secondary | ICD-10-CM | POA: Diagnosis not present

## 2017-10-18 MED ORDER — AMOXICILLIN-POT CLAVULANATE 875-125 MG PO TABS
1.0000 | ORAL_TABLET | Freq: Two times a day (BID) | ORAL | 0 refills | Status: DC
Start: 2017-10-18 — End: 2017-11-16

## 2017-10-18 MED ORDER — PREDNISONE 10 MG (21) PO TBPK: ORAL_TABLET | ORAL | 0 refills | Status: DC

## 2017-10-18 NOTE — Progress Notes (Signed)
[facility] [place], [place] [address]  Internal MEDICINE  Office Visit Note  Patient Name: Melissa Knox  [id number]  [id number]  Date of Service: 10/26/2017  Chief Complaint  Patient presents with  . Cough  . Laryngitis    Cough  This is a new problem. The current episode started 1 to 4 weeks ago. The problem has been unchanged. The problem occurs constantly. The cough is productive of sputum. Associated symptoms include chills, ear pain, headaches, myalgias, nasal congestion, postnasal drip, rhinorrhea and a sore throat. Pertinent negatives include no chest pain, fever or wheezing. Nothing aggravates the symptoms. She has tried a beta-agonist inhaler and steroid inhaler for the symptoms. The treatment provided mild relief. Her past medical history is significant for asthma, emphysema and environmental allergies.    Pt is here for routine follow up.    Current Medication: Outpatient Encounter Medications as of 10/18/2017  Medication Sig  . albuterol (PROVENTIL) (2.5 MG/3ML) 0.083% nebulizer solution Take 2.5 mg by nebulization every 6 (six) hours as needed for wheezing or shortness of breath.  Marland Kitchen amLODipine (NORVASC) 10 MG tablet Take 10 mg by mouth daily.  Marland Kitchen atorvastatin (LIPITOR) 10 MG tablet Take 10 mg by mouth daily at 6 PM.   . carvedilol (COREG) 6.25 MG tablet Take 6.25 mg by mouth 2 (two) times daily.  Marland Kitchen amoxicillin-clavulanate (AUGMENTIN) 875-125 MG tablet Take 1 tablet by mouth 2 (two) times daily.  . budesonide-formoterol (SYMBICORT) 160-4.5 MCG/ACT inhaler Inhale 2 puffs into the lungs 2 (two) times daily.  . feeding supplement, ENSURE ENLIVE, (ENSURE ENLIVE) LIQD Take 237 mLs by mouth 3 (three) times daily between meals.  . furosemide (LASIX) 20 MG tablet Take 1 tablet (20 mg total) by mouth daily.  . hydrALAZINE (APRESOLINE) 50 MG tablet Take 50 mg by mouth 2 (two) times daily.   Marland Kitchen HYDROcodone-acetaminophen (NORCO/VICODIN) 5-325 MG tablet TAKE  1 TABLET BY MOUTH 3 TO 4 TIMES DAILY AS NEEDED FOR PAIN  . levothyroxine (SYNTHROID, LEVOTHROID) 50 MCG tablet Take 50 mcg by mouth daily before breakfast.  . losartan (COZAAR) 100 MG tablet Take 100 mg by mouth daily.  . meclizine (ANTIVERT) 12.5 MG tablet Take 12.5 mg by mouth daily.   . mirtazapine (REMERON) 15 MG tablet Take 7.5 mg by mouth at bedtime.   . pantoprazole (PROTONIX) 40 MG tablet Take 1 tablet (40 mg total) by mouth 2 (two) times daily.  . predniSONE (STERAPRED UNI-PAK 21 TAB) 10 MG (21) TBPK tablet 6 day taper - take by mouth as directed for 6 days  . sucralfate (CARAFATE) 1 g tablet Take 1 tablet (1 g total) by mouth 4 (four) times daily -  with meals and at bedtime.  . [DISCONTINUED] carvedilol (COREG) 3.125 MG tablet Take 1 tablet (3.125 mg total) by mouth 2 (two) times daily with a meal.   No facility-administered encounter medications on file as of 10/18/2017.     Surgical History: Past Surgical History:  Procedure Laterality Date  . GALLBLADDER SURGERY  2012    Medical History: Past Medical History:  Diagnosis Date  . Atherosclerotic heart disease 06/12/2015  . CHF (congestive heart failure) ([place])   . CKD (chronic kidney disease) stage 2, GFR 60-89 ml/min 06/12/2015  . Hypercalcemia 06/12/2015  . Hyperlipidemia 06/12/2015  . Hypertension 06/12/2015  . Hypothyroidism 06/12/2015  . Insomnia 06/12/2015  . 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 Shortness of breath 06/12/2015  . Solitary pulmonary nodule 06/12/2015  Family History: Family History  Problem Relation Age of Onset  . Cancer Mother   . Hypertension Father   . Diabetes Father   . Cancer Father     Social History   Socioeconomic History  . Marital status: Married    Spouse name: Daryl  . Number of children: 3  . Years of education: 29  . Highest education level: 12th grade  Social Needs  . Financial resource strain: Not hard at all  . Food insecurity - worry: Never true  . Food  insecurity - inability: Never true  . Transportation needs - medical: No  . Transportation needs - non-medical: No  Occupational History  . Not on file  Tobacco Use  . Smoking status: Former Smoker    Last attempt to quit: 2013    Years since quitting: 6.1  . Smokeless tobacco: Never Used  Substance and Sexual Activity  . Alcohol use: No    Alcohol/week: 0.0 oz  . Drug use: No  . Sexual activity: Yes    Birth control/protection: None  Other Topics Concern  . Not on file  Social History Narrative  . Not on file      Review of Systems  Constitutional: Positive for chills and fatigue. Negative for activity change and fever.  HENT: Positive for congestion, ear pain, postnasal drip, rhinorrhea, sinus pain, sore throat and voice change.   Eyes: Negative.   Respiratory: Positive for cough. Negative for wheezing.   Cardiovascular: Negative for chest pain and palpitations.  Gastrointestinal: Negative for constipation, diarrhea, nausea and vomiting.  Endocrine: Negative for cold intolerance, heat intolerance, polydipsia, polyphagia and polyuria.  Genitourinary: Negative.   Musculoskeletal: Positive for myalgias.  Skin: Negative.   Allergic/Immunologic: Positive for environmental allergies.  Neurological: Positive for headaches.  Hematological: Positive for adenopathy.  Psychiatric/Behavioral: Negative.     Today's Vitals   10/18/17 1133  BP: 134/60  Pulse: 93  Resp: 16  Temp: (!) 97.3 F (36.3 C)  SpO2: 96%  Weight: 137 lb 6.4 oz (62.3 kg)  Height: 5' (1.524 m)    Physical Exam  Constitutional: She is oriented to person, place, and time. She appears well-developed and well-nourished. She appears ill. No distress.  HENT:  Head: Normocephalic and atraumatic.  Right Ear: Tympanic membrane is bulging.  Left Ear: Tympanic membrane is bulging.  Nose: Rhinorrhea present. Right sinus exhibits maxillary sinus tenderness and frontal sinus tenderness. Left sinus exhibits  maxillary sinus tenderness and frontal sinus tenderness.  Mouth/Throat: Oropharynx is clear and moist. No oropharyngeal exudate.  Eyes: EOM are normal. Pupils are equal, round, and reactive to light.  Neck: Normal range of motion. Neck supple. No JVD present. No tracheal deviation present. No thyromegaly present.  Cardiovascular: Normal rate, regular rhythm and normal heart sounds. Exam reveals no gallop and no friction rub.  No murmur heard. Pulmonary/Chest: Effort normal. No respiratory distress. She has wheezes. She has rales. She exhibits no tenderness.  Congested, non-productive cough present   Abdominal: Soft. Bowel sounds are normal. There is no tenderness.  Musculoskeletal: Normal range of motion.  Lymphadenopathy:    She has no cervical adenopathy.  Neurological: She is alert and oriented to person, place, and time. No cranial nerve deficit.  Skin: Skin is warm and dry. She is not diaphoretic.  Psychiatric: She has a normal mood and affect. Her behavior is normal. Judgment and thought content normal.  Nursing note and vitals reviewed.   Assessment/Plan: 1. Acute bronchitis with COPD ([place]) - amoxicillin-clavulanate (  AUGMENTIN) 875-125 MG tablet; Take 1 tablet by mouth 2 (two) times daily.  Dispense: 20 tablet; Refill: 0 - predniSONE (STERAPRED UNI-PAK 21 TAB) 10 MG (21) TBPK tablet; 6 day taper - take by mouth as directed for 6 days  Dispense: 21 tablet; Refill: 0  2. Shortness of breath Prednisone 6 day taper. Take as directed. Use oxygen as prescribed.   3. Essential hypertension Stable. Continue bp medication as prescribed.   General Counseling: Adalynd verbalizes understanding of the findings of todays visit and agrees with plan of treatment. I have discussed any further diagnostic evaluation that may be needed or ordered today. We also reviewed her medications today. she has been encouraged to call the office with any questions or concerns that should arise related to todays  visit.   This patient was seen by Leretha Pol, FNP- C in Collaboration with Dr Lavera Guise as a part of collaborative care agreement    Meds ordered this encounter  Medications  . amoxicillin-clavulanate (AUGMENTIN) 875-125 MG tablet    Sig: Take 1 tablet by mouth 2 (two) times daily.    Dispense:  20 tablet    Refill:  0    Order Specific Question:   Supervising Provider    Answer:   Lavera Guise [[id number]]  . predniSONE (STERAPRED UNI-PAK 21 TAB) 10 MG (21) TBPK tablet    Sig: 6 day taper - take by mouth as directed for 6 days    Dispense:  21 tablet    Refill:  0    Order Specific Question:   Supervising Provider    Answer:   Lavera Guise [[id number]]    Time spent: 55 Minutes    Dr Lavera Guise Internal medicine

## 2017-11-08 ENCOUNTER — Other Ambulatory Visit: Payer: Self-pay

## 2017-11-08 MED ORDER — PANTOPRAZOLE SODIUM 40 MG PO TBEC: 40.0000 mg | DELAYED_RELEASE_TABLET | Freq: Two times a day (BID) | ORAL | 3 refills | Status: DC

## 2017-11-13 NOTE — Progress Notes (Signed)
[place]  Telephone:([phone]) [phone] Fax:(336) [phone]  ID: [name] OB: 04-Mar-1951  MR#: [id number]  GUR#:[id number]  Patient Care Team: Ronnell Freshwater, NP as PCP - General (Family Medicine) Clent Jacks, RN as Registered Nurse  CHIEF COMPLAINT: Anemia in chronic renal disease, iron deficiency anemia.  INTERVAL HISTORY: Patient returns to clinic today for repeat laboratory work and further evaluation. She continues to have chronic weakness and fatigue, but otherwise feels well. She has no neurologic complaints. She denies any recent fevers or illnesses. She denies any chest pain or shortness of breath. She denies any nausea, vomiting, constipation, or diarrhea. She has no melena or hematochezia. She has no urinary complaints. Patient offers no further specific complaints today.  REVIEW OF SYSTEMS:   Review of Systems  Constitutional: Positive for malaise/fatigue. Negative for fever and weight loss.  Respiratory: Negative.  Negative for cough, hemoptysis and shortness of breath.   Cardiovascular: Negative.  Negative for chest pain and leg swelling.  Gastrointestinal: Negative.  Negative for abdominal pain, blood in stool and melena.  Genitourinary: Negative.   Musculoskeletal: Negative.  Negative for back pain.  Skin: Negative.  Negative for rash.  Neurological: Positive for weakness.  Endo/Heme/Allergies: Does not bruise/bleed easily.  Psychiatric/Behavioral: Negative.  The patient is not nervous/anxious.     As per HPI. Otherwise, a complete review of systems is negative.  PAST MEDICAL HISTORY: Past Medical History:  Diagnosis Date  . Atherosclerotic heart disease 06/12/2015  . CHF (congestive heart failure) ([place])   . CKD (chronic kidney disease) stage 2, GFR 60-89 ml/min 06/12/2015  . Hypercalcemia 06/12/2015  . Hyperlipidemia 06/12/2015  . Hypertension 06/12/2015  . Hypothyroidism 06/12/2015  . Insomnia 06/12/2015  . Pulmonary heart  disease ([place]) 06/12/2015  . Shortness of breath 06/12/2015  . Solitary pulmonary nodule 06/12/2015    PAST SURGICAL HISTORY: Past Surgical History:  Procedure Laterality Date  . GALLBLADDER SURGERY  2012    FAMILY HISTORY: Family History  Problem Relation Age of Onset  . Cancer Mother   . Hypertension Father   . Diabetes Father   . Cancer Father        ADVANCED DIRECTIVES (Y/N):  N   HEALTH MAINTENANCE: Social History   Tobacco Use  . Smoking status: Former Smoker    Last attempt to quit: 2013    Years since quitting: 6.2  . Smokeless tobacco: Never Used  Substance Use Topics  . Alcohol use: No    Alcohol/week: 0.0 oz  . Drug use: No     Colonoscopy:  PAP:  Bone density:  Lipid panel:  No Known Allergies  Current Outpatient Medications  Medication Sig Dispense Refill  . albuterol (PROVENTIL) (2.5 MG/3ML) 0.083% nebulizer solution Take 2.5 mg by nebulization every 6 (six) hours as needed for wheezing or shortness of breath.    Marland Kitchen amLODipine (NORVASC) 10 MG tablet Take 10 mg by mouth daily.  12  . atorvastatin (LIPITOR) 10 MG tablet Take 10 mg by mouth daily at 6 PM.     . budesonide-formoterol (SYMBICORT) 160-4.5 MCG/ACT inhaler Inhale 2 puffs into the lungs 2 (two) times daily.    . carvedilol (COREG) 6.25 MG tablet Take 6.25 mg by mouth 2 (two) times daily.  6  . feeding supplement, ENSURE ENLIVE, (ENSURE ENLIVE) LIQD Take 237 mLs by mouth 3 (three) times daily between meals. 237 mL 12  . furosemide (LASIX) 20 MG tablet Take 1 tablet (20 mg total) by mouth daily. 30 tablet 3  .  hydrALAZINE (APRESOLINE) 50 MG tablet Take 50 mg by mouth 2 (two) times daily.   6  . levothyroxine (SYNTHROID, LEVOTHROID) 50 MCG tablet Take 50 mcg by mouth daily before breakfast.    . losartan (COZAAR) 100 MG tablet Take 100 mg by mouth daily.  3  . meclizine (ANTIVERT) 12.5 MG tablet Take 12.5 mg by mouth daily.     . mirtazapine (REMERON) 15 MG tablet Take 7.5 mg by mouth at  bedtime.     . pantoprazole (PROTONIX) 40 MG tablet Take 1 tablet (40 mg total) by mouth 2 (two) times daily. 30 tablet 3  . sucralfate (CARAFATE) 1 g tablet Take 1 tablet (1 g total) by mouth 4 (four) times daily -  with meals and at bedtime. 90 tablet 0  . HYDROcodone-acetaminophen (NORCO/VICODIN) 5-325 MG tablet TAKE 1 TABLET BY MOUTH 3 TO 4 TIMES DAILY AS NEEDED FOR PAIN  0   No current facility-administered medications for this visit.     OBJECTIVE: Vitals:   11/16/17 1415  BP: (!) 157/67  Pulse: 98  Resp: 18  Temp: (!) 97.5 F (36.4 C)     Body mass index is 26.7 kg/m.    ECOG FS:0 - Asymptomatic  General: Well-developed, well-nourished, no acute distress. Eyes: Pink conjunctiva, anicteric sclera. Lungs: Clear to auscultation bilaterally. Heart: Regular rate and rhythm. No rubs, murmurs, or gallops. Abdomen: Soft, nontender, nondistended. No organomegaly noted, normoactive bowel sounds. Musculoskeletal: No edema, cyanosis, or clubbing. Neuro: Alert, answering all questions appropriately. Cranial nerves grossly intact. Skin: No rashes or petechiae noted. Psych: Normal affect.   LAB RESULTS:  Lab Results  Component Value Date   NA 138 05/30/2017   K 3.8 05/30/2017   CL 103 05/30/2017   CO2 27 05/30/2017   GLUCOSE 143 (H) 05/30/2017   BUN 35 (H) 05/30/2017   CREATININE 1.82 (H) 05/30/2017   CALCIUM 9.6 05/30/2017   PROT 6.9 02/07/2017   ALBUMIN 2.9 (L) 02/07/2017   AST 18 02/07/2017   ALT 11 (L) 02/07/2017   ALKPHOS 50 02/07/2017   BILITOT 0.6 02/07/2017   GFRNONAA 28 (L) 05/30/2017   GFRAA 32 (L) 05/30/2017    Lab Results  Component Value Date   WBC 6.7 11/16/2017   NEUTROABS 4.6 11/16/2017   HGB 10.4 (L) 11/16/2017   HCT 31.4 (L) 11/16/2017   MCV 90.6 11/16/2017   PLT 492 (H) 11/16/2017   Lab Results  Component Value Date   IRON 35 08/18/2017   TIBC 320 08/18/2017   IRONPCTSAT 11 08/18/2017   Lab Results  Component Value Date   FERRITIN 31  08/18/2017     STUDIES: No results found.  ASSESSMENT: Anemia in chronic renal disease, Iron deficiency anemia.  PLAN:    1. Iron deficiency anemia: Patient's hemoglobin has improved to greater than 10.0 and her iron stores are now within normal limits.  She does not require additional IV Feraheme today.  Her last infusion occurred on August 18, 2017.  Previously, the remainder of her laboratory work was either negative or within normal limits. Return to clinic in 3 months with repeat laboratory work, further evaluation, and consideration of IV iron.  2. Anemia in chronic renal disease: No intervention is needed at this time.  Patient may require Procrit in the future her hemoglobin trends below 10.0 and her iron stores are adequately replaced.  3. Chronic renal insufficiency: Continued evaluation and treatment per nephrology. 4. Hypertension: Patient's blood pressure is mildly elevated today.  Continue  monitoring and treatment per primary care.  Patient expressed understanding and was in agreement with this plan. She also understands that She can call clinic at any time with any questions, concerns, or complaints.    Lloyd Huger, MD 11/16/17 2:21 PM

## 2017-11-15 ENCOUNTER — Other Ambulatory Visit: Payer: Self-pay | Admitting: *Deleted

## 2017-11-15 DIAGNOSIS — D649 Anemia, unspecified: Secondary | ICD-10-CM

## 2017-11-15 NOTE — Progress Notes (Signed)
cbc

## 2017-11-16 ENCOUNTER — Inpatient Hospital Stay: Payer: Medicare Other | Attending: Oncology

## 2017-11-16 ENCOUNTER — Inpatient Hospital Stay (HOSPITAL_BASED_OUTPATIENT_CLINIC_OR_DEPARTMENT_OTHER): Payer: Medicare Other | Admitting: Oncology

## 2017-11-16 ENCOUNTER — Other Ambulatory Visit: Payer: Self-pay

## 2017-11-16 ENCOUNTER — Inpatient Hospital Stay: Payer: Medicare Other

## 2017-11-16 VITALS — BP 157/67 | HR 98 | Temp 97.5°F | Resp 18 | Wt 136.7 lb

## 2017-11-16 DIAGNOSIS — Z87891 Personal history of nicotine dependence: Secondary | ICD-10-CM | POA: Insufficient documentation

## 2017-11-16 DIAGNOSIS — D509 Iron deficiency anemia, unspecified: Secondary | ICD-10-CM | POA: Insufficient documentation

## 2017-11-16 DIAGNOSIS — Z79899 Other long term (current) drug therapy: Secondary | ICD-10-CM | POA: Diagnosis not present

## 2017-11-16 DIAGNOSIS — D631 Anemia in chronic kidney disease: Secondary | ICD-10-CM | POA: Insufficient documentation

## 2017-11-16 DIAGNOSIS — I130 Hypertensive heart and chronic kidney disease with heart failure and stage 1 through stage 4 chronic kidney disease, or unspecified chronic kidney disease: Secondary | ICD-10-CM | POA: Insufficient documentation

## 2017-11-16 DIAGNOSIS — R531 Weakness: Secondary | ICD-10-CM | POA: Diagnosis not present

## 2017-11-16 DIAGNOSIS — N189 Chronic kidney disease, unspecified: Secondary | ICD-10-CM

## 2017-11-16 DIAGNOSIS — D508 Other iron deficiency anemias: Secondary | ICD-10-CM

## 2017-11-16 DIAGNOSIS — N182 Chronic kidney disease, stage 2 (mild): Secondary | ICD-10-CM

## 2017-11-16 DIAGNOSIS — D649 Anemia, unspecified: Secondary | ICD-10-CM

## 2017-11-16 LAB — CBC WITH DIFFERENTIAL/PLATELET
Basophils Absolute: 0.1 10*3/uL (ref 0–0.1)
Basophils Relative: 1 %
EOS ABS: 0.3 10*3/uL (ref 0–0.7)
Eosinophils Relative: 5 %
HCT: 31.4 % — ABNORMAL LOW (ref 35.0–47.0)
Hemoglobin: 10.4 g/dL — ABNORMAL LOW (ref 12.0–16.0)
LYMPHS ABS: 1.3 10*3/uL (ref 1.0–3.6)
LYMPHS PCT: 19 %
MCH: 29.9 pg (ref 26.0–34.0)
MCHC: 33 g/dL (ref 32.0–36.0)
MCV: 90.6 fL (ref 80.0–100.0)
MONO ABS: 0.4 10*3/uL (ref 0.2–0.9)
MONOS PCT: 6 %
Neutro Abs: 4.6 10*3/uL (ref 1.4–6.5)
Neutrophils Relative %: 69 %
Platelets: 492 10*3/uL — ABNORMAL HIGH (ref 150–440)
RBC: 3.46 MIL/uL — AB (ref 3.80–5.20)
RDW: 14.7 % — ABNORMAL HIGH (ref 11.5–14.5)
WBC: 6.7 10*3/uL (ref 3.6–11.0)

## 2017-11-16 LAB — IRON AND TIBC
Iron: 43 ug/dL (ref 28–170)
Saturation Ratios: 16 % (ref 10.4–31.8)
TIBC: 277 ug/dL (ref 250–450)
UIBC: 234 ug/dL

## 2017-11-16 LAB — FERRITIN: FERRITIN: 89 ng/mL (ref 11–307)

## 2017-11-16 NOTE — Progress Notes (Signed)
Here for follow up -stated doing "fine " overall

## 2017-11-21 ENCOUNTER — Encounter: Payer: Self-pay | Admitting: Internal Medicine

## 2017-11-21 ENCOUNTER — Ambulatory Visit (INDEPENDENT_AMBULATORY_CARE_PROVIDER_SITE_OTHER): Payer: Medicare Other | Admitting: Internal Medicine

## 2017-11-21 VITALS — BP 162/74 | HR 94 | Ht 60.0 in | Wt 138.0 lb

## 2017-11-21 DIAGNOSIS — I272 Pulmonary hypertension, unspecified: Secondary | ICD-10-CM | POA: Diagnosis not present

## 2017-11-21 DIAGNOSIS — J449 Chronic obstructive pulmonary disease, unspecified: Secondary | ICD-10-CM | POA: Diagnosis not present

## 2017-11-21 DIAGNOSIS — I509 Heart failure, unspecified: Secondary | ICD-10-CM

## 2017-11-21 DIAGNOSIS — R911 Solitary pulmonary nodule: Secondary | ICD-10-CM

## 2017-11-21 NOTE — Patient Instructions (Signed)

## 2017-11-21 NOTE — Progress Notes (Signed)
[facility] [place], [place] [address]  Pulmonary Sleep Medicine  Office Visit Note  Patient Name: Melissa Knox DOB: 1951/03/22 MRN [id number]  Date of Service: 11/21/2017  Complaints/HPI:   Patient is here for follow-up of COPD and pulmonary hypertension.  Doing fairly well at this time she was supposed to have an echocardiogram done and has not yet had it done.  Patient was scheduled for and apparently she had some confusion about when she was supposed to come in.  Right now she has no cough no congestion no fevers noted.  Denies having any chest pain at this time  ROS  General: (-) fever, (-) chills, (-) night sweats, (-) weakness Skin: (-) rashes, (-) itching,. Eyes: (-) visual changes, (-) redness, (-) itching. Nose and Sinuses: (-) nasal stuffiness or itchiness, (-) postnasal drip, (-) nosebleeds, (-) sinus trouble. Mouth and Throat: (-) sore throat, (-) hoarseness. Neck: (-) swollen glands, (-) enlarged thyroid, (-) neck pain. Respiratory: + cough, (-) bloody sputum, + shortness of breath, - wheezing. Cardiovascular: - ankle swelling, (-) chest pain. Lymphatic: (-) lymph node enlargement. Neurologic: (-) numbness, (-) tingling. Psychiatric: (-) anxiety, (-) depression   Current Medication: Outpatient Encounter Medications as of 11/21/2017  Medication Sig  . albuterol (PROVENTIL) (2.5 MG/3ML) 0.083% nebulizer solution Take 2.5 mg by nebulization every 6 (six) hours as needed for wheezing or shortness of breath.  Marland Kitchen amLODipine (NORVASC) 10 MG tablet Take 10 mg by mouth daily.  Marland Kitchen atorvastatin (LIPITOR) 10 MG tablet Take 10 mg by mouth daily at 6 PM.   . budesonide-formoterol (SYMBICORT) 160-4.5 MCG/ACT inhaler Inhale 2 puffs into the lungs 2 (two) times daily.  . carvedilol (COREG) 6.25 MG tablet Take 6.25 mg by mouth 2 (two) times daily.  . feeding supplement, ENSURE ENLIVE, (ENSURE ENLIVE) LIQD Take 237 mLs by mouth 3 (three) times daily between  meals.  . furosemide (LASIX) 20 MG tablet Take 1 tablet (20 mg total) by mouth daily.  . hydrALAZINE (APRESOLINE) 50 MG tablet Take 50 mg by mouth 2 (two) times daily.   Marland Kitchen HYDROcodone-acetaminophen (NORCO/VICODIN) 5-325 MG tablet TAKE 1 TABLET BY MOUTH 3 TO 4 TIMES DAILY AS NEEDED FOR PAIN  . levothyroxine (SYNTHROID, LEVOTHROID) 50 MCG tablet Take 50 mcg by mouth daily before breakfast.  . losartan (COZAAR) 100 MG tablet Take 100 mg by mouth daily.  . meclizine (ANTIVERT) 12.5 MG tablet Take 12.5 mg by mouth daily.   . mirtazapine (REMERON) 15 MG tablet Take 7.5 mg by mouth at bedtime.   . pantoprazole (PROTONIX) 40 MG tablet Take 1 tablet (40 mg total) by mouth 2 (two) times daily.  . sucralfate (CARAFATE) 1 g tablet Take 1 tablet (1 g total) by mouth 4 (four) times daily -  with meals and at bedtime.   No facility-administered encounter medications on file as of 11/21/2017.     Surgical History: Past Surgical History:  Procedure Laterality Date  . GALLBLADDER SURGERY  2012    Medical History: Past Medical History:  Diagnosis Date  . Atherosclerotic heart disease 06/12/2015  . CHF (congestive heart failure) ([place])   . CKD (chronic kidney disease) stage 2, GFR 60-89 ml/min 06/12/2015  . Hypercalcemia 06/12/2015  . Hyperlipidemia 06/12/2015  . Hypertension 06/12/2015  . Hypothyroidism 06/12/2015  . Insomnia 06/12/2015  . Pulmonary heart disease ([place]) 06/12/2015  . Shortness of breath 06/12/2015  . Solitary pulmonary nodule 06/12/2015    Family History: Family History  Problem Relation Age of  Onset  . Cancer Mother   . Hypertension Father   . Diabetes Father   . Cancer Father     Social History: Social History   Socioeconomic History  . Marital status: Married    Spouse name: Daryl  . Number of children: 3  . Years of education: 87  . Highest education level: 12th grade  Occupational History  . Not on file  Social Needs  . Financial resource strain: Not hard at  all  . Food insecurity:    Worry: Never true    Inability: Never true  . Transportation needs:    Medical: No    Non-medical: No  Tobacco Use  . Smoking status: Former Smoker    Last attempt to quit: 2013    Years since quitting: 6.2  . Smokeless tobacco: Never Used  Substance and Sexual Activity  . Alcohol use: No    Alcohol/week: 0.0 oz  . Drug use: No  . Sexual activity: Yes    Birth control/protection: None  Lifestyle  . Physical activity:    Days per week: 2 days    Minutes per session: 30 min  . Stress: Not at all  Relationships  . Social connections:    Talks on phone: Twice a week    Gets together: Once a week    Attends religious service: 1 to 4 times per year    Active member of club or organization: No    Attends meetings of clubs or organizations: Never    Relationship status: Married  . Intimate partner violence:    Fear of current or ex partner: No    Emotionally abused: No    Physically abused: No    Forced sexual activity: No  Other Topics Concern  . Not on file  Social History Narrative  . Not on file    Vital Signs: Blood pressure (!) 162/74, pulse 94, height 5' (1.524 m), weight 138 lb (62.6 kg), SpO2 93 %.  Examination: General Appearance: The patient is well-developed, well-nourished, and in no distress. Skin: Gross inspection of skin unremarkable. Head: normocephalic, no gross deformities. Eyes: no gross deformities noted. ENT: ears appear grossly normal no exudates. Neck: Supple. No thyromegaly. No LAD. Respiratory: scattered scant rhonchi noted. Cardiovascular: Normal S1 and S2 without murmur or rub. Extremities: No cyanosis. pulses are equal. Neurologic: Alert and oriented. No involuntary movements.  LABS: Recent Results (from the past 2160 hour(s))  Ferritin     Status: None   Collection Time: 11/16/17  1:35 PM  Result Value Ref Range   Ferritin 89 11 - 307 ng/mL    Comment: Performed at [facility], [address] [place], [place] [address]  Iron and TIBC     Status: None   Collection Time: 11/16/17  1:35 PM  Result Value Ref Range   Iron 43 28 - 170 ug/dL   TIBC 277 250 - 450 ug/dL   Saturation Ratios 16 10.4 - 31.8 %   UIBC 234 ug/dL    Comment: Performed at [facility], [address] [place], [place] [address]  CBC with Differential/Platelet     Status: Abnormal   Collection Time: 11/16/17  1:35 PM  Result Value Ref Range   WBC 6.7 3.6 - 11.0 K/uL   RBC 3.46 (L) 3.80 - 5.20 MIL/uL   Hemoglobin 10.4 (L) 12.0 - 16.0 g/dL   HCT 31.4 (L) 35.0 - 47.0 %   MCV 90.6 80.0 - 100.0 fL  MCH 29.9 26.0 - 34.0 pg   MCHC 33.0 32.0 - 36.0 g/dL   RDW 14.7 (H) 11.5 - 14.5 %   Platelets 492 (H) 150 - 440 K/uL   Neutrophils Relative % 69 %   Neutro Abs 4.6 1.4 - 6.5 K/uL   Lymphocytes Relative 19 %   Lymphs Abs 1.3 1.0 - 3.6 K/uL   Monocytes Relative 6 %   Monocytes Absolute 0.4 0.2 - 0.9 K/uL   Eosinophils Relative 5 %   Eosinophils Absolute 0.3 0 - 0.7 K/uL   Basophils Relative 1 %   Basophils Absolute 0.1 0 - 0.1 K/uL    Comment: Performed at [facility], [address], [place], [place] [address]    Radiology: Ct Head Wo Contrast  Result Date: 05/28/2017 CLINICAL DATA:  Weakness x1 week EXAM: CT HEAD WITHOUT CONTRAST TECHNIQUE: Contiguous axial images were obtained from the base of the skull through the vertex without intravenous contrast. COMPARISON:  None. FINDINGS: Brain: Chronic appearing small vessel ischemic disease of periventricular white matter and the centrum semiovale. Remote appearing right caudate head lacunar infarct. No hydrocephalus. Midline basal cisterns and fourth ventricle without effacement. No large vascular territory infarct. No acute intracranial mass, hemorrhage or midline shift. No extra-axial fluid collections. Vascular: Moderate atherosclerosis of the carotid siphons. No hyperdense vessels. Skull: Negative for fracture or focal lesion. Sinuses/Orbits: No  acute finding. Other: None. IMPRESSION: 1. Chronic appearing small vessel ischemic disease. Chronic right caudate head lacunar infarct. 2. No acute intracranial abnormality nor suspicious osseous findings. Electronically Signed   By: [name] Royalty M.D.   On: 05/28/2017 18:17   Ct Angio Chest Pe W And/or Wo Contrast  Result Date: 05/28/2017 CLINICAL DATA:  Hypoxia.  Weakness.  Pulmonary embolism suspected. EXAM: CT ANGIOGRAPHY CHEST WITH CONTRAST TECHNIQUE: Multidetector CT imaging of the chest was performed using the standard protocol during bolus administration of intravenous contrast. Multiplanar CT image reconstructions and MIPs were obtained to evaluate the vascular anatomy. CONTRAST:  60 cc Isovue 370 IV. COMPARISON:  Chest radiograph from earlier today. 08/13/2014 chest CT. FINDINGS: Cardiovascular: The study is high quality for the evaluation of pulmonary embolism. There are no filling defects in the central, lobar, segmental or subsegmental pulmonary artery branches to suggest acute pulmonary embolism. Atherosclerotic nonaneurysmal thoracic aorta. Main pulmonary artery diameter 3.0 cm, top-normal. Borderline mild cardiomegaly. No significant pericardial fluid/thickening. Left anterior descending and right coronary atherosclerosis. Mediastinum/Nodes: No discrete thyroid nodules. Unremarkable esophagus. No axillary adenopathy. Newly mildly enlarged 1.3 cm right paratracheal node (series 4/ image 36). Newly mildly enlarged 1.0 cm subcarinal node (series 4/ image 46). Newly mildly enlarged 1.1 cm right hilar node (series 4/ image 44). Newly mildly enlarged 1.0 cm left hilar node (series 4/ image 47). Lungs/Pleura: No pneumothorax. No pleural effusion. No acute consolidative airspace disease. Severe centrilobular and paraseptal emphysema throughout both lungs with bullous emphysema at the lung apices and lung bases bilaterally. New ground-glass attenuation and prominent interlobular septal thickening throughout  both lungs. Vague subsolid opacity in the superior segment right lower lobe (series 6/ image 36), cannot exclude underlying pulmonary nodule in this location. Upper abdomen: Stable scarring in the upper right kidney. No acute abnormality in the visualized upper abdomen. Musculoskeletal: No aggressive appearing focal osseous lesions. Mild thoracic spondylosis. Review of the MIP images confirms the above findings. IMPRESSION: 1. No pulmonary embolism . 2. Severe emphysema, including bullous emphysema at the lung apices and lung bases. 3. Spectrum of findings suggestive of congestive heart  failure. Borderline mild cardiomegaly. New ground-glass attenuation and interlobular septal thickening throughout both lungs, suggesting pulmonary edema. 4. Vague sub solid opacity in the superior segment right lower lobe, cannot exclude underlying pulmonary nodule in this location. Recommend follow-up chest CT in 3 months. 5. New mild mediastinal and bilateral hilar adenopathy, nonspecific, probably reactive/congestive. These nodes can also be reassessed on follow-up chest CT in 3 months. 6. Two vessel coronary atherosclerosis. Aortic Atherosclerosis (ICD10-I70.0) and Emphysema (ICD10-J43.9). Electronically Signed   By: Ilona Sorrel M.D.   On: 05/28/2017 20:06   Dg Chest Portable 1 View  Result Date: 05/28/2017 CLINICAL DATA:  Hypoxia. EXAM: PORTABLE CHEST 1 VIEW COMPARISON:  10/03/2012 FINDINGS: The heart size appears normal. Aortic atherosclerosis noted. There is no pleural effusion identified. Advanced changes of centrilobular and paraseptal emphysema again identified. Airspace opacity within the right mid lung is identified which may reflect superimposed pneumonia. IMPRESSION: 1. Right midlung opacity which may represent pneumonia. Followup PA and lateral chest X-ray is recommended in 3-4 weeks following trial of antibiotic therapy to ensure resolution and exclude underlying malignancy. 2. Aortic Atherosclerosis  (ICD10-I70.0) and Emphysema (ICD10-J43.9). Electronically Signed   By: Kerby Moors M.D.   On: 05/28/2017 17:51    No results found.  No results found.    Assessment and Plan: Patient Active Problem List   Diagnosis Date Noted  . CHF (congestive heart failure) ([place]) 05/28/2017  . Syncope 02/06/2017  . GI bleed 02/06/2017  . Pancreatic mass 02/06/2017  . Iron deficiency anemia 05/01/2016  . Anemia in chronic renal disease 04/26/2016  . Insomnia 06/12/2015  . CKD (chronic kidney disease) stage 2, GFR 60-89 ml/min 06/12/2015  . Hypercalcemia 06/12/2015  . Hypothyroidism 06/12/2015  . Pulmonary heart disease ([place]) 06/12/2015  . Hyperlipidemia 06/12/2015  . Hypertension 06/12/2015  . Atherosclerotic heart disease 06/12/2015  . Solitary pulmonary nodule 06/12/2015  . COPD (chronic obstructive pulmonary disease) ([place]) 06/12/2015    1. COPD stable at this time. She did receive some steroids and abx in February also is on symbocrt 2. Diastolic CHF as noted will get follow up Echo 3. Pulmonary HTN will get follow up Echo ordered 4. Pulmonary nodule follow up CT scan to reassess the adenopathy and nodues  General Counseling: I have discussed the findings of the evaluation and examination with Melissa Knox.  I have also discussed any further diagnostic evaluation thatmay be needed or ordered today. Melissa Knox verbalizes understanding of the findings of todays visit. We also reviewed her medications today and discussed drug interactions and side effects including but not limited excessive drowsiness and altered mental states. We also discussed that there is always a risk not just to her but also people around her. she has been encouraged to call the office with any questions or concerns that should arise related to todays visit.    Time spent: 55min  I have personally obtained a history, examined the patient, evaluated laboratory and imaging results, formulated the assessment and plan and  placed orders.    Allyne Gee, MD [facility] Pulmonary and Critical Care Sleep medicine

## 2017-12-23 LAB — HIV ANTIBODY (ROUTINE TESTING W REFLEX): HIV Screen 4th Generation wRfx: NONREACTIVE

## 2017-12-26 ENCOUNTER — Other Ambulatory Visit: Payer: Self-pay

## 2017-12-26 MED ORDER — PANTOPRAZOLE SODIUM 40 MG PO TBEC: 40.0000 mg | DELAYED_RELEASE_TABLET | Freq: Two times a day (BID) | ORAL | 3 refills | Status: DC

## 2017-12-26 MED ORDER — FUROSEMIDE 20 MG PO TABS: 20.0000 mg | ORAL_TABLET | Freq: Every day | ORAL | 3 refills | Status: DC

## 2017-12-30 ENCOUNTER — Ambulatory Visit: Payer: Medicare Other

## 2017-12-30 DIAGNOSIS — I509 Heart failure, unspecified: Secondary | ICD-10-CM | POA: Diagnosis not present

## 2018-01-12 ENCOUNTER — Ambulatory Visit: Payer: Medicare Other | Admitting: Family

## 2018-01-12 ENCOUNTER — Other Ambulatory Visit: Payer: Self-pay | Admitting: Nurse Practitioner

## 2018-01-12 MED ORDER — ATORVASTATIN CALCIUM 10 MG PO TABS: 10.0000 mg | ORAL_TABLET | Freq: Every day | ORAL | 3 refills | Status: DC

## 2018-01-15 NOTE — Progress Notes (Signed)
Patient ID: Melissa Knox, female    DOB: October 28, 1950, 67 y.o.   MRN: [id number]  HPI  Melissa Knox is a 67 y/o female with a history of CAD, CKD, hyperlipidemia, HTN, hypothyroidism, COPD, pulmonary nodule, previous tobacco use and chronic heart failure.  Echo done 01/04/18 but unable to view report. Echo report from 10/04/16 reviewed and showed an EF of 80% along with a PA pressure of 33 mm Hg.  Has not been admitted or been in the ED in the last 6 months.   She presents today for a follow-up visit with a chief complaint of minimal shortness of breath upon moderate exertion. She says this has been present for several years. She has associated fatigue, cough, rhinorrhea, post-nasal drip and difficulty sleeping due to cough. She denies any abdominal distention, palpitations, pedal edema, chest pain, wheezing, dizziness or weight gain. Feels like her cough is related to allergy symptoms. Took mucinex for one day without relief.   Past Medical History:  Diagnosis Date  . Atherosclerotic heart disease 06/12/2015  . CHF (congestive heart failure) ([place])   . CKD (chronic kidney disease) stage 2, GFR 60-89 ml/min 06/12/2015  . Hypercalcemia 06/12/2015  . Hyperlipidemia 06/12/2015  . Hypertension 06/12/2015  . Hypothyroidism 06/12/2015  . Insomnia 06/12/2015  . Pulmonary heart disease ([place]) 06/12/2015  . Shortness of breath 06/12/2015  . Solitary pulmonary nodule 06/12/2015   Past Surgical History:  Procedure Laterality Date  . GALLBLADDER SURGERY  2012   Family History  Problem Relation Age of Onset  . Cancer Mother   . Hypertension Father   . Diabetes Father   . Cancer Father    Social History   Tobacco Use  . Smoking status: Former Smoker    Last attempt to quit: 2013    Years since quitting: 6.3  . Smokeless tobacco: Never Used  Substance Use Topics  . Alcohol use: No    Alcohol/week: 0.0 oz   No Known Allergies  Prior to Admission medications   Medication Sig Start Date End  Date Taking? Authorizing Provider  albuterol (PROVENTIL) (2.5 MG/3ML) 0.083% nebulizer solution Take 2.5 mg by nebulization every 6 (six) hours as needed for wheezing or shortness of breath.   Yes [provider]  amLODipine (NORVASC) 10 MG tablet Take 10 mg by mouth daily. 04/21/16  Yes [provider]  atorvastatin (LIPITOR) 10 MG tablet Take 1 tablet (10 mg total) by mouth daily at 6 PM. 01/12/18  Yes Boscia, Heather E, NP  budesonide-formoterol (SYMBICORT) 160-4.5 MCG/ACT inhaler Inhale 2 puffs into the lungs 2 (two) times daily.   Yes [provider]  carvedilol (COREG) 6.25 MG tablet Take 6.25 mg by mouth 2 (two) times daily. 10/03/17  Yes [provider]  feeding supplement, ENSURE ENLIVE, (ENSURE ENLIVE) LIQD Take 237 mLs by mouth 3 (three) times daily between meals. 02/08/17  Yes Mody, Ulice Bold, MD  furosemide (LASIX) 20 MG tablet Take 1 tablet (20 mg total) by mouth daily. 12/26/17  Yes Boscia, Greer Ee, NP  hydrALAZINE (APRESOLINE) 50 MG tablet Take 50 mg by mouth 2 (two) times daily.  04/10/16  Yes [provider]  HYDROcodone-acetaminophen (NORCO/VICODIN) 5-325 MG tablet TAKE 1 TABLET BY MOUTH 3 TO 4 TIMES DAILY AS NEEDED FOR PAIN 01/20/17  Yes [provider]  levothyroxine (SYNTHROID, LEVOTHROID) 50 MCG tablet Take 50 mcg by mouth daily before breakfast.   Yes [provider]  losartan (COZAAR) 100 MG tablet Take 100 mg by mouth daily.  02/18/16  Yes [provider]  meclizine (ANTIVERT) 12.5 MG tablet Take 12.5 mg by mouth daily.  06/02/16  Yes [provider]  mirtazapine (REMERON) 15 MG tablet Take 7.5 mg by mouth at bedtime.    Yes [provider]  pantoprazole (PROTONIX) 40 MG tablet Take 1 tablet (40 mg total) by mouth 2 (two) times daily. 12/26/17  Yes Boscia, Heather E, NP  sucralfate (CARAFATE) 1 g tablet Take 1 tablet (1 g total) by mouth 4 (four) times daily -  with meals and at bedtime. 02/08/17  Yes  Bettey Costa, MD    Review of Systems  Constitutional: Positive for fatigue. Negative for appetite change.  HENT: Positive for postnasal drip and rhinorrhea. Negative for congestion and sore throat.   Eyes: Negative.   Respiratory: Positive for cough (productive) and shortness of breath (with moderate exertion). Negative for chest tightness and wheezing.   Cardiovascular: Negative for chest pain, palpitations and leg swelling.  Gastrointestinal: Negative for abdominal distention and abdominal pain.  Endocrine: Negative.   Genitourinary: Negative.   Musculoskeletal: Positive for arthralgias (left hip pain) and back pain.  Skin: Negative.   Allergic/Immunologic: Negative.   Neurological: Negative for dizziness and light-headedness.  Hematological: Negative for adenopathy. Does not bruise/bleed easily.  Psychiatric/Behavioral: Positive for sleep disturbance (wearing oxygen @ 2L; sleeping on 1 pillow). Negative for dysphoric mood. The patient is not nervous/anxious.    Vitals:   01/16/18 1147  BP: 104/72  Pulse: (!) 110  Resp: 20  SpO2: (!) 88%  Weight: 134 lb (60.8 kg)  Height: 5' (1.524 m)   Wt Readings from Last 3 Encounters:  01/16/18 134 lb (60.8 kg)  11/21/17 138 lb (62.6 kg)  11/16/17 136 lb 11.2 oz (62 kg)   Lab Results  Component Value Date   CREATININE 1.82 (H) 05/30/2017   CREATININE 1.33 (H) 05/29/2017   CREATININE 1.44 (H) 05/28/2017    Physical Exam  Constitutional: She is oriented to person, place, and time. She appears well-developed and well-nourished.  HENT:  Head: Normocephalic and atraumatic.  Neck: Normal range of motion. Neck supple. No JVD present.  Cardiovascular: Normal rate and regular rhythm.  Pulmonary/Chest: Effort normal. She has no wheezes. She has no rales.  Abdominal: Soft. She exhibits no distension. There is no tenderness.  Musculoskeletal: She exhibits no edema or tenderness.  Neurological: She is alert and oriented to person, place, and  time.  Skin: Skin is warm and dry.  Psychiatric: She has a normal mood and affect. Her behavior is normal. Thought content normal.  Nursing note and vitals reviewed.  Assessment & Plan:   1: Chronic heart failure with preserved ejection fraction- - NYHA class II - euvolemic today - weighing daily and she says that her weight has been stable. Reminded to call for an overnight weight gain of >2 pounds or a weekly weight gain of >5 pounds - not adding salt and has been reading food labels. Reviewed the importance of closely following a 2000mg  sodium diet  - has been walking 3 times / week for 30 minutes each time - says that she had an echo done ~ 1 month ago  2: HTN- - BP looks good today - saw PCP (Boscia) on 10/18/17 - BMP from 05/30/17 reviewed and shows sodium 138, potassium 3.8 and GFR 32. - follows with nephrology [facility]) who she says increased her carvedilol due to tachycardia  3: COPD-  - wears oxygen at 2L around the clock - follows  with pulmonologist Humphrey Rolls)  - can use her nebulizer 3 times/ day - call pulmonologist if symptoms persist as O2 level was 88% and dropped to 79% upon exertion  4: Allergies- - advised patient to take either claritin/zyrtec daily as well as mucinex dailiy  Patient did not bring her medications nor a list. Each medication was verbally reviewed with the patient and she was encouraged to bring the bottles to every visit to confirm accuracy of list.  Return here in 3 months or sooner for any questions/problems before then.

## 2018-01-16 ENCOUNTER — Ambulatory Visit: Payer: Medicare Other | Attending: Family | Admitting: Family

## 2018-01-16 ENCOUNTER — Encounter: Payer: Self-pay | Admitting: Family

## 2018-01-16 VITALS — BP 104/72 | HR 110 | Resp 20 | Ht 60.0 in | Wt 134.0 lb

## 2018-01-16 DIAGNOSIS — E785 Hyperlipidemia, unspecified: Secondary | ICD-10-CM | POA: Insufficient documentation

## 2018-01-16 DIAGNOSIS — G4700 Insomnia, unspecified: Secondary | ICD-10-CM | POA: Diagnosis not present

## 2018-01-16 DIAGNOSIS — Z9109 Other allergy status, other than to drugs and biological substances: Secondary | ICD-10-CM

## 2018-01-16 DIAGNOSIS — I130 Hypertensive heart and chronic kidney disease with heart failure and stage 1 through stage 4 chronic kidney disease, or unspecified chronic kidney disease: Secondary | ICD-10-CM | POA: Diagnosis not present

## 2018-01-16 DIAGNOSIS — Z7951 Long term (current) use of inhaled steroids: Secondary | ICD-10-CM | POA: Diagnosis not present

## 2018-01-16 DIAGNOSIS — E039 Hypothyroidism, unspecified: Secondary | ICD-10-CM | POA: Insufficient documentation

## 2018-01-16 DIAGNOSIS — Z79890 Hormone replacement therapy (postmenopausal): Secondary | ICD-10-CM | POA: Insufficient documentation

## 2018-01-16 DIAGNOSIS — J410 Simple chronic bronchitis: Secondary | ICD-10-CM

## 2018-01-16 DIAGNOSIS — Z833 Family history of diabetes mellitus: Secondary | ICD-10-CM | POA: Insufficient documentation

## 2018-01-16 DIAGNOSIS — I2510 Atherosclerotic heart disease of native coronary artery without angina pectoris: Secondary | ICD-10-CM | POA: Diagnosis not present

## 2018-01-16 DIAGNOSIS — Z8249 Family history of ischemic heart disease and other diseases of the circulatory system: Secondary | ICD-10-CM | POA: Insufficient documentation

## 2018-01-16 DIAGNOSIS — Z87891 Personal history of nicotine dependence: Secondary | ICD-10-CM | POA: Insufficient documentation

## 2018-01-16 DIAGNOSIS — J449 Chronic obstructive pulmonary disease, unspecified: Secondary | ICD-10-CM | POA: Diagnosis not present

## 2018-01-16 DIAGNOSIS — I10 Essential (primary) hypertension: Secondary | ICD-10-CM

## 2018-01-16 DIAGNOSIS — R911 Solitary pulmonary nodule: Secondary | ICD-10-CM | POA: Diagnosis not present

## 2018-01-16 DIAGNOSIS — Z79899 Other long term (current) drug therapy: Secondary | ICD-10-CM | POA: Insufficient documentation

## 2018-01-16 DIAGNOSIS — I509 Heart failure, unspecified: Secondary | ICD-10-CM | POA: Diagnosis present

## 2018-01-16 DIAGNOSIS — N182 Chronic kidney disease, stage 2 (mild): Secondary | ICD-10-CM | POA: Insufficient documentation

## 2018-01-16 DIAGNOSIS — Z9981 Dependence on supplemental oxygen: Secondary | ICD-10-CM | POA: Insufficient documentation

## 2018-01-16 DIAGNOSIS — I5032 Chronic diastolic (congestive) heart failure: Secondary | ICD-10-CM | POA: Insufficient documentation

## 2018-01-16 NOTE — Patient Instructions (Addendum)
Continue weighing daily and call for an overnight weight gain of > 2 pounds or a weekly weight gain of >5 pounds.  Can take either claritin or zytec once daily (generic is fine).  Can also take mucinex for the cough and use the nebulizer three times daily.  Should cough continue, call your pulmonologist before the weekend comes.

## 2018-01-17 DIAGNOSIS — Z87891 Personal history of nicotine dependence: Secondary | ICD-10-CM | POA: Diagnosis not present

## 2018-01-17 DIAGNOSIS — I130 Hypertensive heart and chronic kidney disease with heart failure and stage 1 through stage 4 chronic kidney disease, or unspecified chronic kidney disease: Secondary | ICD-10-CM | POA: Diagnosis not present

## 2018-01-17 DIAGNOSIS — E039 Hypothyroidism, unspecified: Secondary | ICD-10-CM | POA: Diagnosis present

## 2018-01-17 DIAGNOSIS — J9621 Acute and chronic respiratory failure with hypoxia: Secondary | ICD-10-CM | POA: Diagnosis not present

## 2018-01-17 DIAGNOSIS — J8410 Pulmonary fibrosis, unspecified: Secondary | ICD-10-CM | POA: Diagnosis not present

## 2018-01-17 DIAGNOSIS — J189 Pneumonia, unspecified organism: Secondary | ICD-10-CM | POA: Diagnosis not present

## 2018-01-17 DIAGNOSIS — R0602 Shortness of breath: Secondary | ICD-10-CM | POA: Diagnosis not present

## 2018-01-17 DIAGNOSIS — I700 Atherosclerosis of aorta: Secondary | ICD-10-CM | POA: Diagnosis not present

## 2018-01-17 DIAGNOSIS — N182 Chronic kidney disease, stage 2 (mild): Secondary | ICD-10-CM | POA: Diagnosis not present

## 2018-01-17 DIAGNOSIS — R5383 Other fatigue: Secondary | ICD-10-CM | POA: Diagnosis not present

## 2018-01-17 DIAGNOSIS — R918 Other nonspecific abnormal finding of lung field: Secondary | ICD-10-CM | POA: Diagnosis not present

## 2018-01-17 DIAGNOSIS — R931 Abnormal findings on diagnostic imaging of heart and coronary circulation: Secondary | ICD-10-CM | POA: Diagnosis not present

## 2018-01-17 DIAGNOSIS — J449 Chronic obstructive pulmonary disease, unspecified: Secondary | ICD-10-CM | POA: Diagnosis not present

## 2018-01-17 DIAGNOSIS — J439 Emphysema, unspecified: Secondary | ICD-10-CM | POA: Diagnosis not present

## 2018-01-17 DIAGNOSIS — I1310 Hypertensive heart and chronic kidney disease without heart failure, with stage 1 through stage 4 chronic kidney disease, or unspecified chronic kidney disease: Secondary | ICD-10-CM | POA: Diagnosis not present

## 2018-01-17 DIAGNOSIS — E876 Hypokalemia: Secondary | ICD-10-CM | POA: Diagnosis not present

## 2018-01-17 DIAGNOSIS — J90 Pleural effusion, not elsewhere classified: Secondary | ICD-10-CM | POA: Diagnosis not present

## 2018-01-17 DIAGNOSIS — E871 Hypo-osmolality and hyponatremia: Secondary | ICD-10-CM | POA: Diagnosis not present

## 2018-01-17 DIAGNOSIS — I340 Nonrheumatic mitral (valve) insufficiency: Secondary | ICD-10-CM | POA: Diagnosis not present

## 2018-01-17 DIAGNOSIS — D631 Anemia in chronic kidney disease: Secondary | ICD-10-CM | POA: Diagnosis not present

## 2018-01-17 DIAGNOSIS — Z9981 Dependence on supplemental oxygen: Secondary | ICD-10-CM | POA: Diagnosis not present

## 2018-01-17 DIAGNOSIS — I5032 Chronic diastolic (congestive) heart failure: Secondary | ICD-10-CM | POA: Diagnosis not present

## 2018-01-17 DIAGNOSIS — A419 Sepsis, unspecified organism: Secondary | ICD-10-CM | POA: Diagnosis not present

## 2018-01-17 DIAGNOSIS — J309 Allergic rhinitis, unspecified: Secondary | ICD-10-CM | POA: Diagnosis present

## 2018-01-17 DIAGNOSIS — R0902 Hypoxemia: Secondary | ICD-10-CM | POA: Diagnosis not present

## 2018-01-17 DIAGNOSIS — I272 Pulmonary hypertension, unspecified: Secondary | ICD-10-CM | POA: Diagnosis present

## 2018-01-17 DIAGNOSIS — N179 Acute kidney failure, unspecified: Secondary | ICD-10-CM | POA: Diagnosis not present

## 2018-01-21 DIAGNOSIS — Z9981 Dependence on supplemental oxygen: Secondary | ICD-10-CM | POA: Insufficient documentation

## 2018-01-22 MED ORDER — AMLODIPINE BESYLATE 10 MG PO TABS
10.00 | ORAL_TABLET | ORAL | Status: DC
Start: 2018-01-23 — End: 2018-01-22

## 2018-01-22 MED ORDER — LEVOTHYROXINE SODIUM 50 MCG PO TABS
50.00 | ORAL_TABLET | ORAL | Status: DC
Start: 2018-01-23 — End: 2018-01-22

## 2018-01-22 MED ORDER — ENOXAPARIN SODIUM 30 MG/0.3ML ~~LOC~~ SOLN
30.00 | SUBCUTANEOUS | Status: DC
Start: 2018-01-23 — End: 2018-01-22

## 2018-01-22 MED ORDER — BUDESONIDE-FORMOTEROL FUMARATE 160-4.5 MCG/ACT IN AERO
2.00 | INHALATION_SPRAY | RESPIRATORY_TRACT | Status: DC
Start: 2018-01-22 — End: 2018-01-22

## 2018-01-22 MED ORDER — SUCRALFATE 1 G PO TABS
1.00 | ORAL_TABLET | ORAL | Status: DC
Start: 2018-01-22 — End: 2018-01-22

## 2018-01-22 MED ORDER — ASPIRIN EC 81 MG PO TBEC
81.00 | DELAYED_RELEASE_TABLET | ORAL | Status: DC
Start: 2018-01-23 — End: 2018-01-22

## 2018-01-22 MED ORDER — ATORVASTATIN CALCIUM 40 MG PO TABS
40.00 | ORAL_TABLET | ORAL | Status: DC
Start: 2018-01-22 — End: 2018-01-22

## 2018-01-22 MED ORDER — PANTOPRAZOLE SODIUM 40 MG PO TBEC
40.00 | DELAYED_RELEASE_TABLET | ORAL | Status: DC
Start: 2018-01-23 — End: 2018-01-22

## 2018-01-22 MED ORDER — FLUTICASONE PROPIONATE 50 MCG/ACT NA SUSP
1.00 | NASAL | Status: DC
Start: 2018-01-23 — End: 2018-01-22

## 2018-01-22 MED ORDER — LIDOCAINE 5 % EX PTCH
1.00 | MEDICATED_PATCH | CUTANEOUS | Status: DC
Start: 2018-01-23 — End: 2018-01-22

## 2018-01-22 MED ORDER — GENERIC EXTERNAL MEDICATION
1.00 | Status: DC
Start: 2018-01-22 — End: 2018-01-22

## 2018-01-22 MED ORDER — IPRATROPIUM-ALBUTEROL 0.5-2.5 (3) MG/3ML IN SOLN
3.00 | RESPIRATORY_TRACT | Status: DC
Start: ? — End: 2018-01-22

## 2018-01-22 MED ORDER — LIDOCAINE HCL 1 % IJ SOLN
0.50 | INTRAMUSCULAR | Status: DC
Start: ? — End: 2018-01-22

## 2018-01-22 MED ORDER — CARVEDILOL 3.125 MG PO TABS
6.25 | ORAL_TABLET | ORAL | Status: DC
Start: 2018-01-22 — End: 2018-01-22

## 2018-01-22 MED ORDER — FUROSEMIDE 20 MG PO TABS
20.00 | ORAL_TABLET | ORAL | Status: DC
Start: 2018-01-23 — End: 2018-01-22

## 2018-01-22 MED ORDER — MIRTAZAPINE 15 MG PO TABS
7.50 | ORAL_TABLET | ORAL | Status: DC
Start: 2018-01-22 — End: 2018-01-22

## 2018-01-22 MED ORDER — ACETAMINOPHEN 325 MG PO TABS
650.00 | ORAL_TABLET | ORAL | Status: DC
Start: ? — End: 2018-01-22

## 2018-01-27 ENCOUNTER — Other Ambulatory Visit: Payer: Self-pay

## 2018-01-27 MED ORDER — SUCRALFATE 1 G PO TABS: 1.0000 g | ORAL_TABLET | Freq: Three times a day (TID) | ORAL | 3 refills | Status: DC

## 2018-02-06 ENCOUNTER — Ambulatory Visit (INDEPENDENT_AMBULATORY_CARE_PROVIDER_SITE_OTHER): Payer: Medicare Other | Admitting: Internal Medicine

## 2018-02-06 ENCOUNTER — Encounter: Payer: Self-pay | Admitting: Internal Medicine

## 2018-02-06 VITALS — BP 134/40 | HR 91 | Resp 16 | Ht 60.0 in | Wt 134.4 lb

## 2018-02-06 DIAGNOSIS — J301 Allergic rhinitis due to pollen: Secondary | ICD-10-CM

## 2018-02-06 DIAGNOSIS — I2721 Secondary pulmonary arterial hypertension: Secondary | ICD-10-CM

## 2018-02-06 DIAGNOSIS — J209 Acute bronchitis, unspecified: Secondary | ICD-10-CM | POA: Diagnosis not present

## 2018-02-06 DIAGNOSIS — J440 Chronic obstructive pulmonary disease with acute lower respiratory infection: Secondary | ICD-10-CM | POA: Diagnosis not present

## 2018-02-06 DIAGNOSIS — J9611 Chronic respiratory failure with hypoxia: Secondary | ICD-10-CM | POA: Diagnosis not present

## 2018-02-06 MED ORDER — LEVOFLOXACIN 500 MG PO TABS: 500.0000 mg | ORAL_TABLET | Freq: Every day | ORAL | 0 refills | Status: DC

## 2018-02-06 MED ORDER — LORATADINE 10 MG PO TABS: 10.0000 mg | ORAL_TABLET | Freq: Every day | ORAL | 11 refills | Status: DC

## 2018-02-06 NOTE — Progress Notes (Signed)
[facility] [place], [place] [address]  Pulmonary Sleep Medicine   Office Visit Note  Patient Name: Melissa Knox DOB: [birth year] MRN [id number]  Date of Service: 02/06/2018  Complaints/HPI:  She states that she was in the hospital after having taken fall.  She was having some cough symptoms since she was discharged.  She states she has had no fevers or chills noted cough is productive of sputum.  She denies having any chest pain.  She is using her oxygen as prescribed for her COPD.  In addition she is having some allergies issues.  She is not currently taking any antihistamines and spoke with her and discussed with her the medications that she can take.  We will go ahead and give her a script for the [place] 10  ROS  General: (-) fever, (-) chills, (-) night sweats, (-) weakness Skin: (-) rashes, (-) itching,. Eyes: (-) visual changes, (-) redness, (-) itching. Nose and Sinuses: (-) nasal stuffiness or itchiness, (-) postnasal drip, (-) nosebleeds, (-) sinus trouble. Mouth and Throat: (-) sore throat, (-) hoarseness. Neck: (-) swollen glands, (-) enlarged thyroid, (-) neck pain. Respiratory: + cough, (-) bloody sputum, + shortness of breath, - wheezing. Cardiovascular: - ankle swelling, (-) chest pain. Lymphatic: (-) lymph node enlargement. Neurologic: (-) numbness, (-) tingling. Psychiatric: (-) anxiety, (-) depression   Current Medication: Outpatient Encounter Medications as of 02/06/2018  Medication Sig  . albuterol (PROVENTIL) (2.5 MG/3ML) 0.083% nebulizer solution Take 2.5 mg by nebulization every 6 (six) hours as needed for wheezing or shortness of breath.  Marland Kitchen amLODipine (NORVASC) 10 MG tablet Take 10 mg by mouth daily.  Marland Kitchen atorvastatin (LIPITOR) 10 MG tablet Take 1 tablet (10 mg total) by mouth daily at 6 PM.  . budesonide-formoterol (SYMBICORT) 160-4.5 MCG/ACT inhaler Inhale 2 puffs into the lungs 2 (two) times daily.  . carvedilol (COREG) 6.25 MG  tablet Take 6.25 mg by mouth 2 (two) times daily.  . feeding supplement, ENSURE ENLIVE, (ENSURE ENLIVE) LIQD Take 237 mLs by mouth 3 (three) times daily between meals.  . furosemide (LASIX) 20 MG tablet Take 1 tablet (20 mg total) by mouth daily.  . hydrALAZINE (APRESOLINE) 50 MG tablet Take 50 mg by mouth 2 (two) times daily.   Marland Kitchen HYDROcodone-acetaminophen (NORCO/VICODIN) 5-325 MG tablet TAKE 1 TABLET BY MOUTH 3 TO 4 TIMES DAILY AS NEEDED FOR PAIN  . levothyroxine (SYNTHROID, LEVOTHROID) 50 MCG tablet Take 50 mcg by mouth daily before breakfast.  . losartan (COZAAR) 100 MG tablet Take 100 mg by mouth daily.  . meclizine (ANTIVERT) 12.5 MG tablet Take 12.5 mg by mouth daily.   . mirtazapine (REMERON) 15 MG tablet Take 7.5 mg by mouth at bedtime.   . pantoprazole (PROTONIX) 40 MG tablet Take 1 tablet (40 mg total) by mouth 2 (two) times daily.  . sucralfate (CARAFATE) 1 g tablet Take 1 tablet (1 g total) by mouth 4 (four) times daily -  with meals and at bedtime.   No facility-administered encounter medications on file as of 02/06/2018.     Surgical History: Past Surgical History:  Procedure Laterality Date  . GALLBLADDER SURGERY  2012    Medical History: Past Medical History:  Diagnosis Date  . Atherosclerotic heart disease 06/12/2015  . CHF (congestive heart failure) ([place])   . CKD (chronic kidney disease) stage 2, GFR 60-89 ml/min 06/12/2015  . Hypercalcemia 06/12/2015  . Hyperlipidemia 06/12/2015  . Hypertension 06/12/2015  . Hypothyroidism 06/12/2015  . Insomnia  06/12/2015  . Pulmonary heart disease ([place]) 06/12/2015  . Shortness of breath 06/12/2015  . Solitary pulmonary nodule 06/12/2015    Family History: Family History  Problem Relation Age of Onset  . Cancer Mother   . Hypertension Father   . Diabetes Father   . Cancer Father     Social History: Social History   Socioeconomic History  . Marital status: Married    Spouse name: Daryl  . Number of children: 3  .  Years of education: 66  . Highest education level: 12th grade  Occupational History  . Not on file  Social Needs  . Financial resource strain: Not hard at all  . Food insecurity:    Worry: Never true    Inability: Never true  . Transportation needs:    Medical: No    Non-medical: No  Tobacco Use  . Smoking status: Former Smoker    Last attempt to quit: 2013    Years since quitting: 6.4  . Smokeless tobacco: Never Used  Substance and Sexual Activity  . Alcohol use: No    Alcohol/week: 0.0 oz  . Drug use: No  . Sexual activity: Yes    Birth control/protection: None  Lifestyle  . Physical activity:    Days per week: 2 days    Minutes per session: 30 min  . Stress: Not at all  Relationships  . Social connections:    Talks on phone: Twice a week    Gets together: Once a week    Attends religious service: 1 to 4 times per year    Active member of club or organization: No    Attends meetings of clubs or organizations: Never    Relationship status: Married  . Intimate partner violence:    Fear of current or ex partner: No    Emotionally abused: No    Physically abused: No    Forced sexual activity: No  Other Topics Concern  . Not on file  Social History Narrative  . Not on file    Vital Signs: Blood pressure (!) 134/40, pulse 91, resp. rate 16, height 5' (1.524 m), weight 134 lb 6.4 oz (61 kg), SpO2 93 %.  Examination: General Appearance: The patient is well-developed, well-nourished, and in no distress. Skin: Gross inspection of skin unremarkable. Head: normocephalic, no gross deformities. Eyes: no gross deformities noted. ENT: ears appear grossly normal no exudates. Neck: Supple. No thyromegaly. No LAD. Respiratory: few rhonchi noted. Cardiovascular: Normal S1 and S2 without murmur or rub. Extremities: No cyanosis. pulses are equal. Neurologic: Alert and oriented. No involuntary movements.  LABS: Recent Results (from the past 2160 hour(s))  Ferritin      Status: None   Collection Time: 11/16/17  1:35 PM  Result Value Ref Range   Ferritin 89 11 - 307 ng/mL    Comment: Performed at [facility], [address] [place], [place] [address]  Iron and TIBC     Status: None   Collection Time: 11/16/17  1:35 PM  Result Value Ref Range   Iron 43 28 - 170 ug/dL   TIBC 277 250 - 450 ug/dL   Saturation Ratios 16 10.4 - 31.8 %   UIBC 234 ug/dL    Comment: Performed at [facility], [address] [place], [place] [address]  CBC with Differential/Platelet     Status: Abnormal   Collection Time: 11/16/17  1:35 PM  Result Value Ref Range   WBC 6.7 3.6 - 11.0 K/uL  RBC 3.46 (L) 3.80 - 5.20 MIL/uL   Hemoglobin 10.4 (L) 12.0 - 16.0 g/dL   HCT 31.4 (L) 35.0 - 47.0 %   MCV 90.6 80.0 - 100.0 fL   MCH 29.9 26.0 - 34.0 pg   MCHC 33.0 32.0 - 36.0 g/dL   RDW 14.7 (H) 11.5 - 14.5 %   Platelets 492 (H) 150 - 440 K/uL   Neutrophils Relative % 69 %   Neutro Abs 4.6 1.4 - 6.5 K/uL   Lymphocytes Relative 19 %   Lymphs Abs 1.3 1.0 - 3.6 K/uL   Monocytes Relative 6 %   Monocytes Absolute 0.4 0.2 - 0.9 K/uL   Eosinophils Relative 5 %   Eosinophils Absolute 0.3 0 - 0.7 K/uL   Basophils Relative 1 %   Basophils Absolute 0.1 0 - 0.1 K/uL    Comment: Performed at [facility], [address], [place], [place] [address]    Radiology: Ct Head Wo Contrast  Result Date: 05/28/2017 CLINICAL DATA:  Weakness x1 week EXAM: CT HEAD WITHOUT CONTRAST TECHNIQUE: Contiguous axial images were obtained from the base of the skull through the vertex without intravenous contrast. COMPARISON:  None. FINDINGS: Brain: Chronic appearing small vessel ischemic disease of periventricular white matter and the centrum semiovale. Remote appearing right caudate head lacunar infarct. No hydrocephalus. Midline basal cisterns and fourth ventricle without effacement. No large vascular territory infarct. No acute intracranial mass, hemorrhage or midline shift. No  extra-axial fluid collections. Vascular: Moderate atherosclerosis of the carotid siphons. No hyperdense vessels. Skull: Negative for fracture or focal lesion. Sinuses/Orbits: No acute finding. Other: None. IMPRESSION: 1. Chronic appearing small vessel ischemic disease. Chronic right caudate head lacunar infarct. 2. No acute intracranial abnormality nor suspicious osseous findings. Electronically Signed   By: [name] Royalty M.D.   On: 05/28/2017 18:17   Ct Angio Chest Pe W And/or Wo Contrast  Result Date: 05/28/2017 CLINICAL DATA:  Hypoxia.  Weakness.  Pulmonary embolism suspected. EXAM: CT ANGIOGRAPHY CHEST WITH CONTRAST TECHNIQUE: Multidetector CT imaging of the chest was performed using the standard protocol during bolus administration of intravenous contrast. Multiplanar CT image reconstructions and MIPs were obtained to evaluate the vascular anatomy. CONTRAST:  60 cc Isovue 370 IV. COMPARISON:  Chest radiograph from earlier today. 08/13/2014 chest CT. FINDINGS: Cardiovascular: The study is high quality for the evaluation of pulmonary embolism. There are no filling defects in the central, lobar, segmental or subsegmental pulmonary artery branches to suggest acute pulmonary embolism. Atherosclerotic nonaneurysmal thoracic aorta. Main pulmonary artery diameter 3.0 cm, top-normal. Borderline mild cardiomegaly. No significant pericardial fluid/thickening. Left anterior descending and right coronary atherosclerosis. Mediastinum/Nodes: No discrete thyroid nodules. Unremarkable esophagus. No axillary adenopathy. Newly mildly enlarged 1.3 cm right paratracheal node (series 4/ image 36). Newly mildly enlarged 1.0 cm subcarinal node (series 4/ image 46). Newly mildly enlarged 1.1 cm right hilar node (series 4/ image 44). Newly mildly enlarged 1.0 cm left hilar node (series 4/ image 47). Lungs/Pleura: No pneumothorax. No pleural effusion. No acute consolidative airspace disease. Severe centrilobular and paraseptal  emphysema throughout both lungs with bullous emphysema at the lung apices and lung bases bilaterally. New ground-glass attenuation and prominent interlobular septal thickening throughout both lungs. Vague subsolid opacity in the superior segment right lower lobe (series 6/ image 36), cannot exclude underlying pulmonary nodule in this location. Upper abdomen: Stable scarring in the upper right kidney. No acute abnormality in the visualized upper abdomen. Musculoskeletal: No aggressive appearing focal osseous lesions. Mild thoracic spondylosis. Review  of the MIP images confirms the above findings. IMPRESSION: 1. No pulmonary embolism . 2. Severe emphysema, including bullous emphysema at the lung apices and lung bases. 3. Spectrum of findings suggestive of congestive heart failure. Borderline mild cardiomegaly. New ground-glass attenuation and interlobular septal thickening throughout both lungs, suggesting pulmonary edema. 4. Vague sub solid opacity in the superior segment right lower lobe, cannot exclude underlying pulmonary nodule in this location. Recommend follow-up chest CT in 3 months. 5. New mild mediastinal and bilateral hilar adenopathy, nonspecific, probably reactive/congestive. These nodes can also be reassessed on follow-up chest CT in 3 months. 6. Two vessel coronary atherosclerosis. Aortic Atherosclerosis (ICD10-I70.0) and Emphysema (ICD10-J43.9). Electronically Signed   By: Ilona Sorrel M.D.   On: 05/28/2017 20:06   Dg Chest Portable 1 View  Result Date: 05/28/2017 CLINICAL DATA:  Hypoxia. EXAM: PORTABLE CHEST 1 VIEW COMPARISON:  10/03/2012 FINDINGS: The heart size appears normal. Aortic atherosclerosis noted. There is no pleural effusion identified. Advanced changes of centrilobular and paraseptal emphysema again identified. Airspace opacity within the right mid lung is identified which may reflect superimposed pneumonia. IMPRESSION: 1. Right midlung opacity which may represent pneumonia. Followup  PA and lateral chest X-ray is recommended in 3-4 weeks following trial of antibiotic therapy to ensure resolution and exclude underlying malignancy. 2. Aortic Atherosclerosis (ICD10-I70.0) and Emphysema (ICD10-J43.9). Electronically Signed   By: Kerby Moors M.D.   On: 05/28/2017 17:51    No results found.  No results found.    Assessment and Plan: Patient Active Problem List   Diagnosis Date Noted  . On supplemental oxygen therapy 01/21/2018  . Acute on chronic respiratory failure with hypoxia ([place]) 01/17/2018  . Pollen allergies 01/16/2018  . CHF (congestive heart failure) ([place]) 05/28/2017  . Syncope 02/06/2017  . Pancreatic mass 02/06/2017  . Iron deficiency anemia 05/01/2016  . Anemia in chronic renal disease 04/26/2016  . Insomnia 06/12/2015  . CKD (chronic kidney disease) stage 2, GFR 60-89 ml/min 06/12/2015  . Hypercalcemia 06/12/2015  . Hypothyroidism 06/12/2015  . Pulmonary heart disease ([place]) 06/12/2015  . Hyperlipidemia 06/12/2015  . Hypertension 06/12/2015  . Atherosclerotic heart disease 06/12/2015  . Solitary pulmonary nodule 06/12/2015  . COPD (chronic obstructive pulmonary disease) ([place]) 06/12/2015    1. COPD acute bronchitis prescription for Levaquin was given today for acute bronchitis.  Likely some leftover residual from the hospitalization.  She does not have any signs of pneumonitis so therefore hold off on chest x-ray for now. 2. PAH  Continue with supportive care oxygen therapy prognosis guarded 3. Allergi rhinitis claritin prescription was given to her today 4. Chronic respiratory failure with hypoxia continue with oxygen on the present flow rate she is tolerating it well  General Counseling: I have discussed the findings of the evaluation and examination with Gwyndolyn.  I have also discussed any further diagnostic evaluation thatmay be needed or ordered today. Jacquelinne verbalizes understanding of the findings of todays visit. We also reviewed her  medications today and discussed drug interactions and side effects including but not limited excessive drowsiness and altered mental states. We also discussed that there is always a risk not just to her but also people around her. she has been encouraged to call the office with any questions or concerns that should arise related to todays visit.    Time spent: 16min  I have personally obtained a history, examined the patient, evaluated laboratory and imaging results, formulated the assessment and plan and placed orders.    Allyne Gee,  MD [facility] Pulmonary and Critical Care Sleep medicine

## 2018-02-06 NOTE — Patient Instructions (Signed)

## 2018-02-13 NOTE — Progress Notes (Signed)
[place]  [phone]) [phone] [phone]) [phone]  ID: [name] OB: 01-21-1951  MR#: [id number]  NOB#:[id number]  Patient Care Team: Ronnell Freshwater, NP as PCP - General (Family Medicine) Clent Jacks, RN as Registered Nurse  CHIEF COMPLAINT: Anemia in chronic renal disease, iron deficiency anemia.  INTERVAL HISTORY: Patient returns to clinic today for repeat laboratory work and further evaluation.  She was recently in the hospital for pneumonia.  She continues to have chronic weakness and fatigue which has been worse over the past several weeks.  She otherwise feels well.  She has no neurologic complaints.  She denies any chest pain or shortness of breath. She denies any nausea, vomiting, constipation, or diarrhea. She has no melena or hematochezia. She has no urinary complaints.  Patient offers no further specific complaints today.  REVIEW OF SYSTEMS:   Review of Systems  Constitutional: Positive for malaise/fatigue. Negative for fever and weight loss.  Respiratory: Negative.  Negative for cough, hemoptysis and shortness of breath.   Cardiovascular: Negative.  Negative for chest pain and leg swelling.  Gastrointestinal: Negative.  Negative for abdominal pain, blood in stool and melena.  Genitourinary: Negative.  Negative for dysuria.  Musculoskeletal: Negative.  Negative for back pain.  Skin: Negative.  Negative for rash.  Neurological: Positive for weakness. Negative for sensory change and focal weakness.  Endo/Heme/Allergies: Does not bruise/bleed easily.  Psychiatric/Behavioral: Negative.  The patient is not nervous/anxious.     As per HPI. Otherwise, a complete review of systems is negative.  PAST MEDICAL HISTORY: Past Medical History:  Diagnosis Date  . Atherosclerotic heart disease 06/12/2015  . CHF (congestive heart failure) ([place])   . CKD (chronic kidney disease) stage 2, GFR 60-89 ml/min 06/12/2015  . Hypercalcemia 06/12/2015  .  Hyperlipidemia 06/12/2015  . Hypertension 06/12/2015  . Hypothyroidism 06/12/2015  . Insomnia 06/12/2015  . 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 Shortness of breath 06/12/2015  . Solitary pulmonary nodule 06/12/2015    PAST SURGICAL HISTORY: Past Surgical History:  Procedure Laterality Date  . GALLBLADDER SURGERY  2012    FAMILY HISTORY: Family History  Problem Relation Age of Onset  . Cancer Mother   . Hypertension Father   . Diabetes Father   . Cancer Father        ADVANCED DIRECTIVES (Y/N):  N   HEALTH MAINTENANCE: Social History   Tobacco Use  . Smoking status: Former Smoker    Last attempt to quit: 2013    Years since quitting: 6.4  . Smokeless tobacco: Never Used  Substance Use Topics  . Alcohol use: No    Alcohol/week: 0.0 oz  . Drug use: No     Colonoscopy:  PAP:  Bone density:  Lipid panel:  No Known Allergies  Current Outpatient Medications  Medication Sig Dispense Refill  . albuterol (PROVENTIL) (2.5 MG/3ML) 0.083% nebulizer solution Take 2.5 mg by nebulization every 6 (six) hours as needed for wheezing or shortness of breath.    Marland Kitchen amLODipine (NORVASC) 10 MG tablet Take 10 mg by mouth daily.  12  . aspirin EC 81 MG tablet Take by mouth.    Marland Kitchen atorvastatin (LIPITOR) 10 MG tablet Take 1 tablet (10 mg total) by mouth daily at 6 PM. 30 tablet 3  . budesonide-formoterol (SYMBICORT) 160-4.5 MCG/ACT inhaler Inhale 2 puffs into the lungs 2 (two) times daily.    . carvedilol (COREG) 6.25 MG tablet Take 6.25 mg by mouth 2 (two) times daily.  6  .  feeding supplement, ENSURE ENLIVE, (ENSURE ENLIVE) LIQD Take 237 mLs by mouth 3 (three) times daily between meals. 237 mL 12  . furosemide (LASIX) 20 MG tablet Take 1 tablet (20 mg total) by mouth daily. 30 tablet 3  . hydrALAZINE (APRESOLINE) 50 MG tablet Take 50 mg by mouth 2 (two) times daily.   6  . levothyroxine (SYNTHROID, LEVOTHROID) 50 MCG tablet Take 50 mcg by mouth daily before breakfast.    .  loratadine (CLARITIN) 10 MG tablet Take 1 tablet (10 mg total) by mouth daily. 30 tablet 11  . losartan (COZAAR) 100 MG tablet Take 100 mg by mouth daily.  3  . meclizine (ANTIVERT) 12.5 MG tablet Take 12.5 mg by mouth daily.     . pantoprazole (PROTONIX) 40 MG tablet Take 1 tablet (40 mg total) by mouth 2 (two) times daily. 30 tablet 3  . sucralfate (CARAFATE) 1 g tablet Take 1 tablet (1 g total) by mouth 4 (four) times daily -  with meals and at bedtime. 120 tablet 3  . mirtazapine (REMERON) 15 MG tablet Take 0.5 tablets (7.5 mg total) by mouth at bedtime. 30 tablet 3   No current facility-administered medications for this visit.     OBJECTIVE: Vitals:   02/16/18 1434  BP: (!) 160/65  Pulse: (!) 116  Resp: 18  Temp: (!) 97.4 F (36.3 C)     Body mass index is 26.25 kg/m.    ECOG FS:0 - Asymptomatic  General: Well-developed, well-nourished, no acute distress. Eyes: Pink conjunctiva, anicteric sclera. Lungs: Clear to auscultation bilaterally. Heart: Regular rate and rhythm. No rubs, murmurs, or gallops. Abdomen: Soft, nontender, nondistended. No organomegaly noted, normoactive bowel sounds. Musculoskeletal: No edema, cyanosis, or clubbing. Neuro: Alert, answering all questions appropriately. Cranial nerves grossly intact. Skin: No rashes or petechiae noted. Psych: Normal affect.  LAB RESULTS:  Lab Results  Component Value Date   NA 138 05/30/2017   K 3.8 05/30/2017   CL 103 05/30/2017   CO2 27 05/30/2017   GLUCOSE 143 (H) 05/30/2017   BUN 35 (H) 05/30/2017   CREATININE 1.82 (H) 05/30/2017   CALCIUM 9.6 05/30/2017   PROT 6.9 02/07/2017   ALBUMIN 2.9 (L) 02/07/2017   AST 18 02/07/2017   ALT 11 (L) 02/07/2017   ALKPHOS 50 02/07/2017   BILITOT 0.6 02/07/2017   GFRNONAA 28 (L) 05/30/2017   GFRAA 32 (L) 05/30/2017    Lab Results  Component Value Date   WBC 8.5 02/16/2018   NEUTROABS 5.8 02/16/2018   HGB 7.6 (L) 02/16/2018   HCT 23.2 (L) 02/16/2018   MCV 88.3  02/16/2018   PLT 560 (H) 02/16/2018   Lab Results  Component Value Date   IRON 35 02/16/2018   TIBC 305 02/16/2018   IRONPCTSAT 12 02/16/2018   Lab Results  Component Value Date   FERRITIN 39 02/16/2018     STUDIES: No results found.  ASSESSMENT: Anemia in chronic renal disease, Iron deficiency anemia.  PLAN:    1. Iron deficiency anemia: Patient's hemoglobin has significantly decreased and she is symptomatic.  Iron stores are borderline low.  Previously, the remainder of her laboratory work was either negative or within normal limits.  Proceed with 510 mg IV Feraheme today.  Patient will return to clinic in 1 week for second infusion.  She was then return to clinic in 2 months with repeat laboratory can further evaluation. 2. Anemia in chronic renal disease: Patient will likely benefit from Procrit in the future and  will consider treatment once her iron stores are adequately replaced.   3. Chronic renal insufficiency: Patient's creatinine is elevated, but approximately at her baseline.  Continued evaluation and treatment per nephrology. 4. Hypertension: Patient blood pressure remains significantly elevated.  Continue monitoring and treatment per primary care.   Patient expressed understanding and was in agreement with this plan. She also understands that She can call clinic at any time with any questions, concerns, or complaints.    Lloyd Huger, MD 02/20/18 8:12 AM

## 2018-02-15 ENCOUNTER — Other Ambulatory Visit: Payer: Self-pay | Admitting: *Deleted

## 2018-02-15 DIAGNOSIS — D649 Anemia, unspecified: Secondary | ICD-10-CM

## 2018-02-15 NOTE — Progress Notes (Signed)
cbc

## 2018-02-16 ENCOUNTER — Inpatient Hospital Stay (HOSPITAL_BASED_OUTPATIENT_CLINIC_OR_DEPARTMENT_OTHER): Payer: Medicare Other | Admitting: Oncology

## 2018-02-16 ENCOUNTER — Encounter: Payer: Self-pay | Admitting: Oncology

## 2018-02-16 ENCOUNTER — Inpatient Hospital Stay: Payer: Medicare Other | Attending: Oncology

## 2018-02-16 ENCOUNTER — Inpatient Hospital Stay: Payer: Medicare Other

## 2018-02-16 VITALS — BP 155/69 | HR 97 | Temp 97.7°F | Resp 18

## 2018-02-16 VITALS — BP 160/65 | HR 116 | Temp 97.4°F | Resp 18 | Wt 134.4 lb

## 2018-02-16 DIAGNOSIS — R531 Weakness: Secondary | ICD-10-CM

## 2018-02-16 DIAGNOSIS — Z79899 Other long term (current) drug therapy: Secondary | ICD-10-CM | POA: Insufficient documentation

## 2018-02-16 DIAGNOSIS — Z7982 Long term (current) use of aspirin: Secondary | ICD-10-CM | POA: Diagnosis not present

## 2018-02-16 DIAGNOSIS — D631 Anemia in chronic kidney disease: Secondary | ICD-10-CM | POA: Insufficient documentation

## 2018-02-16 DIAGNOSIS — Z809 Family history of malignant neoplasm, unspecified: Secondary | ICD-10-CM | POA: Insufficient documentation

## 2018-02-16 DIAGNOSIS — Z87891 Personal history of nicotine dependence: Secondary | ICD-10-CM

## 2018-02-16 DIAGNOSIS — I10 Essential (primary) hypertension: Secondary | ICD-10-CM

## 2018-02-16 DIAGNOSIS — R5383 Other fatigue: Secondary | ICD-10-CM | POA: Diagnosis not present

## 2018-02-16 DIAGNOSIS — N182 Chronic kidney disease, stage 2 (mild): Secondary | ICD-10-CM | POA: Diagnosis not present

## 2018-02-16 DIAGNOSIS — D649 Anemia, unspecified: Secondary | ICD-10-CM

## 2018-02-16 DIAGNOSIS — D509 Iron deficiency anemia, unspecified: Secondary | ICD-10-CM

## 2018-02-16 DIAGNOSIS — D508 Other iron deficiency anemias: Secondary | ICD-10-CM

## 2018-02-16 LAB — CBC WITH DIFFERENTIAL/PLATELET
BASOS ABS: 0.1 10*3/uL (ref 0–0.1)
Basophils Relative: 2 %
Eosinophils Absolute: 0.3 10*3/uL (ref 0–0.7)
Eosinophils Relative: 4 %
HCT: 23.2 % — ABNORMAL LOW (ref 35.0–47.0)
Hemoglobin: 7.6 g/dL — ABNORMAL LOW (ref 12.0–16.0)
LYMPHS PCT: 20 %
Lymphs Abs: 1.7 10*3/uL (ref 1.0–3.6)
MCH: 28.8 pg (ref 26.0–34.0)
MCHC: 32.7 g/dL (ref 32.0–36.0)
MCV: 88.3 fL (ref 80.0–100.0)
Monocytes Absolute: 0.5 10*3/uL (ref 0.2–0.9)
Monocytes Relative: 6 %
NEUTROS ABS: 5.8 10*3/uL (ref 1.4–6.5)
Neutrophils Relative %: 68 %
Platelets: 560 10*3/uL — ABNORMAL HIGH (ref 150–440)
RBC: 2.63 MIL/uL — ABNORMAL LOW (ref 3.80–5.20)
RDW: 17.5 % — ABNORMAL HIGH (ref 11.5–14.5)
WBC: 8.5 10*3/uL (ref 3.6–11.0)

## 2018-02-16 LAB — FERRITIN: Ferritin: 39 ng/mL (ref 11–307)

## 2018-02-16 LAB — IRON AND TIBC
Iron: 35 ug/dL (ref 28–170)
Saturation Ratios: 12 % (ref 10.4–31.8)
TIBC: 305 ug/dL (ref 250–450)
UIBC: 270 ug/dL

## 2018-02-16 MED ORDER — FERUMOXYTOL INJECTION 510 MG/17 ML
510.0000 mg | Freq: Once | INTRAVENOUS | Status: AC
  Administered 2018-02-16: 510 mg via INTRAVENOUS
  Filled 2018-02-16: qty 17

## 2018-02-16 MED ORDER — SODIUM CHLORIDE 0.9 % IV SOLN
Freq: Once | INTRAVENOUS | Status: AC
  Administered 2018-02-16: 16:00:00 via INTRAVENOUS
  Filled 2018-02-16: qty 1000

## 2018-02-16 NOTE — Progress Notes (Signed)
B/P 172/61 and HR 116, Per Dr. Grayland Ormond okay to proceed with Feraheme only today.

## 2018-02-16 NOTE — Progress Notes (Signed)
Patient reports she was recently discharged from the hospital.

## 2018-02-17 ENCOUNTER — Encounter: Payer: Self-pay | Admitting: Nurse Practitioner

## 2018-02-17 ENCOUNTER — Ambulatory Visit (INDEPENDENT_AMBULATORY_CARE_PROVIDER_SITE_OTHER): Payer: Medicare Other | Admitting: Nurse Practitioner

## 2018-02-17 VITALS — BP 127/46 | HR 90 | Resp 16 | Ht 60.0 in | Wt 135.8 lb

## 2018-02-17 DIAGNOSIS — I10 Essential (primary) hypertension: Secondary | ICD-10-CM

## 2018-02-17 DIAGNOSIS — J449 Chronic obstructive pulmonary disease, unspecified: Secondary | ICD-10-CM | POA: Diagnosis not present

## 2018-02-17 DIAGNOSIS — Z9981 Dependence on supplemental oxygen: Secondary | ICD-10-CM | POA: Diagnosis not present

## 2018-02-17 DIAGNOSIS — F329 Major depressive disorder, single episode, unspecified: Secondary | ICD-10-CM | POA: Insufficient documentation

## 2018-02-17 DIAGNOSIS — J4489 Other specified chronic obstructive pulmonary disease: Secondary | ICD-10-CM | POA: Insufficient documentation

## 2018-02-17 DIAGNOSIS — E039 Hypothyroidism, unspecified: Secondary | ICD-10-CM | POA: Diagnosis not present

## 2018-02-17 MED ORDER — MIRTAZAPINE 15 MG PO TABS: 7.5000 mg | ORAL_TABLET | Freq: Every day | ORAL | 3 refills | Status: DC

## 2018-02-17 NOTE — Progress Notes (Signed)
[facility] [place], [place] [address]  Internal MEDICINE  Office Visit Note  Patient Name: Melissa Knox  [id number]  [id number]  Date of Service: 02/17/2018   Pt is here for routine follow up.   Chief Complaint  Patient presents with  . Bronchitis    41month follow up    The patient was hospitalized in late may for pneumonia. Was in hospital for about a week. Still has cough. Currently on oxygen at 2 liters per minute. She has regained most of her strength and feels mostly better. States that she needs refill for her remeron. Helps her to sleep well. Appetite is good. Feels less nervous on this medication.       Current Medication: Outpatient Encounter Medications as of 02/17/2018  Medication Sig  . albuterol (PROVENTIL) (2.5 MG/3ML) 0.083% nebulizer solution Take 2.5 mg by nebulization every 6 (six) hours as needed for wheezing or shortness of breath.  Marland Kitchen amLODipine (NORVASC) 10 MG tablet Take 10 mg by mouth daily.  Marland Kitchen aspirin EC 81 MG tablet Take by mouth.  Marland Kitchen atorvastatin (LIPITOR) 10 MG tablet Take 1 tablet (10 mg total) by mouth daily at 6 PM.  . budesonide-formoterol (SYMBICORT) 160-4.5 MCG/ACT inhaler Inhale 2 puffs into the lungs 2 (two) times daily.  . carvedilol (COREG) 6.25 MG tablet Take 6.25 mg by mouth 2 (two) times daily.  . feeding supplement, ENSURE ENLIVE, (ENSURE ENLIVE) LIQD Take 237 mLs by mouth 3 (three) times daily between meals.  . furosemide (LASIX) 20 MG tablet Take 1 tablet (20 mg total) by mouth daily.  . hydrALAZINE (APRESOLINE) 50 MG tablet Take 50 mg by mouth 2 (two) times daily.   Marland Kitchen levothyroxine (SYNTHROID, LEVOTHROID) 50 MCG tablet Take 50 mcg by mouth daily before breakfast.  . loratadine (CLARITIN) 10 MG tablet Take 1 tablet (10 mg total) by mouth daily.  Marland Kitchen losartan (COZAAR) 100 MG tablet Take 100 mg by mouth daily.  . meclizine (ANTIVERT) 12.5 MG tablet Take 12.5 mg by mouth daily.   . mirtazapine (REMERON) 15 MG  tablet Take 0.5 tablets (7.5 mg total) by mouth at bedtime.  . pantoprazole (PROTONIX) 40 MG tablet Take 1 tablet (40 mg total) by mouth 2 (two) times daily.  . sucralfate (CARAFATE) 1 g tablet Take 1 tablet (1 g total) by mouth 4 (four) times daily -  with meals and at bedtime.  . [DISCONTINUED] mirtazapine (REMERON) 15 MG tablet Take 7.5 mg by mouth at bedtime.    No facility-administered encounter medications on file as of 02/17/2018.     Surgical History: Past Surgical History:  Procedure Laterality Date  . GALLBLADDER SURGERY  2012    Medical History: Past Medical History:  Diagnosis Date  . Atherosclerotic heart disease 06/12/2015  . CHF (congestive heart failure) ([place])   . CKD (chronic kidney disease) stage 2, GFR 60-89 ml/min 06/12/2015  . Hypercalcemia 06/12/2015  . Hyperlipidemia 06/12/2015  . Hypertension 06/12/2015  . Hypothyroidism 06/12/2015  . Insomnia 06/12/2015  . 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 Shortness of breath 06/12/2015  . Solitary pulmonary nodule 06/12/2015    Family History: Family History  Problem Relation Age of Onset  . Cancer Mother   . Hypertension Father   . Diabetes Father   . Cancer Father     Social History   Socioeconomic History  . Marital status: Married    Spouse name: Daryl  . Number of children: 3  . Years of education: 38  .  Highest education level: 12th grade  Occupational History  . Not on file  Social Needs  . Financial resource strain: Not hard at all  . Food insecurity:    Worry: Never true    Inability: Never true  . Transportation needs:    Medical: No    Non-medical: No  Tobacco Use  . Smoking status: Former Smoker    Last attempt to quit: 2013    Years since quitting: 6.4  . Smokeless tobacco: Never Used  Substance and Sexual Activity  . Alcohol use: No    Alcohol/week: 0.0 oz  . Drug use: No  . Sexual activity: Yes    Birth control/protection: None  Lifestyle  . Physical activity:     Days per week: 2 days    Minutes per session: 30 min  . Stress: Not at all  Relationships  . Social connections:    Talks on phone: Twice a week    Gets together: Once a week    Attends religious service: 1 to 4 times per year    Active member of club or organization: No    Attends meetings of clubs or organizations: Never    Relationship status: Married  . Intimate partner violence:    Fear of current or ex partner: No    Emotionally abused: No    Physically abused: No    Forced sexual activity: No  Other Topics Concern  . Not on file  Social History Narrative  . Not on file      Review of Systems  Constitutional: Negative for activity change, chills, fatigue and fever.  HENT: Positive for congestion, sore throat and voice change. Negative for ear pain, postnasal drip, rhinorrhea and sinus pain.   Eyes: Negative.   Respiratory: Positive for cough and wheezing. Negative for shortness of breath.   Cardiovascular: Negative for chest pain and palpitations.  Gastrointestinal: Negative for constipation, diarrhea, nausea and vomiting.  Endocrine: Negative for cold intolerance, heat intolerance, polydipsia, polyphagia and polyuria.  Genitourinary: Negative.   Musculoskeletal: Positive for myalgias.  Skin: Negative.   Allergic/Immunologic: Positive for environmental allergies.  Neurological: Positive for headaches.  Hematological: Positive for adenopathy.  Psychiatric/Behavioral: Positive for dysphoric mood. The patient is nervous/anxious.    Today's Vitals   02/17/18 0842 02/17/18 0844  BP: (!) 127/46   Pulse: 90   Resp: 16   SpO2: 96% 93%  Weight: 135 lb 12.8 oz (61.6 kg)   Height: 5' (1.524 m)     Physical Exam  Constitutional: She is oriented to person, place, and time. She appears well-developed and well-nourished. She appears ill. No distress.  HENT:  Head: Normocephalic and atraumatic.  Right Ear: Tympanic membrane is bulging.  Left Ear: Tympanic membrane is  bulging.  Nose: Rhinorrhea present. Right sinus exhibits maxillary sinus tenderness and frontal sinus tenderness. Left sinus exhibits maxillary sinus tenderness and frontal sinus tenderness.  Mouth/Throat: Oropharynx is clear and moist. No oropharyngeal exudate.  Eyes: Pupils are equal, round, and reactive to light. Conjunctivae and EOM are normal.  Neck: Normal range of motion. Neck supple. No JVD present. No tracheal deviation present. No thyromegaly present.  Cardiovascular: Normal rate, regular rhythm and normal heart sounds. Exam reveals no gallop and no friction rub.  No murmur heard. Pulmonary/Chest: Effort normal. No respiratory distress. She has wheezes. She has rales. She exhibits no tenderness.  Wheezes clear with cough. Using nasal cannula oxygen at all times.   Abdominal: Soft. Bowel sounds are normal. There is  no tenderness.  Musculoskeletal: Normal range of motion.  Lymphadenopathy:    She has no cervical adenopathy.  Neurological: She is alert and oriented to person, place, and time. No cranial nerve deficit.  Skin: Skin is warm and dry. She is not diaphoretic.  Psychiatric: She has a normal mood and affect. Her behavior is normal. Judgment and thought content normal.  Nursing note and vitals reviewed.  Assessment/Plan: 1. Obstructive chronic bronchitis without exacerbation ([place]) Patient status improved. Continue symbicort twice daily. Use albuterol neb treatments as needed and as prescribed.   2. On supplemental oxygen therapy Continue oxygen therapy as prescribed.   3. Essential hypertension Stable. Continue bp medication as prescribed.   4. Major depression, chronic Does well with remeron every evening. Take every evening. Refill provided today.  - mirtazapine (REMERON) 15 MG tablet; Take 0.5 tablets (7.5 mg total) by mouth at bedtime.  Dispense: 30 tablet; Refill: 3  5. Acquired hypothyroidism Thyroid panel stable. Continue bp medication as prescribed.   General  Counseling: Brezlyn verbalizes understanding of the findings of todays visit and agrees with plan of treatment. I have discussed any further diagnostic evaluation that may be needed or ordered today. We also reviewed her medications today. she has been encouraged to call the office with any questions or concerns that should arise related to todays visit.    Counseling:  This patient was seen by Leretha Pol, FNP- C in Collaboration with Dr Lavera Guise as a part of collaborative care agreement  Meds ordered this encounter  Medications  . mirtazapine (REMERON) 15 MG tablet    Sig: Take 0.5 tablets (7.5 mg total) by mouth at bedtime.    Dispense:  30 tablet    Refill:  3    Order Specific Question:   Supervising Provider    Answer:   Lavera Guise [[id number]]    Time spent: 65 Minutes     Dr Lavera Guise Internal medicine

## 2018-02-20 ENCOUNTER — Other Ambulatory Visit: Payer: Self-pay

## 2018-02-20 MED ORDER — PANTOPRAZOLE SODIUM 40 MG PO TBEC: 40.0000 mg | DELAYED_RELEASE_TABLET | Freq: Two times a day (BID) | ORAL | 3 refills | Status: DC

## 2018-02-20 MED ORDER — LEVOTHYROXINE SODIUM 50 MCG PO TABS: 50.0000 ug | ORAL_TABLET | Freq: Every day | ORAL | 3 refills | Status: DC

## 2018-02-23 ENCOUNTER — Inpatient Hospital Stay: Payer: Medicare Other

## 2018-02-23 VITALS — BP 127/64 | HR 80 | Temp 98.9°F | Resp 20

## 2018-02-23 DIAGNOSIS — D509 Iron deficiency anemia, unspecified: Secondary | ICD-10-CM | POA: Diagnosis not present

## 2018-02-23 DIAGNOSIS — N182 Chronic kidney disease, stage 2 (mild): Secondary | ICD-10-CM | POA: Diagnosis not present

## 2018-02-23 DIAGNOSIS — D508 Other iron deficiency anemias: Secondary | ICD-10-CM

## 2018-02-23 DIAGNOSIS — D631 Anemia in chronic kidney disease: Secondary | ICD-10-CM | POA: Diagnosis not present

## 2018-02-23 DIAGNOSIS — R531 Weakness: Secondary | ICD-10-CM | POA: Diagnosis not present

## 2018-02-23 DIAGNOSIS — I10 Essential (primary) hypertension: Secondary | ICD-10-CM | POA: Diagnosis not present

## 2018-02-23 DIAGNOSIS — R5383 Other fatigue: Secondary | ICD-10-CM | POA: Diagnosis not present

## 2018-02-23 MED ORDER — SODIUM CHLORIDE 0.9 % IV SOLN
Freq: Once | INTRAVENOUS | Status: AC
  Administered 2018-02-23: 14:00:00 via INTRAVENOUS
  Filled 2018-02-23: qty 1000

## 2018-02-23 MED ORDER — SODIUM CHLORIDE 0.9 % IV SOLN
510.0000 mg | Freq: Once | INTRAVENOUS | Status: AC
  Administered 2018-02-23: 510 mg via INTRAVENOUS
  Filled 2018-02-23: qty 17

## 2018-03-17 DIAGNOSIS — I10 Essential (primary) hypertension: Secondary | ICD-10-CM | POA: Diagnosis not present

## 2018-03-17 DIAGNOSIS — D631 Anemia in chronic kidney disease: Secondary | ICD-10-CM | POA: Diagnosis not present

## 2018-03-17 DIAGNOSIS — N183 Chronic kidney disease, stage 3 (moderate): Secondary | ICD-10-CM | POA: Diagnosis not present

## 2018-03-17 DIAGNOSIS — R809 Proteinuria, unspecified: Secondary | ICD-10-CM | POA: Diagnosis not present

## 2018-03-17 DIAGNOSIS — R808 Other proteinuria: Secondary | ICD-10-CM | POA: Diagnosis not present

## 2018-03-22 ENCOUNTER — Other Ambulatory Visit: Payer: Self-pay

## 2018-03-22 MED ORDER — FUROSEMIDE 20 MG PO TABS: 20.0000 mg | ORAL_TABLET | Freq: Every day | ORAL | 3 refills | Status: DC

## 2018-03-23 ENCOUNTER — Ambulatory Visit (INDEPENDENT_AMBULATORY_CARE_PROVIDER_SITE_OTHER): Payer: Medicare Other | Admitting: Internal Medicine

## 2018-03-23 ENCOUNTER — Encounter: Payer: Self-pay | Admitting: Internal Medicine

## 2018-03-23 VITALS — BP 130/80 | HR 88 | Ht 60.0 in | Wt 137.2 lb

## 2018-03-23 DIAGNOSIS — I2721 Secondary pulmonary arterial hypertension: Secondary | ICD-10-CM | POA: Diagnosis not present

## 2018-03-23 DIAGNOSIS — J9611 Chronic respiratory failure with hypoxia: Secondary | ICD-10-CM | POA: Diagnosis not present

## 2018-03-23 DIAGNOSIS — J449 Chronic obstructive pulmonary disease, unspecified: Secondary | ICD-10-CM | POA: Diagnosis not present

## 2018-03-23 DIAGNOSIS — J209 Acute bronchitis, unspecified: Secondary | ICD-10-CM

## 2018-03-23 DIAGNOSIS — J440 Chronic obstructive pulmonary disease with acute lower respiratory infection: Secondary | ICD-10-CM

## 2018-03-23 DIAGNOSIS — J301 Allergic rhinitis due to pollen: Secondary | ICD-10-CM

## 2018-03-23 NOTE — Patient Instructions (Signed)

## 2018-03-23 NOTE — Progress Notes (Signed)
[facility] [place], [place] [address]  Pulmonary Sleep Medicine   Office Visit Note  Patient Name: Melissa Knox DOB: 1950-11-11 MRN [id number]  Date of Service: 03/23/2018  Complaints/HPI: She is doing well overall. She has no SOB on oxygen at this time. She states occasional cough is noted. She is on inhalers and doing well. Patient states she has no admissions to the hospital. No chest pain noted. No fevers or chills  ROS  General: (-) fever, (-) chills, (-) night sweats, (-) weakness Skin: (-) rashes, (-) itching,. Eyes: (-) visual changes, (-) redness, (-) itching. Nose and Sinuses: (-) nasal stuffiness or itchiness, (-) postnasal drip, (-) nosebleeds, (-) sinus trouble. Mouth and Throat: (-) sore throat, (-) hoarseness. Neck: (-) swollen glands, (-) enlarged thyroid, (-) neck pain. Respiratory: + cough, (-) bloody sputum, + shortness of breath, - wheezing. Cardiovascular: - ankle swelling, (-) chest pain. Lymphatic: (-) lymph node enlargement. Neurologic: (-) numbness, (-) tingling. Psychiatric: (-) anxiety, (-) depression   Current Medication: Outpatient Encounter Medications as of 03/23/2018  Medication Sig  . albuterol (PROVENTIL) (2.5 MG/3ML) 0.083% nebulizer solution Take 2.5 mg by nebulization every 6 (six) hours as needed for wheezing or shortness of breath.  Marland Kitchen amLODipine (NORVASC) 10 MG tablet Take 10 mg by mouth daily.  Marland Kitchen aspirin EC 81 MG tablet Take by mouth.  Marland Kitchen atorvastatin (LIPITOR) 10 MG tablet Take 1 tablet (10 mg total) by mouth daily at 6 PM.  . budesonide-formoterol (SYMBICORT) 160-4.5 MCG/ACT inhaler Inhale 2 puffs into the lungs 2 (two) times daily.  . carvedilol (COREG) 6.25 MG tablet Take 6.25 mg by mouth 2 (two) times daily.  . feeding supplement, ENSURE ENLIVE, (ENSURE ENLIVE) LIQD Take 237 mLs by mouth 3 (three) times daily between meals.  . furosemide (LASIX) 20 MG tablet Take 1 tablet (20 mg total) by mouth daily.  .  hydrALAZINE (APRESOLINE) 50 MG tablet Take 50 mg by mouth 2 (two) times daily.   Marland Kitchen levothyroxine (SYNTHROID, LEVOTHROID) 50 MCG tablet Take 1 tablet (50 mcg total) by mouth daily before breakfast.  . loratadine (CLARITIN) 10 MG tablet Take 1 tablet (10 mg total) by mouth daily.  Marland Kitchen losartan (COZAAR) 100 MG tablet Take 100 mg by mouth daily.  . meclizine (ANTIVERT) 12.5 MG tablet Take 12.5 mg by mouth daily.   . mirtazapine (REMERON) 15 MG tablet Take 0.5 tablets (7.5 mg total) by mouth at bedtime.  . pantoprazole (PROTONIX) 40 MG tablet Take 1 tablet (40 mg total) by mouth 2 (two) times daily.  . sucralfate (CARAFATE) 1 g tablet Take 1 tablet (1 g total) by mouth 4 (four) times daily -  with meals and at bedtime.   No facility-administered encounter medications on file as of 03/23/2018.     Surgical History: Past Surgical History:  Procedure Laterality Date  . GALLBLADDER SURGERY  2012    Medical History: Past Medical History:  Diagnosis Date  . Atherosclerotic heart disease 06/12/2015  . CHF (congestive heart failure) ([place])   . CKD (chronic kidney disease) stage 2, GFR 60-89 ml/min 06/12/2015  . COPD (chronic obstructive pulmonary disease) ([place])   . Hypercalcemia 06/12/2015  . Hyperlipidemia 06/12/2015  . Hypertension 06/12/2015  . Hypothyroidism 06/12/2015  . Insomnia 06/12/2015  . Pulmonary heart disease ([place]) 06/12/2015  . Shortness of breath 06/12/2015  . Solitary pulmonary nodule 06/12/2015    Family History: Family History  Problem Relation Age of Onset  . Cancer Mother   .  Hypertension Father   . Diabetes Father   . Cancer Father     Social History: Social History   Socioeconomic History  . Marital status: Married    Spouse name: Daryl  . Number of children: 3  . Years of education: 41  . Highest education level: 12th grade  Occupational History  . Not on file  Social Needs  . Financial resource strain: Not hard at all  . Food insecurity:    Worry:  Never true    Inability: Never true  . Transportation needs:    Medical: No    Non-medical: No  Tobacco Use  . Smoking status: Former Smoker    Last attempt to quit: 2013    Years since quitting: 6.5  . Smokeless tobacco: Never Used  Substance and Sexual Activity  . Alcohol use: No    Alcohol/week: 0.0 oz  . Drug use: No  . Sexual activity: Yes    Birth control/protection: None  Lifestyle  . Physical activity:    Days per week: 2 days    Minutes per session: 30 min  . Stress: Not at all  Relationships  . Social connections:    Talks on phone: Twice a week    Gets together: Once a week    Attends religious service: 1 to 4 times per year    Active member of club or organization: No    Attends meetings of clubs or organizations: Never    Relationship status: Married  . Intimate partner violence:    Fear of current or ex partner: No    Emotionally abused: No    Physically abused: No    Forced sexual activity: No  Other Topics Concern  . Not on file  Social History Narrative  . Not on file    Vital Signs: Blood pressure 130/80, pulse 88, height 5' (1.524 m), weight 137 lb 3.2 oz (62.2 kg), SpO2 94 %.  Examination: General Appearance: The patient is well-developed, well-nourished, and in no distress. Skin: Gross inspection of skin unremarkable. Head: normocephalic, no gross deformities. Eyes: no gross deformities noted. ENT: ears appear grossly normal no exudates. Neck: Supple. No thyromegaly. No LAD. Respiratory: no rhonchi noted. Cardiovascular: Normal S1 and S2 without murmur or rub. Extremities: No cyanosis. pulses are equal. Neurologic: Alert and oriented. No involuntary movements.  LABS: Recent Results (from the past 2160 hour(s))  Iron and TIBC     Status: None   Collection Time: 02/16/18  2:17 PM  Result Value Ref Range   Iron 35 28 - 170 ug/dL   TIBC 305 250 - 450 ug/dL   Saturation Ratios 12 10.4 - 31.8 %   UIBC 270 ug/dL    Comment: Performed at  [facility], [address] [place], [place] [address]  Ferritin     Status: None   Collection Time: 02/16/18  2:17 PM  Result Value Ref Range   Ferritin 39 11 - 307 ng/mL    Comment: Performed at [facility], [address] [place], [place] [address]  CBC with Differential/Platelet     Status: Abnormal   Collection Time: 02/16/18  2:17 PM  Result Value Ref Range   WBC 8.5 3.6 - 11.0 K/uL   RBC 2.63 (L) 3.80 - 5.20 MIL/uL   Hemoglobin 7.6 (L) 12.0 - 16.0 g/dL   HCT 23.2 (L) 35.0 - 47.0 %   MCV 88.3 80.0 - 100.0 fL   MCH 28.8 26.0 - 34.0 pg  MCHC 32.7 32.0 - 36.0 g/dL   RDW 17.5 (H) 11.5 - 14.5 %   Platelets 560 (H) 150 - 440 K/uL   Neutrophils Relative % 68 %   Neutro Abs 5.8 1.4 - 6.5 K/uL   Lymphocytes Relative 20 %   Lymphs Abs 1.7 1.0 - 3.6 K/uL   Monocytes Relative 6 %   Monocytes Absolute 0.5 0.2 - 0.9 K/uL   Eosinophils Relative 4 %   Eosinophils Absolute 0.3 0 - 0.7 K/uL   Basophils Relative 2 %   Basophils Absolute 0.1 0 - 0.1 K/uL    Comment: Performed at [facility], [address], [place], [place] [address]    Radiology: Ct Head Wo Contrast  Result Date: 05/28/2017 CLINICAL DATA:  Weakness x1 week EXAM: CT HEAD WITHOUT CONTRAST TECHNIQUE: Contiguous axial images were obtained from the base of the skull through the vertex without intravenous contrast. COMPARISON:  None. FINDINGS: Brain: Chronic appearing small vessel ischemic disease of periventricular white matter and the centrum semiovale. Remote appearing right caudate head lacunar infarct. No hydrocephalus. Midline basal cisterns and fourth ventricle without effacement. No large vascular territory infarct. No acute intracranial mass, hemorrhage or midline shift. No extra-axial fluid collections. Vascular: Moderate atherosclerosis of the carotid siphons. No hyperdense vessels. Skull: Negative for fracture or focal lesion. Sinuses/Orbits: No acute finding. Other: None. IMPRESSION: 1.  Chronic appearing small vessel ischemic disease. Chronic right caudate head lacunar infarct. 2. No acute intracranial abnormality nor suspicious osseous findings. Electronically Signed   By: [name] Royalty M.D.   On: 05/28/2017 18:17   Ct Angio Chest Pe W And/or Wo Contrast  Result Date: 05/28/2017 CLINICAL DATA:  Hypoxia.  Weakness.  Pulmonary embolism suspected. EXAM: CT ANGIOGRAPHY CHEST WITH CONTRAST TECHNIQUE: Multidetector CT imaging of the chest was performed using the standard protocol during bolus administration of intravenous contrast. Multiplanar CT image reconstructions and MIPs were obtained to evaluate the vascular anatomy. CONTRAST:  60 cc Isovue 370 IV. COMPARISON:  Chest radiograph from earlier today. 08/13/2014 chest CT. FINDINGS: Cardiovascular: The study is high quality for the evaluation of pulmonary embolism. There are no filling defects in the central, lobar, segmental or subsegmental pulmonary artery branches to suggest acute pulmonary embolism. Atherosclerotic nonaneurysmal thoracic aorta. Main pulmonary artery diameter 3.0 cm, top-normal. Borderline mild cardiomegaly. No significant pericardial fluid/thickening. Left anterior descending and right coronary atherosclerosis. Mediastinum/Nodes: No discrete thyroid nodules. Unremarkable esophagus. No axillary adenopathy. Newly mildly enlarged 1.3 cm right paratracheal node (series 4/ image 36). Newly mildly enlarged 1.0 cm subcarinal node (series 4/ image 46). Newly mildly enlarged 1.1 cm right hilar node (series 4/ image 44). Newly mildly enlarged 1.0 cm left hilar node (series 4/ image 47). Lungs/Pleura: No pneumothorax. No pleural effusion. No acute consolidative airspace disease. Severe centrilobular and paraseptal emphysema throughout both lungs with bullous emphysema at the lung apices and lung bases bilaterally. New ground-glass attenuation and prominent interlobular septal thickening throughout both lungs. Vague subsolid opacity in the  superior segment right lower lobe (series 6/ image 36), cannot exclude underlying pulmonary nodule in this location. Upper abdomen: Stable scarring in the upper right kidney. No acute abnormality in the visualized upper abdomen. Musculoskeletal: No aggressive appearing focal osseous lesions. Mild thoracic spondylosis. Review of the MIP images confirms the above findings. IMPRESSION: 1. No pulmonary embolism . 2. Severe emphysema, including bullous emphysema at the lung apices and lung bases. 3. Spectrum of findings suggestive of congestive heart failure. Borderline mild cardiomegaly. New ground-glass attenuation and  interlobular septal thickening throughout both lungs, suggesting pulmonary edema. 4. Vague sub solid opacity in the superior segment right lower lobe, cannot exclude underlying pulmonary nodule in this location. Recommend follow-up chest CT in 3 months. 5. New mild mediastinal and bilateral hilar adenopathy, nonspecific, probably reactive/congestive. These nodes can also be reassessed on follow-up chest CT in 3 months. 6. Two vessel coronary atherosclerosis. Aortic Atherosclerosis (ICD10-I70.0) and Emphysema (ICD10-J43.9). Electronically Signed   By: Ilona Sorrel M.D.   On: 05/28/2017 20:06   Dg Chest Portable 1 View  Result Date: 05/28/2017 CLINICAL DATA:  Hypoxia. EXAM: PORTABLE CHEST 1 VIEW COMPARISON:  10/03/2012 FINDINGS: The heart size appears normal. Aortic atherosclerosis noted. There is no pleural effusion identified. Advanced changes of centrilobular and paraseptal emphysema again identified. Airspace opacity within the right mid lung is identified which may reflect superimposed pneumonia. IMPRESSION: 1. Right midlung opacity which may represent pneumonia. Followup PA and lateral chest X-ray is recommended in 3-4 weeks following trial of antibiotic therapy to ensure resolution and exclude underlying malignancy. 2. Aortic Atherosclerosis (ICD10-I70.0) and Emphysema (ICD10-J43.9).  Electronically Signed   By: Kerby Moors M.D.   On: 05/28/2017 17:51    No results found.  No results found.    Assessment and Plan: Patient Active Problem List   Diagnosis Date Noted  . Obstructive chronic bronchitis without exacerbation ([place]) 02/17/2018  . Major depression, chronic 02/17/2018  . On supplemental oxygen therapy 01/21/2018  . Acute on chronic respiratory failure with hypoxia ([place]) 01/17/2018  . Pollen allergies 01/16/2018  . CHF (congestive heart failure) ([place]) 05/28/2017  . Syncope 02/06/2017  . Pancreatic mass 02/06/2017  . Iron deficiency anemia 05/01/2016  . Anemia in chronic renal disease 04/26/2016  . Insomnia 06/12/2015  . CKD (chronic kidney disease) stage 2, GFR 60-89 ml/min 06/12/2015  . Hypercalcemia 06/12/2015  . Hypothyroidism 06/12/2015  . 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 Hyperlipidemia 06/12/2015  . Hypertension 06/12/2015  . Atherosclerotic heart disease 06/12/2015  . Solitary pulmonary nodule 06/12/2015  . COPD (chronic obstructive pulmonary disease) ([place]) 06/12/2015    1. COPD doing well with oxygen therapy and her inhalers she will continyue 2. PAH no worsening noted. She is on oxygen therapy 3. Allergic Rhinitis at baseline 4. Chronic respiratroy failure with hypoxia on oxygen as prescribed  General Counseling: I have discussed the findings of the evaluation and examination with Melissa Knox.  I have also discussed any further diagnostic evaluation thatmay be needed or ordered today. Melissa Knox verbalizes understanding of the findings of todays visit. We also reviewed her medications today and discussed drug interactions and side effects including but not limited excessive drowsiness and altered mental states. We also discussed that there is always a risk not just to her but also people around her. she has been encouraged to call the office with any questions or concerns that should arise related to todays visit.    Time spent:  26min  I have personally obtained a history, examined the patient, evaluated laboratory and imaging results, formulated the assessment and plan and placed orders.    Allyne Gee, MD [facility] Pulmonary and Critical Care Sleep medicine

## 2018-04-10 ENCOUNTER — Ambulatory Visit: Payer: Medicare Other | Admitting: Family

## 2018-04-10 ENCOUNTER — Telehealth: Payer: Self-pay | Admitting: Family

## 2018-04-10 NOTE — Progress Notes (Deleted)
[facility]  Telephone:(336) [phone] Fax:(336) [phone]  ID: Melissa Knox OB: 1950-10-15  MR#: [id number]  DZH#:[id number]  Patient Care Team: Ronnell Freshwater, NP as PCP - General (Family Medicine) Clent Jacks, RN as Registered Nurse  CHIEF COMPLAINT: Anemia in chronic renal disease, iron deficiency anemia.  INTERVAL HISTORY: Patient returns to clinic today for repeat laboratory work and further evaluation.  She was recently in the hospital for pneumonia.  She continues to have chronic weakness and fatigue which has been worse over the past several weeks.  She otherwise feels well.  She has no neurologic complaints.  She denies any chest pain or shortness of breath. She denies any nausea, vomiting, constipation, or diarrhea. She has no melena or hematochezia. She has no urinary complaints.  Patient offers no further specific complaints today.  REVIEW OF SYSTEMS:   Review of Systems  Constitutional: Positive for malaise/fatigue. Negative for fever and weight loss.  Respiratory: Negative.  Negative for cough, hemoptysis and shortness of breath.   Cardiovascular: Negative.  Negative for chest pain and leg swelling.  Gastrointestinal: Negative.  Negative for abdominal pain, blood in stool and melena.  Genitourinary: Negative.  Negative for dysuria.  Musculoskeletal: Negative.  Negative for back pain.  Skin: Negative.  Negative for rash.  Neurological: Positive for weakness. Negative for sensory change and focal weakness.  Endo/Heme/Allergies: Does not bruise/bleed easily.  Psychiatric/Behavioral: Negative.  The patient is not nervous/anxious.     As per HPI. Otherwise, a complete review of systems is negative.  PAST MEDICAL HISTORY: Past Medical History:  Diagnosis Date  . Atherosclerotic heart disease 06/12/2015  . CHF (congestive heart failure) ([place])   . CKD (chronic kidney disease) stage 2, GFR 60-89 ml/min 06/12/2015  . COPD (chronic obstructive  pulmonary disease) ([place])   . Hypercalcemia 06/12/2015  . Hyperlipidemia 06/12/2015  . Hypertension 06/12/2015  . Hypothyroidism 06/12/2015  . Insomnia 06/12/2015  . 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 Shortness of breath 06/12/2015  . Solitary pulmonary nodule 06/12/2015    PAST SURGICAL HISTORY: Past Surgical History:  Procedure Laterality Date  . GALLBLADDER SURGERY  2012    FAMILY HISTORY: Family History  Problem Relation Age of Onset  . Cancer Mother   . Hypertension Father   . Diabetes Father   . Cancer Father        ADVANCED DIRECTIVES (Y/N):  N   HEALTH MAINTENANCE: Social History   Tobacco Use  . Smoking status: Former Smoker    Last attempt to quit: 2013    Years since quitting: 6.6  . Smokeless tobacco: Never Used  Substance Use Topics  . Alcohol use: No    Alcohol/week: 0.0 standard drinks  . Drug use: No     Colonoscopy:  PAP:  Bone density:  Lipid panel:  No Known Allergies  Current Outpatient Medications  Medication Sig Dispense Refill  . albuterol (PROVENTIL) (2.5 MG/3ML) 0.083% nebulizer solution Take 2.5 mg by nebulization every 6 (six) hours as needed for wheezing or shortness of breath.    Marland Kitchen amLODipine (NORVASC) 10 MG tablet Take 10 mg by mouth daily.  12  . aspirin EC 81 MG tablet Take by mouth.    Marland Kitchen atorvastatin (LIPITOR) 10 MG tablet Take 1 tablet (10 mg total) by mouth daily at 6 PM. 30 tablet 3  . budesonide-formoterol (SYMBICORT) 160-4.5 MCG/ACT inhaler Inhale 2 puffs into the lungs 2 (two) times daily.    . carvedilol (COREG) 6.25 MG tablet Take 6.25  mg by mouth 2 (two) times daily.  6  . feeding supplement, ENSURE ENLIVE, (ENSURE ENLIVE) LIQD Take 237 mLs by mouth 3 (three) times daily between meals. 237 mL 12  . furosemide (LASIX) 20 MG tablet Take 1 tablet (20 mg total) by mouth daily. 30 tablet 3  . hydrALAZINE (APRESOLINE) 50 MG tablet Take 50 mg by mouth 2 (two) times daily.   6  . levothyroxine (SYNTHROID,  LEVOTHROID) 50 MCG tablet Take 1 tablet (50 mcg total) by mouth daily before breakfast. 90 tablet 3  . loratadine (CLARITIN) 10 MG tablet Take 1 tablet (10 mg total) by mouth daily. 30 tablet 11  . losartan (COZAAR) 100 MG tablet Take 100 mg by mouth daily.  3  . meclizine (ANTIVERT) 12.5 MG tablet Take 12.5 mg by mouth daily.     . mirtazapine (REMERON) 15 MG tablet Take 0.5 tablets (7.5 mg total) by mouth at bedtime. 30 tablet 3  . pantoprazole (PROTONIX) 40 MG tablet Take 1 tablet (40 mg total) by mouth 2 (two) times daily. 60 tablet 3  . sucralfate (CARAFATE) 1 g tablet Take 1 tablet (1 g total) by mouth 4 (four) times daily -  with meals and at bedtime. 120 tablet 3   No current facility-administered medications for this visit.     OBJECTIVE: There were no vitals filed for this visit.   There is no height or weight on file to calculate BMI.    ECOG FS:0 - Asymptomatic  General: Well-developed, well-nourished, no acute distress. Eyes: Pink conjunctiva, anicteric sclera. Lungs: Clear to auscultation bilaterally. Heart: Regular rate and rhythm. No rubs, murmurs, or gallops. Abdomen: Soft, nontender, nondistended. No organomegaly noted, normoactive bowel sounds. Musculoskeletal: No edema, cyanosis, or clubbing. Neuro: Alert, answering all questions appropriately. Cranial nerves grossly intact. Skin: No rashes or petechiae noted. Psych: Normal affect.  LAB RESULTS:  Lab Results  Component Value Date   NA 138 05/30/2017   K 3.8 05/30/2017   CL 103 05/30/2017   CO2 27 05/30/2017   GLUCOSE 143 (H) 05/30/2017   BUN 35 (H) 05/30/2017   CREATININE 1.82 (H) 05/30/2017   CALCIUM 9.6 05/30/2017   PROT 6.9 02/07/2017   ALBUMIN 2.9 (L) 02/07/2017   AST 18 02/07/2017   ALT 11 (L) 02/07/2017   ALKPHOS 50 02/07/2017   BILITOT 0.6 02/07/2017   GFRNONAA 28 (L) 05/30/2017   GFRAA 32 (L) 05/30/2017    Lab Results  Component Value Date   WBC 8.5 02/16/2018   NEUTROABS 5.8 02/16/2018    HGB 7.6 (L) 02/16/2018   HCT 23.2 (L) 02/16/2018   MCV 88.3 02/16/2018   PLT 560 (H) 02/16/2018   Lab Results  Component Value Date   IRON 35 02/16/2018   TIBC 305 02/16/2018   IRONPCTSAT 12 02/16/2018   Lab Results  Component Value Date   FERRITIN 39 02/16/2018     STUDIES: No results found.  ASSESSMENT: Anemia in chronic renal disease, Iron deficiency anemia.  PLAN:    1. Iron deficiency anemia: Patient's hemoglobin has significantly decreased and she is symptomatic.  Iron stores are borderline low.  Previously, the remainder of her laboratory work was either negative or within normal limits.  Proceed with 510 mg IV Feraheme today.  Patient will return to clinic in 1 week for second infusion.  She was then return to clinic in 2 months with repeat laboratory can further evaluation. 2. Anemia in chronic renal disease: Patient will likely benefit from Procrit in  the future and will consider treatment once her iron stores are adequately replaced.   3. Chronic renal insufficiency: Patient's creatinine is elevated, but approximately at her baseline.  Continued evaluation and treatment per nephrology. 4. Hypertension: Patient blood pressure remains significantly elevated.  Continue monitoring and treatment per primary care.   Patient expressed understanding and was in agreement with this plan. She also understands that She can call clinic at any time with any questions, concerns, or complaints.    Lloyd Huger, MD 04/10/18 12:12 AM

## 2018-04-10 NOTE — Telephone Encounter (Signed)
Patient did not show for her Heart Failure Clinic appointment on 12/09/17. Will attempt to reschedule.

## 2018-04-11 ENCOUNTER — Encounter: Payer: Self-pay | Admitting: *Deleted

## 2018-04-11 ENCOUNTER — Emergency Department: Payer: Medicare Other

## 2018-04-11 ENCOUNTER — Other Ambulatory Visit: Payer: Self-pay

## 2018-04-11 ENCOUNTER — Inpatient Hospital Stay
Admission: EM | Admit: 2018-04-11 | Discharge: 2018-04-14 | DRG: 196 | Disposition: A | Discharge status: Home / Self Care | Payer: Medicare Other | Attending: Internal Medicine | Admitting: Internal Medicine

## 2018-04-11 DIAGNOSIS — K219 Gastro-esophageal reflux disease without esophagitis: Secondary | ICD-10-CM | POA: Diagnosis present

## 2018-04-11 DIAGNOSIS — R042 Hemoptysis: Secondary | ICD-10-CM | POA: Diagnosis present

## 2018-04-11 DIAGNOSIS — E876 Hypokalemia: Secondary | ICD-10-CM | POA: Diagnosis present

## 2018-04-11 DIAGNOSIS — J441 Chronic obstructive pulmonary disease with (acute) exacerbation: Secondary | ICD-10-CM | POA: Diagnosis present

## 2018-04-11 DIAGNOSIS — Z7951 Long term (current) use of inhaled steroids: Secondary | ICD-10-CM | POA: Diagnosis not present

## 2018-04-11 DIAGNOSIS — J209 Acute bronchitis, unspecified: Secondary | ICD-10-CM | POA: Diagnosis present

## 2018-04-11 DIAGNOSIS — Z79899 Other long term (current) drug therapy: Secondary | ICD-10-CM | POA: Diagnosis not present

## 2018-04-11 DIAGNOSIS — E785 Hyperlipidemia, unspecified: Secondary | ICD-10-CM | POA: Diagnosis present

## 2018-04-11 DIAGNOSIS — I130 Hypertensive heart and chronic kidney disease with heart failure and stage 1 through stage 4 chronic kidney disease, or unspecified chronic kidney disease: Secondary | ICD-10-CM | POA: Diagnosis present

## 2018-04-11 DIAGNOSIS — N183 Chronic kidney disease, stage 3 (moderate): Secondary | ICD-10-CM | POA: Diagnosis present

## 2018-04-11 DIAGNOSIS — G4700 Insomnia, unspecified: Secondary | ICD-10-CM | POA: Diagnosis present

## 2018-04-11 DIAGNOSIS — J440 Chronic obstructive pulmonary disease with acute lower respiratory infection: Secondary | ICD-10-CM | POA: Diagnosis present

## 2018-04-11 DIAGNOSIS — Z8249 Family history of ischemic heart disease and other diseases of the circulatory system: Secondary | ICD-10-CM

## 2018-04-11 DIAGNOSIS — J9621 Acute and chronic respiratory failure with hypoxia: Secondary | ICD-10-CM | POA: Diagnosis present

## 2018-04-11 DIAGNOSIS — Z7982 Long term (current) use of aspirin: Secondary | ICD-10-CM

## 2018-04-11 DIAGNOSIS — Z9981 Dependence on supplemental oxygen: Secondary | ICD-10-CM | POA: Diagnosis not present

## 2018-04-11 DIAGNOSIS — J849 Interstitial pulmonary disease, unspecified: Secondary | ICD-10-CM

## 2018-04-11 DIAGNOSIS — J8489 Other specified interstitial pulmonary diseases: Secondary | ICD-10-CM | POA: Diagnosis not present

## 2018-04-11 DIAGNOSIS — Z79890 Hormone replacement therapy (postmenopausal): Secondary | ICD-10-CM

## 2018-04-11 DIAGNOSIS — E039 Hypothyroidism, unspecified: Secondary | ICD-10-CM | POA: Diagnosis present

## 2018-04-11 DIAGNOSIS — I5032 Chronic diastolic (congestive) heart failure: Secondary | ICD-10-CM | POA: Diagnosis present

## 2018-04-11 DIAGNOSIS — I279 Pulmonary heart disease, unspecified: Secondary | ICD-10-CM | POA: Diagnosis present

## 2018-04-11 DIAGNOSIS — R0902 Hypoxemia: Secondary | ICD-10-CM | POA: Diagnosis not present

## 2018-04-11 DIAGNOSIS — Z87891 Personal history of nicotine dependence: Secondary | ICD-10-CM | POA: Diagnosis not present

## 2018-04-11 DIAGNOSIS — R049 Hemorrhage from respiratory passages, unspecified: Secondary | ICD-10-CM | POA: Diagnosis not present

## 2018-04-11 DIAGNOSIS — I10 Essential (primary) hypertension: Secondary | ICD-10-CM | POA: Diagnosis not present

## 2018-04-11 DIAGNOSIS — J9620 Acute and chronic respiratory failure, unspecified whether with hypoxia or hypercapnia: Secondary | ICD-10-CM | POA: Diagnosis present

## 2018-04-11 DIAGNOSIS — R531 Weakness: Secondary | ICD-10-CM | POA: Diagnosis not present

## 2018-04-11 DIAGNOSIS — R918 Other nonspecific abnormal finding of lung field: Secondary | ICD-10-CM | POA: Diagnosis not present

## 2018-04-11 HISTORY — DX: Chronic diastolic (congestive) heart failure: I50.32

## 2018-04-11 HISTORY — DX: Interstitial pulmonary disease, unspecified: J84.9

## 2018-04-11 LAB — CBC WITH DIFFERENTIAL/PLATELET
BASOS ABS: 0.1 10*3/uL (ref 0–0.1)
Basophils Relative: 1 %
EOS ABS: 0.2 10*3/uL (ref 0–0.7)
EOS PCT: 2 %
HCT: 27.2 % — ABNORMAL LOW (ref 35.0–47.0)
Hemoglobin: 9.2 g/dL — ABNORMAL LOW (ref 12.0–16.0)
Lymphocytes Relative: 18 %
Lymphs Abs: 1.8 10*3/uL (ref 1.0–3.6)
MCH: 31.4 pg (ref 26.0–34.0)
MCHC: 33.8 g/dL (ref 32.0–36.0)
MCV: 93.1 fL (ref 80.0–100.0)
Monocytes Absolute: 0.6 10*3/uL (ref 0.2–0.9)
Monocytes Relative: 6 %
Neutro Abs: 7.5 10*3/uL — ABNORMAL HIGH (ref 1.4–6.5)
Neutrophils Relative %: 73 %
PLATELETS: 421 10*3/uL (ref 150–440)
RBC: 2.93 MIL/uL — AB (ref 3.80–5.20)
RDW: 17.6 % — ABNORMAL HIGH (ref 11.5–14.5)
WBC: 10.3 10*3/uL (ref 3.6–11.0)

## 2018-04-11 LAB — URINALYSIS, COMPLETE (UACMP) WITH MICROSCOPIC
Bacteria, UA: NONE SEEN
Bilirubin Urine: NEGATIVE
GLUCOSE, UA: NEGATIVE mg/dL
HGB URINE DIPSTICK: NEGATIVE
Ketones, ur: NEGATIVE mg/dL
NITRITE: NEGATIVE
PH: 7 (ref 5.0–8.0)
Protein, ur: NEGATIVE mg/dL
Specific Gravity, Urine: 1.01 (ref 1.005–1.030)

## 2018-04-11 LAB — BRAIN NATRIURETIC PEPTIDE: B NATRIURETIC PEPTIDE 5: 328 pg/mL — AB (ref 0.0–100.0)

## 2018-04-11 LAB — COMPREHENSIVE METABOLIC PANEL
ALT: 9 U/L (ref 0–44)
AST: 16 U/L (ref 15–41)
Albumin: 3.3 g/dL — ABNORMAL LOW (ref 3.5–5.0)
Alkaline Phosphatase: 65 U/L (ref 38–126)
Anion gap: 8 (ref 5–15)
BILIRUBIN TOTAL: 0.5 mg/dL (ref 0.3–1.2)
BUN: 14 mg/dL (ref 8–23)
CALCIUM: 8.4 mg/dL — AB (ref 8.9–10.3)
CO2: 29 mmol/L (ref 22–32)
CREATININE: 1.36 mg/dL — AB (ref 0.44–1.00)
Chloride: 105 mmol/L (ref 98–111)
GFR calc Af Amer: 46 mL/min — ABNORMAL LOW (ref >60)
GFR, EST NON AFRICAN AMERICAN: 40 mL/min — AB (ref >60)
Glucose, Bld: 101 mg/dL — ABNORMAL HIGH (ref 70–99)
Potassium: 3 mmol/L — ABNORMAL LOW (ref 3.5–5.1)
Sodium: 142 mmol/L (ref 135–145)
TOTAL PROTEIN: 7.8 g/dL (ref 6.5–8.1)

## 2018-04-11 LAB — TYPE AND SCREEN
ABO/RH(D): A POS
Antibody Screen: NEGATIVE

## 2018-04-11 MED ORDER — MOMETASONE FURO-FORMOTEROL FUM 200-5 MCG/ACT IN AERO
2.0000 | INHALATION_SPRAY | Freq: Two times a day (BID) | RESPIRATORY_TRACT | Status: DC
  Administered 2018-04-11 – 2018-04-14 (×6): 2 via RESPIRATORY_TRACT
  Filled 2018-04-11: qty 8.8

## 2018-04-11 MED ORDER — MIRTAZAPINE 15 MG PO TABS
7.5000 mg | ORAL_TABLET | Freq: Every day | ORAL | Status: DC
  Administered 2018-04-11 – 2018-04-13 (×3): 7.5 mg via ORAL
  Filled 2018-04-11 (×3): qty 1

## 2018-04-11 MED ORDER — POTASSIUM CHLORIDE CRYS ER 20 MEQ PO TBCR
20.0000 meq | EXTENDED_RELEASE_TABLET | Freq: Two times a day (BID) | ORAL | Status: DC
  Administered 2018-04-11 – 2018-04-14 (×7): 20 meq via ORAL
  Filled 2018-04-11 (×7): qty 1

## 2018-04-11 MED ORDER — IOPAMIDOL (ISOVUE-370) INJECTION 76%
75.0000 mL | Freq: Once | INTRAVENOUS | Status: AC | PRN
  Administered 2018-04-11: 75 mL via INTRAVENOUS

## 2018-04-11 MED ORDER — FUROSEMIDE 20 MG PO TABS
20.0000 mg | ORAL_TABLET | Freq: Every day | ORAL | Status: DC
  Administered 2018-04-11 – 2018-04-14 (×4): 20 mg via ORAL
  Filled 2018-04-11 (×4): qty 1

## 2018-04-11 MED ORDER — LEVOTHYROXINE SODIUM 50 MCG PO TABS
50.0000 ug | ORAL_TABLET | Freq: Every day | ORAL | Status: DC
  Administered 2018-04-12 – 2018-04-14 (×3): 50 ug via ORAL
  Filled 2018-04-11 (×3): qty 1

## 2018-04-11 MED ORDER — LORATADINE 10 MG PO TABS
10.0000 mg | ORAL_TABLET | Freq: Every day | ORAL | Status: DC
  Administered 2018-04-11 – 2018-04-14 (×4): 10 mg via ORAL
  Filled 2018-04-11 (×4): qty 1

## 2018-04-11 MED ORDER — ENSURE ENLIVE PO LIQD
237.0000 mL | Freq: Three times a day (TID) | ORAL | Status: DC
  Administered 2018-04-11: 237 mL via ORAL

## 2018-04-11 MED ORDER — SUCRALFATE 1 G PO TABS
1.0000 g | ORAL_TABLET | Freq: Three times a day (TID) | ORAL | Status: DC
  Administered 2018-04-11 – 2018-04-14 (×12): 1 g via ORAL
  Filled 2018-04-11 (×12): qty 1

## 2018-04-11 MED ORDER — ASPIRIN EC 81 MG PO TBEC
81.0000 mg | DELAYED_RELEASE_TABLET | Freq: Every day | ORAL | Status: DC
  Administered 2018-04-11 – 2018-04-14 (×4): 81 mg via ORAL
  Filled 2018-04-11 (×4): qty 1

## 2018-04-11 MED ORDER — CARVEDILOL 6.25 MG PO TABS
6.2500 mg | ORAL_TABLET | Freq: Two times a day (BID) | ORAL | Status: DC
  Administered 2018-04-11 – 2018-04-14 (×6): 6.25 mg via ORAL
  Filled 2018-04-11 (×6): qty 1

## 2018-04-11 MED ORDER — BUDESONIDE 0.25 MG/2ML IN SUSP
0.2500 mg | Freq: Two times a day (BID) | RESPIRATORY_TRACT | Status: DC
  Administered 2018-04-11 – 2018-04-12 (×3): 0.25 mg via RESPIRATORY_TRACT
  Filled 2018-04-11 (×3): qty 2

## 2018-04-11 MED ORDER — DEXTROSE 5 % IV SOLN
250.0000 mg | INTRAVENOUS | Status: DC
  Administered 2018-04-11 – 2018-04-12 (×2): 250 mg via INTRAVENOUS
  Filled 2018-04-11 (×2): qty 250

## 2018-04-11 MED ORDER — HEPARIN SODIUM (PORCINE) 5000 UNIT/ML IJ SOLN
5000.0000 [IU] | Freq: Three times a day (TID) | INTRAMUSCULAR | Status: DC
  Administered 2018-04-11 – 2018-04-14 (×9): 5000 [IU] via SUBCUTANEOUS
  Filled 2018-04-11 (×9): qty 1

## 2018-04-11 MED ORDER — ATORVASTATIN CALCIUM 10 MG PO TABS
10.0000 mg | ORAL_TABLET | Freq: Every day | ORAL | Status: DC
  Administered 2018-04-11 – 2018-04-13 (×3): 10 mg via ORAL
  Filled 2018-04-11 (×4): qty 1

## 2018-04-11 MED ORDER — SODIUM CHLORIDE 0.9 % IV SOLN
1.0000 g | INTRAVENOUS | Status: DC
  Administered 2018-04-11 – 2018-04-12 (×2): 1 g via INTRAVENOUS
  Filled 2018-04-11: qty 1
  Filled 2018-04-11: qty 10

## 2018-04-11 MED ORDER — DOCUSATE SODIUM 100 MG PO CAPS: 100.0000 mg | ORAL_CAPSULE | Freq: Two times a day (BID) | ORAL | Status: DC | PRN

## 2018-04-11 MED ORDER — PANTOPRAZOLE SODIUM 40 MG PO TBEC
40.0000 mg | DELAYED_RELEASE_TABLET | Freq: Two times a day (BID) | ORAL | Status: DC
  Administered 2018-04-11 – 2018-04-14 (×7): 40 mg via ORAL
  Filled 2018-04-11 (×7): qty 1

## 2018-04-11 MED ORDER — HYDRALAZINE HCL 50 MG PO TABS
50.0000 mg | ORAL_TABLET | Freq: Three times a day (TID) | ORAL | Status: DC
  Administered 2018-04-11 – 2018-04-14 (×7): 50 mg via ORAL
  Filled 2018-04-11 (×8): qty 1

## 2018-04-11 MED ORDER — MECLIZINE HCL 12.5 MG PO TABS
12.5000 mg | ORAL_TABLET | Freq: Three times a day (TID) | ORAL | Status: DC | PRN
  Filled 2018-04-11: qty 1

## 2018-04-11 MED ORDER — IPRATROPIUM-ALBUTEROL 0.5-2.5 (3) MG/3ML IN SOLN
3.0000 mL | RESPIRATORY_TRACT | Status: DC
  Administered 2018-04-11 – 2018-04-12 (×6): 3 mL via RESPIRATORY_TRACT
  Filled 2018-04-11 (×6): qty 3

## 2018-04-11 MED ORDER — METHYLPREDNISOLONE SODIUM SUCC 125 MG IJ SOLR
60.0000 mg | Freq: Four times a day (QID) | INTRAMUSCULAR | Status: DC
  Administered 2018-04-11 – 2018-04-12 (×5): 60 mg via INTRAVENOUS
  Filled 2018-04-11 (×5): qty 2

## 2018-04-11 NOTE — Progress Notes (Signed)
Chaplain received an OR to complete or update an AD. Patient was eating dinner and asked chaplain to leave AD paperwork for later review. Will call or page if necessary.    04/11/18 1715  Clinical Encounter Type  Visited With Patient  Visit Type Initial

## 2018-04-11 NOTE — H&P (Signed)
[facility] NAME: Melissa Knox    MR#:  [id number]  DATE OF BIRTH:  [birth year]  DATE OF ADMISSION:  04/11/2018  PRIMARY CARE PHYSICIAN: Ronnell Freshwater, NP   REQUESTING/REFERRING PHYSICIAN: Malinda   CHIEF COMPLAINT:   Chief Complaint  Patient presents with  . Shortness of Breath  . Hemoptysis    HISTORY OF PRESENT ILLNESS: Melissa Knox  is a 67 y.o. female with a known history of CHF, chronic chronic kidney disease, COPD, hypercalcemia, hyperlipidemia, hypertension, hypothyroidism, solitary pulmonary nodule, chronic oxygen use at home-since yesterday feeling little bit weak and short of breath.  She also had 1-2 episodes of some blood along with her cough.  Today morning they called EMS and her oxygen saturation was in 40s to 50s, her oxygen concentrator machine at home is also malfunctioning for last few days and so they also called home health agency to replace the machine. In the emergency room she was noted to be hypoxic up to 60s and with supplemental oxygen of 7 to 8 L saturation came up more than 90%. CT scan of the chest with angiogram was done which did not show any pulmonary embolism but on the right lower lobe there was questionable infiltrate or alveolar pattern.   PAST MEDICAL HISTORY:   Past Medical History:  Diagnosis Date  . Atherosclerotic heart disease 06/12/2015  . CHF (congestive heart failure) ([place])   . CKD (chronic kidney disease) stage 2, GFR 60-89 ml/min 06/12/2015  . COPD (chronic obstructive pulmonary disease) ([place])   . Hypercalcemia 06/12/2015  . Hyperlipidemia 06/12/2015  . Hypertension 06/12/2015  . Hypothyroidism 06/12/2015  . Insomnia 06/12/2015  . 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 Shortness of breath 06/12/2015  . Solitary pulmonary nodule 06/12/2015    PAST SURGICAL HISTORY:  Past Surgical History:  Procedure Laterality Date  . GALLBLADDER SURGERY  2012    SOCIAL HISTORY:   Social History   Tobacco Use  . Smoking status: Former Smoker    Last attempt to quit: 2013    Years since quitting: 6.6  . Smokeless tobacco: Never Used  Substance Use Topics  . Alcohol use: No    Alcohol/week: 0.0 standard drinks    FAMILY HISTORY:  Family History  Problem Relation Age of Onset  . Cancer Mother   . Hypertension Father   . Diabetes Father   . Cancer Father     DRUG ALLERGIES: No Known Allergies  REVIEW OF SYSTEMS:   CONSTITUTIONAL: No fever, fatigue or weakness.  EYES: No blurred or double vision.  EARS, NOSE, AND THROAT: No tinnitus or ear pain.  RESPIRATORY: Have cough, shortness of breath, no wheezing , have hemoptysis.  CARDIOVASCULAR: No chest pain, orthopnea, edema.  GASTROINTESTINAL: No nausea, vomiting, diarrhea or abdominal pain.  GENITOURINARY: No dysuria, hematuria.  ENDOCRINE: No polyuria, nocturia,  HEMATOLOGY: No anemia, easy bruising or bleeding SKIN: No rash or lesion. MUSCULOSKELETAL: No joint pain or arthritis.   NEUROLOGIC: No tingling, numbness, weakness.  PSYCHIATRY: No anxiety or depression.   MEDICATIONS AT HOME:  Prior to Admission medications   Medication Sig Start Date End Date Taking? Authorizing Provider  albuterol (PROVENTIL) (2.5 MG/3ML) 0.083% nebulizer solution Take 2.5 mg by nebulization every 6 (six) hours as needed for wheezing or shortness of breath.   Yes [provider]  amLODipine (NORVASC) 10 MG tablet Take 10 mg by mouth daily. 04/21/16  Yes [provider]  aspirin EC 81  MG tablet Take 81 mg by mouth daily.  01/22/18 01/22/19 Yes [provider]  atorvastatin (LIPITOR) 10 MG tablet Take 1 tablet (10 mg total) by mouth daily at 6 PM. 01/12/18  Yes Boscia, Heather E, NP  budesonide-formoterol (SYMBICORT) 160-4.5 MCG/ACT inhaler Inhale 2 puffs into the lungs 2 (two) times daily.   Yes [provider]  carvedilol (COREG) 6.25 MG tablet Take 6.25 mg by mouth 2 (two) times daily.  10/03/17  Yes [provider]  feeding supplement, ENSURE ENLIVE, (ENSURE ENLIVE) LIQD Take 237 mLs by mouth 3 (three) times daily between meals. 02/08/17  Yes Mody, Ulice Bold, MD  furosemide (LASIX) 20 MG tablet Take 1 tablet (20 mg total) by mouth daily. 03/22/18  Yes Boscia, Greer Ee, NP  hydrALAZINE (APRESOLINE) 50 MG tablet Take 50 mg by mouth 3 (three) times daily.  04/10/16  Yes [provider]  levothyroxine (SYNTHROID, LEVOTHROID) 50 MCG tablet Take 1 tablet (50 mcg total) by mouth daily before breakfast. 02/20/18  Yes Boscia, Heather E, NP  loratadine (CLARITIN) 10 MG tablet Take 1 tablet (10 mg total) by mouth daily. 02/06/18  Yes Allyne Gee, MD  losartan (COZAAR) 100 MG tablet Take 100 mg by mouth daily. 02/18/16  Yes [provider]  meclizine (ANTIVERT) 12.5 MG tablet Take 12.5 mg by mouth 3 (three) times daily as needed for dizziness.  06/02/16  Yes [provider]  mirtazapine (REMERON) 15 MG tablet Take 0.5 tablets (7.5 mg total) by mouth at bedtime. 02/17/18  Yes Boscia, Heather E, NP  pantoprazole (PROTONIX) 40 MG tablet Take 1 tablet (40 mg total) by mouth 2 (two) times daily. 02/20/18  Yes Boscia, Greer Ee, NP  sucralfate (CARAFATE) 1 g tablet Take 1 tablet (1 g total) by mouth 4 (four) times daily -  with meals and at bedtime. 01/27/18  Yes Boscia, Heather E, NP      PHYSICAL EXAMINATION:   VITAL SIGNS: Blood pressure (!) 125/52, pulse 89, resp. rate 20, height 5' (1.524 m), weight 65.8 kg, SpO2 92 %.  GENERAL:  67 y.o.-year-old patient lying in the bed with no acute distress.  EYES: Pupils equal, round, reactive to light and accommodation. No scleral icterus. Extraocular muscles intact.  HEENT: Head atraumatic, normocephalic. Oropharynx and nasopharynx clear.  NECK:  Supple, no jugular venous distention. No thyroid enlargement, no tenderness.  LUNGS: Normal breath sounds bilaterally, mild wheezing, some crepitation. No use of accessory muscles of  respiration.  On supplemental oxygen via nasal cannula. CARDIOVASCULAR: S1, S2 normal. No murmurs, rubs, or gallops.  ABDOMEN: Soft, nontender, nondistended. Bowel sounds present. No organomegaly or mass.  EXTREMITIES: No pedal edema, cyanosis, or clubbing.  NEUROLOGIC: Cranial nerves II through XII are intact. Muscle strength 5/5 in all extremities. Sensation intact. Gait not checked.  PSYCHIATRIC: The patient is alert and oriented x 3.  SKIN: No obvious rash, lesion, or ulcer.   LABORATORY PANEL:   CBC Recent Labs  Lab 04/11/18 0751  WBC 10.3  HGB 9.2*  HCT 27.2*  PLT 421  MCV 93.1  MCH 31.4  MCHC 33.8  RDW 17.6*  LYMPHSABS 1.8  MONOABS 0.6  EOSABS 0.2  BASOSABS 0.1   ------------------------------------------------------------------------------------------------------------------  Chemistries  Recent Labs  Lab 04/11/18 0751  NA 142  K 3.0*  CL 105  CO2 29  GLUCOSE 101*  BUN 14  CREATININE 1.36*  CALCIUM 8.4*  AST 16  ALT 9  ALKPHOS 65  BILITOT 0.5   ------------------------------------------------------------------------------------------------------------------ estimated creatinine  clearance is 34.4 mL/min (A) (by C-G formula based on SCr of 1.36 mg/dL (H)). ------------------------------------------------------------------------------------------------------------------ No results for input(s): TSH, T4TOTAL, T3FREE, THYROIDAB in the last 72 hours.  Invalid input(s): FREET3   Coagulation profile No results for input(s): INR, PROTIME in the last 168 hours. ------------------------------------------------------------------------------------------------------------------- No results for input(s): DDIMER in the last 72 hours. -------------------------------------------------------------------------------------------------------------------  Cardiac Enzymes No results for input(s): CKMB, TROPONINI, MYOGLOBIN in the last 168 hours.  Invalid input(s):  CK ------------------------------------------------------------------------------------------------------------------ Invalid input(s): POCBNP  ---------------------------------------------------------------------------------------------------------------  Urinalysis    Component Value Date/Time   COLORURINE YELLOW (A) 04/11/2018 0817   APPEARANCEUR CLEAR (A) 04/11/2018 0817   LABSPEC 1.010 04/11/2018 0817   PHURINE 7.0 04/11/2018 0817   GLUCOSEU NEGATIVE 04/11/2018 0817   HGBUR NEGATIVE 04/11/2018 0817   BILIRUBINUR NEGATIVE 04/11/2018 0817   KETONESUR NEGATIVE 04/11/2018 0817   PROTEINUR NEGATIVE 04/11/2018 0817   NITRITE NEGATIVE 04/11/2018 0817   LEUKOCYTESUR TRACE (A) 04/11/2018 0817     RADIOLOGY: Ct Angio Chest Pe W And/or Wo Contrast  Result Date: 04/11/2018 CLINICAL DATA:  Weakness, hemoptysis EXAM: CT ANGIOGRAPHY CHEST WITH CONTRAST TECHNIQUE: Multidetector CT imaging of the chest was performed using the standard protocol during bolus administration of intravenous contrast. Multiplanar CT image reconstructions and MIPs were obtained to evaluate the vascular anatomy. CONTRAST:  9mL ISOVUE-370 IOPAMIDOL (ISOVUE-370) INJECTION 76% COMPARISON:  CTA chest dated 05/28/2017 FINDINGS: Cardiovascular: Satisfactory opacification the bilateral pulmonary artery to the segmental level. Evaluation is mildly constrained by respiratory motion. No evidence pulmonary embolism. No evidence of thoracic aortic aneurysm or dissection. Atherosclerotic calcifications of the aortic arch. The heart is normal in size.  No pericardial effusion. Mild coronary atherosclerosis of the LAD. Mediastinum/Nodes: Small mediastinal lymph nodes, including a 13 mm subcarinal node and an 8 mm short axis prevascular node, likely reactive. Visualized thyroid is unremarkable. Lungs/Pleura: Moderate centrilobular and paraseptal emphysematous changes. Dominant paraseptal emphysematous changes in the right lower lobe. No  focal consolidation. Mild patchy/ground-glass opacity in the superior segment right lower lobe (series 6/image 40). This appearance is mildly progressive when compared to 2018, and therefore an acute process such as infection/inflammation is considered less likely. As such, neoplasm such as invasive adenocarcinoma should be considered. Otherwise, no suspicious pulmonary nodules. No frank interstitial edema. Trace right pleural effusion.  No pneumothorax. Upper Abdomen: Visualized upper abdomen is grossly unremarkable. Musculoskeletal: Visualized osseous structures are within normal limits. Review of the MIP images confirms the above findings. IMPRESSION: No evidence of pulmonary embolism. Progressive patchy/ground-glass opacity in the superior segment right lower lobe. This appearance was present (to a lesser extent) in 2018, making infection/inflammation less likely. Although unusual, neoplasm such as invasive adenocarcinoma should be considered. Consider PET-CT for further evaluation. Aortic Atherosclerosis (ICD10-I70.0) and Emphysema (ICD10-J43.9). Electronically Signed   By: Julian Hy M.D.   On: 04/11/2018 09:41   Dg Chest Portable 1 View  Result Date: 04/11/2018 CLINICAL DATA:  Increased weakness, decreased responsiveness EXAM: PORTABLE CHEST 1 VIEW COMPARISON:  05/28/2017 FINDINGS: Diffuse coarsened interstitial opacities throughout the lungs, most pronounced in the mid and lower lung zones most compatible with fibrosis. Underlying COPD. Heart is borderline in size. Small right effusions suspected. No acute bony abnormality. IMPRESSION: Diffuse coarsened opacities throughout the lungs, most likely chronic interstitial lung disease/fibrosis. COPD. Electronically Signed   By: Rolm Baptise M.D.   On: 04/11/2018 08:20    EKG: Orders placed or performed during the hospital encounter of 04/11/18  . EKG 12-Lead  . EKG 12-Lead  . ED  EKG  . ED EKG    IMPRESSION AND PLAN:  *Acute on chronic  respiratory failure with hypoxia Secondary to COPD exacerbation due to acute bronchitis  On 2 L oxygen at home, currently requiring 7 to 8 L oxygen, try to taper as she can tolerate. Treat with IV ceftriaxone and azithromycin, IV steroid and inhaled nebulizer steroid. Bronchodilators via nebulizer.  *Chronic diastolic congestive heart failure with elevated right-sided pressure Continue home medications, currently holding losartan because of blood pressures running borderline.  *Hypertension    Continuing Coreg and Lasix but holding losartan and amlodipine because blood pressure is running normal currently.  *Hypothyroidism Continue levothyroxine.  *Hyperlipidemia Continue atorvastatin.  *Hypokalemia Replace potassium .  *Chronic kidney disease stage III Appears stable.  *GERD Continue PPI and sucralfate.   All the records are reviewed and case discussed with ED provider. Management plans discussed with the patient, family and they are in agreement.  CODE STATUS: Full. Code Status History    Date Active Date Inactive Code Status Order ID Comments User Context   05/29/2017 1024 05/30/2017 1819 DNR [id number]  Hillary Bow, MD Inpatient   05/28/2017 2323 05/29/2017 1024 Full Code [id number]  Quintella Baton, MD Inpatient   02/06/2017 1525 02/08/2017 1404 Full Code [id number]  Idelle Crouch, MD ED    Questions for Most Recent Historical Code Status (Order [id number])    Question Answer Comment   In the event of cardiac or respiratory ARREST Do not call a "code blue"    In the event of cardiac or respiratory ARREST Do not perform Intubation, CPR, defibrillation or ACLS    In the event of cardiac or respiratory ARREST Use medication by any route, position, wound care, and other measures to relive pain and suffering. May use oxygen, suction and manual treatment of airway obstruction as needed for comfort.        TOTAL TIME TAKING CARE OF THIS PATIENT: 50 minutes.     Vaughan Basta M.D on 04/11/2018   Between 7am to 6pm - Pager - [phone]  After 6pm go to [email or web address] - password [email or web address] [facility]  Office  [phone]  CC: Primary care physician; Ronnell Freshwater, NP   Note: This dictation was prepared with Dragon dictation along with smaller phrase technology. Any transcriptional errors that result from this process are unintentional.

## 2018-04-11 NOTE — ED Triage Notes (Signed)
PT to ED from home rpeorting increased weakness and decreased responsivness this morning. Pt also reporting that she been coughing up blood since last night. Per EMS the blood is bright red in color and a small amount with no clots noted. Hx of bleeding ulcers 1 year ago that she had been seen by GI specialist.   hx of COPD and oxygen dependent at home. 50% at home when EMS arrived. Pt is 68% on 6L upon arrival to ED and placed on a NRB at 15L, pt currently 100% and able to talk to MD without difficulty.  Pt alert and oriented x 4.   Pt reporting pain in left side of neck this morning that has since resolved.   104 CBG with EMS

## 2018-04-11 NOTE — ED Notes (Signed)
Pt requesting to ambulate to the bedside commode. Pt was able to ambulate without difficulty but became SOB and had noted increased WOB. Oxygen saturation dropped and has been documented. MD made aware.

## 2018-04-11 NOTE — ED Notes (Signed)
Pt currently eating but at 1236 oxygen was decreased to 5L [place]. Pt does not show distress but oxygen saturation decreased. Pt placed back on 8L humidified [place]. Floor updated will contact respiratory.

## 2018-04-11 NOTE — ED Provider Notes (Signed)
[facility] Emergency Department Provider Note   ____________________________________________   First MD Initiated Contact with Patient 04/11/18 [phone]     (approximate)  I have reviewed the triage vital signs and the nursing notes.   HISTORY  Chief Complaint Shortness of Breath and Hemoptysis    HPI Melissa Knox is a 67 y.o. female reports she is been feeling ill for the last few days she was going to go see her doctor a day or 2 ago but felt too sick to go so she did not.  This morning her husband could not wake her up so he called 911.  Patient reports she is been coughing up blood last night and there was some blood on the sheets again this morning.  Blood threatening color bright red in color.  Not a very large amount.  O2 sats were in the 40s when first responders arrived they went up to 99 to 100% on nonrebreather.  On 6 L they go down into the 60s.  Patient reports she is feeling better now.  Patient also also has a history of left-sided tremors and falling this happens every 3 months or so she says.  She is been hospitalized for this in the past.  She has not had hemoptysis in the past.   Past Medical History:  Diagnosis Date  . Atherosclerotic heart disease 06/12/2015  . CHF (congestive heart failure) ([place])   . CKD (chronic kidney disease) stage 2, GFR 60-89 ml/min 06/12/2015  . COPD (chronic obstructive pulmonary disease) ([place])   . Hypercalcemia 06/12/2015  . Hyperlipidemia 06/12/2015  . Hypertension 06/12/2015  . Hypothyroidism 06/12/2015  . Insomnia 06/12/2015  . 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 Shortness of breath 06/12/2015  . Solitary pulmonary nodule 06/12/2015    Patient Active Problem List   Diagnosis Date Noted  . Obstructive chronic bronchitis without exacerbation ([place]) 02/17/2018  . Major depression, chronic 02/17/2018  . On supplemental oxygen therapy 01/21/2018  . Acute on chronic respiratory failure with  hypoxia ([place]) 01/17/2018  . Pollen allergies 01/16/2018  . CHF (congestive heart failure) ([place]) 05/28/2017  . Syncope 02/06/2017  . Pancreatic mass 02/06/2017  . Iron deficiency anemia 05/01/2016  . Anemia in chronic renal disease 04/26/2016  . Insomnia 06/12/2015  . CKD (chronic kidney disease) stage 2, GFR 60-89 ml/min 06/12/2015  . Hypercalcemia 06/12/2015  . Hypothyroidism 06/12/2015  . 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 Hyperlipidemia 06/12/2015  . Hypertension 06/12/2015  . Atherosclerotic heart disease 06/12/2015  . Solitary pulmonary nodule 06/12/2015  . COPD (chronic obstructive pulmonary disease) ([place]) 06/12/2015    Past Surgical History:  Procedure Laterality Date  . GALLBLADDER SURGERY  2012    Prior to Admission medications   Medication Sig Start Date End Date Taking? Authorizing Provider  albuterol (PROVENTIL) (2.5 MG/3ML) 0.083% nebulizer solution Take 2.5 mg by nebulization every 6 (six) hours as needed for wheezing or shortness of breath.    [provider]  amLODipine (NORVASC) 10 MG tablet Take 10 mg by mouth daily. 04/21/16   [provider]  aspirin EC 81 MG tablet Take by mouth. 01/22/18 01/22/19  [provider]  atorvastatin (LIPITOR) 10 MG tablet Take 1 tablet (10 mg total) by mouth daily at 6 PM. 01/12/18   Ronnell Freshwater, NP  budesonide-formoterol (SYMBICORT) 160-4.5 MCG/ACT inhaler Inhale 2 puffs into the lungs 2 (two) times daily.    [provider]  carvedilol (COREG) 6.25 MG tablet Take 6.25  mg by mouth 2 (two) times daily. 10/03/17   [provider]  feeding supplement, ENSURE ENLIVE, (ENSURE ENLIVE) LIQD Take 237 mLs by mouth 3 (three) times daily between meals. 02/08/17   Bettey Costa, MD  furosemide (LASIX) 20 MG tablet Take 1 tablet (20 mg total) by mouth daily. 03/22/18   Ronnell Freshwater, NP  hydrALAZINE (APRESOLINE) 50 MG tablet Take 50 mg by mouth 2 (two) times daily.  04/10/16   [provider]  levothyroxine (SYNTHROID, LEVOTHROID) 50 MCG tablet Take 1 tablet (50 mcg total) by mouth daily before breakfast. 02/20/18   Ronnell Freshwater, NP  loratadine (CLARITIN) 10 MG tablet Take 1 tablet (10 mg total) by mouth daily. 02/06/18   Allyne Gee, MD  losartan (COZAAR) 100 MG tablet Take 100 mg by mouth daily. 02/18/16   [provider]  meclizine (ANTIVERT) 12.5 MG tablet Take 12.5 mg by mouth daily.  06/02/16   [provider]  mirtazapine (REMERON) 15 MG tablet Take 0.5 tablets (7.5 mg total) by mouth at bedtime. 02/17/18   Ronnell Freshwater, NP  pantoprazole (PROTONIX) 40 MG tablet Take 1 tablet (40 mg total) by mouth 2 (two) times daily. 02/20/18   Ronnell Freshwater, NP  sucralfate (CARAFATE) 1 g tablet Take 1 tablet (1 g total) by mouth 4 (four) times daily -  with meals and at bedtime. 01/27/18   Ronnell Freshwater, NP    Allergies Patient has no known allergies.  Family History  Problem Relation Age of Onset  . Cancer Mother   . Hypertension Father   . Diabetes Father   . Cancer Father     Social History Social History   Tobacco Use  . Smoking status: Former Smoker    Last attempt to quit: 2013    Years since quitting: 6.6  . Smokeless tobacco: Never Used  Substance Use Topics  . Alcohol use: No    Alcohol/week: 0.0 standard drinks  . Drug use: No    Review of Systems  Constitutional: No fever/chills Eyes: No visual changes. ENT: No sore throat. Cardiovascular: Denies chest pain. Respiratory:  shortness of breath. Gastrointestinal: No abdominal pain.  No nausea, no vomiting.  No diarrhea.  No constipation. Genitourinary: Negative for dysuria. Musculoskeletal: Negative for back pain. Skin: Negative for rash. Neurological: Negative for headaches, focal weakness   ____________________________________________   PHYSICAL EXAM:  VITAL SIGNS: ED Triage Vitals  Enc Vitals Group     BP 04/11/18 0736 (!) 80/58     Pulse Rate  04/11/18 0736 (!) 105     Resp --      Temp --      Temp src --      SpO2 04/11/18 0736 (!) 79 %     Weight --      Height 04/11/18 0738 5' (1.524 m)     Head Circumference --      Peak Flow --      Pain Score 04/11/18 0737 0     Pain Loc --      Pain Edu? --      Excl. in [place]? --     Constitutional: Alert and oriented. Well appearing and in no acute distress. Eyes: Conjunctivae are normal.  Head: Atraumatic. Nose: No congestion/rhinnorhea. Mouth/Throat: Mucous membranes are moist.  Oropharynx non-erythematous. Neck: No stridor.  Cardiovascular: Normal rate, regular rhythm. Grossly normal heart sounds.  Good peripheral circulation. Respiratory: Normal respiratory effort.  No retractions. Lungs CTAB. Gastrointestinal:  Soft and nontender. No distention. No abdominal bruits. No CVA tenderness. Musculoskeletal: No lower extremity tenderness nor edema.   Neurologic:  Normal speech and language. No gross focal neurologic deficits are appreciated.  Skin:  Skin is warm, dry and intact. No rash noted. Psychiatric: Mood and affect are normal. Speech and behavior are normal.  ____________________________________________   LABS (all labs ordered are listed, but only abnormal results are displayed)  Labs Reviewed  COMPREHENSIVE METABOLIC PANEL - Abnormal; Notable for the following components:      Result Value   Potassium 3.0 (*)    Glucose, Bld 101 (*)    Creatinine, Ser 1.36 (*)    Calcium 8.4 (*)    Albumin 3.3 (*)    GFR calc non Af Amer 40 (*)    GFR calc Af Amer 46 (*)    All other components within normal limits  CBC WITH DIFFERENTIAL/PLATELET - Abnormal; Notable for the following components:   RBC 2.93 (*)    Hemoglobin 9.2 (*)    HCT 27.2 (*)    RDW 17.6 (*)    Neutro Abs 7.5 (*)    All other components within normal limits  BRAIN NATRIURETIC PEPTIDE - Abnormal; Notable for the following components:   B Natriuretic Peptide 328.0 (*)    All other components within  normal limits  URINALYSIS, COMPLETE (UACMP) WITH MICROSCOPIC - Abnormal; Notable for the following components:   Color, Urine YELLOW (*)    APPearance CLEAR (*)    Leukocytes, UA TRACE (*)    All other components within normal limits  TYPE AND SCREEN   ____________________________________________  EKG  EKG read and interpreted by me shows normal sinus rhythm rate of 98 normal axis no acute ST-T wave changes ________________________________________  RADIOLOGY  ED MD interpretation: Chest x-ray read by radiologist coarsened opacities throughout the lungs most likely chronic.  I reviewed the film.  CT shows progressive densities.  Radiology feels that could perhaps be a cancer.  Official radiology report(s): Ct Angio Chest Pe W And/or Wo Contrast  Result Date: 04/11/2018 CLINICAL DATA:  Weakness, hemoptysis EXAM: CT ANGIOGRAPHY CHEST WITH CONTRAST TECHNIQUE: Multidetector CT imaging of the chest was performed using the standard protocol during bolus administration of intravenous contrast. Multiplanar CT image reconstructions and MIPs were obtained to evaluate the vascular anatomy. CONTRAST:  83mL ISOVUE-370 IOPAMIDOL (ISOVUE-370) INJECTION 76% COMPARISON:  CTA chest dated 05/28/2017 FINDINGS: Cardiovascular: Satisfactory opacification the bilateral pulmonary artery to the segmental level. Evaluation is mildly constrained by respiratory motion. No evidence pulmonary embolism. No evidence of thoracic aortic aneurysm or dissection. Atherosclerotic calcifications of the aortic arch. The heart is normal in size.  No pericardial effusion. Mild coronary atherosclerosis of the LAD. Mediastinum/Nodes: Small mediastinal lymph nodes, including a 13 mm subcarinal node and an 8 mm short axis prevascular node, likely reactive. Visualized thyroid is unremarkable. Lungs/Pleura: Moderate centrilobular and paraseptal emphysematous changes. Dominant paraseptal emphysematous changes in the right lower lobe. No focal  consolidation. Mild patchy/ground-glass opacity in the superior segment right lower lobe (series 6/image 40). This appearance is mildly progressive when compared to 2018, and therefore an acute process such as infection/inflammation is considered less likely. As such, neoplasm such as invasive adenocarcinoma should be considered. Otherwise, no suspicious pulmonary nodules. No frank interstitial edema. Trace right pleural effusion.  No pneumothorax. Upper Abdomen: Visualized upper abdomen is grossly unremarkable. Musculoskeletal: Visualized osseous structures are within normal limits. Review of the MIP images confirms the above findings. IMPRESSION: No evidence of  pulmonary embolism. Progressive patchy/ground-glass opacity in the superior segment right lower lobe. This appearance was present (to a lesser extent) in 2018, making infection/inflammation less likely. Although unusual, neoplasm such as invasive adenocarcinoma should be considered. Consider PET-CT for further evaluation. Aortic Atherosclerosis (ICD10-I70.0) and Emphysema (ICD10-J43.9). Electronically Signed   By: Julian Hy M.D.   On: 04/11/2018 09:41   Dg Chest Portable 1 View  Result Date: 04/11/2018 CLINICAL DATA:  Increased weakness, decreased responsiveness EXAM: PORTABLE CHEST 1 VIEW COMPARISON:  05/28/2017 FINDINGS: Diffuse coarsened interstitial opacities throughout the lungs, most pronounced in the mid and lower lung zones most compatible with fibrosis. Underlying COPD. Heart is borderline in size. Small right effusions suspected. No acute bony abnormality. IMPRESSION: Diffuse coarsened opacities throughout the lungs, most likely chronic interstitial lung disease/fibrosis. COPD. Electronically Signed   By: Rolm Baptise M.D.   On: 04/11/2018 08:20    ____________________________________________   PROCEDURES  Procedure(s) performed: We attempted to ambulate the patient she says she felt well but her sats dropped immediately even  on oxygen.  Procedures  Critical Care performed:   ____________________________________________   INITIAL IMPRESSION / ASSESSMENT AND PLAN / ED COURSE Patient is hypoxic with the least bit of exertion.  She has hemoptysis and progressive changes on her CT.  We will have to admit her for evaluation.         ____________________________________________   FINAL CLINICAL IMPRESSION(S) / ED DIAGNOSES  Final diagnoses:  Hypoxia  Hemoptysis     ED Discharge Orders    None       Note:  This document was prepared using Dragon voice recognition software and may include unintentional dictation errors.    Nena Polio, MD 04/11/18 [phone]

## 2018-04-11 NOTE — ED Notes (Signed)
RN called report and was told to verify with respiratory that pt was appropriate for floor. Respiratory confirms RN needs to attempt to turn down oxygen flow to 6L but then pt is appropriate for floor unit. Pt updated.

## 2018-04-11 NOTE — Progress Notes (Signed)
Per respiratory okay for pt to be on 8 liter of oxygen on nasal canula with humidifier on the floor. No need for high flow at this point. Respiratory will come and assess when she comes up to the floor.

## 2018-04-11 NOTE — Progress Notes (Signed)
Family Meeting Note  Advance Directive:no  Today a meeting took place with the Patient and spouse.   The following clinical team members were present during this meeting:MD  The following were discussed:Patient's diagnosis: Chronic respiratory failure with home oxygen use, COPD, CHF, Patient's progosis: Unable to determine and Goals for treatment: Full Code  Patient is currently requiring significantly higher oxygen than her baseline, I have discussed about her wishes for resuscitation and intubation and ventilatory support, she and her husband both agreed they would like to have a short trial of ventilatory support if required, they would like to have full CPR and resuscitative measures and husband would be in charge of making further decisions and any adverse event.  Additional follow-up to be provided: Pulmonary  Time spent during discussion:20 minutes  Vaughan Basta, MD

## 2018-04-12 ENCOUNTER — Encounter: Payer: Self-pay | Admitting: Internal Medicine

## 2018-04-12 DIAGNOSIS — J441 Chronic obstructive pulmonary disease with (acute) exacerbation: Secondary | ICD-10-CM

## 2018-04-12 DIAGNOSIS — J9621 Acute and chronic respiratory failure with hypoxia: Secondary | ICD-10-CM

## 2018-04-12 DIAGNOSIS — R042 Hemoptysis: Secondary | ICD-10-CM

## 2018-04-12 DIAGNOSIS — I5032 Chronic diastolic (congestive) heart failure: Secondary | ICD-10-CM

## 2018-04-12 DIAGNOSIS — J849 Interstitial pulmonary disease, unspecified: Secondary | ICD-10-CM

## 2018-04-12 HISTORY — DX: Interstitial pulmonary disease, unspecified: J84.9

## 2018-04-12 HISTORY — DX: Chronic diastolic (congestive) heart failure: I50.32

## 2018-04-12 LAB — BASIC METABOLIC PANEL
ANION GAP: 7 (ref 5–15)
BUN: 20 mg/dL (ref 8–23)
CHLORIDE: 109 mmol/L (ref 98–111)
CO2: 25 mmol/L (ref 22–32)
Calcium: 8.7 mg/dL — ABNORMAL LOW (ref 8.9–10.3)
Creatinine, Ser: 1.49 mg/dL — ABNORMAL HIGH (ref 0.44–1.00)
GFR calc Af Amer: 41 mL/min — ABNORMAL LOW (ref >60)
GFR, EST NON AFRICAN AMERICAN: 35 mL/min — AB (ref >60)
Glucose, Bld: 168 mg/dL — ABNORMAL HIGH (ref 70–99)
POTASSIUM: 3.7 mmol/L (ref 3.5–5.1)
SODIUM: 141 mmol/L (ref 135–145)

## 2018-04-12 LAB — SEDIMENTATION RATE: Sed Rate: 99 mm/hr — ABNORMAL HIGH (ref 0–30)

## 2018-04-12 LAB — CBC
HCT: 26.1 % — ABNORMAL LOW (ref 35.0–47.0)
HEMOGLOBIN: 8.5 g/dL — AB (ref 12.0–16.0)
MCH: 30.8 pg (ref 26.0–34.0)
MCHC: 32.7 g/dL (ref 32.0–36.0)
MCV: 94.2 fL (ref 80.0–100.0)
Platelets: 394 10*3/uL (ref 150–440)
RBC: 2.77 MIL/uL — AB (ref 3.80–5.20)
RDW: 17.5 % — ABNORMAL HIGH (ref 11.5–14.5)
WBC: 5.5 10*3/uL (ref 3.6–11.0)

## 2018-04-12 MED ORDER — METHYLPREDNISOLONE SODIUM SUCC 40 MG IJ SOLR
40.0000 mg | Freq: Three times a day (TID) | INTRAMUSCULAR | Status: DC
  Administered 2018-04-12 – 2018-04-13 (×2): 40 mg via INTRAVENOUS
  Filled 2018-04-12 (×2): qty 1

## 2018-04-12 MED ORDER — IPRATROPIUM-ALBUTEROL 0.5-2.5 (3) MG/3ML IN SOLN
3.0000 mL | Freq: Four times a day (QID) | RESPIRATORY_TRACT | Status: DC
  Administered 2018-04-12 – 2018-04-14 (×8): 3 mL via RESPIRATORY_TRACT
  Filled 2018-04-12 (×9): qty 3

## 2018-04-12 MED ORDER — AZITHROMYCIN 250 MG PO TABS
250.0000 mg | ORAL_TABLET | Freq: Every day | ORAL | Status: DC
  Administered 2018-04-13 – 2018-04-14 (×2): 250 mg via ORAL
  Filled 2018-04-12 (×2): qty 1

## 2018-04-12 MED ORDER — SODIUM CHLORIDE 0.9 % IV SOLN
1.0000 g | INTRAVENOUS | Status: DC
  Administered 2018-04-13 – 2018-04-14 (×2): 1 g via INTRAVENOUS
  Filled 2018-04-12: qty 10
  Filled 2018-04-12 (×2): qty 1

## 2018-04-12 NOTE — Consult Note (Signed)
Pulmonary Critical Care  Initial Consult Note  Melissa Knox ONG:[id number] DOB: 05/14/1951 DOA: 04/11/2018  Referring physician: Dr Anselm Jungling  Chief Complaint: Shortness of breath  HPI: Melissa Knox is a 67 y.o. female who is known to [place] from the office.  She has a history of COPD CHF chronic renal failure presented to the hospital because of increasing shortness of breath.  In addition she was having some periods of hemoptysis.  Denies having any chest pain there is no palpitations.  The patient was noted to be significantly hypoxic with saturations in the 40 to 50% range.  She was evaluated with a chest x-ray which showed a diffuse interstitial pattern.  CT scan was done which shows groundglass appearance.  She was actually admitted back in 2018 with a similar appearance and at that time she was diagnosed with diastolic heart failure.  She was treated for the diastolic heart failure and actually had improvement.  Since then she followed up in the office and has had no specific complaints.  Back in May she was admitted to [facility] with worsening shortness of breath.  At that time she was diagnosed with bilateral pneumonia right greater than left.  She was treated and a CT scan was done at that time which showed right greater than left bilateral pneumonia.  She was given ceftriaxone and azithromycin and she had clinical improvement.  She was discharged home and was said to have needed a follow-up CT scan in about 3 months.  The concern now is that she has this groundglass opacities and there is concern for neoplastic process.  Today she states that she is feeling better overall her oxygen requirements have actually improved.  She is on baseline 2 L oxygen at home.  Review of Systems:  Constitutional:  No weight loss, night sweats, Fevers, chills, +fatigue.  HEENT:  No headaches, nasal congestion, post nasal drip,  Cardio-vascular:  No chest pain, +Orthopnea, +PND, no swelling in lower  extremities, anasarca, dizziness, palpitations  GI:  No heartburn, indigestion, abdominal pain, nausea, vomiting, diarrhea  Resp:  +shortness of breath. +cough, +wheezing Skin:  no rash or lesions.  Musculoskeletal:  No joint pain or swelling.   Remainder ROS performed and is unremarkable other than noted in HPI  Past Medical History:  Diagnosis Date  . Atherosclerotic heart disease 06/12/2015  . CHF (congestive heart failure) ([place])   . CKD (chronic kidney disease) stage 2, GFR 60-89 ml/min 06/12/2015  . COPD (chronic obstructive pulmonary disease) ([place])   . Hypercalcemia 06/12/2015  . Hyperlipidemia 06/12/2015  . Hypertension 06/12/2015  . Hypothyroidism 06/12/2015  . Insomnia 06/12/2015  . 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 Shortness of breath 06/12/2015  . Solitary pulmonary nodule 06/12/2015   Past Surgical History:  Procedure Laterality Date  . GALLBLADDER SURGERY  2012   Social History:  reports that she quit smoking about 6 years ago. She has never used smokeless tobacco. She reports that she does not drink alcohol or use drugs.  No Known Allergies  Family History  Problem Relation Age of Onset  . Cancer Mother   . Hypertension Father   . Diabetes Father   . Cancer Father     Prior to Admission medications   Medication Sig Start Date End Date Taking? Authorizing Provider  albuterol (PROVENTIL) (2.5 MG/3ML) 0.083% nebulizer solution Take 2.5 mg by nebulization every 6 (six) hours as needed for wheezing or shortness of breath.   Yes [provider]  amLODipine (NORVASC)  10 MG tablet Take 10 mg by mouth daily. 04/21/16  Yes [provider]  aspirin EC 81 MG tablet Take 81 mg by mouth daily.  01/22/18 01/22/19 Yes [provider]  atorvastatin (LIPITOR) 10 MG tablet Take 1 tablet (10 mg total) by mouth daily at 6 PM. 01/12/18  Yes Boscia, Heather E, NP  budesonide-formoterol (SYMBICORT) 160-4.5 MCG/ACT inhaler Inhale 2 puffs into the  lungs 2 (two) times daily.   Yes [provider]  carvedilol (COREG) 6.25 MG tablet Take 6.25 mg by mouth 2 (two) times daily. 10/03/17  Yes [provider]  feeding supplement, ENSURE ENLIVE, (ENSURE ENLIVE) LIQD Take 237 mLs by mouth 3 (three) times daily between meals. 02/08/17  Yes Mody, Ulice Bold, MD  furosemide (LASIX) 20 MG tablet Take 1 tablet (20 mg total) by mouth daily. 03/22/18  Yes Boscia, Greer Ee, NP  hydrALAZINE (APRESOLINE) 50 MG tablet Take 50 mg by mouth 3 (three) times daily.  04/10/16  Yes [provider]  levothyroxine (SYNTHROID, LEVOTHROID) 50 MCG tablet Take 1 tablet (50 mcg total) by mouth daily before breakfast. 02/20/18  Yes Boscia, Heather E, NP  loratadine (CLARITIN) 10 MG tablet Take 1 tablet (10 mg total) by mouth daily. 02/06/18  Yes Allyne Gee, MD  losartan (COZAAR) 100 MG tablet Take 100 mg by mouth daily. 02/18/16  Yes [provider]  meclizine (ANTIVERT) 12.5 MG tablet Take 12.5 mg by mouth 3 (three) times daily as needed for dizziness.  06/02/16  Yes [provider]  mirtazapine (REMERON) 15 MG tablet Take 0.5 tablets (7.5 mg total) by mouth at bedtime. 02/17/18  Yes Boscia, Heather E, NP  pantoprazole (PROTONIX) 40 MG tablet Take 1 tablet (40 mg total) by mouth 2 (two) times daily. 02/20/18  Yes Boscia, Greer Ee, NP  sucralfate (CARAFATE) 1 g tablet Take 1 tablet (1 g total) by mouth 4 (four) times daily -  with meals and at bedtime. 01/27/18  Yes Ronnell Freshwater, NP   Physical Exam: Vitals:   04/12/18 0346 04/12/18 0506 04/12/18 0741 04/12/18 0819  BP:  (!) 179/56 129/61   Pulse:  (!) 108 81   Resp:  (!) 26 20   Temp:  98.5 F (36.9 C) 98.1 F (36.7 C)   TempSrc:  Oral Oral   SpO2: 93% 91% 94% 94%  Weight:      Height:        Wt Readings from Last 3 Encounters:  04/11/18 62.9 kg  03/23/18 62.2 kg  02/17/18 61.6 kg    General:  Appears calm and comfortable Eyes: PERRL, normal lids, irises &  conjunctiva ENT: grossly normal hearing, lips & tongue Neck: no LAD, masses or thyromegaly Cardiovascular: RRR, no m/r/g. No LE edema. Respiratory: Bilateral crackles are noted in the lower lung zones Abdomen: soft, nontender Skin: no rash or induration seen on limited exam Musculoskeletal: grossly normal tone BUE/BLE Psychiatric: grossly normal mood and affect Neurologic: grossly non-focal.          Labs on Admission:  Basic Metabolic Panel: Recent Labs  Lab 04/11/18 0751 04/12/18 0447  NA 142 141  K 3.0* 3.7  CL 105 109  CO2 29 25  GLUCOSE 101* 168*  BUN 14 20  CREATININE 1.36* 1.49*  CALCIUM 8.4* 8.7*   Liver Function Tests: Recent Labs  Lab 04/11/18 0751  AST 16  ALT 9  ALKPHOS 65  BILITOT 0.5  PROT 7.8  ALBUMIN 3.3*   No results for input(s):  LIPASE, AMYLASE in the last 168 hours. No results for input(s): AMMONIA in the last 168 hours. CBC: Recent Labs  Lab 04/11/18 0751 04/12/18 0447  WBC 10.3 5.5  NEUTROABS 7.5*  --   HGB 9.2* 8.5*  HCT 27.2* 26.1*  MCV 93.1 94.2  PLT 421 394   Cardiac Enzymes: No results for input(s): CKTOTAL, CKMB, CKMBINDEX, TROPONINI in the last 168 hours.  BNP (last 3 results) Recent Labs    04/11/18 0751  BNP 328.0*    ProBNP (last 3 results) No results for input(s): PROBNP in the last 8760 hours.  CBG: No results for input(s): GLUCAP in the last 168 hours.  Radiological Exams on Admission: Ct Angio Chest Pe W And/or Wo Contrast  Result Date: 04/11/2018 CLINICAL DATA:  Weakness, hemoptysis EXAM: CT ANGIOGRAPHY CHEST WITH CONTRAST TECHNIQUE: Multidetector CT imaging of the chest was performed using the standard protocol during bolus administration of intravenous contrast. Multiplanar CT image reconstructions and MIPs were obtained to evaluate the vascular anatomy. CONTRAST:  48mL ISOVUE-370 IOPAMIDOL (ISOVUE-370) INJECTION 76% COMPARISON:  CTA chest dated 05/28/2017 FINDINGS: Cardiovascular: Satisfactory  opacification the bilateral pulmonary artery to the segmental level. Evaluation is mildly constrained by respiratory motion. No evidence pulmonary embolism. No evidence of thoracic aortic aneurysm or dissection. Atherosclerotic calcifications of the aortic arch. The heart is normal in size.  No pericardial effusion. Mild coronary atherosclerosis of the LAD. Mediastinum/Nodes: Small mediastinal lymph nodes, including a 13 mm subcarinal node and an 8 mm short axis prevascular node, likely reactive. Visualized thyroid is unremarkable. Lungs/Pleura: Moderate centrilobular and paraseptal emphysematous changes. Dominant paraseptal emphysematous changes in the right lower lobe. No focal consolidation. Mild patchy/ground-glass opacity in the superior segment right lower lobe (series 6/image 40). This appearance is mildly progressive when compared to 2018, and therefore an acute process such as infection/inflammation is considered less likely. As such, neoplasm such as invasive adenocarcinoma should be considered. Otherwise, no suspicious pulmonary nodules. No frank interstitial edema. Trace right pleural effusion.  No pneumothorax. Upper Abdomen: Visualized upper abdomen is grossly unremarkable. Musculoskeletal: Visualized osseous structures are within normal limits. Review of the MIP images confirms the above findings. IMPRESSION: No evidence of pulmonary embolism. Progressive patchy/ground-glass opacity in the superior segment right lower lobe. This appearance was present (to a lesser extent) in 2018, making infection/inflammation less likely. Although unusual, neoplasm such as invasive adenocarcinoma should be considered. Consider PET-CT for further evaluation. Aortic Atherosclerosis (ICD10-I70.0) and Emphysema (ICD10-J43.9). Electronically Signed   By: Julian Hy M.D.   On: 04/11/2018 09:41   Dg Chest Portable 1 View  Result Date: 04/11/2018 CLINICAL DATA:  Increased weakness, decreased responsiveness EXAM:  PORTABLE CHEST 1 VIEW COMPARISON:  05/28/2017 FINDINGS: Diffuse coarsened interstitial opacities throughout the lungs, most pronounced in the mid and lower lung zones most compatible with fibrosis. Underlying COPD. Heart is borderline in size. Small right effusions suspected. No acute bony abnormality. IMPRESSION: Diffuse coarsened opacities throughout the lungs, most likely chronic interstitial lung disease/fibrosis. COPD. Electronically Signed   By: Rolm Baptise M.D.   On: 04/11/2018 08:20    EKG: Independently reviewed.  Assessment/Plan Principal Problem:   Acute on chronic respiratory failure with hypoxia (HCC) Active Problems:   COPD with acute exacerbation (HCC)   Acute on chronic respiratory failure (HCC)   Interstitial pneumonia (HCC)   Chronic congestive heart failure with left ventricular diastolic dysfunction ([place])   1. Bilateral interstitial pneumonitis.  The concern has been raised is whether this could represent a neoplastic process.  Right now she is actually clinically improving.   Reviewing the notes from my [facility] she had a similar admission and was treated for pneumonia with clinical improvement.  At this time I would continue to treat her for acute pneumonitis she is showing some improvement however she is going to need follow-up after discharge.  I think we can have her get a PET scan after she is discharged to make sure that there is no underlying malignancy that needs to be addressed.  She is not a candidate for a bronchoscopy at this time because of the hypoxia however if she does not improve etiologically after discharge then we may need some type of tissue diagnosis.  Bronchoscopy may be high risk because she has severe bullous disease on the CT scan and would be at high risk for pneumothorax.  Would also get ANA ANCA sed rate and anti-GBM antibodies to rule out an inflammatory process.  I have also ordered a Legionella antigen as well as mycoplasma titers.  Continue with the  azithromycin and Rocephin for now there is no improvement would consider broadening the antibiotic coverage 2. COPD with continued continue with current management she does appear to be clinically improving she has been on oxygen therapy and her oxygen requirements are going down 3. acute on chronic respiratory failure she is on oxygen therapy this should be continued 4. Chronic diastolic dysfunctionShe has had diastolic dysfunction the past was treated and had improved.   Code Status: Full Code  Family Communication: no family present  Disposition Plan: home   Time spent: 95min  I have personally obtained a history, examined the patient, evaluated laboratory and imaging results, formulated the assessment and plan and placed orders.  The Patient requires high complexity decision making for assessment and support. Total Time Spent 17min   Saadat A Khan, MD [facility] Pulmonary Critical Care Medicine Sleep Medicine

## 2018-04-12 NOTE — Progress Notes (Signed)
[place] [facility] [place] NAME: Melissa Knox    MR#:  [id number]  DATE OF BIRTH:  10-24-1950  SUBJECTIVE:  CHIEF COMPLAINT: Patient shortness of breath and cough are better than yesterday but reporting dyspnea with minimal exertion  REVIEW OF SYSTEMS:  CONSTITUTIONAL: No fever, fatigue or weakness.  EYES: No blurred or double vision.  EARS, NOSE, AND THROAT: No tinnitus or ear pain.  RESPIRATORY: Somewhat improved cough, shortness of breath, but still having exertional dyspnea, denies wheezing or hemoptysis.  CARDIOVASCULAR: No chest pain, orthopnea, edema.  GASTROINTESTINAL: No nausea, vomiting, diarrhea or abdominal pain.  GENITOURINARY: No dysuria, hematuria.  ENDOCRINE: No polyuria, nocturia,  HEMATOLOGY: No anemia, easy bruising or bleeding SKIN: No rash or lesion. MUSCULOSKELETAL: No joint pain or arthritis.   NEUROLOGIC: No tingling, numbness, weakness.  PSYCHIATRY: No anxiety or depression.   DRUG ALLERGIES:  No Known Allergies  VITALS:  Blood pressure (!) 134/52, pulse 96, temperature 97.7 F (36.5 C), temperature source Oral, resp. rate 16, height 5' (1.524 m), weight 62.9 kg, SpO2 92 %.  PHYSICAL EXAMINATION:  GENERAL:  67 y.o.-year-old patient lying in the bed with no acute distress.  EYES: Pupils equal, round, reactive to light and accommodation. No scleral icterus. Extraocular muscles intact.  HEENT: Head atraumatic, normocephalic. Oropharynx and nasopharynx clear.  NECK:  Supple, no jugular venous distention. No thyroid enlargement, no tenderness.  LUNGS: Moderate breath sounds bilaterally, positive crepitations, no wheezing, rales. No use of accessory muscles of respiration.  CARDIOVASCULAR: S1, S2 normal. No murmurs, rubs, or gallops.  ABDOMEN: Soft, nontender, nondistended. Bowel sounds present. No organomegaly or mass.  EXTREMITIES: No pedal edema, cyanosis, or clubbing.  NEUROLOGIC: Cranial nerves II through  XII are intact.  Sensation intact. Gait not checked.  PSYCHIATRIC: The patient is alert and oriented x 3.  SKIN: No obvious rash, lesion, or ulcer.    LABORATORY PANEL:   CBC Recent Labs  Lab 04/12/18 0447  WBC 5.5  HGB 8.5*  HCT 26.1*  PLT 394   ------------------------------------------------------------------------------------------------------------------  Chemistries  Recent Labs  Lab 04/11/18 0751 04/12/18 0447  NA 142 141  K 3.0* 3.7  CL 105 109  CO2 29 25  GLUCOSE 101* 168*  BUN 14 20  CREATININE 1.36* 1.49*  CALCIUM 8.4* 8.7*  AST 16  --   ALT 9  --   ALKPHOS 65  --   BILITOT 0.5  --    ------------------------------------------------------------------------------------------------------------------  Cardiac Enzymes No results for input(s): TROPONINI in the last 168 hours. ------------------------------------------------------------------------------------------------------------------  RADIOLOGY:  Ct Angio Chest Pe W And/or Wo Contrast  Result Date: 04/11/2018 CLINICAL DATA:  Weakness, hemoptysis EXAM: CT ANGIOGRAPHY CHEST WITH CONTRAST TECHNIQUE: Multidetector CT imaging of the chest was performed using the standard protocol during bolus administration of intravenous contrast. Multiplanar CT image reconstructions and MIPs were obtained to evaluate the vascular anatomy. CONTRAST:  18mL ISOVUE-370 IOPAMIDOL (ISOVUE-370) INJECTION 76% COMPARISON:  CTA chest dated 05/28/2017 FINDINGS: Cardiovascular: Satisfactory opacification the bilateral pulmonary artery to the segmental level. Evaluation is mildly constrained by respiratory motion. No evidence pulmonary embolism. No evidence of thoracic aortic aneurysm or dissection. Atherosclerotic calcifications of the aortic arch. The heart is normal in size.  No pericardial effusion. Mild coronary atherosclerosis of the LAD. Mediastinum/Nodes: Small mediastinal lymph nodes, including a 13 mm subcarinal node and an 8 mm  short axis prevascular node, likely reactive. Visualized thyroid is unremarkable. Lungs/Pleura: Moderate centrilobular and paraseptal emphysematous changes. Dominant paraseptal emphysematous  changes in the right lower lobe. No focal consolidation. Mild patchy/ground-glass opacity in the superior segment right lower lobe (series 6/image 40). This appearance is mildly progressive when compared to 2018, and therefore an acute process such as infection/inflammation is considered less likely. As such, neoplasm such as invasive adenocarcinoma should be considered. Otherwise, no suspicious pulmonary nodules. No frank interstitial edema. Trace right pleural effusion.  No pneumothorax. Upper Abdomen: Visualized upper abdomen is grossly unremarkable. Musculoskeletal: Visualized osseous structures are within normal limits. Review of the MIP images confirms the above findings. IMPRESSION: No evidence of pulmonary embolism. Progressive patchy/ground-glass opacity in the superior segment right lower lobe. This appearance was present (to a lesser extent) in 2018, making infection/inflammation less likely. Although unusual, neoplasm such as invasive adenocarcinoma should be considered. Consider PET-CT for further evaluation. Aortic Atherosclerosis (ICD10-I70.0) and Emphysema (ICD10-J43.9). Electronically Signed   By: Julian Hy M.D.   On: 04/11/2018 09:41   Dg Chest Portable 1 View  Result Date: 04/11/2018 CLINICAL DATA:  Increased weakness, decreased responsiveness EXAM: PORTABLE CHEST 1 VIEW COMPARISON:  05/28/2017 FINDINGS: Diffuse coarsened interstitial opacities throughout the lungs, most pronounced in the mid and lower lung zones most compatible with fibrosis. Underlying COPD. Heart is borderline in size. Small right effusions suspected. No acute bony abnormality. IMPRESSION: Diffuse coarsened opacities throughout the lungs, most likely chronic interstitial lung disease/fibrosis. COPD. Electronically Signed   By:  Rolm Baptise M.D.   On: 04/11/2018 08:20    EKG:   Orders placed or performed during the hospital encounter of 04/11/18  . EKG 12-Lead  . EKG 12-Lead  . ED EKG  . ED EKG    ASSESSMENT AND PLAN:   *Acute on chronic respiratory failure with hypoxia secondary to bilateral interstitial pneumonitis ,  COPD exacerbation due to acute bronchitis Concern for underlying neoplastic process Seen by pulmonology Dr. Humphrey Rolls, patient is not a candidate for bronchoscopy and also patient might be at high risk for pneumothorax given the severe bullous disease on the CT scan -Recommending outpatient PET scan after she is discharged -ANA ANCA sed rate and anti-GBM antibodies to rule out an inflammatory process.   Legionella antigen as well as mycoplasma titers.  - Continue with the azithromycin and Rocephin for now there is no improvement would consider broadening the antibiotic coverage -On 2 L oxygen at home, currently requiring 5 lit oxygen, try to taper as she can tolerate. Bronchodilators via nebulizer.  *Chronic diastolic congestive heart failure with elevated right-sided pressure Continue home medications, currently holding losartan because of blood pressures running borderline.  *Hypertension    Continuing Coreg and Lasix but holding losartan and amlodipine because blood pressure is running normal currently.  *Hypothyroidism Continue levothyroxine.  *Hyperlipidemia Continue atorvastatin.  *Hypokalemia Replace potassium .  *Chronic kidney disease stage III Appears stable. Creatinine 1.36-1.49  *GERD Continue PPI and sucralfate.      All the records are reviewed and case discussed with Care Management/Social Workerr. Management plans discussed with the patient, family and they are in agreement.  CODE STATUS: FC   TOTAL TIME TAKING CARE OF THIS PATIENT: 38 minutes.   POSSIBLE D/C IN 2  DAYS, DEPENDING ON CLINICAL CONDITION.  Note: This dictation was prepared with Dragon  dictation along with smaller phrase technology. Any transcriptional errors that result from this process are unintentional.   Nicholes Mango M.D on 04/12/2018 at 2:33 PM  Between 7am to 6pm - Pager - [phone] After 6pm go to [email or web address] - password [email or web address] [facility]  [facility]  Office  [phone]  CC: Primary care physician; Ronnell Freshwater, NP

## 2018-04-13 ENCOUNTER — Inpatient Hospital Stay: Payer: Medicare Other

## 2018-04-13 ENCOUNTER — Inpatient Hospital Stay: Payer: Medicare Other | Admitting: Oncology

## 2018-04-13 LAB — BASIC METABOLIC PANEL
Anion gap: 6 (ref 5–15)
BUN: 30 mg/dL — AB (ref 8–23)
CHLORIDE: 111 mmol/L (ref 98–111)
CO2: 25 mmol/L (ref 22–32)
CREATININE: 1.56 mg/dL — AB (ref 0.44–1.00)
Calcium: 8.7 mg/dL — ABNORMAL LOW (ref 8.9–10.3)
GFR, EST AFRICAN AMERICAN: 39 mL/min — AB (ref >60)
GFR, EST NON AFRICAN AMERICAN: 34 mL/min — AB (ref >60)
Glucose, Bld: 127 mg/dL — ABNORMAL HIGH (ref 70–99)
POTASSIUM: 3.9 mmol/L (ref 3.5–5.1)
Sodium: 142 mmol/L (ref 135–145)

## 2018-04-13 LAB — ENA+DNA/DS+SJORGEN'S
DS DNA AB: 1 [IU]/mL (ref 0–9)
ENA SM Ab Ser-aCnc: <0.2 AI (ref 0.0–0.9)
RIBONUCLEIC PROTEIN: 0.3 AI (ref 0.0–0.9)

## 2018-04-13 LAB — MPO/PR-3 (ANCA) ANTIBODIES
ANCA PROTEINASE 3: 3.6 U/mL — AB (ref 0.0–3.5)
MYELOPEROXIDASE ABS: 22.6 U/mL — AB (ref 0.0–9.0)

## 2018-04-13 LAB — GLOMERULAR BASEMENT MEMBRANE ANTIBODIES: GBM Ab: 4 units (ref 0–20)

## 2018-04-13 LAB — ANA W/REFLEX: Anti Nuclear Antibody(ANA): POSITIVE — AB

## 2018-04-13 MED ORDER — METHYLPREDNISOLONE SODIUM SUCC 40 MG IJ SOLR
40.0000 mg | Freq: Two times a day (BID) | INTRAMUSCULAR | Status: DC
  Administered 2018-04-14: 40 mg via INTRAVENOUS
  Filled 2018-04-13: qty 1

## 2018-04-13 NOTE — [place] Notes (Signed)
Melissa Knox at [place] NAME: Melissa Knox    MR#:  [id number]  DATE OF BIRTH:  11-02-1950  SUBJECTIVE:  CHIEF COMPLAINT: Patient shortness of breath and cough improved  Out of bed to chair  REVIEW OF SYSTEMS:  CONSTITUTIONAL: No fever, fatigue or weakness.  EYES: No blurred or double vision.  EARS, NOSE, AND THROAT: No tinnitus or ear pain.  RESPIRATORY: Somewhat improved cough, shortness of breath, but still having exertional dyspnea, denies wheezing or hemoptysis.  CARDIOVASCULAR: No chest pain, orthopnea, edema.  GASTROINTESTINAL: No nausea, vomiting, diarrhea or abdominal pain.  GENITOURINARY: No dysuria, hematuria.  ENDOCRINE: No polyuria, nocturia,  HEMATOLOGY: No anemia, easy bruising or bleeding SKIN: No rash or lesion. MUSCULOSKELETAL: No joint pain or arthritis.   NEUROLOGIC: No tingling, numbness, weakness.  PSYCHIATRY: No anxiety or depression.   DRUG ALLERGIES:  No Known Allergies  VITALS:  Blood pressure (!) 125/44, pulse 72, temperature 99.1 F (37.3 C), temperature source Oral, resp. rate 18, height 5' (1.524 m), weight 62.9 kg, SpO2 100 %.  PHYSICAL EXAMINATION:  GENERAL:  67 y.o.-year-old patient lying in the bed with no acute distress.  EYES: Pupils equal, round, reactive to light and accommodation. No scleral icterus. Extraocular muscles intact.  HEENT: Head atraumatic, normocephalic. Oropharynx and nasopharynx clear.  NECK:  Supple, no jugular venous distention. No thyroid enlargement, no tenderness.  LUNGS: Moderate breath sounds bilaterally, positive crepitations, no wheezing, rales. No use of accessory muscles of respiration.  CARDIOVASCULAR: S1, S2 normal. No murmurs, rubs, or gallops.  ABDOMEN: Soft, nontender, nondistended. Bowel sounds present. No organomegaly or mass.  EXTREMITIES: No pedal edema, cyanosis, or clubbing.  NEUROLOGIC: Cranial nerves II through XII are intact.  Sensation intact. Gait  not checked.  PSYCHIATRIC: The patient is alert and oriented x 3.  SKIN: No obvious rash, lesion, or ulcer.    LABORATORY PANEL:   CBC Recent Labs  Lab 04/12/18 0447  WBC 5.5  HGB 8.5*  HCT 26.1*  PLT 394   ------------------------------------------------------------------------------------------------------------------  Chemistries  Recent Labs  Lab 04/11/18 0751  04/13/18 0536  NA 142   < > 142  K 3.0*   < > 3.9  CL 105   < > 111  CO2 29   < > 25  GLUCOSE 101*   < > 127*  BUN 14   < > 30*  CREATININE 1.36*   < > 1.56*  CALCIUM 8.4*   < > 8.7*  AST 16  --   --   ALT 9  --   --   ALKPHOS 65  --   --   BILITOT 0.5  --   --    < > = values in this interval not displayed.   ------------------------------------------------------------------------------------------------------------------  Cardiac Enzymes No results for input(s): TROPONINI in the last 168 hours. ------------------------------------------------------------------------------------------------------------------  RADIOLOGY:  No results found.  EKG:   Orders placed or performed during the hospital encounter of 04/11/18  . EKG 12-Lead  . EKG 12-Lead  . ED EKG  . ED EKG    ASSESSMENT AND PLAN:   *Acute on chronic respiratory failure with hypoxia secondary to bilateral interstitial pneumonitis ,  COPD exacerbation due to acute bronchitis Concern for underlying neoplastic process Clinically improving Seen by pulmonology Dr. Humphrey Rolls, patient is not a candidate for bronchoscopy and also patient might be at high risk for pneumothorax given the severe bullous disease on the CT scan -Recommending outpatient PET scan after she  is discharged -ANA ANCA sed rate and anti-GBM antibodies to rule out an inflammatory process.   Legionella antigen as well as mycoplasma titers.  - Continue with the azithromycin and Rocephin and changed to p.o. antibiotics at the time of discharge -On 2 L oxygen at home, currently  requiring 5 lit oxygen, try to taper as she can tolerate. Bronchodilators via nebulizer.  *Chronic diastolic congestive heart failure with elevated right-sided pressure Continue home medications, currently holding losartan because of blood pressures running borderline.  *Hypertension    Continuing Coreg and Lasix but holding losartan and amlodipine because blood pressure is running normal currently.  *Hypothyroidism Continue levothyroxine.  *Hyperlipidemia Continue atorvastatin.  *Hypokalemia Replace potassium .  *Chronic kidney disease stage III Appears stable. Creatinine 1.36-1.49-1.56  *GERD Continue PPI and sucralfate.      All the records are reviewed and case discussed with Care Management/Social Workerr. Management plans discussed with the patient, family and they are in agreement.  CODE STATUS: FC   TOTAL TIME TAKING CARE OF THIS PATIENT: 38 minutes.   POSSIBLE D/C IN 2  DAYS, DEPENDING ON CLINICAL CONDITION.  Note: This dictation was prepared with Dragon dictation along with smaller phrase technology. Any transcriptional errors that result from this process are unintentional.   Nicholes Mango M.D on 04/13/2018 at 4:41 PM  Between 7am to 6pm - Pager - [phone] After 6pm go to [email or web address] - password [email or web address] [facility]  Office  [phone]  CC: Primary care physician; Ronnell Freshwater, NP

## 2018-04-13 NOTE — [place] Notes (Signed)
SATURATION QUALIFICATIONS: (This note is used to comply with regulatory documentation for home oxygen)   Patient Saturations on4Liters of oxygen while Ambulating = 80%  Please briefly explain why patient needs home oxygen:

## 2018-04-14 LAB — ANCA TITERS

## 2018-04-14 LAB — BASIC METABOLIC PANEL
Anion gap: 7 (ref 5–15)
BUN: 36 mg/dL — ABNORMAL HIGH (ref 8–23)
CALCIUM: 8.7 mg/dL — AB (ref 8.9–10.3)
CO2: 24 mmol/L (ref 22–32)
CREATININE: 1.38 mg/dL — AB (ref 0.44–1.00)
Chloride: 110 mmol/L (ref 98–111)
GFR calc Af Amer: 45 mL/min — ABNORMAL LOW (ref >60)
GFR calc non Af Amer: 39 mL/min — ABNORMAL LOW (ref >60)
GLUCOSE: 126 mg/dL — AB (ref 70–99)
Potassium: 4.1 mmol/L (ref 3.5–5.1)
Sodium: 141 mmol/L (ref 135–145)

## 2018-04-14 LAB — MYCOPLASMA PNEUMONIAE ANTIBODY, IGM: Mycoplasma pneumo IgM: <770 U/mL (ref 0–769)

## 2018-04-14 MED ORDER — PREDNISONE 50 MG PO TABS: 50.0000 mg | ORAL_TABLET | Freq: Every day | ORAL | Status: DC

## 2018-04-14 MED ORDER — AZITHROMYCIN 250 MG PO TABS: ORAL_TABLET | ORAL | 0 refills | Status: DC

## 2018-04-14 MED ORDER — DOCUSATE SODIUM 100 MG PO CAPS: 100.0000 mg | ORAL_CAPSULE | Freq: Two times a day (BID) | ORAL | 0 refills | Status: AC | PRN

## 2018-04-14 MED ORDER — CEFDINIR 300 MG PO CAPS
300.0000 mg | ORAL_CAPSULE | Freq: Two times a day (BID) | ORAL | Status: DC
  Filled 2018-04-14: qty 1

## 2018-04-14 MED ORDER — PREDNISONE 10 MG (21) PO TBPK: 10.0000 mg | ORAL_TABLET | Freq: Every day | ORAL | 0 refills | Status: DC

## 2018-04-14 MED ORDER — CEFDINIR 300 MG PO CAPS: 300.0000 mg | ORAL_CAPSULE | Freq: Two times a day (BID) | ORAL | 0 refills | Status: DC

## 2018-04-14 NOTE — Discharge Instructions (Signed)
Follow-up with primary care physician in 3 to 4 days With pulmonology Dr. Humphrey Rolls  in 1 week Follow-up with rheumatology Dr. Jefm Bryant in 1 to 2 weeks

## 2018-04-14 NOTE — Discharge Summary (Signed)
[facility] at [place] NAME: Melissa Knox    MR#:  [id number]  DATE OF BIRTH:  10-Apr-1951  DATE OF ADMISSION:  04/11/2018 ADMITTING PHYSICIAN: Vaughan Basta, MD  DATE OF DISCHARGE:  04/14/18   PRIMARY CARE PHYSICIAN: Ronnell Freshwater, NP    ADMISSION DIAGNOSIS:  Hypoxia [R09.02] Hemoptysis [R04.2]  DISCHARGE DIAGNOSIS:  Principal Problem:   Acute on chronic respiratory failure with hypoxia (HCC) Active Problems:   COPD with acute exacerbation (HCC)   Acute on chronic respiratory failure (HCC)   Interstitial pneumonia (HCC)   Chronic congestive heart failure with left ventricular diastolic dysfunction ([place])   SECONDARY DIAGNOSIS:   Past Medical History:  Diagnosis Date  . Atherosclerotic heart disease 06/12/2015  . CHF (congestive heart failure) ([place])   . Chronic congestive heart failure with left ventricular diastolic dysfunction ([place]) 04/12/2018  . CKD (chronic kidney disease) stage 2, GFR 60-89 ml/min 06/12/2015  . COPD (chronic obstructive pulmonary disease) ([place])   . Hypercalcemia 06/12/2015  . Hyperlipidemia 06/12/2015  . Hypertension 06/12/2015  . Hypothyroidism 06/12/2015  . Insomnia 06/12/2015  . Interstitial pneumonia ([place]) 04/12/2018  . 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 Shortness of breath 06/12/2015  . Solitary pulmonary nodule 06/12/2015    HOSPITAL COURSE:  HISTORY OF PRESENT ILLNESS: Melissa Knox  is a 67 y.o. female with a known history of CHF, chronic chronic kidney disease, COPD, hypercalcemia, hyperlipidemia, hypertension, hypothyroidism, solitary pulmonary nodule, chronic oxygen use at home-since yesterday feeling little bit weak and short of breath.  She also had 1-2 episodes of some blood along with her cough.  Today morning they called EMS and her oxygen saturation was in 40s to 50s, her oxygen concentrator machine at home is also malfunctioning for last few days and so they also  called home health agency to replace the machine. In the emergency room she was noted to be hypoxic up to 60s and with supplemental oxygen of 7 to 8 L saturation came up more than 90%. CT scan of the chest with angiogram was done which did not show any pulmonary embolism but on the right lower lobe there was questionable infiltrate or alveolar pattern.  *Acute on chronic respiratory failure with hypoxia secondary to bilateral interstitial pneumonitis ,  COPD exacerbation due to acute bronchitis Concern for underlying neoplastic process Okay to discharge patient from pulmonology standpoint.  Outpatient follow-up with pulmonology in a week, is considering repeat CT chest first Seen by pulmonology Dr. Humphrey Rolls, patient is not a candidate for bronchoscopy and also patient might be at high risk for pneumothorax given the severe bullous disease on the CT scan -Recommending outpatient PET scan after repeat CT chest if needed -ANA positive , ANCA -3.6  sed rate - 99 - anti-GBM antibodies to rule out an inflammatory process.   Legionella antigen ordered but not done ,still pending, mycoplasma titers <770 -clinically improved with  azithromycin and Rocephin and changed to p.o. antibiotics  To omnicef at the time of discharge -On 2 L-3  oxygen at home, currently requiring 3 lit oxygen Bronchodilators via nebulizer.  *Chronic diastolic congestive heart failure with elevated right-sided pressure Continue home medications, currently holding losartan because of blood pressures running borderline.  *Hypertension Continuing Coreg and Lasix but holding losartan and amlodipine because blood pressure is running normal currently.  *Hypothyroidism Continue levothyroxine.  *Hyperlipidemia Continue atorvastatin.  *Hypokalemia Replaced potassium.  *Chronic kidney disease stage III Appears stable. Creatinine 1.36-1.49-1.56-1.38  *GERD Continue PPI  and sucralfate. DISCHARGE CONDITIONS:    stable  CONSULTS OBTAINED:  Treatment Team:  Allyne Gee, MD   PROCEDURES  None   DRUG ALLERGIES:  No Known Allergies  DISCHARGE MEDICATIONS:   Allergies as of 04/14/2018   No Known Allergies     Medication List    STOP taking these medications   losartan 100 MG tablet Commonly known as:  COZAAR     TAKE these medications   albuterol (2.5 MG/3ML) 0.083% nebulizer solution Commonly known as:  PROVENTIL Take 2.5 mg by nebulization every 6 (six) hours as needed for wheezing or shortness of breath.   amLODipine 10 MG tablet Commonly known as:  NORVASC Take 10 mg by mouth daily.   aspirin EC 81 MG tablet Take 81 mg by mouth daily.   atorvastatin 10 MG tablet Commonly known as:  LIPITOR Take 1 tablet (10 mg total) by mouth daily at 6 PM.   azithromycin 250 MG tablet Commonly known as:  ZITHROMAX Take one tab po once daily Start taking on:  04/15/2018   budesonide-formoterol 160-4.5 MCG/ACT inhaler Commonly known as:  SYMBICORT Inhale 2 puffs into the lungs 2 (two) times daily.   carvedilol 6.25 MG tablet Commonly known as:  COREG Take 6.25 mg by mouth 2 (two) times daily.   cefdinir 300 MG capsule Commonly known as:  OMNICEF Take 1 capsule (300 mg total) by mouth every 12 (twelve) hours.   docusate sodium 100 MG capsule Commonly known as:  COLACE Take 1 capsule (100 mg total) by mouth 2 (two) times daily as needed for mild constipation.   feeding supplement (ENSURE ENLIVE) Liqd Take 237 mLs by mouth 3 (three) times daily between meals.   furosemide 20 MG tablet Commonly known as:  LASIX Take 1 tablet (20 mg total) by mouth daily.   hydrALAZINE 50 MG tablet Commonly known as:  APRESOLINE Take 50 mg by mouth 3 (three) times daily.   levothyroxine 50 MCG tablet Commonly known as:  SYNTHROID, LEVOTHROID Take 1 tablet (50 mcg total) by mouth daily before breakfast.   loratadine 10 MG tablet Commonly known as:  CLARITIN Take 1 tablet (10 mg  total) by mouth daily.   meclizine 12.5 MG tablet Commonly known as:  ANTIVERT Take 12.5 mg by mouth 3 (three) times daily as needed for dizziness.   mirtazapine 15 MG tablet Commonly known as:  REMERON Take 0.5 tablets (7.5 mg total) by mouth at bedtime.   pantoprazole 40 MG tablet Commonly known as:  PROTONIX Take 1 tablet (40 mg total) by mouth 2 (two) times daily.   predniSONE 10 MG (21) Tbpk tablet Commonly known as:  STERAPRED UNI-PAK 21 TAB Take 1 tablet (10 mg total) by mouth daily. Take 6 tablets by mouth for 1 day followed by  5 tablets by mouth for 1 day followed by  4 tablets by mouth for 1 day followed by  3 tablets by mouth for 1 day followed by  2 tablets by mouth for 1 day followed by  1 tablet by mouth for a day and stop   sucralfate 1 g tablet Commonly known as:  CARAFATE Take 1 tablet (1 g total) by mouth 4 (four) times daily -  with meals and at bedtime.        DISCHARGE INSTRUCTIONS:   Follow-up with primary care physician in 3 to 4 days With pulmonology Dr. Humphrey Rolls  in 1 week Follow-up with rheumatology Dr. Jefm Bryant in 1 to 2 weeks  DIET:  Cardiac diet  DISCHARGE CONDITION:  Fair  ACTIVITY:  Activity as tolerated  OXYGEN:  Home Oxygen: Yes.     Oxygen Delivery: 2-3 liters/min via Patient connected to nasal cannula oxygen  DISCHARGE LOCATION:  home   If you experience worsening of your admission symptoms, develop shortness of breath, life threatening emergency, suicidal or homicidal thoughts you must seek medical attention immediately by calling 911 or calling your MD immediately  if symptoms less severe.  You Must read complete instructions/literature along with all the possible adverse reactions/side effects for all the Medicines you take and that have been prescribed to you. Take any new Medicines after you have completely understood and accpet all the possible adverse reactions/side effects.   Please note  You were cared for by a  hospitalist during your hospital stay. If you have any questions about your discharge medications or the care you received while you were in the hospital after you are discharged, you can call the unit and asked to speak with the hospitalist on call if the hospitalist that took care of you is not available. Once you are discharged, your primary care physician will handle any further medical issues. Please note that NO REFILLS for any discharge medications will be authorized once you are discharged, as it is imperative that you return to your primary care physician (or establish a relationship with a primary care physician if you do not have one) for your aftercare needs so that they can reassess your need for medications and monitor your lab values.     Today  Chief Complaint  Patient presents with  . Shortness of Breath  . Hemoptysis   Patient is feeling much better.  Out of bed to chair and ambulating. Oxygen wean down to her home oxygen 2 to 3 L.  Patient husband at bedside okay to discharge patient.  Case management is involved regarding the home oxygen issues  ROS:  CONSTITUTIONAL: Denies fevers, chills. Denies any fatigue, weakness.  EYES: Denies blurry vision, double vision, eye pain. EARS, NOSE, THROAT: Denies tinnitus, ear pain, hearing loss. RESPIRATORY: Denies cough, wheeze, shortness of breath.  CARDIOVASCULAR: Denies chest pain, palpitations, edema.  GASTROINTESTINAL: Denies nausea, vomiting, diarrhea, abdominal pain. Denies bright red blood per rectum. GENITOURINARY: Denies dysuria, hematuria. ENDOCRINE: Denies nocturia or thyroid problems. HEMATOLOGIC AND LYMPHATIC: Denies easy bruising or bleeding. SKIN: Denies rash or lesion. MUSCULOSKELETAL: Denies pain in neck, back, shoulder, knees, hips or arthritic symptoms.  NEUROLOGIC: Denies paralysis, paresthesias.  PSYCHIATRIC: Denies anxiety or depressive symptoms.   VITAL SIGNS:  Blood pressure 117/66, pulse 82, temperature  98.6 F (37 C), temperature source Oral, resp. rate 20, height 5' (1.524 m), weight 62.9 kg, SpO2 98 %.  I/O:    Intake/Output Summary (Last 24 hours) at 04/14/2018 1227 Last data filed at 04/14/2018 1014 Gross per 24 hour  Intake 120 ml  Output -  Net 120 ml    PHYSICAL EXAMINATION:  GENERAL:  67 y.o.-year-old patient lying in the bed with no acute distress.  EYES: Pupils equal, round, reactive to light and accommodation. No scleral icterus. Extraocular muscles intact.  HEENT: Head atraumatic, normocephalic. Oropharynx and nasopharynx clear.  NECK:  Supple, no jugular venous distention. No thyroid enlargement, no tenderness.  LUNGS: Normal breath sounds bilaterally, no wheezing, rales,rhonchi or crepitation. No use of accessory muscles of respiration.  CARDIOVASCULAR: S1, S2 normal. No murmurs, rubs, or gallops.  ABDOMEN: Soft, non-tender, non-distended. Bowel sounds present. No organomegaly or mass.  EXTREMITIES:  No pedal edema, cyanosis, or clubbing.  NEUROLOGIC: Cranial nerves II through XII are intact. Muscle strength 5/5 in all extremities. Sensation intact. Gait not checked.  PSYCHIATRIC: The patient is alert and oriented x 3.  SKIN: No obvious rash, lesion, or ulcer.   DATA REVIEW:   CBC Recent Labs  Lab 04/12/18 0447  WBC 5.5  HGB 8.5*  HCT 26.1*  PLT 394    Chemistries  Recent Labs  Lab 04/11/18 0751  04/14/18 0330  NA 142   < > 141  K 3.0*   < > 4.1  CL 105   < > 110  CO2 29   < > 24  GLUCOSE 101*   < > 126*  BUN 14   < > 36*  CREATININE 1.36*   < > 1.38*  CALCIUM 8.4*   < > 8.7*  AST 16  --   --   ALT 9  --   --   ALKPHOS 65  --   --   BILITOT 0.5  --   --    < > = values in this interval not displayed.    Cardiac Enzymes No results for input(s): TROPONINI in the last 168 hours.  Microbiology Results  Results for orders placed or performed during the hospital encounter of 05/28/17  Culture, blood (routine x 2)     Status: None   Collection  Time: 05/28/17  6:57 PM  Result Value Ref Range Status   Specimen Description BLOOD RIGHT ANTECUBITAL  Final   Special Requests   Final    BOTTLES DRAWN AEROBIC AND ANAEROBIC Blood Culture adequate volume   Culture NO GROWTH 5 DAYS  Final   Report Status 06/02/2017 FINAL  Final  Culture, blood (routine x 2)     Status: None   Collection Time: 05/28/17  6:57 PM  Result Value Ref Range Status   Specimen Description BLOOD LEFT ANTECUBITAL  Final   Special Requests   Final    BOTTLES DRAWN AEROBIC AND ANAEROBIC Blood Culture adequate volume   Culture NO GROWTH 5 DAYS  Final   Report Status 06/02/2017 FINAL  Final    RADIOLOGY:  Ct Angio Chest Pe W And/or Wo Contrast  Result Date: 04/11/2018 CLINICAL DATA:  Weakness, hemoptysis EXAM: CT ANGIOGRAPHY CHEST WITH CONTRAST TECHNIQUE: Multidetector CT imaging of the chest was performed using the standard protocol during bolus administration of intravenous contrast. Multiplanar CT image reconstructions and MIPs were obtained to evaluate the vascular anatomy. CONTRAST:  61mL ISOVUE-370 IOPAMIDOL (ISOVUE-370) INJECTION 76% COMPARISON:  CTA chest dated 05/28/2017 FINDINGS: Cardiovascular: Satisfactory opacification the bilateral pulmonary artery to the segmental level. Evaluation is mildly constrained by respiratory motion. No evidence pulmonary embolism. No evidence of thoracic aortic aneurysm or dissection. Atherosclerotic calcifications of the aortic arch. The heart is normal in size.  No pericardial effusion. Mild coronary atherosclerosis of the LAD. Mediastinum/Nodes: Small mediastinal lymph nodes, including a 13 mm subcarinal node and an 8 mm short axis prevascular node, likely reactive. Visualized thyroid is unremarkable. Lungs/Pleura: Moderate centrilobular and paraseptal emphysematous changes. Dominant paraseptal emphysematous changes in the right lower lobe. No focal consolidation. Mild patchy/ground-glass opacity in the superior segment right lower  lobe (series 6/image 40). This appearance is mildly progressive when compared to 2018, and therefore an acute process such as infection/inflammation is considered less likely. As such, neoplasm such as invasive adenocarcinoma should be considered. Otherwise, no suspicious pulmonary nodules. No frank interstitial edema. Trace right pleural effusion.  No pneumothorax.  Upper Abdomen: Visualized upper abdomen is grossly unremarkable. Musculoskeletal: Visualized osseous structures are within normal limits. Review of the MIP images confirms the above findings. IMPRESSION: No evidence of pulmonary embolism. Progressive patchy/ground-glass opacity in the superior segment right lower lobe. This appearance was present (to a lesser extent) in 2018, making infection/inflammation less likely. Although unusual, neoplasm such as invasive adenocarcinoma should be considered. Consider PET-CT for further evaluation. Aortic Atherosclerosis (ICD10-I70.0) and Emphysema (ICD10-J43.9). Electronically Signed   By: Julian Hy M.D.   On: 04/11/2018 09:41   Dg Chest Portable 1 View  Result Date: 04/11/2018 CLINICAL DATA:  Increased weakness, decreased responsiveness EXAM: PORTABLE CHEST 1 VIEW COMPARISON:  05/28/2017 FINDINGS: Diffuse coarsened interstitial opacities throughout the lungs, most pronounced in the mid and lower lung zones most compatible with fibrosis. Underlying COPD. Heart is borderline in size. Small right effusions suspected. No acute bony abnormality. IMPRESSION: Diffuse coarsened opacities throughout the lungs, most likely chronic interstitial lung disease/fibrosis. COPD. Electronically Signed   By: Rolm Baptise M.D.   On: 04/11/2018 08:20    EKG:   Orders placed or performed during the hospital encounter of 04/11/18  . EKG 12-Lead  . EKG 12-Lead  . ED EKG  . ED EKG      Management plans discussed with the patient, family and they are in agreement.  CODE STATUS:     Code Status Orders  (From  admission, onward)         Start     Ordered   04/11/18 1257  Full code  Continuous     04/11/18 1256        Code Status History    Date Active Date Inactive Code Status Order ID Comments User Context   05/29/2017 1024 05/30/2017 1819 DNR [id number]  Hillary Bow, MD Inpatient   05/28/2017 2323 05/29/2017 1024 Full Code [id number]  Quintella Baton, MD Inpatient   02/06/2017 1525 02/08/2017 1404 Full Code [id number]  Idelle Crouch, MD ED      TOTAL TIME TAKING CARE OF THIS PATIENT: 43  minutes.   Note: This dictation was prepared with Dragon dictation along with smaller phrase technology. Any transcriptional errors that result from this process are unintentional.   @[facility] @  on 04/14/2018 at 12:27 PM  Between 7am to 6pm - Pager - [phone]  After 6pm go to [email or web address] - password [email or web address] [facility]  Office  [phone]  CC: Primary care physician; Ronnell Freshwater, NP

## 2018-04-14 NOTE — [place] Notes (Signed)
Patient discharge teaching given, including activity, diet, follow-up appoints, and medications. Patient verbalized understanding of all discharge instructions. IV access was d/c'd. Vitals are stable. Skin is intact except as charted in most recent assessments. Pt to be escorted out by volunteer, to be driven home by family.  Alleene Stoy  

## 2018-04-14 NOTE — Care Management Note (Signed)
Case Management Note  Patient Details  Name: Sharnelle Cappelli MRN: [id number] Date of Birth: October 05, 1950   Patient to discharge home today.  Patient admitted for COPD and PNA.  Patient lives at home with husband.  PCP [place].  Denies issues with obtaining medication or transportation.  Prior to admission patient states that she was having issues with her O2 at home, and that why her saturations dropped so low prior to admission.  Husband has spoken directly with [facility] and new concentrator was delivered to home one Tuesday.  Patient confirms that the O2 is now in working condition.  Patient states that she also has a nebulizer in the home.  No further needs identified.  Husband to bring portable o2 at discharge.   Subjective/Objective:                    Action/Plan:   Expected Discharge Date:  04/14/18               Expected Discharge Plan:     In-House Referral:     Discharge planning Services     Post Acute Care Choice:    Choice offered to:     DME Arranged:    DME Agency:     HH Arranged:    HH Agency:     Status of Service:     If discussed at [employer], dates discussed:    Additional Comments:  Beverly Sessions, RN 04/14/2018, 11:09 AM

## 2018-04-14 NOTE — Care Management Important Message (Signed)
Copy of signed IM left with patient in room.  

## 2018-04-17 ENCOUNTER — Telehealth: Payer: Self-pay | Admitting: Family

## 2018-04-17 ENCOUNTER — Ambulatory Visit: Payer: Medicare Other | Admitting: Family

## 2018-04-17 NOTE — Progress Notes (Deleted)
Patient ID: Melissa Knox, female    DOB: [birth year], 67 y.o.   MRN: [id number]  HPI  Melissa Knox is a 67 y/o female with a history of CAD, CKD, hyperlipidemia, HTN, hypothyroidism, COPD, pulmonary nodule, previous tobacco use and chronic heart failure.  Echo done 01/04/18 which shows an EF of 62% along with mild MR and moderate TR. Echo report from 10/04/16 reviewed and showed an EF of 80% along with a PA pressure of 33 mm Hg.  Admitted 04/11/18 due to COPD exacerbation along with pneumonitis. Chest CT with angiogram done which showed questionable infiltrate. Pulmonology consult obtained. Medications adjusted and she was discharged after 3 days. Admitted 01/17/18 due to acute on chronic HF. Medications adjusted and was discharged after 5 days.    She presents today for a follow-up visit with a chief complaint of   Past Medical History:  Diagnosis Date  . Atherosclerotic heart disease 06/12/2015  . CHF (congestive heart failure) ([place])   . Chronic congestive heart failure with left ventricular diastolic dysfunction ([place]) 04/12/2018  . CKD (chronic kidney disease) stage 2, GFR 60-89 ml/min 06/12/2015  . COPD (chronic obstructive pulmonary disease) ([place])   . Hypercalcemia 06/12/2015  . Hyperlipidemia 06/12/2015  . Hypertension 06/12/2015  . Hypothyroidism 06/12/2015  . Insomnia 06/12/2015  . Interstitial pneumonia ([place]) 04/12/2018  . 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 Shortness of breath 06/12/2015  . Solitary pulmonary nodule 06/12/2015   Past Surgical History:  Procedure Laterality Date  . GALLBLADDER SURGERY  2012   Family History  Problem Relation Age of Onset  . Cancer Mother   . Hypertension Father   . Diabetes Father   . Cancer Father    Social History   Tobacco Use  . Smoking status: Former Smoker    Last attempt to quit: 2013    Years since quitting: 6.6  . Smokeless tobacco: Never Used  Substance Use Topics  . Alcohol use: No    Alcohol/week: 0.0 standard drinks    No Known Allergies    Review of Systems  Constitutional: Positive for fatigue. Negative for appetite change.  HENT: Positive for postnasal drip and rhinorrhea. Negative for congestion and sore throat.   Eyes: Negative.   Respiratory: Positive for cough (productive) and shortness of breath (with moderate exertion). Negative for chest tightness and wheezing.   Cardiovascular: Negative for chest pain, palpitations and leg swelling.  Gastrointestinal: Negative for abdominal distention and abdominal pain.  Endocrine: Negative.   Genitourinary: Negative.   Musculoskeletal: Positive for arthralgias (left hip pain) and back pain.  Skin: Negative.   Allergic/Immunologic: Negative.   Neurological: Negative for dizziness and light-headedness.  Hematological: Negative for adenopathy. Does not bruise/bleed easily.  Psychiatric/Behavioral: Positive for sleep disturbance (wearing oxygen @ 2L; sleeping on 1 pillow). Negative for dysphoric mood. The patient is not nervous/anxious.      Physical Exam  Constitutional: She is oriented to person, place, and time. She appears well-developed and well-nourished.  HENT:  Head: Normocephalic and atraumatic.  Neck: Normal range of motion. Neck supple. No JVD present.  Cardiovascular: Normal rate and regular rhythm.  Pulmonary/Chest: Effort normal. She has no wheezes. She has no rales.  Abdominal: Soft. She exhibits no distension. There is no tenderness.  Musculoskeletal: She exhibits no edema or tenderness.  Neurological: She is alert and oriented to person, place, and time.  Skin: Skin is warm and dry.  Psychiatric: She has a normal mood and affect. Her behavior is normal. Thought content normal.  Nursing note and vitals reviewed.  Assessment & Plan:   1: Chronic heart failure with preserved ejection fraction- - NYHA class II - euvolemic today - weighing daily and she says that her weight has been stable. Reminded to call for an overnight weight  gain of >2 pounds or a weekly weight gain of >5 pounds - weight - not adding salt and has been reading food labels. Reviewed the importance of closely following a 2000mg  sodium diet  - has been walking 3 times / week for 30 minutes each time - BNP 04/11/18 was 328.0  2: HTN- - BP looks good today - saw PCP (Melissa Knox) on 10/18/17 - BMP from 04/14/18 reviewed and shows sodium 141, potassium 4.1 and GFR 39. - follows with nephrology Melissa Knox) who she says increased her carvedilol due to tachycardia  3: COPD-  - wears oxygen at 2L around the clock - can use her nebulizer 3 times/ day - saw pulmonologist Melissa Knox) 03/23/18  4: Allergies- - advised patient to take either claritin/zyrtec daily as well as mucinex dailiy  Patient did not bring her medications nor a list. Each medication was verbally reviewed with the patient and she was encouraged to bring the bottles to every visit to confirm accuracy of list.

## 2018-04-17 NOTE — Telephone Encounter (Signed)
Patient did not show for her [facility] appointment on 04/17/18. Will attempt to reschedule.   This is the 3rd appointment that she has missed.

## 2018-04-18 ENCOUNTER — Ambulatory Visit (INDEPENDENT_AMBULATORY_CARE_PROVIDER_SITE_OTHER): Payer: Medicare Other | Admitting: Internal Medicine

## 2018-04-18 ENCOUNTER — Other Ambulatory Visit: Payer: Self-pay

## 2018-04-18 ENCOUNTER — Encounter: Payer: Self-pay | Admitting: Internal Medicine

## 2018-04-18 VITALS — BP 151/63 | HR 85 | Resp 16 | Ht 60.0 in | Wt 140.8 lb

## 2018-04-18 DIAGNOSIS — Z1231 Encounter for screening mammogram for malignant neoplasm of breast: Secondary | ICD-10-CM | POA: Diagnosis not present

## 2018-04-18 DIAGNOSIS — J9611 Chronic respiratory failure with hypoxia: Secondary | ICD-10-CM

## 2018-04-18 DIAGNOSIS — J449 Chronic obstructive pulmonary disease, unspecified: Secondary | ICD-10-CM

## 2018-04-18 DIAGNOSIS — Z1211 Encounter for screening for malignant neoplasm of colon: Secondary | ICD-10-CM | POA: Diagnosis not present

## 2018-04-18 DIAGNOSIS — R768 Other specified abnormal immunological findings in serum: Secondary | ICD-10-CM

## 2018-04-18 DIAGNOSIS — D631 Anemia in chronic kidney disease: Secondary | ICD-10-CM

## 2018-04-18 DIAGNOSIS — Z1239 Encounter for other screening for malignant neoplasm of breast: Secondary | ICD-10-CM

## 2018-04-18 DIAGNOSIS — N189 Chronic kidney disease, unspecified: Principal | ICD-10-CM

## 2018-04-18 MED ORDER — AMLODIPINE BESYLATE 10 MG PO TABS: 10.0000 mg | ORAL_TABLET | Freq: Every day | ORAL | 12 refills | Status: DC

## 2018-04-18 NOTE — Progress Notes (Signed)
[facility], [place] [address]  Internal MEDICINE  Office Visit Note  Patient Name: Melissa Knox  [id number]  [id number]  Date of Service: 04/25/2018  Chief Complaint  Patient presents with  . Shortness of Breath    hospital follow up  . Hemoptysis  . Quality Metric Gaps    colonoscopy, mammogram    HPI  Pt is here for follow up after her hospitalization, She has been coughing blood. She had a CT scan and labs for connective tissue disease, some of the labs are abnormal and will need a pulmonary follow up. She will also need to see GI for colonoscopy however is high risk due to O2 dependence at all times. She is still sob    Current Medication: Outpatient Encounter Medications as of 04/18/2018  Medication Sig  . albuterol (PROVENTIL) (2.5 MG/3ML) 0.083% nebulizer solution Take 2.5 mg by nebulization every 6 (six) hours as needed for wheezing or shortness of breath.  Marland Kitchen aspirin EC 81 MG tablet Take 81 mg by mouth daily.   Marland Kitchen atorvastatin (LIPITOR) 10 MG tablet Take 1 tablet (10 mg total) by mouth daily at 6 PM.  . azithromycin (ZITHROMAX) 250 MG tablet Take one tab po once daily  . budesonide-formoterol (SYMBICORT) 160-4.5 MCG/ACT inhaler Inhale 2 puffs into the lungs 2 (two) times daily.  . carvedilol (COREG) 6.25 MG tablet Take 6.25 mg by mouth 2 (two) times daily.  . cefdinir (OMNICEF) 300 MG capsule Take 1 capsule (300 mg total) by mouth every 12 (twelve) hours.  . docusate sodium (COLACE) 100 MG capsule Take 1 capsule (100 mg total) by mouth 2 (two) times daily as needed for mild constipation.  . feeding supplement, ENSURE ENLIVE, (ENSURE ENLIVE) LIQD Take 237 mLs by mouth 3 (three) times daily between meals.  . furosemide (LASIX) 20 MG tablet Take 1 tablet (20 mg total) by mouth daily.  . hydrALAZINE (APRESOLINE) 50 MG tablet Take 50 mg by mouth 3 (three) times daily.   Marland Kitchen levothyroxine (SYNTHROID, LEVOTHROID) 50 MCG tablet Take 1 tablet (50  mcg total) by mouth daily before breakfast.  . loratadine (CLARITIN) 10 MG tablet Take 1 tablet (10 mg total) by mouth daily.  . meclizine (ANTIVERT) 12.5 MG tablet Take 12.5 mg by mouth 3 (three) times daily as needed for dizziness.   . mirtazapine (REMERON) 15 MG tablet Take 0.5 tablets (7.5 mg total) by mouth at bedtime.  . pantoprazole (PROTONIX) 40 MG tablet Take 1 tablet (40 mg total) by mouth 2 (two) times daily.  . predniSONE (STERAPRED UNI-PAK 21 TAB) 10 MG (21) TBPK tablet Take 1 tablet (10 mg total) by mouth daily. Take 6 tablets by mouth for 1 day followed by  5 tablets by mouth for 1 day followed by  4 tablets by mouth for 1 day followed by  3 tablets by mouth for 1 day followed by  2 tablets by mouth for 1 day followed by  1 tablet by mouth for a day and stop  . sucralfate (CARAFATE) 1 g tablet Take 1 tablet (1 g total) by mouth 4 (four) times daily -  with meals and at bedtime.  . [DISCONTINUED] amLODipine (NORVASC) 10 MG tablet Take 10 mg by mouth daily.   No facility-administered encounter medications on file as of 04/18/2018.     Surgical History: Past Surgical History:  Procedure Laterality Date  . GALLBLADDER SURGERY  2012    Medical History: Past Medical History:  Diagnosis Date  .  Atherosclerotic heart disease 06/12/2015  . CHF (congestive heart failure) ([place])   . Chronic congestive heart failure with left ventricular diastolic dysfunction ([place]) 04/12/2018  . CKD (chronic kidney disease) stage 2, GFR 60-89 ml/min 06/12/2015  . COPD (chronic obstructive pulmonary disease) ([place])   . Hypercalcemia 06/12/2015  . Hyperlipidemia 06/12/2015  . Hypertension 06/12/2015  . Hypothyroidism 06/12/2015  . Insomnia 06/12/2015  . Interstitial pneumonia ([place]) 04/12/2018  . 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) 06/12/2015  . Shortness of breath 06/12/2015  . Solitary pulmonary nodule 06/12/2015    Family History: Family History  Problem Relation Age of Onset  . Cancer Mother    . Hypertension Father   . Diabetes Father   . Cancer Father     Social History   Socioeconomic History  . Marital status: Married    Spouse name: Daryl  . Number of children: 3  . Years of education: 19  . Highest education level: 12th grade  Occupational History  . Not on file  Social Needs  . Financial resource strain: Not hard at all  . Food insecurity:    Worry: Never true    Inability: Never true  . Transportation needs:    Medical: No    Non-medical: No  Tobacco Use  . Smoking status: Former Smoker    Last attempt to quit: 2013    Years since quitting: 6.6  . Smokeless tobacco: Never Used  Substance and Sexual Activity  . Alcohol use: No    Alcohol/week: 0.0 standard drinks  . Drug use: No  . Sexual activity: Yes    Birth control/protection: None  Lifestyle  . Physical activity:    Days per week: 2 days    Minutes per session: 30 min  . Stress: Not at all  Relationships  . Social connections:    Talks on phone: Twice a week    Gets together: Once a week    Attends religious service: 1 to 4 times per year    Active member of club or organization: No    Attends meetings of clubs or organizations: Never    Relationship status: Married  . Intimate partner violence:    Fear of current or ex partner: No    Emotionally abused: No    Physically abused: No    Forced sexual activity: No  Other Topics Concern  . Not on file  Social History Narrative  . Not on file     Review of Systems  Constitutional: Negative for chills, diaphoresis and fatigue.  HENT: Negative for ear pain, postnasal drip and sinus pressure.   Eyes: Negative for photophobia, discharge, redness, itching and visual disturbance.  Respiratory: Positive for shortness of breath. Negative for cough and wheezing.   Cardiovascular: Negative for chest pain, palpitations and leg swelling.  Gastrointestinal: Negative for abdominal pain, constipation, diarrhea, nausea and vomiting.  Genitourinary:  Negative for dysuria and flank pain.  Musculoskeletal: Negative for arthralgias, back pain, gait problem and neck pain.  Skin: Negative for color change.  Allergic/Immunologic: Negative for environmental allergies and food allergies.  Neurological: Negative for dizziness and headaches.  Hematological: Does not bruise/bleed easily.  Psychiatric/Behavioral: Negative for agitation, behavioral problems (depression) and hallucinations.    Vital Signs: BP (!) 151/63 (BP Location: Left Arm, Patient Position: Sitting, Cuff Size: Normal)   Pulse 85   Resp 16   Ht 5' (1.524 m)   Wt 140 lb 12.8 oz (63.9 kg)   SpO2 92%   BMI 27.50 kg/m  Physical Exam  Constitutional: She is oriented to person, place, and time. She appears well-developed and well-nourished. No distress.  HENT:  Head: Normocephalic and atraumatic.  Mouth/Throat: Oropharynx is clear and moist. No oropharyngeal exudate.  Eyes: Pupils are equal, round, and reactive to light. EOM are normal.  Neck: Normal range of motion. Neck supple. No JVD present. No tracheal deviation present. No thyromegaly present.  Cardiovascular: Normal rate, regular rhythm and normal heart sounds. Exam reveals no gallop and no friction rub.  No murmur heard. Pulmonary/Chest: Effort normal. No respiratory distress. She has decreased breath sounds. She has no wheezes. She has no rales. She exhibits no tenderness.  Abdominal: Soft. Bowel sounds are normal.  Musculoskeletal: Normal range of motion.  Lymphadenopathy:    She has no cervical adenopathy.  Neurological: She is alert and oriented to person, place, and time. No cranial nerve deficit.  Skin: Skin is warm and dry. She is not diaphoretic.  Psychiatric: She has a normal mood and affect. Her behavior is normal. Judgment and thought content normal.    Assessment/Plan: 1. Obstructive chronic bronchitis without exacerbation ([place]) - Contiue all MDI as before, will need optimization in her therapy  -  Pulmonary Function Test; Future  2. Chronic respiratory failure with hypoxia ([facility]) - Continue On O2, Needs further diagnostics and will need to see pulmonary to review all labs and CT Chest  - Pulmonary Function Test; Future - Ambulatory referral to Pulmonology  3. Abnormal ANCA test - Needs to see Pulmonary // might need bronch  - Ambulatory referral to Pulmonology  4. Breast screening - MM 3D SCREEN BREAST BILATERAL; Future  5. Encounter for screening colonoscopy - Ambulatory referral to Gastroenterology for colonoscopy   General Counseling: Druanne verbalizes understanding of the findings of todays visit and agrees with plan of treatment. I have discussed any further diagnostic evaluation that may be needed or ordered today. We also reviewed her medications today. she has been encouraged to call the office with any questions or concerns that should arise related to todays visit.    Orders Placed This Encounter  Procedures  . MM 3D SCREEN BREAST BILATERAL  . Ambulatory referral to Pulmonology  . Ambulatory referral to Gastroenterology  . Pulmonary Function Test     Time spent:25 Minutes   Dr Lavera Guise Internal medicine

## 2018-04-21 ENCOUNTER — Other Ambulatory Visit: Payer: Self-pay

## 2018-04-21 ENCOUNTER — Ambulatory Visit: Payer: Self-pay | Admitting: Adult Health

## 2018-04-21 DIAGNOSIS — Z1211 Encounter for screening for malignant neoplasm of colon: Secondary | ICD-10-CM

## 2018-04-27 DIAGNOSIS — J8410 Pulmonary fibrosis, unspecified: Secondary | ICD-10-CM | POA: Diagnosis not present

## 2018-04-27 DIAGNOSIS — J449 Chronic obstructive pulmonary disease, unspecified: Secondary | ICD-10-CM | POA: Diagnosis not present

## 2018-04-27 DIAGNOSIS — I5032 Chronic diastolic (congestive) heart failure: Secondary | ICD-10-CM | POA: Diagnosis not present

## 2018-04-27 DIAGNOSIS — R9389 Abnormal findings on diagnostic imaging of other specified body structures: Secondary | ICD-10-CM | POA: Diagnosis not present

## 2018-04-27 DIAGNOSIS — J849 Interstitial pulmonary disease, unspecified: Secondary | ICD-10-CM | POA: Diagnosis not present

## 2018-04-27 DIAGNOSIS — R768 Other specified abnormal immunological findings in serum: Secondary | ICD-10-CM | POA: Diagnosis not present

## 2018-04-28 DIAGNOSIS — R9389 Abnormal findings on diagnostic imaging of other specified body structures: Secondary | ICD-10-CM | POA: Diagnosis not present

## 2018-04-28 DIAGNOSIS — R768 Other specified abnormal immunological findings in serum: Secondary | ICD-10-CM | POA: Diagnosis not present

## 2018-05-02 ENCOUNTER — Other Ambulatory Visit: Payer: Self-pay | Admitting: Internal Medicine

## 2018-05-02 DIAGNOSIS — J849 Interstitial pulmonary disease, unspecified: Secondary | ICD-10-CM

## 2018-05-02 DIAGNOSIS — R9389 Abnormal findings on diagnostic imaging of other specified body structures: Secondary | ICD-10-CM

## 2018-05-08 ENCOUNTER — Ambulatory Visit
Admission: RE | Admit: 2018-05-08 | Discharge: 2018-05-08 | Disposition: A | Discharge status: Home / Self Care | Payer: Medicare Other | Source: Ambulatory Visit | Attending: Internal Medicine | Admitting: Internal Medicine

## 2018-05-08 DIAGNOSIS — J90 Pleural effusion, not elsewhere classified: Secondary | ICD-10-CM | POA: Diagnosis not present

## 2018-05-08 DIAGNOSIS — N200 Calculus of kidney: Secondary | ICD-10-CM | POA: Diagnosis not present

## 2018-05-08 DIAGNOSIS — J438 Other emphysema: Secondary | ICD-10-CM | POA: Insufficient documentation

## 2018-05-08 DIAGNOSIS — J479 Bronchiectasis, uncomplicated: Secondary | ICD-10-CM | POA: Diagnosis not present

## 2018-05-08 DIAGNOSIS — I2510 Atherosclerotic heart disease of native coronary artery without angina pectoris: Secondary | ICD-10-CM | POA: Diagnosis not present

## 2018-05-08 DIAGNOSIS — J432 Centrilobular emphysema: Secondary | ICD-10-CM | POA: Diagnosis not present

## 2018-05-08 DIAGNOSIS — I700 Atherosclerosis of aorta: Secondary | ICD-10-CM | POA: Diagnosis not present

## 2018-05-08 DIAGNOSIS — J849 Interstitial pulmonary disease, unspecified: Secondary | ICD-10-CM | POA: Insufficient documentation

## 2018-05-08 DIAGNOSIS — R9389 Abnormal findings on diagnostic imaging of other specified body structures: Secondary | ICD-10-CM | POA: Diagnosis not present

## 2018-05-08 DIAGNOSIS — R918 Other nonspecific abnormal finding of lung field: Secondary | ICD-10-CM | POA: Insufficient documentation

## 2018-05-08 DIAGNOSIS — J181 Lobar pneumonia, unspecified organism: Secondary | ICD-10-CM | POA: Diagnosis not present

## 2018-05-10 ENCOUNTER — Ambulatory Visit (INDEPENDENT_AMBULATORY_CARE_PROVIDER_SITE_OTHER): Payer: Medicare Other | Admitting: Internal Medicine

## 2018-05-10 DIAGNOSIS — R0602 Shortness of breath: Secondary | ICD-10-CM | POA: Diagnosis not present

## 2018-05-10 LAB — PULMONARY FUNCTION TEST

## 2018-05-11 ENCOUNTER — Ambulatory Visit: Payer: Self-pay | Admitting: Internal Medicine

## 2018-05-15 NOTE — Procedures (Signed)
[place], [address]  DATE OF SERVICE: May 10, 2018  Complete Pulmonary Function Testing Interpretation:  FINDINGS:  Forced vital capacity is normal the FEV1 is normal the FEV1 FVC ratio is mildly decreased.  Postbronchodilator there is no significant improvement in the FEV1 however clinical improvement may still occur in the absence of spirometric improvement.  Total lung capacity is normal as measured by the nitrogen washout.  Residual volume is decreased residual in total lung capacity ratio is decreased FRC is normal.  The DLCO is severely reduced.  IMPRESSION:  This pulmonary function study is difficult to interpret the patient basically has a normal spirometry as well as normal lung volumes as measured by the nitrogen washout.  Patient had difficulty with the body box as well as the DLCO maneuver so clinical correlation is recommended.  Allyne Gee, MD [facility] Pulmonary Critical Care Medicine Sleep Medicine

## 2018-05-17 ENCOUNTER — Encounter: Payer: Self-pay | Admitting: Family

## 2018-05-17 ENCOUNTER — Ambulatory Visit: Payer: Medicare Other | Attending: Family | Admitting: Family

## 2018-05-17 VITALS — BP 134/55 | HR 97 | Resp 18 | Ht 60.0 in | Wt 144.4 lb

## 2018-05-17 DIAGNOSIS — I10 Essential (primary) hypertension: Secondary | ICD-10-CM

## 2018-05-17 DIAGNOSIS — Z87891 Personal history of nicotine dependence: Secondary | ICD-10-CM | POA: Insufficient documentation

## 2018-05-17 DIAGNOSIS — J449 Chronic obstructive pulmonary disease, unspecified: Secondary | ICD-10-CM | POA: Insufficient documentation

## 2018-05-17 DIAGNOSIS — Z79890 Hormone replacement therapy (postmenopausal): Secondary | ICD-10-CM | POA: Diagnosis not present

## 2018-05-17 DIAGNOSIS — E785 Hyperlipidemia, unspecified: Secondary | ICD-10-CM | POA: Insufficient documentation

## 2018-05-17 DIAGNOSIS — G4700 Insomnia, unspecified: Secondary | ICD-10-CM | POA: Insufficient documentation

## 2018-05-17 DIAGNOSIS — Z7982 Long term (current) use of aspirin: Secondary | ICD-10-CM | POA: Insufficient documentation

## 2018-05-17 DIAGNOSIS — E039 Hypothyroidism, unspecified: Secondary | ICD-10-CM | POA: Diagnosis not present

## 2018-05-17 DIAGNOSIS — I2510 Atherosclerotic heart disease of native coronary artery without angina pectoris: Secondary | ICD-10-CM | POA: Diagnosis not present

## 2018-05-17 DIAGNOSIS — N182 Chronic kidney disease, stage 2 (mild): Secondary | ICD-10-CM | POA: Insufficient documentation

## 2018-05-17 DIAGNOSIS — I5032 Chronic diastolic (congestive) heart failure: Secondary | ICD-10-CM | POA: Insufficient documentation

## 2018-05-17 DIAGNOSIS — I130 Hypertensive heart and chronic kidney disease with heart failure and stage 1 through stage 4 chronic kidney disease, or unspecified chronic kidney disease: Secondary | ICD-10-CM | POA: Insufficient documentation

## 2018-05-17 DIAGNOSIS — Z79899 Other long term (current) drug therapy: Secondary | ICD-10-CM | POA: Diagnosis not present

## 2018-05-17 DIAGNOSIS — I509 Heart failure, unspecified: Secondary | ICD-10-CM | POA: Diagnosis present

## 2018-05-17 NOTE — Progress Notes (Signed)
Patient ID: Melissa Knox, female    DOB: 1951-07-01, 67 y.o.   MRN: [id number]  HPI  Melissa Knox is a 67 y/o female with a history of CAD, CKD, hyperlipidemia, HTN, hypothyroidism, COPD, pulmonary nodule, previous tobacco use and chronic heart failure.  Echo done 01/04/18 which shows an EF of 62% along with mild MR and moderate TR. Echo report from 10/04/16 reviewed and showed an EF of 80% along with a PA pressure of 33 mm Hg.  Admitted 04/11/18 due to COPD exacerbation along with pneumonitis. Chest CT with angiogram done which showed questionable infiltrate. Pulmonology consult obtained. Medications adjusted and Melissa Knox was discharged after 3 days. Admitted 01/17/18 due to acute on chronic HF. Medications adjusted and was discharged after 5 days.    Melissa Knox presents today for a follow-up visit with a chief complaint of minimal shortness of breath upon moderate exertion. Melissa Knox describes this as chronic in nature having been present for several years. Melissa Knox has associated fatigue, cough and gradual weight gain along with this. Melissa Knox denies any difficulty sleeping, abdominal distention, palpitations, pedal edema, chest pain or dizziness.   Past Medical History:  Diagnosis Date  . Atherosclerotic heart disease 06/12/2015  . CHF (congestive heart failure) ([place])   . Chronic congestive heart failure with left ventricular diastolic dysfunction ([name]) 04/12/2018  . CKD (chronic kidney disease) stage 2, GFR 60-89 ml/min 06/12/2015  . COPD (chronic obstructive pulmonary disease) ([place])   . Hypercalcemia 06/12/2015  . Hyperlipidemia 06/12/2015  . Hypertension 06/12/2015  . Hypothyroidism 06/12/2015  . Insomnia 06/12/2015  . Interstitial pneumonia ([place]) 04/12/2018  . 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 Shortness of breath 06/12/2015  . Solitary pulmonary nodule 06/12/2015   Past Surgical History:  Procedure Laterality Date  . GALLBLADDER SURGERY  2012   Family History  Problem Relation Age of Onset  . Cancer  Mother   . Hypertension Father   . Diabetes Father   . Cancer Father    Social History   Tobacco Use  . Smoking status: Former Smoker    Last attempt to quit: 2013    Years since quitting: 6.7  . Smokeless tobacco: Never Used  Substance Use Topics  . Alcohol use: No    Alcohol/week: 0.0 standard drinks   No Known Allergies  Prior to Admission medications   Medication Sig Start Date End Date Taking? Authorizing Provider  albuterol (PROVENTIL) (2.5 MG/3ML) 0.083% nebulizer solution Take 2.5 mg by nebulization every 6 (six) hours as needed for wheezing or shortness of breath.   Yes [provider]  amLODipine (NORVASC) 10 MG tablet Take 1 tablet (10 mg total) by mouth daily. 04/18/18  Yes Lavera Guise, MD  aspirin EC 81 MG tablet Take 81 mg by mouth daily.  01/22/18 01/22/19 Yes [provider]  atorvastatin (LIPITOR) 10 MG tablet Take 1 tablet (10 mg total) by mouth daily at 6 PM. 01/12/18  Yes Boscia, Greer Ee, NP  azithromycin (ZITHROMAX) 250 MG tablet Take one tab po once daily 04/15/18  Yes Gouru, Aruna, MD  budesonide-formoterol (SYMBICORT) 160-4.5 MCG/ACT inhaler Inhale 2 puffs into the lungs 2 (two) times daily.   Yes [provider]  carvedilol (COREG) 6.25 MG tablet Take 6.25 mg by mouth 2 (two) times daily. 10/03/17  Yes [provider]  docusate sodium (COLACE) 100 MG capsule Take 1 capsule (100 mg total) by mouth 2 (two) times daily as needed for mild constipation. 04/14/18  Yes Nicholes Mango, MD  furosemide (  LASIX) 20 MG tablet Take 1 tablet (20 mg total) by mouth daily. 03/22/18  Yes Boscia, Greer Ee, NP  hydrALAZINE (APRESOLINE) 50 MG tablet Take 50 mg by mouth 3 (three) times daily.  04/10/16  Yes [provider]  levothyroxine (SYNTHROID, LEVOTHROID) 50 MCG tablet Take 1 tablet (50 mcg total) by mouth daily before breakfast. 02/20/18  Yes Boscia, Heather E, NP  loratadine (CLARITIN) 10 MG tablet Take 1 tablet (10 mg total) by mouth  daily. Patient taking differently: Take 10 mg by mouth daily as needed.  02/06/18  Yes Allyne Gee, MD  losartan (COZAAR) 100 MG tablet Take 100 mg by mouth daily.   Yes [provider]  meclizine (ANTIVERT) 12.5 MG tablet Take 12.5 mg by mouth 3 (three) times daily as needed for dizziness.  06/02/16  Yes [provider]  mirtazapine (REMERON) 15 MG tablet Take 0.5 tablets (7.5 mg total) by mouth at bedtime. 02/17/18  Yes Boscia, Heather E, NP  pantoprazole (PROTONIX) 40 MG tablet Take 1 tablet (40 mg total) by mouth 2 (two) times daily. 02/20/18  Yes Boscia, Greer Ee, NP  sucralfate (CARAFATE) 1 g tablet Take 1 tablet (1 g total) by mouth 4 (four) times daily -  with meals and at bedtime. 01/27/18  Yes Ronnell Freshwater, NP    Review of Systems  Constitutional: Positive for fatigue. Negative for appetite change.  HENT: Positive for postnasal drip and rhinorrhea. Negative for congestion and sore throat.   Eyes: Negative.   Respiratory: Positive for cough (productive) and shortness of breath (with moderate exertion). Negative for chest tightness and wheezing.   Cardiovascular: Negative for chest pain, palpitations and leg swelling.  Gastrointestinal: Negative for abdominal distention and abdominal pain.  Endocrine: Negative.   Genitourinary: Negative.   Musculoskeletal: Positive for arthralgias (left hip pain) and back pain.  Skin: Negative.   Allergic/Immunologic: Negative.   Neurological: Negative for dizziness and light-headedness.  Hematological: Negative for adenopathy. Does not bruise/bleed easily.  Psychiatric/Behavioral: Negative for dysphoric mood and sleep disturbance (wearing oxygen @ 2L; sleeping on 1 pillow). The patient is not nervous/anxious.    Vitals:   05/17/18 0934  BP: (!) 134/55  Pulse: 97  Resp: 18  SpO2: (!) 88%  Weight: 144 lb 6 oz (65.5 kg)  Height: 5' (1.524 m)   Wt Readings from Last 3 Encounters:  05/17/18 144 lb 6 oz (65.5 kg)  04/18/18  140 lb 12.8 oz (63.9 kg)  04/11/18 138 lb 10.7 oz (62.9 kg)   Lab Results  Component Value Date   CREATININE 1.38 (H) 04/14/2018   CREATININE 1.56 (H) 04/13/2018   CREATININE 1.49 (H) 04/12/2018    Physical Exam  Constitutional: Melissa Knox is oriented to person, place, and time. Melissa Knox appears well-developed and well-nourished.  HENT:  Head: Normocephalic and atraumatic.  Neck: Normal range of motion. Neck supple. No JVD present.  Cardiovascular: Normal rate and regular rhythm.  Pulmonary/Chest: Effort normal. Melissa Knox has no wheezes. Melissa Knox has no rales.  Abdominal: Soft. Melissa Knox exhibits no distension. There is no tenderness.  Musculoskeletal: Melissa Knox exhibits no edema or tenderness.  Neurological: Melissa Knox is alert and oriented to person, place, and time.  Skin: Skin is warm and dry.  Psychiatric: Melissa Knox has a normal mood and affect. Her behavior is normal. Thought content normal.  Nursing note and vitals reviewed.  Assessment & Plan:   1: Chronic heart failure with preserved ejection fraction- - NYHA class II - euvolemic today - weighing daily and  Melissa Knox says that her weight has been stable. Reminded to call for an overnight weight gain of >2 pounds or a weekly weight gain of >5 pounds - weight up 10 pounds since Melissa Knox was last here 4 months ago - not adding salt and has been reading food labels. Reviewed the importance of closely following a 2000mg  sodium diet  - has been walking 3 times / week for 30 minutes each time - BNP 04/11/18 was 328.0 - PharmD reconciled medications with the patient - has not received flu vaccine yet for this season  2: HTN- - BP looks good today - saw PCP Humphrey Rolls) 04/18/18 - BMP from 04/28/18 reviewed and shows sodium 144, potassium 3.4, creatinine 1.3 and GFR 50. - follows with nephrology Holley Raring)   3: COPD-  - wears oxygen at 2L around the clock - can use her nebulizer 3 times/ day - saw pulmonologist Humphrey Rolls) 03/23/18  Patient did not bring her medications nor a list. Each  medication was verbally reviewed with the patient and Melissa Knox was encouraged to bring the bottles to every visit to confirm accuracy of list.  Return in 6 months or sooner for any questions/problems before then.

## 2018-05-17 NOTE — Patient Instructions (Signed)
Continue weighing daily and call for an overnight weight gain of > 2 pounds or a weekly weight gain of >5 pounds. 

## 2018-05-18 ENCOUNTER — Encounter: Payer: Self-pay | Admitting: Internal Medicine

## 2018-05-18 ENCOUNTER — Ambulatory Visit (INDEPENDENT_AMBULATORY_CARE_PROVIDER_SITE_OTHER): Payer: Medicare Other | Admitting: Internal Medicine

## 2018-05-18 VITALS — BP 140/70 | HR 93 | Resp 16 | Ht 60.0 in | Wt 145.0 lb

## 2018-05-18 DIAGNOSIS — R768 Other specified abnormal immunological findings in serum: Secondary | ICD-10-CM | POA: Diagnosis not present

## 2018-05-18 DIAGNOSIS — J849 Interstitial pulmonary disease, unspecified: Secondary | ICD-10-CM | POA: Diagnosis not present

## 2018-05-18 DIAGNOSIS — J9611 Chronic respiratory failure with hypoxia: Secondary | ICD-10-CM

## 2018-05-18 DIAGNOSIS — R0602 Shortness of breath: Secondary | ICD-10-CM | POA: Diagnosis not present

## 2018-05-18 DIAGNOSIS — Z114 Encounter for screening for human immunodeficiency virus [HIV]: Secondary | ICD-10-CM

## 2018-05-18 NOTE — Progress Notes (Signed)
[facility] [place], [place] [address]  Pulmonary Sleep Medicine   Office Visit Note  Patient Name: Melissa Knox DOB: October 11, 1950 MRN [id number]  Date of Service: 05/18/2018  Complaints/HPI: Patient is here for follow-up after CT scan of the chest.  She has had numerous admissions to the hospital with diagnosis of COPD exacerbation and interstitial pneumonia.  She had a CT scan done back in August which was significantly abnormal with diffuse interstitial pneumonitis.  She has had chronic cystic changes noted on her CT scans dating back to 2018 and beyond.  She is also been oxygen dependent.  The patient has had a work-up including vasculitis and of note is that she has had a ANCA test which was positive.  She was referred to rheumatology by primary care and they repeated the test which was still positive.  A follow-up CT scan was done on 9 September which I personally reviewed and actually shows significant improvement in interstitial pneumonitis however she had a right lower lobe area that was read as being possible suspicious for carcinoma.  Should it should be noted that around that area she also has significantly large bullous disease.  The issue with the ankle however has not been clearly addressed.  She has significant interstitial lung disease she has not had any issues with hemoptysis.  Certainly the previous films could also represent diffuse alveolar hemorrhage which is less likely.  She has not had any complaints of sinus problems.  She has not had any nosebleeds.  She has not had any chest pain.  She does have renal issues and is followed by nephrology for this.  She states that she is not on dialysis.  ROS  General: (-) fever, (-) chills, (-) night sweats, (-) weakness Skin: (-) rashes, (-) itching,. Eyes: (-) visual changes, (-) redness, (-) itching. Nose and Sinuses: (-) nasal stuffiness or itchiness, (-) postnasal drip, (-) nosebleeds, (-) sinus  trouble. Mouth and Throat: (-) sore throat, (-) hoarseness. Neck: (-) swollen glands, (-) enlarged thyroid, (-) neck pain. Respiratory: + cough, (-) bloody sputum, + shortness of breath, + wheezing. Cardiovascular: - ankle swelling, (-) chest pain. Lymphatic: (-) lymph node enlargement. Neurologic: (-) numbness, (-) tingling. Psychiatric: (-) anxiety, (-) depression   Current Medication: Outpatient Encounter Medications as of 05/18/2018  Medication Sig  . albuterol (PROVENTIL) (2.5 MG/3ML) 0.083% nebulizer solution Take 2.5 mg by nebulization every 6 (six) hours as needed for wheezing or shortness of breath.  Marland Kitchen amLODipine (NORVASC) 10 MG tablet Take 1 tablet (10 mg total) by mouth daily.  Marland Kitchen aspirin EC 81 MG tablet Take 81 mg by mouth daily.   Marland Kitchen atorvastatin (LIPITOR) 10 MG tablet Take 1 tablet (10 mg total) by mouth daily at 6 PM.  . azithromycin (ZITHROMAX) 250 MG tablet Take one tab po once daily  . budesonide-formoterol (SYMBICORT) 160-4.5 MCG/ACT inhaler Inhale 2 puffs into the lungs 2 (two) times daily.  . carvedilol (COREG) 6.25 MG tablet Take 6.25 mg by mouth 2 (two) times daily.  Marland Kitchen docusate sodium (COLACE) 100 MG capsule Take 1 capsule (100 mg total) by mouth 2 (two) times daily as needed for mild constipation.  . furosemide (LASIX) 20 MG tablet Take 1 tablet (20 mg total) by mouth daily.  . hydrALAZINE (APRESOLINE) 50 MG tablet Take 50 mg by mouth 3 (three) times daily.   Marland Kitchen levothyroxine (SYNTHROID, LEVOTHROID) 50 MCG tablet Take 1 tablet (50 mcg total) by mouth daily before breakfast.  .  loratadine (CLARITIN) 10 MG tablet Take 1 tablet (10 mg total) by mouth daily. (Patient taking differently: Take 10 mg by mouth daily as needed. )  . losartan (COZAAR) 100 MG tablet Take 100 mg by mouth daily.  . meclizine (ANTIVERT) 12.5 MG tablet Take 12.5 mg by mouth 3 (three) times daily as needed for dizziness.   . mirtazapine (REMERON) 15 MG tablet Take 0.5 tablets (7.5 mg total) by mouth at  bedtime.  . OXYGEN Inhale into the lungs. 2 litre  . pantoprazole (PROTONIX) 40 MG tablet Take 1 tablet (40 mg total) by mouth 2 (two) times daily.  . sucralfate (CARAFATE) 1 g tablet Take 1 tablet (1 g total) by mouth 4 (four) times daily -  with meals and at bedtime.   No facility-administered encounter medications on file as of 05/18/2018.     Surgical History: Past Surgical History:  Procedure Laterality Date  . GALLBLADDER SURGERY  2012    Medical History: Past Medical History:  Diagnosis Date  . Atherosclerotic heart disease 06/12/2015  . CHF (congestive heart failure) ([place])   . Chronic congestive heart failure with left ventricular diastolic dysfunction ([place]) 04/12/2018  . CKD (chronic kidney disease) stage 2, GFR 60-89 ml/min 06/12/2015  . COPD (chronic obstructive pulmonary disease) ([place])   . Hypercalcemia 06/12/2015  . Hyperlipidemia 06/12/2015  . Hypertension 06/12/2015  . Hypothyroidism 06/12/2015  . Insomnia 06/12/2015  . Interstitial pneumonia ([place]) 04/12/2018  . 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 Shortness of breath 06/12/2015  . Solitary pulmonary nodule 06/12/2015    Family History: Family History  Problem Relation Age of Onset  . Cancer Mother   . Hypertension Father   . Diabetes Father   . Cancer Father     Social History: Social History   Socioeconomic History  . Marital status: Married    Spouse name: Daryl  . Number of children: 3  . Years of education: 21  . Highest education level: 12th grade  Occupational History  . Not on file  Social Needs  . Financial resource strain: Not hard at all  . Food insecurity:    Worry: Never true    Inability: Never true  . Transportation needs:    Medical: No    Non-medical: No  Tobacco Use  . Smoking status: Former Smoker    Last attempt to quit: 2013    Years since quitting: 6.7  . Smokeless tobacco: Never Used  Substance and Sexual Activity  . Alcohol use: No    Alcohol/week: 0.0  standard drinks  . Drug use: No  . Sexual activity: Yes    Birth control/protection: None  Lifestyle  . Physical activity:    Days per week: 2 days    Minutes per session: 30 min  . Stress: Not at all  Relationships  . Social connections:    Talks on phone: Twice a week    Gets together: Once a week    Attends religious service: 1 to 4 times per year    Active member of club or organization: No    Attends meetings of clubs or organizations: Never    Relationship status: Married  . Intimate partner violence:    Fear of current or ex partner: No    Emotionally abused: No    Physically abused: No    Forced sexual activity: No  Other Topics Concern  . Not on file  Social History Narrative  . Not on file    Vital Signs:  Blood pressure 140/70, pulse 93, resp. rate 16, height 5' (1.524 m), weight 145 lb (65.8 kg), SpO2 90 %.  Examination: General Appearance: The patient is well-developed, well-nourished, and in no distress. Skin: Gross inspection of skin unremarkable. Head: normocephalic, no gross deformities. Eyes: no gross deformities noted. ENT: ears appear grossly normal no exudates. Neck: Supple. No thyromegaly. No LAD. Respiratory: no rhonchi noted at this time. Cardiovascular: Normal S1 and S2 without murmur or rub. Extremities: No cyanosis. pulses are equal. Neurologic: Alert and oriented. No involuntary movements.  LABS: Recent Results (from the past 2160 hour(s))  Comprehensive metabolic panel     Status: Abnormal   Collection Time: 04/11/18  7:51 AM  Result Value Ref Range   Sodium 142 135 - 145 mmol/L   Potassium 3.0 (L) 3.5 - 5.1 mmol/L   Chloride 105 98 - 111 mmol/L   CO2 29 22 - 32 mmol/L   Glucose, Bld 101 (H) 70 - 99 mg/dL   BUN 14 8 - 23 mg/dL   Creatinine, Ser 1.36 (H) 0.44 - 1.00 mg/dL   Calcium 8.4 (L) 8.9 - 10.3 mg/dL   Total Protein 7.8 6.5 - 8.1 g/dL   Albumin 3.3 (L) 3.5 - 5.0 g/dL   AST 16 15 - 41 U/L   ALT 9 0 - 44 U/L   Alkaline  Phosphatase 65 38 - 126 U/L   Total Bilirubin 0.5 0.3 - 1.2 mg/dL   GFR calc non Af Amer 40 (L) >60 mL/min   GFR calc Af Amer 46 (L) >60 mL/min    Comment: (NOTE) The eGFR has been calculated using the CKD EPI equation. This calculation has not been validated in all clinical situations. eGFR's persistently <60 mL/min signify possible Chronic Kidney Disease.    Anion gap 8 5 - 15    Comment: Performed at [facility], [address] [place], [place] [address]  CBC with Differential     Status: Abnormal   Collection Time: 04/11/18  7:51 AM  Result Value Ref Range   WBC 10.3 3.6 - 11.0 K/uL   RBC 2.93 (L) 3.80 - 5.20 MIL/uL   Hemoglobin 9.2 (L) 12.0 - 16.0 g/dL   HCT 27.2 (L) 35.0 - 47.0 %   MCV 93.1 80.0 - 100.0 fL   MCH 31.4 26.0 - 34.0 pg   MCHC 33.8 32.0 - 36.0 g/dL   RDW 17.6 (H) 11.5 - 14.5 %   Platelets 421 150 - 440 K/uL   Neutrophils Relative % 73 %   Neutro Abs 7.5 (H) 1.4 - 6.5 K/uL   Lymphocytes Relative 18 %   Lymphs Abs 1.8 1.0 - 3.6 K/uL   Monocytes Relative 6 %   Monocytes Absolute 0.6 0.2 - 0.9 K/uL   Eosinophils Relative 2 %   Eosinophils Absolute 0.2 0 - 0.7 K/uL   Basophils Relative 1 %   Basophils Absolute 0.1 0 - 0.1 K/uL    Comment: Performed at [facility], [address] [place], [place] [address]  Brain natriuretic peptide     Status: Abnormal   Collection Time: 04/11/18  7:51 AM  Result Value Ref Range   B Natriuretic Peptide 328.0 (H) 0.0 - 100.0 pg/mL    Comment: Performed at [facility], [address] [place], [place] [address]  Urinalysis, Complete w Microscopic     Status: Abnormal   Collection Time: 04/11/18  8:17 AM  Result Value Ref Range   Color, Urine YELLOW (A) YELLOW  APPearance CLEAR (A) CLEAR   Specific Gravity, Urine 1.010 1.005 - 1.030   pH 7.0 5.0 - 8.0   Glucose, UA NEGATIVE NEGATIVE mg/dL   Hgb urine dipstick NEGATIVE NEGATIVE   Bilirubin Urine NEGATIVE NEGATIVE   Ketones, ur NEGATIVE  NEGATIVE mg/dL   Protein, ur NEGATIVE NEGATIVE mg/dL   Nitrite NEGATIVE NEGATIVE   Leukocytes, UA TRACE (A) NEGATIVE   WBC, UA 6-10 0 - 5 WBC/hpf   Bacteria, UA NONE SEEN NONE SEEN   Squamous Epithelial / LPF 6-10 0 - 5    Comment: Performed at [facility], [address], [place], [place] [address]  Type and screen Ordered by PROVIDER DEFAULT     Status: None   Collection Time: 04/11/18  9:16 AM  Result Value Ref Range   ABO/RH(D) A POS    Antibody Screen NEG    Sample Expiration      04/14/2018 Performed at [facility], [address] [place], [place] [address]   Basic metabolic panel     Status: Abnormal   Collection Time: 04/12/18  4:47 AM  Result Value Ref Range   Sodium 141 135 - 145 mmol/L   Potassium 3.7 3.5 - 5.1 mmol/L   Chloride 109 98 - 111 mmol/L   CO2 25 22 - 32 mmol/L   Glucose, Bld 168 (H) 70 - 99 mg/dL   BUN 20 8 - 23 mg/dL   Creatinine, Ser 1.49 (H) 0.44 - 1.00 mg/dL   Calcium 8.7 (L) 8.9 - 10.3 mg/dL   GFR calc non Af Amer 35 (L) >60 mL/min   GFR calc Af Amer 41 (L) >60 mL/min    Comment: (NOTE) The eGFR has been calculated using the CKD EPI equation. This calculation has not been validated in all clinical situations. eGFR's persistently <60 mL/min signify possible Chronic Kidney Disease.    Anion gap 7 5 - 15    Comment: Performed at [facility], [address] [place], [place] [address]  CBC     Status: Abnormal   Collection Time: 04/12/18  4:47 AM  Result Value Ref Range   WBC 5.5 3.6 - 11.0 K/uL   RBC 2.77 (L) 3.80 - 5.20 MIL/uL   Hemoglobin 8.5 (L) 12.0 - 16.0 g/dL   HCT 26.1 (L) 35.0 - 47.0 %   MCV 94.2 80.0 - 100.0 fL   MCH 30.8 26.0 - 34.0 pg   MCHC 32.7 32.0 - 36.0 g/dL   RDW 17.5 (H) 11.5 - 14.5 %   Platelets 394 150 - 440 K/uL    Comment: Performed at [facility], [address] [place], [place] [address]  ANA w/Reflex     Status: Abnormal   Collection Time: 04/12/18  1:14 PM  Result  Value Ref Range   Anti Nuclear Antibody(ANA) Positive (A) Negative    Comment: (NOTE) Performed At: [facility] [place], [place] [id number] Rush Farmer MD [id number]   ANCA titers     Status: Abnormal   Collection Time: 04/12/18  1:14 PM  Result Value Ref Range   C-ANCA <1:20 Neg:<1:20 titer   P-ANCA 1:320 (H) Neg:<1:20 titer    Comment: (NOTE) The presence of positive fluorescence exhibiting P-ANCA or C-ANCA patterns alone is not specific for the diagnosis of Wegener's Granulomatosis (WG) or microscopic polyangiitis. Decisions about treatment should not be based solely on ANCA IFA results.  The International ANCA Group Consensus recommends follow up testing of positive sera with both  PR-3 and MPO-ANCA enzyme immunoassays. As many as 5% serum samples are positive only by EIA. Ref. AM J Clin Pathol 1999;111:507-513.    Atypical P-ANCA titer <1:20 Neg:<1:20 titer    Comment: (NOTE) The atypical pANCA pattern has been observed in a significant percentage of patients with ulcerative colitis, primary sclerosing cholangitis and autoimmune hepatitis. Performed At: [facility] [place], [place] [id number] Rush Farmer MD [id number]   Mpo/pr-3 (anca) antibodies     Status: Abnormal   Collection Time: 04/12/18  1:14 PM  Result Value Ref Range   Myeloperoxidase Abs 22.6 (H) 0.0 - 9.0 U/mL   ANCA Proteinase 3 3.6 (H) 0.0 - 3.5 U/mL    Comment: (NOTE) Performed At: [facility] [address] [place], [place] [id number] Rush Farmer MD PZ:[id number]   Sedimentation rate     Status: Abnormal   Collection Time: 04/12/18  1:14 PM  Result Value Ref Range   Sed Rate 99 (H) 0 - 30 mm/hr    Comment: Performed at [facility], [address] [place], [place] [address]  Glomerular basement membrane antibodies     Status: None   Collection Time: 04/12/18  1:14 PM  Result Value Ref Range   GBM Ab 4 0 - 20 units     Comment: (NOTE)                   Negative                   0 - 20                   Weak Positive             21 - 30                   Moderate to Strong Positive   >30 Performed At: [facility] [place], [place] [id number] Rush Farmer MD [id number]   Mycoplasma pneumoniae antibody, IgM     Status: None   Collection Time: 04/12/18  1:14 PM  Result Value Ref Range   Mycoplasma pneumo IgM <770 0 - 769 U/mL    Comment: (NOTE)                             Negative            <770 Clinically significant amount of M. pneumoniae antibody not detected.                             Low Positive   770 - 69 M. pneumoniae specific IgM presumptively detected.  It is recommended that another sample be collected 1-2 weeks later to assure reactivity.                             Positive            >950 Highly significant amount of M. pneumoniae specific IgM antibody detected. Performed At: [facility] [place], [place] [id number] Rush Farmer MD [id number]   ENA+DNA/DS+Sjorgen's     Status: Abnormal   Collection Time: 04/12/18  1:14 PM  Result Value Ref Range   ds DNA Ab 1 0 - 9 IU/mL    Comment: (NOTE)  Negative      <5                                   Equivocal  5 - 9                                   Positive      >9    Ribonucleic Protein 0.3 0.0 - 0.9 AI   ENA SM Ab Ser-aCnc <0.2 0.0 - 0.9 AI   SSA (Ro) (ENA) Antibody, IgG >8.0 (H) 0.0 - 0.9 AI   SSB (La) (ENA) Antibody, IgG <0.2 0.0 - 0.9 AI   See below: Comment     Comment: (NOTE) Autoantibody                       Disease Association ------------------------------------------------------------                        Condition                  Frequency ---------------------   ------------------------   --------- Antinuclear Antibody,    SLE, mixed connective Direct (ANA-D)           tissue diseases ---------------------    ------------------------   --------- dsDNA                    SLE                        40 - 60% ---------------------   ------------------------   --------- Chromatin                Drug induced SLE                90%                         SLE                        48 - 97% ---------------------   ------------------------   --------- SSA (Ro)                 SLE                        25 - 35%                         Sjogren's Syndrome         40 - 70%                         Neonatal Lupus                 100% ---------------------   ------------------------   --------- SSB (La)                 SLE                              10%  Sjogren's Syndrome              30% ---------------------   -----------------------    --------- Sm (anti-Smith)          SLE                        15 - 30% ---------------------   -----------------------    --------- RNP                      Mixed Connective Tissue                         Disease                         95% (U1 nRNP,                SLE                        30 - 50% anti-ribonucleoprotein)  Polymyositis and/or                         Dermatomyositis                 20% ---------------------   ------------------------   --------- Scl-70 (antiDNA          Scleroderma (diffuse)      20 - 35% topoisomerase)           Crest                           13% ---------------------   ------------------------   --------- Jo-1                     Polymyositis and/or                         Dermatomyositis            20 - 40% ---------------------   ------------------------   --------- Centromere B             Scleroderma -  Crest                         variant                         80% Performed At: [facility] [place], [place] [id number] Rush Farmer MD [id number]   Basic metabolic panel     Status: Abnormal   Collection Time: 04/13/18  5:36 AM  Result Value Ref Range   Sodium 142 135  - 145 mmol/L   Potassium 3.9 3.5 - 5.1 mmol/L   Chloride 111 98 - 111 mmol/L   CO2 25 22 - 32 mmol/L   Glucose, Bld 127 (H) 70 - 99 mg/dL   BUN 30 (H) 8 - 23 mg/dL   Creatinine, Ser 1.56 (H) 0.44 - 1.00 mg/dL   Calcium 8.7 (L) 8.9 - 10.3 mg/dL   GFR calc non Af Amer 34 (L) >60 mL/min   GFR calc Af Amer 39 (L) >60 mL/min    Comment: (NOTE) The eGFR has been calculated using the CKD EPI equation. This calculation has not been validated in all  clinical situations. eGFR's persistently <60 mL/min signify possible Chronic Kidney Disease.    Anion gap 6 5 - 15    Comment: Performed at [facility], [address] [place], [place] [address]  Basic metabolic panel     Status: Abnormal   Collection Time: 04/14/18  3:30 AM  Result Value Ref Range   Sodium 141 135 - 145 mmol/L   Potassium 4.1 3.5 - 5.1 mmol/L   Chloride 110 98 - 111 mmol/L   CO2 24 22 - 32 mmol/L   Glucose, Bld 126 (H) 70 - 99 mg/dL   BUN 36 (H) 8 - 23 mg/dL   Creatinine, Ser 1.38 (H) 0.44 - 1.00 mg/dL   Calcium 8.7 (L) 8.9 - 10.3 mg/dL   GFR calc non Af Amer 39 (L) >60 mL/min   GFR calc Af Amer 45 (L) >60 mL/min    Comment: (NOTE) The eGFR has been calculated using the CKD EPI equation. This calculation has not been validated in all clinical situations. eGFR's persistently <60 mL/min signify possible Chronic Kidney Disease.    Anion gap 7 5 - 15    Comment: Performed at [facility], [address] [place], [place] [address]    Radiology: Ct Chest High Resolution  Result Date: 05/08/2018 CLINICAL DATA:  Worsening shortness of breath on exertion for 1 year. EXAM: CT CHEST WITHOUT CONTRAST TECHNIQUE: Multidetector CT imaging of the chest was performed following the standard protocol without intravenous contrast. High resolution imaging of the lungs, as well as inspiratory and expiratory imaging, was performed. COMPARISON:  04/11/2018 and additional CT chest examinations dating back to 08/24/2012.  FINDINGS: Cardiovascular: Atherosclerotic calcification of the arterial vasculature, including coronary arteries and aortic valve. Heart is enlarged. No pericardial effusion. Mediastinum/Nodes: Mediastinal lymph nodes measure up to 1.5 cm in the low right paratracheal station, similar. Hilar regions are difficult to evaluate without IV contrast. No axillary adenopathy. Fluid is seen in the esophagus. Lungs/Pleura: Bullous centrilobular and paraseptal emphysema. Consolidation and ground-glass in the posterior segment right upper lobe and superior segment right lower lobe are similar to 04/11/2018 but appear rather progressive from 05/28/2017. Basilar predominant traction bronchiectasis/bronchiolectasis. Small right pleural effusion, minimally increased from 04/11/2018. Airway is otherwise unremarkable. Upper Abdomen: Visualized portions of the liver and adrenal glands are unremarkable. Punctate stone in the right kidney. Scarring in the upper pole right kidney. Visualized portions of the kidneys, spleen, pancreas, stomach and bowel are grossly unremarkable. Cholecystectomy. Calcified lymph nodes in the periportal region. Musculoskeletal: No worrisome lytic or sclerotic lesions. IMPRESSION: 1. Patchy area of consolidation and ground-glass in the posterior segment right upper lobe and superior segment right lower lobe, grossly stable from 04/11/2018 but progressive from 05/28/2017. Findings are worrisome for primary bronchogenic carcinoma. 2. Small right pleural effusion, minimally increased from 04/11/2018. 3. 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 4. Aortic atherosclerosis (ICD10-170.0). Coronary artery calcification. 5. Punctate right renal stone. Electronically Signed   By: Lorin Picket M.D.   On: 05/08/2018 09:23    No results found.  Ct Chest High Resolution  Result Date: 05/08/2018 CLINICAL DATA:  Worsening shortness of breath on  exertion for 1 year. EXAM: CT CHEST WITHOUT CONTRAST TECHNIQUE: Multidetector CT imaging of the chest was performed following the standard protocol without intravenous contrast. High resolution imaging of the lungs, as well as inspiratory and expiratory imaging, was performed. COMPARISON:  04/11/2018 and additional CT chest examinations dating back to 08/24/2012. FINDINGS: Cardiovascular: Atherosclerotic calcification of the  arterial vasculature, including coronary arteries and aortic valve. Heart is enlarged. No pericardial effusion. Mediastinum/Nodes: Mediastinal lymph nodes measure up to 1.5 cm in the low right paratracheal station, similar. Hilar regions are difficult to evaluate without IV contrast. No axillary adenopathy. Fluid is seen in the esophagus. Lungs/Pleura: Bullous centrilobular and paraseptal emphysema. Consolidation and ground-glass in the posterior segment right upper lobe and superior segment right lower lobe are similar to 04/11/2018 but appear rather progressive from 05/28/2017. Basilar predominant traction bronchiectasis/bronchiolectasis. Small right pleural effusion, minimally increased from 04/11/2018. Airway is otherwise unremarkable. Upper Abdomen: Visualized portions of the liver and adrenal glands are unremarkable. Punctate stone in the right kidney. Scarring in the upper pole right kidney. Visualized portions of the kidneys, spleen, pancreas, stomach and bowel are grossly unremarkable. Cholecystectomy. Calcified lymph nodes in the periportal region. Musculoskeletal: No worrisome lytic or sclerotic lesions. IMPRESSION: 1. Patchy area of consolidation and ground-glass in the posterior segment right upper lobe and superior segment right lower lobe, grossly stable from 04/11/2018 but progressive from 05/28/2017. Findings are worrisome for primary bronchogenic carcinoma. 2. Small right pleural effusion, minimally increased from 04/11/2018. 3. Severe bullous emphysema (ICD10-J43.9), with  basilar predominant traction bronchiectasis/bronchiolectasis. 4. Aortic atherosclerosis (ICD10-170.0). Coronary artery calcification. 5. Punctate right renal stone. Electronically Signed   By: Lorin Picket M.D.   On: 05/08/2018 09:23      Assessment and Plan: Patient Active Problem List   Diagnosis Date Noted  . Chronic congestive heart failure with left ventricular diastolic dysfunction ([place]) 04/12/2018  . Obstructive chronic bronchitis without exacerbation ([place]) 02/17/2018  . Major depression, chronic 02/17/2018  . On supplemental oxygen therapy 01/21/2018  . Pollen allergies 01/16/2018  . CHF (congestive heart failure) ([place]) 05/28/2017  . Syncope 02/06/2017  . Pancreatic mass 02/06/2017  . Iron deficiency anemia 05/01/2016  . Anemia in chronic renal disease 04/26/2016  . Insomnia 06/12/2015  . CKD (chronic kidney disease) stage 2, GFR 60-89 ml/min 06/12/2015  . Hypercalcemia 06/12/2015  . Hypothyroidism 06/12/2015  . Pulmonary heart disease ([place]) 06/12/2015  . Hyperlipidemia 06/12/2015  . Hypertension 06/12/2015  . Atherosclerotic heart disease 06/12/2015  . Solitary pulmonary nodule 06/12/2015    1. ILD she has diffuse interstitial lung disease with peripheral cystic disease noted along with retraction and scarring down.  The question whether she might have a mass I think might be more a reflection of presence of scarring underneath from her interstitial process.  Overall her groundglass opacities have actually improved since the last CT scan.  I would however like to keep an eye on the changes that are noted on the right side at this time which is near the emphysematous bleb that she has and again I feel that that is probably related to underlying scarring.  In addition she would I think benefit from a secondary opinion from a tertiary care center which I have suggested that she be referred to have also suggested that we get a angiotensin-converting enzyme level 2. ANCA Positive  she was referred to rheumatology for further evaluation of this.  I have reviewed her note they have ordered additional ancillary testing to be done 3. SOB severe shortness of breath secondary to underlying interstitial lung disease along with some emphysematous blebs.  Surprisingly her pulmonary functions were relatively preserved except for the DLCO 4. Chronic respiratory failure with hypoxia she will be continued on oxygen therapy 5. HIV screen also suggested to her that we get an HIV screen for completeness  General Counseling: I have  discussed the findings of the evaluation and examination with Yanelly.  I have also discussed any further diagnostic evaluation thatmay be needed or ordered today. Makaylen verbalizes understanding of the findings of todays visit. We also reviewed her medications today and discussed drug interactions and side effects including but not limited excessive drowsiness and altered mental states. We also discussed that there is always a risk not just to her but also people around her. she has been encouraged to call the office with any questions or concerns that should arise related to todays visit.    Time spent: 66mn  I have personally obtained a history, examined the patient, evaluated laboratory and imaging results, formulated the assessment and plan and placed orders.    SAllyne Gee MD [facility]

## 2018-05-20 ENCOUNTER — Encounter: Payer: Self-pay | Admitting: Internal Medicine

## 2018-05-20 NOTE — Patient Instructions (Signed)
Pulmonary Fibrosis °Pulmonary fibrosis is a type of lung disease that causes scarring. Over time, the scar tissue (fibrosis) builds up in the air sacs of your lungs. This makes it hard for you to breathe. Less oxygen can get into your blood. °Scarring from pulmonary fibrosis gets worse over time. This damage is permanent. Having damaged lungs may make it more likely that you will have heart problems as well. °What are the causes? °Usually, the cause of pulmonary fibrosis is not known (idiopathic pulmonary fibrosis). However, pulmonary fibrosis can be caused by: °· Exposure to occupational and environmental toxins. These include asbestos, silica, and metal dusts. °· Inhaling moldy hay. This can cause an allergic reaction in the lung (farmer's lung) that can lead to pulmonary fibrosis. °· Inhaling toxic fumes. °· Certain medicines. These include drugs used in radiation therapy or used to treat seizures, heart problems, and some infections. °· Autoimmune diseases, such as rheumatoid arthritis, systemic sclerosis, and connective tissue diseases. °· Sarcoidosis. In this disease, areas of inflammatory cells (granulomas) form and most often affect the lungs. °· Genes. Some cases of pulmonary fibrosis may be passed down through families. ° °What increases the risk? °You may be at a higher risk for developing pulmonary fibrosis if: °· You have a family history of the disease. °· You have an autoimmune disease or another condition linked to pulmonary fibrosis. °· You are exposed to certain substances or fumes found in agricultural, farm, construction, or factory work. °· You take certain medicines. ° °What are the signs or symptoms? °Symptoms may include: °· Difficulty breathing that gets worse with activity. °· Dry, hacking cough. °· Rapid, shallow breathing during exercise or while at rest. °· Shortness of breath that gets worse (dyspnea). °· Bluish skin and lips. °· Loss of appetite. °· Loss of strength. °· Weight loss and  fatigue. °· Rounded and enlarged fingertips (clubbing). ° °How is this diagnosed? °Your health care provider may suspect pulmonary fibrosis based on your symptoms and medical history. Diagnosis may include a physical exam. Your health care provider will check for signs that strongly suggest that you have pulmonary fibrosis, such as: °· Blue skin around your fingernails or mouth from reduced oxygen. °· Clubbing around the ends of your fingers. °· A crackling sound when you breathe. ° °Your health care provider may also do tests to confirm the diagnosis. These may include: °· Looking inside your lungs with an instrument (bronchoscopy). °· Imaging studies of your lungs and heart using: °? X-rays. °? CT scan. °? Sound waves (echocardiogram). °· Tests to measure how well you are breathing (pulmonary function tests). °· Exercise testing to see how well your lungs work while you are walking. °· Blood tests. °· A procedure to remove a small piece of lung tissue to examine in a lab (biopsy). ° °How is this treated? °There is no cure for pulmonary fibrosis. Treatments focus on managing symptoms and preventing scarring from getting worse. This can include: °· Medicines. °? You may take steroids to prevent permanent lung changes. Your health care provider may put you on a high dose at first, then on lower dosages for the long term. °? Medicines to suppress your body’s defense (immune) system. These can have serious side effects. °· You may be monitored with X-rays and laboratory work. °· Oxygen therapy may be helpful if oxygen in your blood is low. °· Surgery. In some cases, a lung transplant is an option. ° °Follow these instructions at home: °· Take medicines only as   directed by your health care provider. °· Keep your vaccinations up to date as recommended by your health care provider. °· Do not use any tobacco products, including cigarettes, chewing tobacco, or electronic cigarettes. If you need help quitting, ask your  health care provider. °· Get regular exercise, but do not overexert yourself. Ask your health care provider to suggest some activities that are safe for you to do. Walking and chair exercises can help if you have physical limitations. °· Consider joining a pulmonary rehabilitation program or a support group for people with pulmonary fibrosis. °· Eat small meals often so you do not get too full. Overeating can make breathing trouble worse. °· Maintain a healthy weight. Lose weight if you need to. °· Do breathing exercises as directed by your health care provider. °· Keep all follow-up visits as directed by your health care provider. This is important. °Contact a health care provider if: °· You are not able to be as active as usual. °· You have a long-lasting (chronic) cough. °· You are often short of breath. °· You have a fever or chills. °Get help right away if: °· You have chest pain. °· Your breathing is much worse. °· You cannot take a deep breath. °· You have blue skin around your mouth or fingers. °· You have clubbing of your fingers. °· You cough up mucus that is dark in color. °· You have a lot of headaches. °· You get very confused or sleepy. °This information is not intended to replace advice given to you by your health care provider. Make sure you discuss any questions you have with your health care provider. °Document Released: 11/06/2003 Document Revised: 01/22/2016 Document Reviewed: 01/24/2014 °Elsevier Interactive Patient Education © 2018 [employer] ° °

## 2018-05-22 ENCOUNTER — Encounter: Payer: Self-pay | Admitting: *Deleted

## 2018-05-22 ENCOUNTER — Other Ambulatory Visit: Payer: Self-pay

## 2018-05-22 MED ORDER — PANTOPRAZOLE SODIUM 40 MG PO TBEC: 40.0000 mg | DELAYED_RELEASE_TABLET | Freq: Two times a day (BID) | ORAL | 3 refills | Status: DC

## 2018-05-23 ENCOUNTER — Ambulatory Visit: Admission: RE | Admit: 2018-05-23 | Payer: Medicare Other | Source: Ambulatory Visit | Admitting: Gastroenterology

## 2018-05-23 ENCOUNTER — Encounter: Admission: RE | Payer: Self-pay | Source: Ambulatory Visit

## 2018-05-23 SURGERY — COLONOSCOPY WITH PROPOFOL
Anesthesia: General

## 2018-05-26 ENCOUNTER — Telehealth: Payer: Self-pay

## 2018-05-26 NOTE — Telephone Encounter (Signed)
FAXED [place]

## 2018-06-03 ENCOUNTER — Encounter: Payer: Self-pay | Admitting: Emergency Medicine

## 2018-06-03 ENCOUNTER — Other Ambulatory Visit: Payer: Self-pay

## 2018-06-03 ENCOUNTER — Inpatient Hospital Stay: Payer: Medicare Other

## 2018-06-03 ENCOUNTER — Inpatient Hospital Stay
Admission: EM | Admit: 2018-06-03 | Discharge: 2018-06-08 | DRG: 193 | Disposition: A | Discharge status: Home Health | Payer: Medicare Other | Attending: Internal Medicine | Admitting: Internal Medicine

## 2018-06-03 DIAGNOSIS — Z8249 Family history of ischemic heart disease and other diseases of the circulatory system: Secondary | ICD-10-CM

## 2018-06-03 DIAGNOSIS — R778 Other specified abnormalities of plasma proteins: Secondary | ICD-10-CM

## 2018-06-03 DIAGNOSIS — I129 Hypertensive chronic kidney disease with stage 1 through stage 4 chronic kidney disease, or unspecified chronic kidney disease: Secondary | ICD-10-CM | POA: Diagnosis not present

## 2018-06-03 DIAGNOSIS — I10 Essential (primary) hypertension: Secondary | ICD-10-CM | POA: Diagnosis not present

## 2018-06-03 DIAGNOSIS — N179 Acute kidney failure, unspecified: Secondary | ICD-10-CM | POA: Diagnosis present

## 2018-06-03 DIAGNOSIS — Z87891 Personal history of nicotine dependence: Secondary | ICD-10-CM | POA: Diagnosis not present

## 2018-06-03 DIAGNOSIS — N183 Chronic kidney disease, stage 3 (moderate): Secondary | ICD-10-CM | POA: Diagnosis present

## 2018-06-03 DIAGNOSIS — J440 Chronic obstructive pulmonary disease with acute lower respiratory infection: Secondary | ICD-10-CM | POA: Diagnosis present

## 2018-06-03 DIAGNOSIS — E785 Hyperlipidemia, unspecified: Secondary | ICD-10-CM | POA: Diagnosis present

## 2018-06-03 DIAGNOSIS — I272 Pulmonary hypertension, unspecified: Secondary | ICD-10-CM | POA: Diagnosis present

## 2018-06-03 DIAGNOSIS — E039 Hypothyroidism, unspecified: Secondary | ICD-10-CM | POA: Diagnosis present

## 2018-06-03 DIAGNOSIS — D638 Anemia in other chronic diseases classified elsewhere: Secondary | ICD-10-CM | POA: Diagnosis present

## 2018-06-03 DIAGNOSIS — I2510 Atherosclerotic heart disease of native coronary artery without angina pectoris: Secondary | ICD-10-CM | POA: Diagnosis present

## 2018-06-03 DIAGNOSIS — T502X5A Adverse effect of carbonic-anhydrase inhibitors, benzothiadiazides and other diuretics, initial encounter: Secondary | ICD-10-CM | POA: Diagnosis present

## 2018-06-03 DIAGNOSIS — D649 Anemia, unspecified: Secondary | ICD-10-CM

## 2018-06-03 DIAGNOSIS — G4700 Insomnia, unspecified: Secondary | ICD-10-CM | POA: Diagnosis present

## 2018-06-03 DIAGNOSIS — I5032 Chronic diastolic (congestive) heart failure: Secondary | ICD-10-CM | POA: Diagnosis present

## 2018-06-03 DIAGNOSIS — Z7951 Long term (current) use of inhaled steroids: Secondary | ICD-10-CM

## 2018-06-03 DIAGNOSIS — Z79899 Other long term (current) drug therapy: Secondary | ICD-10-CM

## 2018-06-03 DIAGNOSIS — J9601 Acute respiratory failure with hypoxia: Secondary | ICD-10-CM | POA: Diagnosis not present

## 2018-06-03 DIAGNOSIS — Z9981 Dependence on supplemental oxygen: Secondary | ICD-10-CM | POA: Diagnosis not present

## 2018-06-03 DIAGNOSIS — R7989 Other specified abnormal findings of blood chemistry: Secondary | ICD-10-CM | POA: Diagnosis not present

## 2018-06-03 DIAGNOSIS — J9621 Acute and chronic respiratory failure with hypoxia: Secondary | ICD-10-CM | POA: Diagnosis not present

## 2018-06-03 DIAGNOSIS — Y95 Nosocomial condition: Secondary | ICD-10-CM | POA: Diagnosis present

## 2018-06-03 DIAGNOSIS — J189 Pneumonia, unspecified organism: Secondary | ICD-10-CM | POA: Diagnosis not present

## 2018-06-03 DIAGNOSIS — R000 Tachycardia, unspecified: Secondary | ICD-10-CM | POA: Diagnosis not present

## 2018-06-03 DIAGNOSIS — J849 Interstitial pulmonary disease, unspecified: Secondary | ICD-10-CM | POA: Diagnosis not present

## 2018-06-03 DIAGNOSIS — E860 Dehydration: Secondary | ICD-10-CM | POA: Diagnosis present

## 2018-06-03 DIAGNOSIS — R0602 Shortness of breath: Secondary | ICD-10-CM | POA: Diagnosis not present

## 2018-06-03 DIAGNOSIS — Z7982 Long term (current) use of aspirin: Secondary | ICD-10-CM | POA: Diagnosis not present

## 2018-06-03 DIAGNOSIS — N189 Chronic kidney disease, unspecified: Secondary | ICD-10-CM

## 2018-06-03 DIAGNOSIS — R911 Solitary pulmonary nodule: Secondary | ICD-10-CM | POA: Diagnosis present

## 2018-06-03 DIAGNOSIS — I130 Hypertensive heart and chronic kidney disease with heart failure and stage 1 through stage 4 chronic kidney disease, or unspecified chronic kidney disease: Secondary | ICD-10-CM | POA: Diagnosis present

## 2018-06-03 DIAGNOSIS — J90 Pleural effusion, not elsewhere classified: Secondary | ICD-10-CM

## 2018-06-03 LAB — CBC WITH DIFFERENTIAL/PLATELET
Basophils Absolute: 0.1 10*3/uL (ref 0–0.1)
Basophils Relative: 1 %
EOS ABS: 0.5 10*3/uL (ref 0–0.7)
EOS PCT: 4 %
HCT: 23.3 % — ABNORMAL LOW (ref 35.0–47.0)
HEMOGLOBIN: 6.9 g/dL — AB (ref 12.0–16.0)
LYMPHS PCT: 20 %
Lymphs Abs: 2.7 10*3/uL (ref 1.0–3.6)
MCH: 25.9 pg — ABNORMAL LOW (ref 26.0–34.0)
MCHC: 29.6 g/dL — ABNORMAL LOW (ref 32.0–36.0)
MCV: 87.5 fL (ref 80.0–100.0)
Monocytes Absolute: 0.8 10*3/uL (ref 0.2–0.9)
Monocytes Relative: 6 %
NEUTROS PCT: 69 %
Neutro Abs: 9.1 10*3/uL — ABNORMAL HIGH (ref 1.4–6.5)
PLATELETS: 710 10*3/uL — AB (ref 150–440)
RBC: 2.66 MIL/uL — AB (ref 3.80–5.20)
RDW: 19.2 % — ABNORMAL HIGH (ref 11.5–14.5)
WBC: 13.3 10*3/uL — AB (ref 3.6–11.0)

## 2018-06-03 LAB — COMPREHENSIVE METABOLIC PANEL
ALK PHOS: 64 U/L (ref 38–126)
ALT: 9 U/L (ref 0–44)
ANION GAP: 13 (ref 5–15)
AST: 18 U/L (ref 15–41)
Albumin: 3.1 g/dL — ABNORMAL LOW (ref 3.5–5.0)
BUN: 25 mg/dL — ABNORMAL HIGH (ref 8–23)
CALCIUM: 8.7 mg/dL — AB (ref 8.9–10.3)
CO2: 21 mmol/L — AB (ref 22–32)
Chloride: 106 mmol/L (ref 98–111)
Creatinine, Ser: 1.64 mg/dL — ABNORMAL HIGH (ref 0.44–1.00)
GFR calc non Af Amer: 31 mL/min — ABNORMAL LOW (ref >60)
GFR, EST AFRICAN AMERICAN: 36 mL/min — AB (ref >60)
Glucose, Bld: 178 mg/dL — ABNORMAL HIGH (ref 70–99)
Potassium: 3.7 mmol/L (ref 3.5–5.1)
SODIUM: 140 mmol/L (ref 135–145)
Total Bilirubin: 0.4 mg/dL (ref 0.3–1.2)
Total Protein: 7.9 g/dL (ref 6.5–8.1)

## 2018-06-03 LAB — BRAIN NATRIURETIC PEPTIDE: B NATRIURETIC PEPTIDE 5: 369 pg/mL — AB (ref 0.0–100.0)

## 2018-06-03 LAB — TROPONIN I
Troponin I: 0.06 ng/mL (ref <0.03)
Troponin I: 0.05 ng/mL (ref <0.03)
Troponin I: 0.03 ng/mL (ref <0.03)
Troponin I: <0.03 ng/mL (ref <0.03)

## 2018-06-03 LAB — HEMOGLOBIN: HEMOGLOBIN: 7.9 g/dL — AB (ref 12.0–16.0)

## 2018-06-03 LAB — MAGNESIUM: Magnesium: 1.7 mg/dL (ref 1.7–2.4)

## 2018-06-03 LAB — MRSA PCR SCREENING: MRSA BY PCR: NEGATIVE

## 2018-06-03 LAB — STREP PNEUMONIAE URINARY ANTIGEN: Strep Pneumo Urinary Antigen: NEGATIVE

## 2018-06-03 LAB — TSH: TSH: 0.527 u[IU]/mL (ref 0.350–4.500)

## 2018-06-03 LAB — URINALYSIS, ROUTINE W REFLEX MICROSCOPIC
Bilirubin Urine: NEGATIVE
Glucose, UA: NEGATIVE mg/dL
Hgb urine dipstick: NEGATIVE
Ketones, ur: NEGATIVE mg/dL
Leukocytes, UA: NEGATIVE
Nitrite: NEGATIVE
Protein, ur: NEGATIVE mg/dL
Specific Gravity, Urine: 1.005 (ref 1.005–1.030)
pH: 6 (ref 5.0–8.0)

## 2018-06-03 LAB — PREPARE RBC (CROSSMATCH)

## 2018-06-03 LAB — PROCALCITONIN: Procalcitonin: 0.19 ng/mL

## 2018-06-03 LAB — GLUCOSE, CAPILLARY: Glucose-Capillary: 98 mg/dL (ref 70–99)

## 2018-06-03 LAB — LIPASE, BLOOD: LIPASE: 31 U/L (ref 11–51)

## 2018-06-03 MED ORDER — CARVEDILOL 6.25 MG PO TABS
6.2500 mg | ORAL_TABLET | Freq: Two times a day (BID) | ORAL | Status: DC
  Filled 2018-06-03 (×3): qty 1

## 2018-06-03 MED ORDER — DOCUSATE SODIUM 100 MG PO CAPS: 100.0000 mg | ORAL_CAPSULE | Freq: Two times a day (BID) | ORAL | Status: DC | PRN

## 2018-06-03 MED ORDER — SODIUM CHLORIDE 0.9 % IV SOLN
1.0000 g | Freq: Two times a day (BID) | INTRAVENOUS | Status: DC
  Administered 2018-06-03 – 2018-06-05 (×5): 1 g via INTRAVENOUS
  Filled 2018-06-03 (×9): qty 1

## 2018-06-03 MED ORDER — INFLUENZA VAC SPLIT HIGH-DOSE 0.5 ML IM SUSY: 0.5000 mL | PREFILLED_SYRINGE | INTRAMUSCULAR | Status: DC | PRN

## 2018-06-03 MED ORDER — LORATADINE 10 MG PO TABS: 10.0000 mg | ORAL_TABLET | Freq: Every day | ORAL | Status: DC | PRN

## 2018-06-03 MED ORDER — SUCRALFATE 1 G PO TABS
1.0000 g | ORAL_TABLET | Freq: Three times a day (TID) | ORAL | Status: DC
  Administered 2018-06-03 – 2018-06-08 (×20): 1 g via ORAL
  Filled 2018-06-03 (×20): qty 1

## 2018-06-03 MED ORDER — ONDANSETRON HCL 4 MG/2ML IJ SOLN: 4.0000 mg | Freq: Four times a day (QID) | INTRAMUSCULAR | Status: DC | PRN

## 2018-06-03 MED ORDER — ASPIRIN EC 81 MG PO TBEC
81.0000 mg | DELAYED_RELEASE_TABLET | Freq: Every day | ORAL | Status: DC
  Administered 2018-06-03 – 2018-06-08 (×6): 81 mg via ORAL
  Filled 2018-06-03 (×6): qty 1

## 2018-06-03 MED ORDER — ONDANSETRON HCL 4 MG PO TABS: 4.0000 mg | ORAL_TABLET | Freq: Four times a day (QID) | ORAL | Status: DC | PRN

## 2018-06-03 MED ORDER — FUROSEMIDE 10 MG/ML IJ SOLN
20.0000 mg | Freq: Once | INTRAMUSCULAR | Status: AC
  Administered 2018-06-03: 20 mg via INTRAVENOUS
  Filled 2018-06-03: qty 2

## 2018-06-03 MED ORDER — FLUTICASONE FUROATE-VILANTEROL 200-25 MCG/INH IN AEPB
1.0000 | INHALATION_SPRAY | Freq: Every day | RESPIRATORY_TRACT | Status: DC
  Filled 2018-06-03 (×2): qty 28

## 2018-06-03 MED ORDER — METHYLPREDNISOLONE SODIUM SUCC 125 MG IJ SOLR
60.0000 mg | Freq: Four times a day (QID) | INTRAMUSCULAR | Status: DC
  Administered 2018-06-03 – 2018-06-05 (×8): 60 mg via INTRAVENOUS
  Filled 2018-06-03 (×8): qty 2

## 2018-06-03 MED ORDER — HEPARIN SODIUM (PORCINE) 5000 UNIT/ML IJ SOLN
5000.0000 [IU] | Freq: Three times a day (TID) | INTRAMUSCULAR | Status: DC
  Administered 2018-06-03 – 2018-06-08 (×16): 5000 [IU] via SUBCUTANEOUS
  Filled 2018-06-03 (×16): qty 1

## 2018-06-03 MED ORDER — MIRTAZAPINE 15 MG PO TABS
7.5000 mg | ORAL_TABLET | Freq: Every day | ORAL | Status: DC
  Administered 2018-06-03 – 2018-06-07 (×5): 7.5 mg via ORAL
  Filled 2018-06-03 (×6): qty 1

## 2018-06-03 MED ORDER — VANCOMYCIN HCL IN DEXTROSE 1-5 GM/200ML-% IV SOLN
1000.0000 mg | INTRAVENOUS | Status: DC
  Administered 2018-06-04: 1000 mg via INTRAVENOUS
  Filled 2018-06-03 (×2): qty 200

## 2018-06-03 MED ORDER — MECLIZINE HCL 12.5 MG PO TABS
12.5000 mg | ORAL_TABLET | Freq: Three times a day (TID) | ORAL | Status: DC | PRN
  Filled 2018-06-03: qty 1

## 2018-06-03 MED ORDER — LEVOTHYROXINE SODIUM 50 MCG PO TABS
50.0000 ug | ORAL_TABLET | Freq: Every day | ORAL | Status: DC
  Administered 2018-06-03 – 2018-06-08 (×6): 50 ug via ORAL
  Filled 2018-06-03 (×6): qty 1

## 2018-06-03 MED ORDER — PANTOPRAZOLE SODIUM 40 MG PO TBEC
40.0000 mg | DELAYED_RELEASE_TABLET | Freq: Two times a day (BID) | ORAL | Status: DC
  Administered 2018-06-03 – 2018-06-08 (×11): 40 mg via ORAL
  Filled 2018-06-03 (×11): qty 1

## 2018-06-03 MED ORDER — LOSARTAN POTASSIUM 50 MG PO TABS
100.0000 mg | ORAL_TABLET | Freq: Every day | ORAL | Status: DC
  Filled 2018-06-03: qty 2

## 2018-06-03 MED ORDER — IPRATROPIUM-ALBUTEROL 0.5-2.5 (3) MG/3ML IN SOLN
3.0000 mL | Freq: Four times a day (QID) | RESPIRATORY_TRACT | Status: DC
  Administered 2018-06-03 – 2018-06-06 (×12): 3 mL via RESPIRATORY_TRACT
  Filled 2018-06-03 (×12): qty 3

## 2018-06-03 MED ORDER — ALBUTEROL SULFATE (2.5 MG/3ML) 0.083% IN NEBU: 2.5000 mg | INHALATION_SOLUTION | Freq: Four times a day (QID) | RESPIRATORY_TRACT | Status: DC | PRN

## 2018-06-03 MED ORDER — HYDRALAZINE HCL 50 MG PO TABS
50.0000 mg | ORAL_TABLET | Freq: Three times a day (TID) | ORAL | Status: DC
  Administered 2018-06-05: 50 mg via ORAL
  Filled 2018-06-03 (×2): qty 1

## 2018-06-03 MED ORDER — ACETAMINOPHEN 650 MG RE SUPP: 650.0000 mg | Freq: Four times a day (QID) | RECTAL | Status: DC | PRN

## 2018-06-03 MED ORDER — ACETAMINOPHEN 500 MG PO TABS
1000.0000 mg | ORAL_TABLET | Freq: Once | ORAL | Status: AC
  Administered 2018-06-03: 1000 mg via ORAL

## 2018-06-03 MED ORDER — ATORVASTATIN CALCIUM 10 MG PO TABS
10.0000 mg | ORAL_TABLET | Freq: Every day | ORAL | Status: DC
  Administered 2018-06-03 – 2018-06-07 (×5): 10 mg via ORAL
  Filled 2018-06-03 (×5): qty 1

## 2018-06-03 MED ORDER — ACETAMINOPHEN 325 MG PO TABS
650.0000 mg | ORAL_TABLET | Freq: Four times a day (QID) | ORAL | Status: DC | PRN
  Administered 2018-06-05: 650 mg via ORAL
  Filled 2018-06-03: qty 2

## 2018-06-03 MED ORDER — FUROSEMIDE 20 MG PO TABS
20.0000 mg | ORAL_TABLET | Freq: Every day | ORAL | Status: DC
  Administered 2018-06-04 – 2018-06-08 (×5): 20 mg via ORAL
  Filled 2018-06-03 (×5): qty 1

## 2018-06-03 MED ORDER — PNEUMOCOCCAL VAC POLYVALENT 25 MCG/0.5ML IJ INJ: 0.5000 mL | INJECTION | INTRAMUSCULAR | Status: DC | PRN

## 2018-06-03 MED ORDER — AMLODIPINE BESYLATE 10 MG PO TABS
10.0000 mg | ORAL_TABLET | Freq: Every day | ORAL | Status: DC
  Administered 2018-06-05 – 2018-06-08 (×4): 10 mg via ORAL
  Filled 2018-06-03 (×5): qty 1

## 2018-06-03 MED ORDER — ACETAMINOPHEN 500 MG PO TABS
ORAL_TABLET | ORAL | Status: AC
  Administered 2018-06-03: 1000 mg via ORAL
  Filled 2018-06-03: qty 2

## 2018-06-03 MED ORDER — SODIUM CHLORIDE 0.9% IV SOLUTION: Freq: Once | INTRAVENOUS | Status: DC

## 2018-06-03 MED ORDER — ALBUTEROL SULFATE (2.5 MG/3ML) 0.083% IN NEBU: 2.5000 mg | INHALATION_SOLUTION | Freq: Four times a day (QID) | RESPIRATORY_TRACT | Status: DC

## 2018-06-03 MED ORDER — VANCOMYCIN HCL IN DEXTROSE 1-5 GM/200ML-% IV SOLN
1000.0000 mg | Freq: Once | INTRAVENOUS | Status: AC
  Administered 2018-06-03: 1000 mg via INTRAVENOUS
  Filled 2018-06-03 (×2): qty 200

## 2018-06-03 NOTE — Progress Notes (Signed)
Increased fio2 to 70% due to sats dropping into the upper 70's.

## 2018-06-03 NOTE — Progress Notes (Signed)
Decreased to 80%

## 2018-06-03 NOTE — Progress Notes (Signed)
Chaplain followed up on the patient to determine any pastoral care needs. The patient's husband had been notified and neither seems particularly distressed by the patient's change in health. Chaplain offered to return as needed. Chaplain blessed the patient before departure.   06/03/18 1100  Clinical Encounter Type  Visited With Patient  Visit Type Follow-up

## 2018-06-03 NOTE — ED Notes (Signed)
VBG given to RT

## 2018-06-03 NOTE — Progress Notes (Signed)
   06/03/18 0900  Clinical Encounter Type  Visited With Patient  Visit Type Follow-up (Rapid Response)  Referral From Nurse  Recommendations Follow-up, as requested.  Spiritual Encounters  Spiritual Needs Prayer   Chaplain responded to Rapid Response page to the patient's room. Patient was awake and communicating with the care team. No family members were present. Chaplain did not discern any staff needs for pastoral care. Chaplain provided pastoral presence and energetic prayer for the care team and patient. Chaplain will follow-up with the patient after transport to ICU.

## 2018-06-03 NOTE — ED Notes (Signed)
CRITICAL LAB: TROPONIN is 0.06, [facility], Dr. Karma Greaser notified, orders received

## 2018-06-03 NOTE — Progress Notes (Signed)
Bipap at bedside. Pt tol HFNC well at this time. Pt aware that Bipap is available if she feels any respiratory distress. Pt does not want Bipap at this time.

## 2018-06-03 NOTE — Consult Note (Signed)
Reason for Consult: Respiratory failure Referring Physician: [name] is an 67 y.o. female.  HPI: Melissa Knox is a very pleasant 67 year old female with a past medical history remarkable for hypertension, hyperlipidemia, hypothyroidism, chronic renal insufficiency, congestive heart failure, coronary artery disease, combination bullous lung disease with fibrosis presents with increasing shortness of breath and desaturation.  Patient usually uses 2 L of oxygen at home.  Upon arrival to the emergency department she was found to be somnolent and hypoxemic.  She was placed initially on nonrebreather and subsequently admitted to the floor.  Unfortunately she developed worsening shortness of breath and was transferred from floor to the intensive care unit on noninvasive ventilation.  Presently she is resting comfortably, awake, alert, communicating states that she feels much better.  Denies any recent fever, chills, night sweats or sputum production  Past Medical History:  Diagnosis Date  . Atherosclerotic heart disease 06/12/2015  . CHF (congestive heart failure) ([place])   . Chronic congestive heart failure with left ventricular diastolic dysfunction ([place]) 04/12/2018  . CKD (chronic kidney disease) stage 2, GFR 60-89 ml/min 06/12/2015  . COPD (chronic obstructive pulmonary disease) ([place])   . Hypercalcemia 06/12/2015  . Hyperlipidemia 06/12/2015  . Hypertension 06/12/2015  . Hypothyroidism 06/12/2015  . Insomnia 06/12/2015  . Interstitial pneumonia ([place]) 04/12/2018  . 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 Shortness of breath 06/12/2015  . Solitary pulmonary nodule 06/12/2015    Past Surgical History:  Procedure Laterality Date  . GALLBLADDER SURGERY  2012    Family History  Problem Relation Age of Onset  . Cancer Mother   . Hypertension Father   . Diabetes Father   . Cancer Father     Social History:  reports that she quit smoking about 6 years ago. She has never used  smokeless tobacco. She reports that she does not drink alcohol or use drugs.  Allergies: No Known Allergies  Medications: I have reviewed the patient's current medications.  Results for orders placed or performed during the hospital encounter of 06/03/18 (from the past 48 hour(s))  Comprehensive metabolic panel     Status: Abnormal   Collection Time: 06/03/18  4:05 AM  Result Value Ref Range   Sodium 140 135 - 145 mmol/L   Potassium 3.7 3.5 - 5.1 mmol/L   Chloride 106 98 - 111 mmol/L   CO2 21 (L) 22 - 32 mmol/L   Glucose, Bld 178 (H) 70 - 99 mg/dL   BUN 25 (H) 8 - 23 mg/dL   Creatinine, Ser 1.64 (H) 0.44 - 1.00 mg/dL   Calcium 8.7 (L) 8.9 - 10.3 mg/dL   Total Protein 7.9 6.5 - 8.1 g/dL   Albumin 3.1 (L) 3.5 - 5.0 g/dL   AST 18 15 - 41 U/L   ALT 9 0 - 44 U/L   Alkaline Phosphatase 64 38 - 126 U/L   Total Bilirubin 0.4 0.3 - 1.2 mg/dL   GFR calc non Af Amer 31 (L) >60 mL/min   GFR calc Af Amer 36 (L) >60 mL/min    Comment: (NOTE) The eGFR has been calculated using the CKD EPI equation. This calculation has not been validated in all clinical situations. eGFR's persistently <60 mL/min signify possible Chronic Kidney Disease.    Anion gap 13 5 - 15    Comment: Performed at [facility], [address] [place], [place] [address]  Lipase, blood     Status: None   Collection Time: 06/03/18  4:05 AM  Result Value Ref Range   Lipase 31 11 - 51 U/L    Comment: Performed at [facility], [address] [place], [place] [address]  Brain natriuretic peptide     Status: Abnormal   Collection Time: 06/03/18  4:05 AM  Result Value Ref Range   B Natriuretic Peptide 369.0 (H) 0.0 - 100.0 pg/mL    Comment: Performed at [facility], [address] [place], [place] [address]  Troponin I     Status: Abnormal   Collection Time: 06/03/18  4:05 AM  Result Value Ref Range   Troponin I 0.06 (HH) <0.03 ng/mL    Comment: CRITICAL RESULT CALLED TO, READ BACK BY AND  VERIFIED WITH NOEL WEBSTER ON 06/03/18 AT [facility] Performed at [facility], [address] [place], [place] [address]   CBC with Differential     Status: Abnormal   Collection Time: 06/03/18  4:05 AM  Result Value Ref Range   WBC 13.3 (H) 3.6 - 11.0 K/uL   RBC 2.66 (L) 3.80 - 5.20 MIL/uL   Hemoglobin 6.9 (L) 12.0 - 16.0 g/dL   HCT 23.3 (L) 35.0 - 47.0 %   MCV 87.5 80.0 - 100.0 fL   MCH 25.9 (L) 26.0 - 34.0 pg   MCHC 29.6 (L) 32.0 - 36.0 g/dL   RDW 19.2 (H) 11.5 - 14.5 %   Platelets 710 (H) 150 - 440 K/uL   Neutrophils Relative % 69 %   Neutro Abs 9.1 (H) 1.4 - 6.5 K/uL   Lymphocytes Relative 20 %   Lymphs Abs 2.7 1.0 - 3.6 K/uL   Monocytes Relative 6 %   Monocytes Absolute 0.8 0.2 - 0.9 K/uL   Eosinophils Relative 4 %   Eosinophils Absolute 0.5 0 - 0.7 K/uL   Basophils Relative 1 %   Basophils Absolute 0.1 0 - 0.1 K/uL    Comment: Performed at [facility], [address] [place], [place] [address]  Blood gas, venous     Status: Abnormal (Preliminary result)   Collection Time: 06/03/18  4:12 AM  Result Value Ref Range   FIO2 100.00    Delivery systems NON-REBREATHER OXYGEN MASK    pH, Ven 7.36 7.250 - 7.430   pCO2, Ven 41 (L) 44.0 - 60.0 mmHg   pO2, Ven PENDING 32.0 - 45.0 mmHg   Bicarbonate 23.2 20.0 - 28.0 mmol/L   Acid-base deficit 2.1 (H) 0.0 - 2.0 mmol/L   O2 Saturation PENDING %   Patient temperature 37.0    Collection site VENOUS    Sample type VENOUS     Comment: Performed at [facility], [address], [place], [place] [address]  Prepare RBC     Status: None (Preliminary result)   Collection Time: 06/03/18  9:59 AM  Result Value Ref Range   Order Confirmation PENDING   Glucose, capillary     Status: None   Collection Time: 06/03/18 10:02 AM  Result Value Ref Range   Glucose-Capillary 98 70 - 99 mg/dL  Type and screen [place]     Status: None (Preliminary result)   Collection Time: 06/03/18 10:29 AM   Result Value Ref Range   ABO/RH(D) PENDING    Antibody Screen PENDING    Sample Expiration      06/06/2018 Performed at [facility], [address], [place], [place] [address]     Dg Chest Port 1 View  Result Date: 06/03/2018 CLINICAL DATA:  67  y/o  F; shortness of breath and hypoxemia. EXAM: PORTABLE CHEST 1 VIEW COMPARISON:  05/08/2018 CT chest. 04/11/2018 chest radiograph. FINDINGS: Stable cardiac silhouette given projection and technique. Calcific aortic atherosclerosis. Stable medial aspect of right upper lobe consolidation. Stable findings of emphysema and coarse reticulation with basilar predominance compatible with fibrosis. Interval increase in right-sided pleural effusion. Right upper quadrant cholecystectomy clips. No acute osseous abnormality identified. IMPRESSION: Interval increase in right-sided pleural effusion. Stable right upper lobe consolidation. Stable findings of emphysema, fibrosis, aortic atherosclerosis. Electronically Signed   By: Kristine Garbe M.D.   On: 06/03/2018 06:10    ROS   Please see HPI, all is negative  Blood pressure (!) 127/55, pulse 77, temperature 98.4 F (36.9 C), temperature source Oral, resp. rate (!) 21, height 5' (1.524 m), weight 66.2 kg, SpO2 100 %.   Physical Exam  Patient is awake, alert, mild respiratory distress on noninvasive ventilation Vital signs: Please see the above listed vital signs HEENT: Trachea is midline, limited oral exam secondary to BiPAP, moderate accessory muscle utilization Cardiovascular: Regular rate and rhythm Pulmonary: Prolonged expiratory phase, expiratory rhonchi and wheezing, inspiratory crackles in the lung bases Abdominal: Positive bowel sounds, soft exam Extremities: No clubbing, cyanosis or edema noted Neurologic: No focal deficits noted  Assessment/Plan:  Respiratory failure.  Patient was started on vancomycin, cefepime, will add Solu-Medrol, albuterol, Atrovent, BiPAP,  budesonide, wean as tolerated heated high flow.   She has had a history and is followed by Dr. Humphrey Rolls in the outpatient setting as her Primary Pulmonologist for interstitial pneumonia, groundglass, fibrosis, cystic/bullous changes.  She has had ANCA positivity and has seen rheumatology in the past she also has a lower lobe nodule that she is being followed in the outpatient setting.  although work-up would be difficult and therapeutic secondary to her severe respiratory limitation.  She denies any hemoptysis  Renal insufficiency.  BUN is 25/creatinine 1.64  Mildly elevated troponin at 0.06 most likely reflects supply demand/strain.  No acute changes noted on EKG  Patient is anemic with a hemoglobin of 6.9, transfuse 1 unit of packed red blood cells  Leukocytosis of 13.3  Luman Holway 06/03/2018, 11:32 AM

## 2018-06-03 NOTE — H&P (Signed)
Melissa Knox is an 67 y.o. female.   Chief Complaint: Altered mental status HPI: The patient with past medical history of hypertension, pulmonary heart disease and chronic kidney disease presents to the emergency department due to somnolence and decreased arousability.  Patient was found to have pulse oximetry of 5% despite supplemental oxygen.  It turns out that her oxygen concentrator as well as portable oxygen tank were depleted.  She was placed on a nonrebreather mask in the emergency department at 100% FiO2 with brought her oxygen saturations up to normal.  The patient reports that she has been using it 5 to 6 L of oxygen all week.  She denies fevers but admits to cough.  She was found to have leukocytosis as well as persistent tachypnea and elevated troponin which prompted the emergency department staff to call the hospitalist service for admission.  Past Medical History:  Diagnosis Date  . Atherosclerotic heart disease 06/12/2015  . CHF (congestive heart failure) ([place])   . Chronic congestive heart failure with left ventricular diastolic dysfunction ([place]) 04/12/2018  . CKD (chronic kidney disease) stage 2, GFR 60-89 ml/min 06/12/2015  . COPD (chronic obstructive pulmonary disease) ([place])   . Hypercalcemia 06/12/2015  . Hyperlipidemia 06/12/2015  . Hypertension 06/12/2015  . Hypothyroidism 06/12/2015  . Insomnia 06/12/2015  . Interstitial pneumonia ([place]) 04/12/2018  . Pulmonary heart disease ([place]) 06/12/2015  . Shortness of breath 06/12/2015  . Solitary pulmonary nodule 06/12/2015    Past Surgical History:  Procedure Laterality Date  . GALLBLADDER SURGERY  2012    Family History  Problem Relation Age of Onset  . Cancer Mother   . Hypertension Father   . Diabetes Father   . Cancer Father    Social History:  reports that she quit smoking about 6 years ago. She has never used smokeless tobacco. She reports that she does not drink alcohol or use drugs.  Allergies: No Known  Allergies  Medications Prior to Admission  Medication Sig Dispense Refill  . albuterol (PROVENTIL) (2.5 MG/3ML) 0.083% nebulizer solution Take 2.5 mg by nebulization every 6 (six) hours as needed for wheezing or shortness of breath.    Marland Kitchen amLODipine (NORVASC) 10 MG tablet Take 1 tablet (10 mg total) by mouth daily. 30 tablet 12  . aspirin EC 81 MG tablet Take 81 mg by mouth daily.     Marland Kitchen atorvastatin (LIPITOR) 10 MG tablet Take 1 tablet (10 mg total) by mouth daily at 6 PM. 30 tablet 3  . budesonide-formoterol (SYMBICORT) 160-4.5 MCG/ACT inhaler Inhale 2 puffs into the lungs 2 (two) times daily.    . carvedilol (COREG) 6.25 MG tablet Take 6.25 mg by mouth 2 (two) times daily.  6  . docusate sodium (COLACE) 100 MG capsule Take 1 capsule (100 mg total) by mouth 2 (two) times daily as needed for mild constipation. 10 capsule 0  . furosemide (LASIX) 20 MG tablet Take 1 tablet (20 mg total) by mouth daily. 30 tablet 3  . hydrALAZINE (APRESOLINE) 50 MG tablet Take 50 mg by mouth 3 (three) times daily.   6  . levothyroxine (SYNTHROID, LEVOTHROID) 50 MCG tablet Take 1 tablet (50 mcg total) by mouth daily before breakfast. 90 tablet 3  . loratadine (CLARITIN) 10 MG tablet Take 1 tablet (10 mg total) by mouth daily. (Patient taking differently: Take 10 mg by mouth daily as needed. ) 30 tablet 11  . losartan (COZAAR) 100 MG tablet Take 100 mg by mouth daily.    . meclizine (  ANTIVERT) 12.5 MG tablet Take 12.5 mg by mouth 3 (three) times daily as needed for dizziness.     . mirtazapine (REMERON) 15 MG tablet Take 0.5 tablets (7.5 mg total) by mouth at bedtime. 30 tablet 3  . pantoprazole (PROTONIX) 40 MG tablet Take 1 tablet (40 mg total) by mouth 2 (two) times daily. 60 tablet 3  . sucralfate (CARAFATE) 1 g tablet Take 1 tablet (1 g total) by mouth 4 (four) times daily -  with meals and at bedtime. 120 tablet 3  . OXYGEN Inhale into the lungs. 2 litre      Results for orders placed or performed during the  hospital encounter of 06/03/18 (from the past 48 hour(s))  Comprehensive metabolic panel     Status: Abnormal   Collection Time: 06/03/18  4:05 AM  Result Value Ref Range   Sodium 140 135 - 145 mmol/L   Potassium 3.7 3.5 - 5.1 mmol/L   Chloride 106 98 - 111 mmol/L   CO2 21 (L) 22 - 32 mmol/L   Glucose, Bld 178 (H) 70 - 99 mg/dL   BUN 25 (H) 8 - 23 mg/dL   Creatinine, Ser 1.64 (H) 0.44 - 1.00 mg/dL   Calcium 8.7 (L) 8.9 - 10.3 mg/dL   Total Protein 7.9 6.5 - 8.1 g/dL   Albumin 3.1 (L) 3.5 - 5.0 g/dL   AST 18 15 - 41 U/L   ALT 9 0 - 44 U/L   Alkaline Phosphatase 64 38 - 126 U/L   Total Bilirubin 0.4 0.3 - 1.2 mg/dL   GFR calc non Af Amer 31 (L) >60 mL/min   GFR calc Af Amer 36 (L) >60 mL/min    Comment: (NOTE) The eGFR has been calculated using the CKD EPI equation. This calculation has not been validated in all clinical situations. eGFR's persistently <60 mL/min signify possible Chronic Kidney Disease.    Anion gap 13 5 - 15    Comment: Performed at [facility], [address] [place], [place] [address]  Lipase, blood     Status: None   Collection Time: 06/03/18  4:05 AM  Result Value Ref Range   Lipase 31 11 - 51 U/L    Comment: Performed at [facility], [address] [place], [place] [address]  Brain natriuretic peptide     Status: Abnormal   Collection Time: 06/03/18  4:05 AM  Result Value Ref Range   B Natriuretic Peptide 369.0 (H) 0.0 - 100.0 pg/mL    Comment: Performed at [facility], [address] [place], [place] [address]  Troponin I     Status: Abnormal   Collection Time: 06/03/18  4:05 AM  Result Value Ref Range   Troponin I 0.06 (HH) <0.03 ng/mL    Comment: CRITICAL RESULT CALLED TO, READ BACK BY AND VERIFIED WITH NOEL WEBSTER ON 06/03/18 AT [facility] Performed at [facility], [address] [place], [place] [address]   CBC with Differential     Status: Abnormal   Collection Time: 06/03/18  4:05 AM  Result  Value Ref Range   WBC 13.3 (H) 3.6 - 11.0 K/uL   RBC 2.66 (L) 3.80 - 5.20 MIL/uL   Hemoglobin 6.9 (L) 12.0 - 16.0 g/dL   HCT 23.3 (L) 35.0 - 47.0 %   MCV 87.5 80.0 - 100.0 fL   MCH 25.9 (L) 26.0 - 34.0 pg   MCHC 29.6 (L) 32.0 - 36.0 g/dL   RDW 19.2 (H)  11.5 - 14.5 %   Platelets 710 (H) 150 - 440 K/uL   Neutrophils Relative % 69 %   Neutro Abs 9.1 (H) 1.4 - 6.5 K/uL   Lymphocytes Relative 20 %   Lymphs Abs 2.7 1.0 - 3.6 K/uL   Monocytes Relative 6 %   Monocytes Absolute 0.8 0.2 - 0.9 K/uL   Eosinophils Relative 4 %   Eosinophils Absolute 0.5 0 - 0.7 K/uL   Basophils Relative 1 %   Basophils Absolute 0.1 0 - 0.1 K/uL    Comment: Performed at [facility], [address] [place], [place] [address]  Blood gas, venous     Status: Abnormal (Preliminary result)   Collection Time: 06/03/18  4:12 AM  Result Value Ref Range   FIO2 100.00    Delivery systems NON-REBREATHER OXYGEN MASK    pH, Ven 7.36 7.250 - 7.430   pCO2, Ven 41 (L) 44.0 - 60.0 mmHg   pO2, Ven PENDING 32.0 - 45.0 mmHg   Bicarbonate 23.2 20.0 - 28.0 mmol/L   Acid-base deficit 2.1 (H) 0.0 - 2.0 mmol/L   O2 Saturation PENDING %   Patient temperature 37.0    Collection site VENOUS    Sample type VENOUS     Comment: Performed at [facility], [address] [place], [place] [address]   Dg Chest Port 1 View  Result Date: 06/03/2018 CLINICAL DATA:  67 y/o  F; shortness of breath and hypoxemia. EXAM: PORTABLE CHEST 1 VIEW COMPARISON:  05/08/2018 CT chest. 04/11/2018 chest radiograph. FINDINGS: Stable cardiac silhouette given projection and technique. Calcific aortic atherosclerosis. Stable medial aspect of right upper lobe consolidation. Stable findings of emphysema and coarse reticulation with basilar predominance compatible with fibrosis. Interval increase in right-sided pleural effusion. Right upper quadrant cholecystectomy clips. No acute osseous abnormality identified. IMPRESSION: Interval increase in  right-sided pleural effusion. Stable right upper lobe consolidation. Stable findings of emphysema, fibrosis, aortic atherosclerosis. Electronically Signed   By: Kristine Garbe M.D.   On: 06/03/2018 06:10    Review of Systems  Constitutional: Negative for chills and fever.  HENT: Negative for sore throat and tinnitus.   Eyes: Negative for blurred vision and redness.  Respiratory: Positive for cough and shortness of breath.   Cardiovascular: Negative for chest pain, palpitations, orthopnea and PND.  Gastrointestinal: Negative for abdominal pain, diarrhea, nausea and vomiting.  Genitourinary: Negative for dysuria, frequency and urgency.  Musculoskeletal: Negative for joint pain and myalgias.  Skin: Negative for rash.       No lesions  Neurological: Negative for speech change, focal weakness and weakness.  Endo/Heme/Allergies: Does not bruise/bleed easily.       No temperature intolerance  Psychiatric/Behavioral: Negative for depression and suicidal ideas.    Blood pressure (!) 150/59, pulse (!) 109, temperature 98.2 F (36.8 C), temperature source Oral, resp. rate (!) 23, height 5' (1.524 m), weight 64.9 kg, SpO2 92 %. Physical Exam  Vitals reviewed. Constitutional: She is oriented to person, place, and time. She appears well-developed and well-nourished. No distress.  HENT:  Head: Normocephalic and atraumatic.  Mouth/Throat: Oropharynx is clear and moist.  Eyes: Pupils are equal, round, and reactive to light. Conjunctivae and EOM are normal. No scleral icterus.  Neck: Normal range of motion. Neck supple. No JVD present. No tracheal deviation present. No thyromegaly present.  Cardiovascular: Normal rate, regular rhythm and normal heart sounds. Exam reveals no gallop and no friction rub.  No murmur heard. Respiratory: She is in respiratory distress.  She has wheezes.  GI: Soft. Bowel sounds are normal. She exhibits no distension. There is no tenderness.  Genitourinary:   Genitourinary Comments: Deferred  Musculoskeletal: Normal range of motion. She exhibits no edema.  Lymphadenopathy:    She has no cervical adenopathy.  Neurological: She is alert and oriented to person, place, and time. No cranial nerve deficit. She exhibits normal muscle tone.  Skin: Skin is warm and dry. No rash noted. No erythema.  Psychiatric: She has a normal mood and affect. Her behavior is normal. Judgment and thought content normal.     Assessment/Plan This is a 67 year old female admitted for pneumonia. 1.  Pneumonia: Healthcare associated; I have given the patient cefepime and vancomycin.  Supplemental oxygen as needed. 2.  Interstitial lung disease: The patient has emphysematous changes as well as fibrosis and groundglass opacities.  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  Hypertension: Inconsistently controlled; continue hydralazine, [place], amlodipine and carvedilol. 4.  Hypothyroidism: Check TSH; continue Synthroid 5.  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  Hyperlipidemia: Continue statin therapy 7.  DVT prophylaxis: Heparin 8.  GI prophylaxis: Pantoprazole per home regimen The patient is a full code.  Time spent on admission orders and patient care approximately 45 minutes  Harrie Foreman, MD 06/03/2018, 7:16 AM

## 2018-06-03 NOTE — Progress Notes (Signed)
Patient arrived to unit and became exerted transferring from stretcher to bed sating at 70% with NRB on 15L. Patient's sats are now 88% with NRB on 15L. HOB elevated 90%. Will continue to monitor and endorse.

## 2018-06-03 NOTE — Significant Event (Signed)
Rapid Response Event Note  Overview: Time Called: 0936 Arrival Time: 0937 Event Type: Respiratory  Initial Focused Assessment: pt laying in bed on non rebreather.    Interventions: Dr Bridgett Larsson in room... tx to stepdown for bipap  Plan of Care (if not transferred):  Event Summary: Name of Physician Notified: Bridgett Larsson at [phone]  Name of Consulting Physician Notified: [place] at 0945  Outcome: Transferred (Comment)(tx for bipap)  Event End Time: [place] A

## 2018-06-03 NOTE — Progress Notes (Signed)
Report received from [facility] ED RN. Spoke to [employer] nurse concerning patient being admitted to med/surg tele unit while sustaining respiratory efforts on NRB. CN called AC who okay this tranfer. Awaiting patient's arrival.

## 2018-06-03 NOTE — Progress Notes (Signed)
Decreased fio2 to 60%

## 2018-06-03 NOTE — ED Notes (Signed)
Pt husband came in to waiting room asking for help getting his wife out of the car. States "I thinks she may be having a stroke". Arrived to car to find female pt laying back in the seat with snoring  respirations and not responding to sternal rub. Charge RN called for room and gurney to move pt to room. Pt noted to have on O2 via [place] and husband reports pt has COPD and CHF. Pt removed from car by Catalina Antigua, RN, Ena Dawley, RN and taken to Rm 11.

## 2018-06-03 NOTE — ED Notes (Signed)
Report finished att, transporting pt

## 2018-06-03 NOTE — Progress Notes (Signed)
[place] [facility] [place] NAME: Melissa Knox    MR#:  [id number]  DATE OF BIRTH:  10/24/1950  SUBJECTIVE:  CHIEF COMPLAINT:   Chief Complaint  Patient presents with  . Altered Mental Status   Pt has worsening shortness of breath and is in respiratory distress and hypoxia at 50%, she is put on nonrebreather oxygen, she needs BiPAP.  She will be transferred to stepdown unit. REVIEW OF SYSTEMS:  Review of Systems  Constitutional: Negative for chills, fever and malaise/fatigue.  HENT: Negative for sore throat.   Eyes: Negative for blurred vision and double vision.  Respiratory: Positive for cough, shortness of breath and wheezing. Negative for hemoptysis, sputum production and stridor.   Cardiovascular: Negative for chest pain, palpitations, orthopnea and leg swelling.  Gastrointestinal: Negative for abdominal pain, blood in stool, diarrhea, melena, nausea and vomiting.  Genitourinary: Negative for dysuria, flank pain and hematuria.  Musculoskeletal: Negative for back pain and joint pain.  Skin: Negative for rash.  Neurological: Negative for dizziness, sensory change, focal weakness, seizures, loss of consciousness, weakness and headaches.  Endo/Heme/Allergies: Negative for polydipsia.  Psychiatric/Behavioral: Negative for depression. The patient is nervous/anxious.     DRUG ALLERGIES:  No Known Allergies VITALS:  Blood pressure (!) 150/59, pulse (!) 109, temperature 98.7 F (37.1 C), temperature source Axillary, resp. rate (!) 23, height 5' (1.524 m), weight 64.9 kg, SpO2 92 %. PHYSICAL EXAMINATION:  Physical Exam  Constitutional: She is oriented to person, place, and time. No distress.  HENT:  Head: Normocephalic.  Mouth/Throat: Oropharynx is clear and moist.  Eyes: Pupils are equal, round, and reactive to light. Conjunctivae and EOM are normal. No scleral icterus.  Neck: Normal range of motion. Neck supple. No JVD present. No tracheal  deviation present.  Cardiovascular: Normal rate, regular rhythm and normal heart sounds. Exam reveals no gallop.  No murmur heard. Pulmonary/Chest: No stridor. She is in respiratory distress. She has no wheezes. She has no rales.  Diminished lung sounds, crackles on the left side.  Abdominal: Soft. Bowel sounds are normal. She exhibits no distension. There is no tenderness. There is no rebound.  Musculoskeletal: Normal range of motion. She exhibits no edema or tenderness.  Neurological: She is alert and oriented to person, place, and time. No cranial nerve deficit.  Skin: No rash noted. She is not diaphoretic. No erythema.   LABORATORY PANEL:  Female CBC Recent Labs  Lab 06/03/18 0405  WBC 13.3*  HGB 6.9*  HCT 23.3*  PLT 710*   ------------------------------------------------------------------------------------------------------------------ Chemistries  Recent Labs  Lab 06/03/18 0405  NA 140  K 3.7  CL 106  CO2 21*  GLUCOSE 178*  BUN 25*  CREATININE 1.64*  CALCIUM 8.7*  AST 18  ALT 9  ALKPHOS 64  BILITOT 0.4   RADIOLOGY:  Dg Chest Port 1 View  Result Date: 06/03/2018 CLINICAL DATA:  67 y/o  F; shortness of breath and hypoxemia. EXAM: PORTABLE CHEST 1 VIEW COMPARISON:  05/08/2018 CT chest. 04/11/2018 chest radiograph. FINDINGS: Stable cardiac silhouette given projection and technique. Calcific aortic atherosclerosis. Stable medial aspect of right upper lobe consolidation. Stable findings of emphysema and coarse reticulation with basilar predominance compatible with fibrosis. Interval increase in right-sided pleural effusion. Right upper quadrant cholecystectomy clips. No acute osseous abnormality identified. IMPRESSION: Interval increase in right-sided pleural effusion. Stable right upper lobe consolidation. Stable findings of emphysema, fibrosis, aortic atherosclerosis. Electronically Signed   By: Kristine Garbe M.D.   On:  06/03/2018 06:10   ASSESSMENT AND PLAN:    This is a 67 year old female admitted for pneumonia. 1.    Acute respiratory failure with hypoxia due to pneumonia: Healthcare associated; Continue nonrebreather now, transfer to stepdown unit and start BiPAP, continue cefepime and vancomycin.  DuoNeb every 6 hours.  Follow-up culture and CBC. Follow-up intensivist.  2.  Interstitial lung disease: The patient has emphysematous changes as well as fibrosis and groundglass opacities.  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  Follow-up intensivist.  3.  Hypertension:controlled; continue hydralazine, Sartain, amlodipine and carvedilol.  4.  Hypothyroidism: Check TSH; continue Synthroid  5.  CHF: Secondary to venous hypertension; continue Lasix per home regimen  6.  Hyperlipidemia: Continue statin therapy  ARF on CKD stage III.  Follow-up BMP while on Lasix.  Anemia of chronic disease.  Hemoglobin decreased to 6.9.  PRBC transfusion and follow-up hemoglobin.  Stool occult.  The patient is in critical condition, I discussed with patient about the patient's current condition and prognosis.  The patient does not want intubation but want any other treatment including CPR. All the records are reviewed and case discussed with Care Management/Social Worker. Management plans discussed with the patient, family and they are in agreement.  CODE STATUS: Partial Code  TOTAL CRITICAL TIME TAKING CARE OF THIS PATIENT: 48 minutes.   More than 50% of the time was spent in counseling/coordination of care: YES  POSSIBLE D/C IN ? DAYS, DEPENDING ON CLINICAL CONDITION.   Demetrios Loll M.D on 06/03/2018 at 9:47 AM  Between 7am to 6pm - Pager - [phone]  After 6pm go to [email or web address] - [facility]

## 2018-06-03 NOTE — ED Notes (Signed)
Pt trailed on 4 lpm Coffee and sats dropped to 62% before resuming NRB

## 2018-06-03 NOTE — Progress Notes (Signed)
Nurse called to room- Per NA patient was drowsy and had "went out" on NA when sitting up. Patient was currently on Venti Mask at 55%- Patient's O2 sats were rechecked and found to be at 52%- RRT called- patient placed back on NRB a2 100%- sats up to high 80's , low 90's- MD Bridgett Larsson to bedside- states to transfer patient to ICU on stepdown status for continuous Bipap

## 2018-06-03 NOTE — Progress Notes (Signed)
Pharmacy Antibiotic Note  Melissa Knox is a 67 y.o. female admitted on 06/03/2018 with pneumonia.  Pharmacy has been consulted for vancomycin dosing.  Plan: DW 53 kg  Vd 37L Lei 0.028 hr-1  T1/2 25 hours Vancomycin 1 gram q 36 hours ordered with stacked dosing. Level before 4th dose. Goal trough 15-20.  Height: 5' (152.4 cm) Weight: 144 lb (65.3 kg) IBW/kg (Calculated) : 45.5  Temp (24hrs), Avg:98.2 F (36.8 C), Min:98.2 F (36.8 C), Max:98.2 F (36.8 C)  Recent Labs  Lab 06/03/18 0405  WBC 13.3*  CREATININE 1.64*    Estimated Creatinine Clearance: 28.1 mL/min (A) (by C-G formula based on SCr of 1.64 mg/dL (H)).    No Known Allergies  Antimicrobials this admission: Vancomycin 10/5  >>    >>   Dose adjustments this admission:   Microbiology results: 10/5 Sputum: pending  10/5 MRSA PCR : pending      10/5 CXR: upper right consolidation Thank you for allowing pharmacy to be a part of this patient's care.  Rekita Miotke S 06/03/2018 6:43 AM

## 2018-06-03 NOTE — ED Notes (Signed)
Call to floor; room change had just occurred, Farris Has, RN to assign floor RN and then call this RN

## 2018-06-03 NOTE — ED Provider Notes (Signed)
[facility] Emergency Department Provider Note  ____________________________________________   First MD Initiated Contact with Patient 06/03/18 0411     (approximate)  I have reviewed the triage vital signs and the nursing notes.   HISTORY  Chief Complaint Altered Mental Status  Level 5 caveat:  history/ROS limited by acute/critical illness  HPI Melissa Knox is a 67 y.o. female with a history of oxygen dependence and other medical issues as listed below who presents unresponsive by private vehicle.  The husband pulled up with the patient in the car and alerted staff that they needed help getting her inside.  She was brought to room 11 and was completely unresponsive and I was called immediately to the room.  The husband provided additional history.  He states that she has been acting a little bit on over the last couple of days and had increased work of breathing and shortness of breath.  She uses an oxygen concentrator at home and only uses 2 L by nasal cannula at baseline but they have been turning it up but she seems to be getting worse gradually over time.  The patient's symptoms became severe tonight and when they checked her oxygen level at home it was 40%.  He placed her on the portable oxygen tank they had available and got in their private vehicle to come to the ED, but unfortunately it seems that the oxygen tank was empty or close to empty, so by the time they arrived to the ED the oxygen was completely empty and the patient was unresponsive.  He gave no history of recent fever/chills, chest pain, nausea, vomiting, nor abdominal pain.  Past Medical History:  Diagnosis Date  . Atherosclerotic heart disease 06/12/2015  . CHF (congestive heart failure) ([place])   . Chronic congestive heart failure with left ventricular diastolic dysfunction ([place]) 04/12/2018  . CKD (chronic kidney disease) stage 2, GFR 60-89 ml/min 06/12/2015  . COPD (chronic obstructive  pulmonary disease) ([place])   . Hypercalcemia 06/12/2015  . Hyperlipidemia 06/12/2015  . Hypertension 06/12/2015  . Hypothyroidism 06/12/2015  . Insomnia 06/12/2015  . Interstitial pneumonia ([place]) 04/12/2018  . 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 Shortness of breath 06/12/2015  . Solitary pulmonary nodule 06/12/2015    Patient Active Problem List   Diagnosis Date Noted  . HCAP (healthcare-associated pneumonia) 06/03/2018  . Chronic congestive heart failure with left ventricular diastolic dysfunction ([place]) 04/12/2018  . Obstructive chronic bronchitis without exacerbation ([place]) 02/17/2018  . Major depression, chronic 02/17/2018  . On supplemental oxygen therapy 01/21/2018  . Pollen allergies 01/16/2018  . CHF (congestive heart failure) ([place]) 05/28/2017  . Syncope 02/06/2017  . Pancreatic mass 02/06/2017  . Iron deficiency anemia 05/01/2016  . Anemia in chronic renal disease 04/26/2016  . Insomnia 06/12/2015  . CKD (chronic kidney disease) stage 2, GFR 60-89 ml/min 06/12/2015  . Hypercalcemia 06/12/2015  . Hypothyroidism 06/12/2015  . 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 Hyperlipidemia 06/12/2015  . Hypertension 06/12/2015  . Atherosclerotic heart disease 06/12/2015  . Solitary pulmonary nodule 06/12/2015    Past Surgical History:  Procedure Laterality Date  . GALLBLADDER SURGERY  2012    Prior to Admission medications   Medication Sig Start Date End Date Taking? Authorizing Provider  albuterol (PROVENTIL) (2.5 MG/3ML) 0.083% nebulizer solution Take 2.5 mg by nebulization every 6 (six) hours as needed for wheezing or shortness of breath.   Yes [provider]  amLODipine (NORVASC) 10 MG tablet Take 1 tablet (10  mg total) by mouth daily. 04/18/18  Yes Lavera Guise, MD  aspirin EC 81 MG tablet Take 81 mg by mouth daily.  01/22/18 01/22/19 Yes [provider]  atorvastatin (LIPITOR) 10 MG tablet Take 1 tablet (10 mg total) by mouth daily at 6 PM.  01/12/18  Yes Boscia, Heather E, NP  budesonide-formoterol (SYMBICORT) 160-4.5 MCG/ACT inhaler Inhale 2 puffs into the lungs 2 (two) times daily.   Yes [provider]  carvedilol (COREG) 6.25 MG tablet Take 6.25 mg by mouth 2 (two) times daily. 10/03/17  Yes [provider]  docusate sodium (COLACE) 100 MG capsule Take 1 capsule (100 mg total) by mouth 2 (two) times daily as needed for mild constipation. 04/14/18  Yes Gouru, Illene Silver, MD  furosemide (LASIX) 20 MG tablet Take 1 tablet (20 mg total) by mouth daily. 03/22/18  Yes Boscia, Greer Ee, NP  hydrALAZINE (APRESOLINE) 50 MG tablet Take 50 mg by mouth 3 (three) times daily.  04/10/16  Yes [provider]  levothyroxine (SYNTHROID, LEVOTHROID) 50 MCG tablet Take 1 tablet (50 mcg total) by mouth daily before breakfast. 02/20/18  Yes Boscia, Heather E, NP  loratadine (CLARITIN) 10 MG tablet Take 1 tablet (10 mg total) by mouth daily. Patient taking differently: Take 10 mg by mouth daily as needed.  02/06/18  Yes Allyne Gee, MD  losartan (COZAAR) 100 MG tablet Take 100 mg by mouth daily.   Yes [provider]  meclizine (ANTIVERT) 12.5 MG tablet Take 12.5 mg by mouth 3 (three) times daily as needed for dizziness.  06/02/16  Yes [provider]  mirtazapine (REMERON) 15 MG tablet Take 0.5 tablets (7.5 mg total) by mouth at bedtime. 02/17/18  Yes Boscia, Heather E, NP  pantoprazole (PROTONIX) 40 MG tablet Take 1 tablet (40 mg total) by mouth 2 (two) times daily. 05/22/18  Yes Boscia, Heather E, NP  sucralfate (CARAFATE) 1 g tablet Take 1 tablet (1 g total) by mouth 4 (four) times daily -  with meals and at bedtime. 01/27/18  Yes Ronnell Freshwater, NP  OXYGEN Inhale into the lungs. 2 litre    [provider]    Allergies Patient has no known allergies.  Family History  Problem Relation Age of Onset  . Cancer Mother   . Hypertension Father   . Diabetes Father   . Cancer Father     Social  History Social History   Tobacco Use  . Smoking status: Former Smoker    Last attempt to quit: 2013    Years since quitting: 6.7  . Smokeless tobacco: Never Used  Substance Use Topics  . Alcohol use: No    Alcohol/week: 0.0 standard drinks  . Drug use: No    Review of Systems Level 5 caveat:  history/ROS limited by acute/critical illness ____________________________________________   PHYSICAL EXAM:  VITAL SIGNS: ED Triage Vitals  Enc Vitals Group     BP 06/03/18 0406 (!) 160/43     Pulse Rate 06/03/18 0406 (!) 118     Resp 06/03/18 0406 (!) 28     Temp 06/03/18 0406 98.2 F (36.8 C)     Temp Source 06/03/18 0406 Oral     SpO2 06/03/18 0406 95 %     Weight 06/03/18 0410 65.3 kg (144 lb)     Height 06/03/18 0410 1.524 m (5')     Head Circumference --      Peak Flow --      Pain Score 06/03/18  0409 0     Pain Loc --      Pain Edu? --      Excl. in [place]? --     Constitutional: Initially unresponsive, came back to full alertness and awareness after going on nonrebreather at 100% Eyes: Conjunctivae are normal.  Head: Atraumatic. Nose: No congestion/rhinnorhea. Mouth/Throat: Mucous membranes are moist. Neck: No stridor.  No meningeal signs.   Cardiovascular: Tachycardia, regular rhythm. Good peripheral circulation. Grossly normal heart sounds. Respiratory: Lungs are clear to auscultation bilaterally with no wheezing. Gastrointestinal: Soft and nontender. No distention.  Musculoskeletal: No lower extremity tenderness nor edema. No gross deformities of extremities. Neurologic:  Normal speech and language. No gross focal neurologic deficits are appreciated.  Skin:  Skin is warm, dry and intact. No rash noted.  Initially the patient had perioral cyanosis but that quickly improved after going on the nonrebreather.   ____________________________________________   LABS (all labs ordered are listed, but only abnormal results are displayed)  Labs Reviewed  COMPREHENSIVE  METABOLIC PANEL - Abnormal; Notable for the following components:      Result Value   CO2 21 (*)    Glucose, Bld 178 (*)    BUN 25 (*)    Creatinine, Ser 1.64 (*)    Calcium 8.7 (*)    Albumin 3.1 (*)    GFR calc non Af Amer 31 (*)    GFR calc Af Amer 36 (*)    All other components within normal limits  BRAIN NATRIURETIC PEPTIDE - Abnormal; Notable for the following components:   B Natriuretic Peptide 369.0 (*)    All other components within normal limits  TROPONIN I - Abnormal; Notable for the following components:   Troponin I 0.06 (*)    All other components within normal limits  CBC WITH DIFFERENTIAL/PLATELET - Abnormal; Notable for the following components:   WBC 13.3 (*)    RBC 2.66 (*)    Hemoglobin 6.9 (*)    HCT 23.3 (*)    MCH 25.9 (*)    MCHC 29.6 (*)    RDW 19.2 (*)    Platelets 710 (*)    Neutro Abs 9.1 (*)    All other components within normal limits  BLOOD GAS, VENOUS - Abnormal; Notable for the following components:   pCO2, Ven 41 (*)    Acid-base deficit 2.1 (*)    All other components within normal limits  MRSA PCR SCREENING  GRAM STAIN  EXPECTORATED SPUTUM ASSESSMENT W REFEX TO RESP CULTURE  LIPASE, BLOOD  URINALYSIS, ROUTINE W REFLEX MICROSCOPIC  TSH  HEMOGLOBIN A1C  TROPONIN I  TROPONIN I  TROPONIN I  HIV ANTIBODY (ROUTINE TESTING W REFLEX)  STREP PNEUMONIAE URINARY ANTIGEN  PROCALCITONIN   ____________________________________________  EKG  ED ECG REPORT I, Hinda Kehr, the attending physician, personally viewed and interpreted this ECG.  Date: 06/03/2018 EKG Time: 4:06 AM Rate: 117 Rhythm: Sinus tachycardia QRS Axis: normal Intervals: normal ST/T Wave abnormalities: Non-specific ST segment / T-wave changes, but no evidence of acute ischemia. Narrative Interpretation: no definitive evidence of acute ischemia   ____________________________________________  RADIOLOGY   ED MD interpretation:  Right -sided pleural effusion  increased from prior  Official radiology report(s): Dg Chest Port 1 View  Result Date: 06/03/2018 CLINICAL DATA:  67 y/o  F; shortness of breath and hypoxemia. EXAM: PORTABLE CHEST 1 VIEW COMPARISON:  05/08/2018 CT chest. 04/11/2018 chest radiograph. FINDINGS: Stable cardiac silhouette given projection and technique. Calcific aortic atherosclerosis. Stable medial aspect of  right upper lobe consolidation. Stable findings of emphysema and coarse reticulation with basilar predominance compatible with fibrosis. Interval increase in right-sided pleural effusion. Right upper quadrant cholecystectomy clips. No acute osseous abnormality identified. IMPRESSION: Interval increase in right-sided pleural effusion. Stable right upper lobe consolidation. Stable findings of emphysema, fibrosis, aortic atherosclerosis. Electronically Signed   By: Kristine Garbe M.D.   On: 06/03/2018 06:10    ____________________________________________   PROCEDURES  Critical Care performed: Yes, see critical care procedure note(s)   Procedure(s) performed:   .Critical Care Performed by: Hinda Kehr, MD Authorized by: Hinda Kehr, MD   Critical care provider statement:    Critical care time (minutes):  30   Critical care time was exclusive of:  Separately billable procedures and treating other patients   Critical care was necessary to treat or prevent imminent or life-threatening deterioration of the following conditions:  Respiratory failure   Critical care was time spent personally by me on the following activities:  Development of treatment plan with patient or surrogate, discussions with consultants, evaluation of patient's response to treatment, examination of patient, obtaining history from patient or surrogate, ordering and performing treatments and interventions, ordering and review of laboratory studies, ordering and review of radiographic studies, pulse oximetry, re-evaluation of patient's condition  and review of old charts     ____________________________________________   INITIAL IMPRESSION / ASSESSMENT AND PLAN / ED COURSE  As part of my medical decision making, I reviewed the following data within the electronic MEDICAL RECORD NUMBER History obtained from family, Nursing notes reviewed and incorporated, Labs reviewed , EKG interpreted , Old chart reviewed, Radiograph reviewed , Discussed with admitting physician  and Notes from prior ED visits    Differential diagnosis includes, but is not limited to, acute respiratory failure secondary to oxygen equipment malfunction, pneumonia, pulmonary embolism,  Pulmonary fibrosis.  When the patient first came to the emergency room she was unresponsive and had a legitimate pulse oximeter reading of 5% on room air with a good waveform.  We placed her immediately on a nonrebreather and her oxygen saturation came up to the upper 90s almost immediately.  Her color improved and she became awake and alert and responsive.  She denied any pain and says she felt "pretty good".  After a few minutes debating about whether to put her on BiPAP, I decided not to because her lungs are clear and she is not having any increased work of breathing.  We then tried her back on 2 L of oxygen by nasal cannula and in less than 30 seconds she dropped down to 27% and became less responsive.  On a nonrebreather she came back up to 100%.  The source of her hypoxia is unclear but she has done this in the past.  No clear sign of a new or emergent problem but she has a very high oxygen requirement right now and needs additional monitoring and treatment in the hospital.  She has had no recent infectious signs or symptoms.  She does have a slight leukocytosis of 13.3 and she has a low hemoglobin of 6.9 which is lower than the last value of 8.5; her anemia could be contributing to her hypoxia and her lack of oxygen reserve.  Elevated troponin of 0.06 is due, I suspect, to demand  ischemia in the setting of profound hypoxia.  Creatinine is roughly at baseline.  She is not having any chest pain.    I have discussed the case with hospitalist who will admit.  ____________________________________________  FINAL CLINICAL IMPRESSION(S) / ED DIAGNOSES  Final diagnoses:  Acute respiratory failure with hypoxia (HCC)  Elevated troponin I level  Chronic kidney disease, unspecified CKD stage  Acute on chronic anemia  Pleural effusion on right     MEDICATIONS GIVEN DURING THIS VISIT:  Medications  fluticasone furoate-vilanterol (BREO ELLIPTA) 200-25 MCG/INH 1 puff (has no administration in time range)  albuterol (PROVENTIL) (2.5 MG/3ML) 0.083% nebulizer solution 2.5 mg (has no administration in time range)  hydrALAZINE (APRESOLINE) tablet 50 mg (has no administration in time range)  meclizine (ANTIVERT) tablet 12.5 mg (has no administration in time range)  carvedilol (COREG) tablet 6.25 mg (has no administration in time range)  atorvastatin (LIPITOR) tablet 10 mg (has no administration in time range)  sucralfate (CARAFATE) tablet 1 g (has no administration in time range)  loratadine (CLARITIN) tablet 10 mg (has no administration in time range)  aspirin EC tablet 81 mg (has no administration in time range)  mirtazapine (REMERON) tablet 7.5 mg (has no administration in time range)  levothyroxine (SYNTHROID, LEVOTHROID) tablet 50 mcg (has no administration in time range)  furosemide (LASIX) tablet 20 mg (has no administration in time range)  docusate sodium (COLACE) capsule 100 mg (has no administration in time range)  amLODipine (NORVASC) tablet 10 mg (has no administration in time range)  losartan (COZAAR) tablet 100 mg (has no administration in time range)  pantoprazole (PROTONIX) EC tablet 40 mg (has no administration in time range)  heparin injection 5,000 Units (has no administration in time range)  acetaminophen (TYLENOL) tablet 650 mg (has no administration in  time range)    Or  acetaminophen (TYLENOL) suppository 650 mg (has no administration in time range)  ondansetron (ZOFRAN) tablet 4 mg (has no administration in time range)    Or  ondansetron (ZOFRAN) injection 4 mg (has no administration in time range)  ceFEPIme (MAXIPIME) 1 g in sodium chloride 0.9 % 100 mL IVPB (has no administration in time range)  vancomycin (VANCOCIN) IVPB 1000 mg/200 mL premix (has no administration in time range)  vancomycin (VANCOCIN) IVPB 1000 mg/200 mL premix (has no administration in time range)  acetaminophen (TYLENOL) tablet 1,000 mg (1,000 mg Oral Given 06/03/18 0547)     ED Discharge Orders    None       Note:  This document was prepared using Dragon voice recognition software and may include unintentional dictation errors.    Hinda Kehr, MD 06/03/18 [phone]

## 2018-06-03 NOTE — Progress Notes (Signed)
Pt has remained alert and oriented with no c/o pain. Pt has transitioned from bipap to HFNC-currently 60%. Pt does experience desaturation with self repositioning in the bed; however, seems completely asymptomatic. IV lasix 20mg  given x1. Lung sounds with fine crackles to RUL, diminished in lower lobes.  Pt is currently dangling on side of bed for dinner-NDN. Pt has remained in NSR on cardiac monitor. BP labile - ALL HTN meds have been HELD today. HR WNL. Pt has received 1UPRVC for a hgb of 6.9->1 hr post recheck hgb 7.9. Pt is cont to bedpan at this time for concern of resp distress with ambulation. Husband has been updated at bedside with Dr Jefferson Fuel this afternoon.

## 2018-06-03 NOTE — ED Triage Notes (Signed)
Pt removed from car in front of ED entrance.  Pt wheeled to room 11.   HX of COPD, CHF, HTN, and thyroid problems.  Husband in room talking to Dr Karma Greaser.  Pt c/o being cold att.  O2 Sats were 5% when coming into room, pt on portable oxygen generator

## 2018-06-03 NOTE — ED Notes (Signed)
Family at bedside.  Fluids given

## 2018-06-04 LAB — TYPE AND SCREEN
ABO/RH(D): A POS
ANTIBODY SCREEN: NEGATIVE
UNIT DIVISION: 0

## 2018-06-04 LAB — CBC
HEMATOCRIT: 23.6 % — AB (ref 35.0–47.0)
Hemoglobin: 7.7 g/dL — ABNORMAL LOW (ref 12.0–16.0)
MCH: 27.4 pg (ref 26.0–34.0)
MCHC: 32.5 g/dL (ref 32.0–36.0)
MCV: 84.2 fL (ref 80.0–100.0)
PLATELETS: 558 10*3/uL — AB (ref 150–440)
RBC: 2.81 MIL/uL — ABNORMAL LOW (ref 3.80–5.20)
RDW: 18.2 % — AB (ref 11.5–14.5)
WBC: 6 10*3/uL (ref 3.6–11.0)

## 2018-06-04 LAB — BPAM RBC
Blood Product Expiration Date: 201910242359
ISSUE DATE / TIME: 201910051309
UNIT TYPE AND RH: 6200

## 2018-06-04 LAB — BASIC METABOLIC PANEL
ANION GAP: 9 (ref 5–15)
BUN: 23 mg/dL (ref 8–23)
CALCIUM: 8.5 mg/dL — AB (ref 8.9–10.3)
CO2: 25 mmol/L (ref 22–32)
Chloride: 105 mmol/L (ref 98–111)
Creatinine, Ser: 1.26 mg/dL — ABNORMAL HIGH (ref 0.44–1.00)
GFR calc Af Amer: 50 mL/min — ABNORMAL LOW (ref >60)
GFR, EST NON AFRICAN AMERICAN: 43 mL/min — AB (ref >60)
Glucose, Bld: 172 mg/dL — ABNORMAL HIGH (ref 70–99)
POTASSIUM: 3.3 mmol/L — AB (ref 3.5–5.1)
Sodium: 139 mmol/L (ref 135–145)

## 2018-06-04 LAB — PROCALCITONIN: Procalcitonin: 0.18 ng/mL

## 2018-06-04 MED ORDER — POTASSIUM CHLORIDE 20 MEQ PO PACK
40.0000 meq | PACK | Freq: Once | ORAL | Status: AC
  Administered 2018-06-04: 40 meq via ORAL
  Filled 2018-06-04: qty 2

## 2018-06-04 MED ORDER — ORAL CARE MOUTH RINSE
15.0000 mL | Freq: Two times a day (BID) | OROMUCOSAL | Status: DC
  Administered 2018-06-04 – 2018-06-07 (×6): 15 mL via OROMUCOSAL

## 2018-06-04 NOTE — Progress Notes (Signed)
Melissa Knox at [place] NAME: Melissa Knox    MR#:  [id number]  DATE OF BIRTH:  Oct 28, 1950  SUBJECTIVE:  CHIEF COMPLAINT:   Chief Complaint  Patient presents with  . Altered Mental Status   Pt has better shortness of breath, on HF O2. REVIEW OF SYSTEMS:  Review of Systems  Constitutional: Negative for chills, fever and malaise/fatigue.  HENT: Negative for sore throat.   Eyes: Negative for blurred vision and double vision.  Respiratory: Positive for cough and shortness of breath. Negative for hemoptysis, sputum production, wheezing and stridor.   Cardiovascular: Negative for chest pain, palpitations, orthopnea and leg swelling.  Gastrointestinal: Negative for abdominal pain, blood in stool, diarrhea, melena, nausea and vomiting.  Genitourinary: Negative for dysuria, flank pain and hematuria.  Musculoskeletal: Negative for back pain and joint pain.  Skin: Negative for rash.  Neurological: Negative for dizziness, sensory change, focal weakness, seizures, loss of consciousness, weakness and headaches.  Endo/Heme/Allergies: Negative for polydipsia.  Psychiatric/Behavioral: Negative for depression. The patient is not nervous/anxious.     DRUG ALLERGIES:  No Known Allergies VITALS:  Blood pressure (!) 121/46, pulse 78, temperature 97.8 F (36.6 C), temperature source Oral, resp. rate (!) 23, height 5' (1.524 m), weight 63.8 kg, SpO2 100 %. PHYSICAL EXAMINATION:  Physical Exam  Constitutional: She is oriented to person, place, and time. No distress.  HENT:  Head: Normocephalic.  Mouth/Throat: Oropharynx is clear and moist.  Eyes: Pupils are equal, round, and reactive to light. Conjunctivae and EOM are normal. No scleral icterus.  Neck: Normal range of motion. Neck supple. No JVD present. No tracheal deviation present.  Cardiovascular: Normal rate, regular rhythm and normal heart sounds. Exam reveals no gallop.  No murmur  heard. Pulmonary/Chest: Effort normal. No stridor. No respiratory distress. She has no wheezes. She has no rales.  Diminished lung sounds, crackles on the left side.  Abdominal: Soft. Bowel sounds are normal. She exhibits no distension. There is no tenderness. There is no rebound.  Musculoskeletal: Normal range of motion. She exhibits no edema or tenderness.  Neurological: She is alert and oriented to person, place, and time. No cranial nerve deficit.  Skin: No rash noted. She is not diaphoretic. No erythema.  Psychiatric: She has a normal mood and affect.   LABORATORY PANEL:  Female CBC Recent Labs  Lab 06/04/18 0611  WBC 6.0  HGB 7.7*  HCT 23.6*  PLT 558*   ------------------------------------------------------------------------------------------------------------------ Chemistries  Recent Labs  Lab 06/03/18 0405 06/03/18 1033 06/04/18 0611  NA 140  --  139  K 3.7  --  3.3*  CL 106  --  105  CO2 21*  --  25  GLUCOSE 178*  --  172*  BUN 25*  --  23  CREATININE 1.64*  --  1.26*  CALCIUM 8.7*  --  8.5*  MG  --  1.7  --   AST 18  --   --   ALT 9  --   --   ALKPHOS 64  --   --   BILITOT 0.4  --   --    RADIOLOGY:  No results found. ASSESSMENT AND PLAN:   This is a 67 year old female admitted for pneumonia. 1.    Acute respiratory failure with hypoxia due to pneumonia: Healthcare associated; Continue HF O2, continue cefepime and vancomycin is discontinued since MRSA is negative.  DuoNeb every 6 hours.  Follow-up culture.  2.  Interstitial lung disease: The  patient has emphysematous changes as well as fibrosis and groundglass opacities.  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  Follow-up Dr. Humphrey Rolls as outpatient.  3.  Hypertension:controlled; continue hydralazine, [place], amlodipine and carvedilol.  4.  Hypothyroidism: Check TSH; continue Synthroid  5.  CHF: Secondary to venous hypertension; continue Lasix per home regimen  6.  Hyperlipidemia: Continue  statin therapy  ARF on CKD stage III.  Improved. Follow-up BMP while on Lasix.  Anemia of chronic disease.  Hemoglobin decreased to 6.9.  S/P PRBC transfusion, hemoglobin 7.7.  Follow-up stool occult.  The patient is in critical condition, I discussed with patient about the patient's current condition and prognosis.  The patient does not want intubation but want any other treatment including CPR. All the records are reviewed and case discussed with Care Management/Social Worker. Management plans discussed with the patient, family and they are in agreement.  CODE STATUS: Partial Code  TOTAL TIME TAKING CARE OF THIS PATIENT: 35 minutes.   More than 50% of the time was spent in counseling/coordination of care: YES  POSSIBLE D/C IN 3 DAYS, DEPENDING ON CLINICAL CONDITION.   Demetrios Loll M.D on 06/04/2018 at 3:01 PM  Between 7am to 6pm - Pager - [phone]  After 6pm go to [email or web address] - [facility]

## 2018-06-04 NOTE — Plan of Care (Signed)
Patient rested this shift. Remains on HFNC @50 %.  Becomes dyspneic on exertion.  Uses bedpan and desats.  Able to make needs known.  No acute distress noted.  Declined BiPap at bedtime.  No c/o of discomfort.  Will continue to monitor.

## 2018-06-04 NOTE — Progress Notes (Signed)
Patient on Heated High Flow Nasal Cannula 40% 45L per min. SAT at 98%. Bipap on standby in room.

## 2018-06-04 NOTE — Progress Notes (Signed)
Follow up - Critical Care Medicine Note  Patient Details:    Melissa Knox is an 67 y.o. female.with a past medical history remarkable for hypertension, hyperlipidemia, hypothyroidism, chronic renal insufficiency, congestive heart failure, coronary artery disease, combination bullous lung disease with fibrosis presents with increasing shortness of breath and desaturation.  Patient usually uses 2 L of oxygen at home.  Upon arrival to the emergency department she was found to be somnolent and hypoxemic.  She was placed initially on nonrebreather and subsequently admitted to the floor.  Unfortunately she developed worsening shortness of breath and was transferred from floor to the intensive care unit on noninvasive ventilation.  Presently she is resting comfortably, awake, alert, communicating states that she feels much better.  Denies any recent fever, chills, night sweats or sputum production  Lines, Airways, Drains:    Anti-infectives:  Anti-infectives (From admission, onward)   Start     Dose/Rate Route Frequency Ordered Stop   06/04/18 0800  vancomycin (VANCOCIN) IVPB 1000 mg/200 mL premix     1,000 mg 200 mL/hr over 60 Minutes Intravenous Every 36 hours 06/03/18 0642     06/03/18 1000  ceFEPIme (MAXIPIME) 1 g in sodium chloride 0.9 % 100 mL IVPB     1 g 200 mL/hr over 30 Minutes Intravenous Every 12 hours 06/03/18 0757 06/11/18 0959   06/03/18 0715  vancomycin (VANCOCIN) IVPB 1000 mg/200 mL premix     1,000 mg 200 mL/hr over 60 Minutes Intravenous  Once 06/03/18 9326 06/03/18 1328      Microbiology: Results for orders placed or performed during the hospital encounter of 06/03/18  MRSA PCR Screening     Status: None   Collection Time: 06/03/18 10:07 AM  Result Value Ref Range Status   MRSA by PCR NEGATIVE NEGATIVE Final    Comment:        The GeneXpert MRSA Assay (FDA approved for NASAL specimens only), is one component of a comprehensive MRSA colonization surveillance program.  It is not intended to diagnose MRSA infection nor to guide or monitor treatment for MRSA infections. Performed at [facility], [address] [place], [place] [address]    Studies: Ct Chest High Resolution  Result Date: 05/08/2018 CLINICAL DATA:  Worsening shortness of breath on exertion for 1 year. EXAM: CT CHEST WITHOUT CONTRAST TECHNIQUE: Multidetector CT imaging of the chest was performed following the standard protocol without intravenous contrast. High resolution imaging of the lungs, as well as inspiratory and expiratory imaging, was performed. COMPARISON:  04/11/2018 and additional CT chest examinations dating back to 08/24/2012. FINDINGS: Cardiovascular: Atherosclerotic calcification of the arterial vasculature, including coronary arteries and aortic valve. Heart is enlarged. No pericardial effusion. Mediastinum/Nodes: Mediastinal lymph nodes measure up to 1.5 cm in the low right paratracheal station, similar. Hilar regions are difficult to evaluate without IV contrast. No axillary adenopathy. Fluid is seen in the esophagus. Lungs/Pleura: Bullous centrilobular and paraseptal emphysema. Consolidation and ground-glass in the posterior segment right upper lobe and superior segment right lower lobe are similar to 04/11/2018 but appear rather progressive from 05/28/2017. Basilar predominant traction bronchiectasis/bronchiolectasis. Small right pleural effusion, minimally increased from 04/11/2018. Airway is otherwise unremarkable. Upper Abdomen: Visualized portions of the liver and adrenal glands are unremarkable. Punctate stone in the right kidney. Scarring in the upper pole right kidney. Visualized portions of the kidneys, spleen, pancreas, stomach and bowel are grossly unremarkable. Cholecystectomy. Calcified lymph nodes in the periportal region. Musculoskeletal: No worrisome lytic or sclerotic lesions. IMPRESSION: 1. Patchy area  of consolidation and ground-glass in the posterior segment  right upper lobe and superior segment right lower lobe, grossly stable from 04/11/2018 but progressive from 05/28/2017. Findings are worrisome for primary bronchogenic carcinoma. 2. Small right pleural effusion, minimally increased from 04/11/2018. 3. 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 4. Aortic atherosclerosis (ICD10-170.0). Coronary artery calcification. 5. Punctate right renal stone. Electronically Signed   By: Lorin Picket M.D.   On: 05/08/2018 09:23   Dg Chest Port 1 View  Result Date: 06/03/2018 CLINICAL DATA:  67 y/o  F; shortness of breath and hypoxemia. EXAM: PORTABLE CHEST 1 VIEW COMPARISON:  05/08/2018 CT chest. 04/11/2018 chest radiograph. FINDINGS: Stable cardiac silhouette given projection and technique. Calcific aortic atherosclerosis. Stable medial aspect of right upper lobe consolidation. Stable findings of emphysema and coarse reticulation with basilar predominance compatible with fibrosis. Interval increase in right-sided pleural effusion. Right upper quadrant cholecystectomy clips. No acute osseous abnormality identified. IMPRESSION: Interval increase in right-sided pleural effusion. Stable right upper lobe consolidation. Stable findings of emphysema, fibrosis, aortic atherosclerosis. Electronically Signed   By: Kristine Garbe M.D.   On: 06/03/2018 06:10    Consults:    Subjective:    Overnight Issues: Patient was successfully weaned off of BiPAP, presently on heated high flow at 50%, states she is feeling much better  Objective:  Vital signs for last 24 hours: Temp:  [97.6 F (36.4 C)-98.8 F (37.1 C)] 98.8 F (37.1 C) (10/06 0200) Pulse Rate:  [72-101] 90 (10/06 0700) Resp:  [17-25] 25 (10/06 0700) BP: (109-178)/(34-62) 136/57 (10/06 0700) SpO2:  [52 %-100 %] 98 % (10/06 0738) FiO2 (%):  [50 %-100 %] 50 % (10/06 0738) Weight:  [63.8 kg-66.2 kg] 63.8 kg (10/06 0500)  Hemodynamic  parameters for last 24 hours:    Intake/Output from previous day: 10/05 0701 - 10/06 0700 In: 1110 [P.O.:480; Blood:330; IV Piggyback:300] Out: 750 [Urine:750]  Intake/Output this shift: No intake/output data recorded.  Vent settings for last 24 hours: FiO2 (%):  [50 %-100 %] 50 %  Physical Exam:   Vital signs:       Please see the above listed vital signs HEENT:           Trachea is midline, no accessory muscle utilization presently using heated high flow  Cardiovascular:           Regular rate and rhythm Pulmonary:       Markedly improved wheezing and crackles Abdominal:      Positive bowel sounds, soft exam Extremities:     No clubbing, cyanosis or edema noted Neurologic:      No focal deficits noted Assessment/Plan:   Respiratory failure.  Patient on vancomycin, cefepime, Solu-Medrol, albuterol, Atrovent, budesonide, wean as tolerated heated high flow.   She has had a history and is followed by Dr. Humphrey Rolls in the outpatient setting as her Primary Pulmonologist for interstitial pneumonia, groundglass, fibrosis, cystic/bullous changes.  She has had ANCA positivity and has seen rheumatology in the past she also has a lower lobe nodule that she is being followed in the outpatient setting.  although work-up would be difficult and therapeutic secondary to her severe respiratory limitation.  She denies any hemoptysis  Renal insufficiency.  BUN is 23/creatinine 1.26  Anemia.  Status post 1 unit of packed red blood cell with follow-up hemoglobin of 7.7.  No evidence of active bleeding  Leukocytosis resolved, on cefepime and vancomycin, white count this morning is 6.   Chantille Navarrete 06/04/2018  *Care during  the described time interval was provided by me and/or other providers on the critical care team.  I have reviewed this patient's available data, including medical history, events of note, physical examination and test results as part of my evaluation.

## 2018-06-04 NOTE — Progress Notes (Signed)
Decreased fio2 to 40% and flow to 40l

## 2018-06-05 LAB — CBC WITH DIFFERENTIAL/PLATELET
BASOS ABS: 0 10*3/uL (ref 0–0.1)
BASOS PCT: 0 %
EOS ABS: 0 10*3/uL (ref 0–0.7)
Eosinophils Relative: 0 %
HCT: 25.4 % — ABNORMAL LOW (ref 35.0–47.0)
Hemoglobin: 7.9 g/dL — ABNORMAL LOW (ref 12.0–16.0)
Lymphocytes Relative: 5 %
Lymphs Abs: 0.7 10*3/uL — ABNORMAL LOW (ref 1.0–3.6)
MCH: 26.8 pg (ref 26.0–34.0)
MCHC: 31.2 g/dL — AB (ref 32.0–36.0)
MCV: 85.8 fL (ref 80.0–100.0)
MONO ABS: 0.3 10*3/uL (ref 0.2–0.9)
Monocytes Relative: 2 %
NEUTROS ABS: 12.3 10*3/uL — AB (ref 1.4–6.5)
Neutrophils Relative %: 93 %
PLATELETS: 625 10*3/uL — AB (ref 150–440)
RBC: 2.96 MIL/uL — ABNORMAL LOW (ref 3.80–5.20)
RDW: 19.2 % — AB (ref 11.5–14.5)
WBC: 13.3 10*3/uL — ABNORMAL HIGH (ref 3.6–11.0)

## 2018-06-05 LAB — COMPREHENSIVE METABOLIC PANEL
ALT: 14 U/L (ref 0–44)
AST: 19 U/L (ref 15–41)
Albumin: 2.7 g/dL — ABNORMAL LOW (ref 3.5–5.0)
Alkaline Phosphatase: 50 U/L (ref 38–126)
Anion gap: 8 (ref 5–15)
BUN: 35 mg/dL — ABNORMAL HIGH (ref 8–23)
CALCIUM: 8.9 mg/dL (ref 8.9–10.3)
CHLORIDE: 112 mmol/L — AB (ref 98–111)
CO2: 23 mmol/L (ref 22–32)
CREATININE: 1.45 mg/dL — AB (ref 0.44–1.00)
GFR, EST AFRICAN AMERICAN: 42 mL/min — AB (ref >60)
GFR, EST NON AFRICAN AMERICAN: 36 mL/min — AB (ref >60)
GLUCOSE: 201 mg/dL — AB (ref 70–99)
Potassium: 3.6 mmol/L (ref 3.5–5.1)
Sodium: 143 mmol/L (ref 135–145)
TOTAL PROTEIN: 7.3 g/dL (ref 6.5–8.1)
Total Bilirubin: 0.5 mg/dL (ref 0.3–1.2)

## 2018-06-05 LAB — CREATININE, SERUM
Creatinine, Ser: 1.48 mg/dL — ABNORMAL HIGH (ref 0.44–1.00)
GFR calc Af Amer: 41 mL/min — ABNORMAL LOW (ref >60)
GFR calc non Af Amer: 35 mL/min — ABNORMAL LOW (ref >60)

## 2018-06-05 LAB — MAGNESIUM: MAGNESIUM: 1.7 mg/dL (ref 1.7–2.4)

## 2018-06-05 LAB — HIV ANTIBODY (ROUTINE TESTING W REFLEX): HIV Screen 4th Generation wRfx: NONREACTIVE

## 2018-06-05 LAB — PROCALCITONIN: Procalcitonin: 0.13 ng/mL

## 2018-06-05 MED ORDER — SODIUM CHLORIDE 0.9 % IV SOLN
2.0000 g | Freq: Two times a day (BID) | INTRAVENOUS | Status: DC
  Administered 2018-06-05 – 2018-06-08 (×6): 2 g via INTRAVENOUS
  Filled 2018-06-05 (×9): qty 2

## 2018-06-05 MED ORDER — METHYLPREDNISOLONE SODIUM SUCC 125 MG IJ SOLR
60.0000 mg | Freq: Two times a day (BID) | INTRAMUSCULAR | Status: DC
  Administered 2018-06-05 – 2018-06-06 (×2): 60 mg via INTRAVENOUS
  Filled 2018-06-05 (×2): qty 2

## 2018-06-05 NOTE — Consult Note (Signed)
Pharmacy Antibiotic Note  Melissa Knox is a 67 y.o. female admitted on 06/03/2018 with pneumonia.  Pharmacy has been consulted for Cefepime dosing. Patient was found to be somnolent and hypoxemic on arrival and was admitted on 10/5. She was placed initially on nonrebreather and subsequently was admitted to the floor. She developed worsening SOB and was transferred to the ICU on noninvasive ventilation.  Plan: Increase dose of Cefepime from 1 g Q12H to 2 g Q12H. Vancomycin was discontinued as MRSA PCR was (-).  Height: 5' (152.4 cm) Weight: 141 lb 8.6 oz (64.2 kg) IBW/kg (Calculated) : 45.5  Temp (24hrs), Avg:97.9 F (36.6 C), Min:97.5 F (36.4 C), Max:98.6 F (37 C)  Recent Labs  Lab 06/03/18 0405 06/04/18 0611 06/05/18 0355 06/05/18 0919  WBC 13.3* 6.0  --  13.3*  CREATININE 1.64* 1.26* 1.48* 1.45*    Estimated Creatinine Clearance: 31.5 mL/min (A) (by C-G formula based on SCr of 1.45 mg/dL (H)).    No Known Allergies  Antimicrobials this admission: 10/5 Cefepime >>  10/5 Vancomycin >> 10/6   Microbiology results: 10/5 MRSA PCR: (-)  Thank you for allowing pharmacy to be a part of this patient's care.  Paticia Stack, PharmD Pharmacy Resident  06/05/2018 5:03 PM

## 2018-06-05 NOTE — Progress Notes (Signed)
During rounds Dr. Jefferson Fuel gave order to give norvasc as scheduled and to hold all other antihypertensives for now.

## 2018-06-05 NOTE — Progress Notes (Addendum)
[place] [facility] [place] NAME: Melissa Knox    MR#:  [id number]  DATE OF BIRTH:  [birth year]  SUBJECTIVE:  CHIEF COMPLAINT:   Chief Complaint  Patient presents with  . Altered Mental Status   Pt has better shortness of breath, on O2 Melissa Knox 4 L. Off HF O2 this am. REVIEW OF SYSTEMS:  Review of Systems  Constitutional: Negative for chills, fever and malaise/fatigue.  HENT: Negative for sore throat.   Eyes: Negative for blurred vision and double vision.  Respiratory: Positive for shortness of breath. Negative for cough, hemoptysis, sputum production, wheezing and stridor.   Cardiovascular: Negative for chest pain, palpitations, orthopnea and leg swelling.  Gastrointestinal: Negative for abdominal pain, blood in stool, diarrhea, melena, nausea and vomiting.  Genitourinary: Negative for dysuria, flank pain and hematuria.  Musculoskeletal: Negative for back pain and joint pain.  Skin: Negative for rash.  Neurological: Negative for dizziness, sensory change, focal weakness, seizures, loss of consciousness, weakness and headaches.  Endo/Heme/Allergies: Negative for polydipsia.  Psychiatric/Behavioral: Negative for depression. The patient is not nervous/anxious.     DRUG ALLERGIES:  No Known Allergies VITALS:  Blood pressure (!) 144/58, pulse 78, temperature 97.7 F (36.5 C), temperature source Oral, resp. rate 17, height 5' (1.524 m), weight 64.2 kg, SpO2 91 %. PHYSICAL EXAMINATION:  Physical Exam  Constitutional: She is oriented to person, place, and time. No distress.  HENT:  Head: Normocephalic.  Mouth/Throat: Oropharynx is clear and moist.  Eyes: Pupils are equal, round, and reactive to light. Conjunctivae and EOM are normal. No scleral icterus.  Neck: Normal range of motion. Neck supple. No JVD present. No tracheal deviation present.  Cardiovascular: Normal rate, regular rhythm and normal heart sounds. Exam reveals no gallop.  No murmur  heard. Pulmonary/Chest: Effort normal. No stridor. No respiratory distress. She has no wheezes. She has no rales.  Diminished lung sounds, crackles on the left side.  Abdominal: Soft. Bowel sounds are normal. She exhibits no distension. There is no tenderness. There is no rebound.  Musculoskeletal: Normal range of motion. She exhibits no edema or tenderness.  Neurological: She is alert and oriented to person, place, and time. No cranial nerve deficit.  Skin: No rash noted. She is not diaphoretic. No erythema.  Psychiatric: She has a normal mood and affect.   LABORATORY PANEL:  Female CBC Recent Labs  Lab 06/05/18 0919  WBC 13.3*  HGB 7.9*  HCT 25.4*  PLT 625*   ------------------------------------------------------------------------------------------------------------------ Chemistries  Recent Labs  Lab 06/05/18 0919  NA 143  K 3.6  CL 112*  CO2 23  GLUCOSE 201*  BUN 35*  CREATININE 1.45*  CALCIUM 8.9  MG 1.7  AST 19  ALT 14  ALKPHOS 50  BILITOT 0.5   RADIOLOGY:  No results found. ASSESSMENT AND PLAN:   This is a 67 year old female admitted for pneumonia. 1.    Acute respiratory failure with hypoxia due to pneumonia: Healthcare associated; Continue O2 [place], off HF O2, continue cefepime and vancomycin is discontinued since MRSA is negative.  DuoNeb every 6 hours.  Negative blood culture so far.  2.  Interstitial lung disease: The patient has emphysematous changes as well as fibrosis and groundglass opacities.  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  Follow-up Dr. Humphrey Rolls as outpatient.  3.  Hypertension:controlled; continue hydralazine, [place], amlodipine and carvedilol.  4.  Hypothyroidism: continue Synthroid  5.  Chronic diastolic CHF: Secondary to venous hypertension; continue Lasix per home  regimen  6.  Hyperlipidemia: Continue statin therapy.  ARF on CKD stage III.  Improved. Follow-up BMP while on Lasix.    Anemia of chronic disease.  Hemoglobin  decreased to 6.9.  S/P PRBC transfusion, hemoglobin 7.9.  No active bleeding.  ARF due to dehydration secondary to diuretics. Follow-up BMP while on Lasix.  I discussed with Dr. Jefferson Fuel. All the records are reviewed and case discussed with Care Management/Social Worker. Management plans discussed with the patient, family and they are in agreement.  CODE STATUS: Partial Code  TOTAL TIME TAKING CARE OF THIS PATIENT: 35 minutes.   More than 50% of the time was spent in counseling/coordination of care: YES  POSSIBLE D/C IN 3 DAYS, DEPENDING ON CLINICAL CONDITION.   Demetrios Loll M.D on 06/05/2018 at 4:30 PM  Between 7am to 6pm - Pager - [phone]  After 6pm go to [email or web address] - [facility]

## 2018-06-05 NOTE — Progress Notes (Signed)
Follow up - Critical Care Medicine Note  Patient Details:    Melissa Knox is an 67 y.o. female.with a past medical history remarkable for hypertension, hyperlipidemia, hypothyroidism, chronic renal insufficiency, congestive heart failure, coronary artery disease, combination bullous lung disease with fibrosis presents with increasing shortness of breath and desaturation.  Patient usually uses 2 L of oxygen at home.  Upon arrival to the emergency department she was found to be somnolent and hypoxemic.  She was placed initially on nonrebreather and subsequently admitted to the floor.  Unfortunately she developed worsening shortness of breath and was transferred from floor to the intensive care unit on noninvasive ventilation.  Presently she is resting comfortably, awake, alert, communicating states that she feels much better.  Denies any recent fever, chills, night sweats or sputum production  Lines, Airways, Drains:    Anti-infectives:  Anti-infectives (From admission, onward)   Start     Dose/Rate Route Frequency Ordered Stop   06/04/18 0800  vancomycin (VANCOCIN) IVPB 1000 mg/200 mL premix     1,000 mg 200 mL/hr over 60 Minutes Intravenous Every 36 hours 06/03/18 0642     06/03/18 1000  ceFEPIme (MAXIPIME) 1 g in sodium chloride 0.9 % 100 mL IVPB     1 g 200 mL/hr over 30 Minutes Intravenous Every 12 hours 06/03/18 0757 06/11/18 0959   06/03/18 0715  vancomycin (VANCOCIN) IVPB 1000 mg/200 mL premix     1,000 mg 200 mL/hr over 60 Minutes Intravenous  Once 06/03/18 3762 06/03/18 1328      Microbiology: Results for orders placed or performed during the hospital encounter of 06/03/18  MRSA PCR Screening     Status: None   Collection Time: 06/03/18 10:07 AM  Result Value Ref Range Status   MRSA by PCR NEGATIVE NEGATIVE Final    Comment:        The GeneXpert MRSA Assay (FDA approved for NASAL specimens only), is one component of a comprehensive MRSA colonization surveillance program.  It is not intended to diagnose MRSA infection nor to guide or monitor treatment for MRSA infections. Performed at [facility], [address] [place], [place] [address]    Studies: Ct Chest High Resolution  Result Date: 05/08/2018 CLINICAL DATA:  Worsening shortness of breath on exertion for 1 year. EXAM: CT CHEST WITHOUT CONTRAST TECHNIQUE: Multidetector CT imaging of the chest was performed following the standard protocol without intravenous contrast. High resolution imaging of the lungs, as well as inspiratory and expiratory imaging, was performed. COMPARISON:  04/11/2018 and additional CT chest examinations dating back to 08/24/2012. FINDINGS: Cardiovascular: Atherosclerotic calcification of the arterial vasculature, including coronary arteries and aortic valve. Heart is enlarged. No pericardial effusion. Mediastinum/Nodes: Mediastinal lymph nodes measure up to 1.5 cm in the low right paratracheal station, similar. Hilar regions are difficult to evaluate without IV contrast. No axillary adenopathy. Fluid is seen in the esophagus. Lungs/Pleura: Bullous centrilobular and paraseptal emphysema. Consolidation and ground-glass in the posterior segment right upper lobe and superior segment right lower lobe are similar to 04/11/2018 but appear rather progressive from 05/28/2017. Basilar predominant traction bronchiectasis/bronchiolectasis. Small right pleural effusion, minimally increased from 04/11/2018. Airway is otherwise unremarkable. Upper Abdomen: Visualized portions of the liver and adrenal glands are unremarkable. Punctate stone in the right kidney. Scarring in the upper pole right kidney. Visualized portions of the kidneys, spleen, pancreas, stomach and bowel are grossly unremarkable. Cholecystectomy. Calcified lymph nodes in the periportal region. Musculoskeletal: No worrisome lytic or sclerotic lesions. IMPRESSION: 1. Patchy area  of consolidation and ground-glass in the posterior segment  right upper lobe and superior segment right lower lobe, grossly stable from 04/11/2018 but progressive from 05/28/2017. Findings are worrisome for primary bronchogenic carcinoma. 2. Small right pleural effusion, minimally increased from 04/11/2018. 3. 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 4. Aortic atherosclerosis (ICD10-170.0). Coronary artery calcification. 5. Punctate right renal stone. Electronically Signed   By: Lorin Picket M.D.   On: 05/08/2018 09:23   Dg Chest Port 1 View  Result Date: 06/03/2018 CLINICAL DATA:  67 y/o  F; shortness of breath and hypoxemia. EXAM: PORTABLE CHEST 1 VIEW COMPARISON:  05/08/2018 CT chest. 04/11/2018 chest radiograph. FINDINGS: Stable cardiac silhouette given projection and technique. Calcific aortic atherosclerosis. Stable medial aspect of right upper lobe consolidation. Stable findings of emphysema and coarse reticulation with basilar predominance compatible with fibrosis. Interval increase in right-sided pleural effusion. Right upper quadrant cholecystectomy clips. No acute osseous abnormality identified. IMPRESSION: Interval increase in right-sided pleural effusion. Stable right upper lobe consolidation. Stable findings of emphysema, fibrosis, aortic atherosclerosis. Electronically Signed   By: Kristine Garbe M.D.   On: 06/03/2018 06:10    Consults:    Subjective:    Overnight Issues: Patient was successfully weaned off of BiPAP, presently on heated high flow at 50%, states she is feeling much better  Objective:  Vital signs for last 24 hours: Temp:  [97.5 F (36.4 C)-98.6 F (37 C)] 97.5 F (36.4 C) (10/07 0824) Pulse Rate:  [61-140] 66 (10/07 0900) Resp:  [15-25] 23 (10/07 0900) BP: (115-140)/(42-80) 123/45 (10/07 0900) SpO2:  [92 %-100 %] 99 % (10/07 0900) FiO2 (%):  [40 %-45 %] 44 % (10/07 0824) Weight:  [64.2 kg] 64.2 kg (10/07 0500)  Hemodynamic parameters for  last 24 hours:    Intake/Output from previous day: 10/06 0701 - 10/07 0700 In: -  Out: 350 [Urine:350]  Intake/Output this shift: No intake/output data recorded.  Vent settings for last 24 hours: FiO2 (%):  [40 %-45 %] 44 %  Physical Exam:   Vital signs:       Please see the above listed vital signs HEENT:           Trachea is midline, no accessory muscle utilization presently using heated high flow  Cardiovascular:           Regular rate and rhythm Pulmonary:       Markedly improved wheezing and crackles Abdominal:      Positive bowel sounds, soft exam Extremities:     No clubbing, cyanosis or edema noted Neurologic:      No focal deficits noted Assessment/Plan:   Respiratory failure.  Patient on vancomycin, cefepime, Solu-Medrol, albuterol, Atrovent, budesonide, wean as tolerated heated high flow.   She has had a history and is followed by Dr. Humphrey Rolls in the outpatient setting as her Primary Pulmonologist for interstitial pneumonia, groundglass, fibrosis, cystic/bullous changes.  She has had ANCA positivity and has seen rheumatology in the past she also has a lower lobe nodule that she is being followed in the outpatient setting.  although work-up would be difficult and therapeutic secondary to her severe respiratory limitation.  She denies any hemoptysis  Renal insufficiency.  Ending a.m. labs  Anemia.  Pending a.m. labs  Leukocytosis resolved, on cefepime and vancomycin.  She was negative we will stop vancomycin and continue cefepime for a 7-day course   Jermani Eberlein 06/05/2018  *Care during the described time interval was provided by me and/or other providers on the  critical care team.  I have reviewed this patient's available data, including medical history, events of note, physical examination and test results as part of my evaluation. Patient ID: Melissa Knox, female   DOB: 04-Jan-1951, 67 y.o.   MRN: [id number]

## 2018-06-05 NOTE — Progress Notes (Signed)
Patient on 4L [place] SAT at 98%. Bipap and HHFNC on standby in room.

## 2018-06-05 NOTE — Plan of Care (Signed)
Patient continues on HFNC @ 45%.  Patient has no reserve on exertion.  Oxygen saturation decreases to 70s.  Significant time before oxygen saturation patient returns to >90%.  Patient has no c/o discomfort.  Able to make needs known.  No acute distress noted.  Will continue to monitor.

## 2018-06-06 LAB — HEMOGLOBIN A1C
Hgb A1c MFr Bld: 5.9 % — ABNORMAL HIGH (ref 4.8–5.6)
Mean Plasma Glucose: 123 mg/dL

## 2018-06-06 MED ORDER — IPRATROPIUM-ALBUTEROL 0.5-2.5 (3) MG/3ML IN SOLN: 3.0000 mL | Freq: Four times a day (QID) | RESPIRATORY_TRACT | Status: DC | PRN

## 2018-06-06 MED ORDER — TIOTROPIUM BROMIDE MONOHYDRATE 18 MCG IN CAPS
18.0000 ug | ORAL_CAPSULE | Freq: Every day | RESPIRATORY_TRACT | Status: DC
  Administered 2018-06-06 – 2018-06-07 (×2): 18 ug via RESPIRATORY_TRACT
  Filled 2018-06-06 (×2): qty 5

## 2018-06-06 MED ORDER — FLUTICASONE FUROATE-VILANTEROL 200-25 MCG/INH IN AEPB
1.0000 | INHALATION_SPRAY | Freq: Every day | RESPIRATORY_TRACT | Status: DC
  Administered 2018-06-06 – 2018-06-08 (×3): 1 via RESPIRATORY_TRACT
  Filled 2018-06-06: qty 28

## 2018-06-06 NOTE — Progress Notes (Signed)
Moved to room 211 by chair.  Alert with no distress noted.

## 2018-06-06 NOTE — Progress Notes (Signed)
Report called to [place], RN on [place].

## 2018-06-06 NOTE — Progress Notes (Signed)
During rounds spoke with Dr. Jefferson Fuel about patient's heart rate and rhythm.  Patient has been NSR and sinus brady rate 58-70's but has quick bursts of tachy beats.  MD gave order to send patient to floor with tele monitor and gave orders to discontinue several antihypertensives.

## 2018-06-06 NOTE — Clinical Social Work Note (Signed)
CSW consulted for difficulty to pay for medications. RN CM to be notified. Shela Leff MSW,LCSW [phone]

## 2018-06-06 NOTE — Progress Notes (Signed)
[place] [facility] [place] NAME: Melissa Knox    MR#:  [id number]  DATE OF BIRTH:  06-Sep-1950  SUBJECTIVE:  CHIEF COMPLAINT:   Chief Complaint  Patient presents with  . Altered Mental Status   Pt has better shortness of breath, on O2 [place] 4 L. REVIEW OF SYSTEMS:  Review of Systems  Constitutional: Negative for chills, fever and malaise/fatigue.  HENT: Negative for sore throat.   Eyes: Negative for blurred vision and double vision.  Respiratory: Positive for shortness of breath. Negative for cough, hemoptysis, sputum production, wheezing and stridor.   Cardiovascular: Negative for chest pain, palpitations, orthopnea and leg swelling.  Gastrointestinal: Negative for abdominal pain, blood in stool, diarrhea, melena, nausea and vomiting.  Genitourinary: Negative for dysuria, flank pain and hematuria.  Musculoskeletal: Negative for back pain and joint pain.  Skin: Negative for rash.  Neurological: Negative for dizziness, sensory change, focal weakness, seizures, loss of consciousness, weakness and headaches.  Endo/Heme/Allergies: Negative for polydipsia.  Psychiatric/Behavioral: Negative for depression. The patient is not nervous/anxious.     DRUG ALLERGIES:  No Known Allergies VITALS:  Blood pressure (!) 123/48, pulse (!) 57, temperature 98.4 F (36.9 C), temperature source Oral, resp. rate (!) 23, height 5' (1.524 m), weight 67.7 kg, SpO2 100 %. PHYSICAL EXAMINATION:  Physical Exam  Constitutional: She is oriented to person, place, and time. No distress.  HENT:  Head: Normocephalic.  Mouth/Throat: Oropharynx is clear and moist.  Eyes: Pupils are equal, round, and reactive to light. Conjunctivae and EOM are normal. No scleral icterus.  Neck: Normal range of motion. Neck supple. No JVD present. No tracheal deviation present.  Cardiovascular: Normal rate, regular rhythm and normal heart sounds. Exam reveals no gallop.  No murmur  heard. Pulmonary/Chest: Effort normal. No stridor. No respiratory distress. She has no wheezes. She has no rales.  Diminished lung sounds, crackles on the left side.  Abdominal: Soft. Bowel sounds are normal. She exhibits no distension. There is no tenderness. There is no rebound.  Musculoskeletal: Normal range of motion. She exhibits no edema or tenderness.  Neurological: She is alert and oriented to person, place, and time. No cranial nerve deficit.  Skin: No rash noted. She is not diaphoretic. No erythema.  Psychiatric: She has a normal mood and affect.   LABORATORY PANEL:  Female CBC Recent Labs  Lab 06/05/18 0919  WBC 13.3*  HGB 7.9*  HCT 25.4*  PLT 625*   ------------------------------------------------------------------------------------------------------------------ Chemistries  Recent Labs  Lab 06/05/18 0919  NA 143  K 3.6  CL 112*  CO2 23  GLUCOSE 201*  BUN 35*  CREATININE 1.45*  CALCIUM 8.9  MG 1.7  AST 19  ALT 14  ALKPHOS 50  BILITOT 0.5   RADIOLOGY:  No results found. ASSESSMENT AND PLAN:   This is a 67 year old female admitted for pneumonia. 1.    Acute respiratory failure with hypoxia due to pneumonia: Healthcare associated; Try to wean down O2 [place] to baseline 2 L, off HF O2, continue cefepime. vancomycin is discontinued since MRSA is negative.  DuoNeb every 6 hours.  Negative blood culture so far.  2.  Interstitial lung disease: The patient has emphysematous changes as well as fibrosis and groundglass opacities.  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  Follow-up Dr. Humphrey Rolls as outpatient.  3.  Hypertension:controlled; continue hydralazine, Statin, amlodipine and carvedilol.  4.  Hypothyroidism: continue Synthroid  5.  Chronic diastolic CHF: Secondary to venous hypertension; continue  Lasix per home regimen  6.  Hyperlipidemia: Continue statin therapy.  ARF on CKD stage III. Follow-up BMP while on Lasix.    Anemia of chronic disease.   Hemoglobin decreased to 6.9.  S/P PRBC transfusion, hemoglobin 7.9.  No active bleeding.  ARF due to dehydration secondary to diuretics. Follow-up BMP while on Lasix.  I discussed with Dr. Jefferson Fuel. All the records are reviewed and case discussed with Care Management/Social Worker. Management plans discussed with the patient, family and they are in agreement.  CODE STATUS: Partial Code  TOTAL TIME TAKING CARE OF THIS PATIENT: 35 minutes.   More than 50% of the time was spent in counseling/coordination of care: YES  POSSIBLE D/C IN 2-3 DAYS, DEPENDING ON CLINICAL CONDITION.   Demetrios Loll M.D on 06/06/2018 at 5:01 PM  Between 7am to 6pm - Pager - [phone]  After 6pm go to [email or web address] - [facility]

## 2018-06-06 NOTE — Progress Notes (Signed)
Follow up - Critical Care Medicine Note  Patient Details:    Melissa Knox is an 67 y.o. female.with a past medical history remarkable for hypertension, hyperlipidemia, hypothyroidism, chronic renal insufficiency, congestive heart failure, coronary artery disease, combination bullous lung disease with fibrosis presents with increasing shortness of breath and desaturation.  Patient usually uses 2 L of oxygen at home.  Upon arrival to the emergency department she was found to be somnolent and hypoxemic.  She was placed initially on nonrebreather and subsequently admitted to the floor.  Unfortunately she developed worsening shortness of breath and was transferred from floor to the intensive care unit on noninvasive ventilation.  Presently she is resting comfortably, awake, alert, communicating states that she feels much better.  Denies any recent fever, chills, night sweats or sputum production  Lines, Airways, Drains:    Anti-infectives:  Anti-infectives (From admission, onward)   Start     Dose/Rate Route Frequency Ordered Stop   06/05/18 2200  ceFEPIme (MAXIPIME) 2 g in sodium chloride 0.9 % 100 mL IVPB     2 g 200 mL/hr over 30 Minutes Intravenous Every 12 hours 06/05/18 1704 06/11/18 0959   06/04/18 0800  vancomycin (VANCOCIN) IVPB 1000 mg/200 mL premix  Status:  Discontinued     1,000 mg 200 mL/hr over 60 Minutes Intravenous Every 36 hours 06/03/18 0642 06/05/18 0910   06/03/18 1000  ceFEPIme (MAXIPIME) 1 g in sodium chloride 0.9 % 100 mL IVPB  Status:  Discontinued     1 g 200 mL/hr over 30 Minutes Intravenous Every 12 hours 06/03/18 0757 06/05/18 1704   06/03/18 0715  vancomycin (VANCOCIN) IVPB 1000 mg/200 mL premix     1,000 mg 200 mL/hr over 60 Minutes Intravenous  Once 06/03/18 1610 06/03/18 1328      Microbiology: Results for orders placed or performed during the hospital encounter of 06/03/18  MRSA PCR Screening     Status: None   Collection Time: 06/03/18 10:07 AM  Result  Value Ref Range Status   MRSA by PCR NEGATIVE NEGATIVE Final    Comment:        The GeneXpert MRSA Assay (FDA approved for NASAL specimens only), is one component of a comprehensive MRSA colonization surveillance program. It is not intended to diagnose MRSA infection nor to guide or monitor treatment for MRSA infections. Performed at [facility], [address] [place], [place] [address]    Studies: Ct Chest High Resolution  Result Date: 05/08/2018 CLINICAL DATA:  Worsening shortness of breath on exertion for 1 year. EXAM: CT CHEST WITHOUT CONTRAST TECHNIQUE: Multidetector CT imaging of the chest was performed following the standard protocol without intravenous contrast. High resolution imaging of the lungs, as well as inspiratory and expiratory imaging, was performed. COMPARISON:  04/11/2018 and additional CT chest examinations dating back to 08/24/2012. FINDINGS: Cardiovascular: Atherosclerotic calcification of the arterial vasculature, including coronary arteries and aortic valve. Heart is enlarged. No pericardial effusion. Mediastinum/Nodes: Mediastinal lymph nodes measure up to 1.5 cm in the low right paratracheal station, similar. Hilar regions are difficult to evaluate without IV contrast. No axillary adenopathy. Fluid is seen in the esophagus. Lungs/Pleura: Bullous centrilobular and paraseptal emphysema. Consolidation and ground-glass in the posterior segment right upper lobe and superior segment right lower lobe are similar to 04/11/2018 but appear rather progressive from 05/28/2017. Basilar predominant traction bronchiectasis/bronchiolectasis. Small right pleural effusion, minimally increased from 04/11/2018. Airway is otherwise unremarkable. Upper Abdomen: Visualized portions of the liver and adrenal glands are unremarkable. Punctate  stone in the right kidney. Scarring in the upper pole right kidney. Visualized portions of the kidneys, spleen, pancreas, stomach and bowel are  grossly unremarkable. Cholecystectomy. Calcified lymph nodes in the periportal region. Musculoskeletal: No worrisome lytic or sclerotic lesions. IMPRESSION: 1. Patchy area of consolidation and ground-glass in the posterior segment right upper lobe and superior segment right lower lobe, grossly stable from 04/11/2018 but progressive from 05/28/2017. Findings are worrisome for primary bronchogenic carcinoma. 2. Small right pleural effusion, minimally increased from 04/11/2018. 3. 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 4. Aortic atherosclerosis (ICD10-170.0). Coronary artery calcification. 5. Punctate right renal stone. Electronically Signed   By: Lorin Picket M.D.   On: 05/08/2018 09:23   Dg Chest Port 1 View  Result Date: 06/03/2018 CLINICAL DATA:  67 y/o  F; shortness of breath and hypoxemia. EXAM: PORTABLE CHEST 1 VIEW COMPARISON:  05/08/2018 CT chest. 04/11/2018 chest radiograph. FINDINGS: Stable cardiac silhouette given projection and technique. Calcific aortic atherosclerosis. Stable medial aspect of right upper lobe consolidation. Stable findings of emphysema and coarse reticulation with basilar predominance compatible with fibrosis. Interval increase in right-sided pleural effusion. Right upper quadrant cholecystectomy clips. No acute osseous abnormality identified. IMPRESSION: Interval increase in right-sided pleural effusion. Stable right upper lobe consolidation. Stable findings of emphysema, fibrosis, aortic atherosclerosis. Electronically Signed   By: Kristine Garbe M.D.   On: 06/03/2018 06:10    Consults:    Subjective:    Overnight Issues: Patient presently resting comfortably on nasal cannula  Objective:  Vital signs for last 24 hours: Temp:  [97.5 F (36.4 C)-98.2 F (36.8 C)] 98.2 F (36.8 C) (10/08 0818) Pulse Rate:  [51-102] 77 (10/08 0800) Resp:  [14-26] 24 (10/08 0800) BP: (115-158)/(42-76)  144/62 (10/08 0800) SpO2:  [64 %-100 %] 96 % (10/08 0827) Weight:  [67.7 kg] 67.7 kg (10/08 0500)  Hemodynamic parameters for last 24 hours:    Intake/Output from previous day: 10/07 0701 - 10/08 0700 In: 580 [P.O.:580] Out: 625 [Urine:625]  Intake/Output this shift: No intake/output data recorded.  Vent settings for last 24 hours:    Physical Exam:   Vital signs:       Please see the above listed vital signs HEENT:           Trachea is midline, no accessory muscle utilization presently using heated high flow  Cardiovascular:           Regular rate and rhythm Pulmonary:       Markedly improved wheezing and crackles Abdominal:      Positive bowel sounds, soft exam Extremities:     No clubbing, cyanosis or edema noted Neurologic:      No focal deficits noted Assessment/Plan:   Respiratory failure.  Patient on cefepime, weaning Solu-Medrol, albuterol, Atrovent, budesonide, wean as tolerated heated high flow.   Will change to Thomasena Edis  She has had a history and is followed by Dr. Humphrey Rolls in the outpatient setting as her Primary Pulmonologist for interstitial pneumonia, groundglass, fibrosis, cystic/bullous changes.  She has had ANCA positivity and has seen rheumatology in the past she also has a lower lobe nodule that she is being followed in the outpatient setting.  although work-up would be difficult and therapeutic secondary to her severe respiratory limitation.  She denies any hemoptysis  Renal insufficiency.  BUN 35/creatinine 1.45  Anemia.  No evidence of active bleeding, Hemoccult and 7.9  Leukocytosis 13.3 on cefepime .   This point patient is stable for floor transfer  Melissa Knox 06/06/2018  *Care during the described time interval was provided by me and/or other providers on the critical care team.  I have reviewed this patient's available data, including medical history, events of note, physical examination and test results as part of my evaluation. Patient  ID: Melissa Knox, female   DOB: 11-01-1950, 67 y.o.   MRN: [id number] Patient ID: Melissa Knox, female   DOB: 03-27-1951, 67 y.o.   MRN: [id number]

## 2018-06-07 LAB — BASIC METABOLIC PANEL
Anion gap: 7 (ref 5–15)
BUN: 38 mg/dL — AB (ref 8–23)
CALCIUM: 8.7 mg/dL — AB (ref 8.9–10.3)
CO2: 24 mmol/L (ref 22–32)
Chloride: 110 mmol/L (ref 98–111)
Creatinine, Ser: 1.35 mg/dL — ABNORMAL HIGH (ref 0.44–1.00)
GFR calc Af Amer: 46 mL/min — ABNORMAL LOW (ref >60)
GFR, EST NON AFRICAN AMERICAN: 40 mL/min — AB (ref >60)
GLUCOSE: 124 mg/dL — AB (ref 70–99)
Potassium: 3.3 mmol/L — ABNORMAL LOW (ref 3.5–5.1)
Sodium: 141 mmol/L (ref 135–145)

## 2018-06-07 LAB — MAGNESIUM: Magnesium: 1.9 mg/dL (ref 1.7–2.4)

## 2018-06-07 MED ORDER — POTASSIUM CHLORIDE CRYS ER 20 MEQ PO TBCR
40.0000 meq | EXTENDED_RELEASE_TABLET | Freq: Once | ORAL | Status: AC
  Administered 2018-06-07: 40 meq via ORAL
  Filled 2018-06-07: qty 2

## 2018-06-07 MED ORDER — GUAIFENESIN 100 MG/5ML PO SOLN
5.0000 mL | ORAL | Status: DC | PRN
  Filled 2018-06-07: qty 5

## 2018-06-07 MED ORDER — CEFPODOXIME PROXETIL 200 MG PO TABS: 200.0000 mg | ORAL_TABLET | Freq: Two times a day (BID) | ORAL | 0 refills | Status: DC

## 2018-06-07 MED ORDER — GUAIFENESIN 100 MG/5ML PO SOLN: 5.0000 mL | ORAL | 0 refills | Status: DC | PRN

## 2018-06-07 NOTE — Discharge Instructions (Signed)
Continue home O2 [place] 2-3 L HHPT

## 2018-06-07 NOTE — Progress Notes (Signed)
Dr Bridgett Larsson informed of patients sats dropping to low 80% on 3 liters when walking.  D/C cancelled

## 2018-06-07 NOTE — Care Management Important Message (Signed)
Copy of signed IM left with patient in room.  

## 2018-06-07 NOTE — Progress Notes (Signed)
[place] [facility] [place] NAME: Melissa Knox    MR#:  [id number]  DATE OF BIRTH:  [birth year]  SUBJECTIVE:  CHIEF COMPLAINT:   Chief Complaint  Patient presents with  . Altered Mental Status   Pt has much better shortness of breath, on O2 [place] 3 L.  She is on home oxygen 2 to 3 L.  She want to go home.  But she is hypoxia at 82% on exertion with 3 L oxygen. REVIEW OF SYSTEMS:  Review of Systems  Constitutional: Negative for chills, fever and malaise/fatigue.  HENT: Negative for sore throat.   Eyes: Negative for blurred vision and double vision.  Respiratory: Positive for shortness of breath. Negative for cough, hemoptysis, sputum production, wheezing and stridor.   Cardiovascular: Negative for chest pain, palpitations, orthopnea and leg swelling.  Gastrointestinal: Negative for abdominal pain, blood in stool, diarrhea, melena, nausea and vomiting.  Genitourinary: Negative for dysuria, flank pain and hematuria.  Musculoskeletal: Negative for back pain and joint pain.  Skin: Negative for rash.  Neurological: Negative for dizziness, sensory change, focal weakness, seizures, loss of consciousness, weakness and headaches.  Endo/Heme/Allergies: Negative for polydipsia.  Psychiatric/Behavioral: Negative for depression. The patient is not nervous/anxious.     DRUG ALLERGIES:  No Known Allergies VITALS:  Blood pressure (!) 130/44, pulse 73, temperature 97.7 F (36.5 C), resp. rate 20, height 5' (1.524 m), weight 69.2 kg, SpO2 96 %. PHYSICAL EXAMINATION:  Physical Exam  Constitutional: She is oriented to person, place, and time. No distress.  HENT:  Head: Normocephalic.  Mouth/Throat: Oropharynx is clear and moist.  Eyes: Pupils are equal, round, and reactive to light. Conjunctivae and EOM are normal. No scleral icterus.  Neck: Normal range of motion. Neck supple. No JVD present. No tracheal deviation present.  Cardiovascular: Normal rate,  regular rhythm and normal heart sounds. Exam reveals no gallop.  No murmur heard. Pulmonary/Chest: Effort normal. No stridor. No respiratory distress. She has no wheezes. She has no rales.  Diminished lung sounds, crackles on the left side.  Abdominal: Soft. Bowel sounds are normal. She exhibits no distension. There is no tenderness. There is no rebound.  Musculoskeletal: Normal range of motion. She exhibits no edema or tenderness.  Neurological: She is alert and oriented to person, place, and time. No cranial nerve deficit.  Skin: No rash noted. She is not diaphoretic. No erythema.  Psychiatric: She has a normal mood and affect.   LABORATORY PANEL:  Female CBC Recent Labs  Lab 06/05/18 0919  WBC 13.3*  HGB 7.9*  HCT 25.4*  PLT 625*   ------------------------------------------------------------------------------------------------------------------ Chemistries  Recent Labs  Lab 06/05/18 0919 06/07/18 0430  NA 143 141  K 3.6 3.3*  CL 112* 110  CO2 23 24  GLUCOSE 201* 124*  BUN 35* 38*  CREATININE 1.45* 1.35*  CALCIUM 8.9 8.7*  MG 1.7 1.9  AST 19  --   ALT 14  --   ALKPHOS 50  --   BILITOT 0.5  --    RADIOLOGY:  No results found. ASSESSMENT AND PLAN:   This is a 67 year old female admitted for pneumonia. 1.    Acute respiratory failure with hypoxia due to pneumonia: Healthcare associated; Weaned down O2 [place] to 3 L, off HF O2, still hypoxia on 3 L. continue cefepime. vancomycin is discontinued since MRSA is negative.  DuoNeb every 6 hours.  Negative blood culture so far.  2.  Interstitial lung disease: The patient has  emphysematous changes as well as fibrosis and groundglass opacities.  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  Follow-up Dr. Humphrey Rolls as outpatient.  3.  Hypertension:controlled; continue hydralazine, Statin, amlodipine and carvedilol.  4.  Hypothyroidism: continue Synthroid  5.  Chronic diastolic CHF: Secondary to venous hypertension; continue  Lasix per home regimen  6.  Hyperlipidemia: Continue statin therapy.  ARF on CKD stage III. Follow-up BMP while on Lasix.  Improving.  Anemia of chronic disease.  Hemoglobin decreased to 6.9.  S/P PRBC transfusion, hemoglobin 7.9.  No active bleeding.  All the records are reviewed and case discussed with Care Management/Social Worker. Management plans discussed with the patient, family and they are in agreement.  CODE STATUS: Partial Code  TOTAL TIME TAKING CARE OF THIS PATIENT: 32 minutes.   More than 50% of the time was spent in counseling/coordination of care: YES  POSSIBLE D/C IN 2 DAYS, DEPENDING ON CLINICAL CONDITION.   Demetrios Loll M.D on 06/07/2018 at 4:38 PM  Between 7am to 6pm - Pager - [phone]  After 6pm go to [email or web address] - [facility]

## 2018-06-07 NOTE — Progress Notes (Signed)
Physical Therapy Evaluation Patient Details Name: Melissa Knox MRN: [id number] DOB: [birth year] Today's Date: 06/07/2018   History of Present Illness  The patient with past medical history of hypertension, heart disease and chronic kidney disease presents to the emergency department due to somnolence and decreased arousability.  Patient was found to have pulse oximetry of 5% despite supplemental oxygen.  It turns out that her oxygen concentrator as well as portable oxygen tank were depleted.  She was placed on a nonrebreather mask in the emergency department at 100% FiO2 with brought her oxygen saturations up to normal.  The patient reports that she has been using it 5 to 6 L of oxygen all week. She denies fevers but admits to cough.  She was found to have leukocytosis as well as persistent tachypnea and elevated troponin which prompted the emergency department staff to call the hospitalist service for admission. She is currently admitted for acute respiratory failure with hypoxia secondary to PNA.   Clinical Impression  Pt admitted with above diagnosis. Pt currently with functional limitations due to the deficits listed below (see PT Problem List). Pt is independent with bed mobility and only requires supervision for transfers. She is able to ambulate partially around RN station with therapist. Once in the hall and ambulating SaO2 checked and has dropped to 78% on 3L/min O2. Pt given 2 minutes but SaO2 does not exceed 85% so supplemental O2 increased to 4L/min. After another minute SaO2 recovers to 92%. SaO2 drops again when returning to room but recovers quicker as pt is left on 4L/min. Gait speed is decreased but functional for limited community mobility. No external assist required to stabilize. Good balance noted with single leg balance of approximately 10s on RLE. Will continue to follow patient during admission however no further PT needs after discharge. Per recommendation by pulmonologist she may  benefit from pulmonary rehab.       Follow Up Recommendations No PT follow up;Other (comment)(Per pulmonology may benefit from pulmonary rehab)    Equipment Recommendations  None recommended by PT    Recommendations for Other Services       Precautions / Restrictions Precautions Precautions: None Restrictions Weight Bearing Restrictions: No      Mobility  Bed Mobility Overal bed mobility: Independent             General bed mobility comments: Fair speed and sequencing  Transfers Overall transfer level: Needs assistance Equipment used: None Transfers: Sit to/from Stand Sit to Stand: Supervision         General transfer comment: Pt demonstrates safe hand placement during transfers. Mildly slow but fair stability once standing  Ambulation/Gait Ambulation/Gait assistance: Supervision Gait Distance (Feet): 200 Feet Assistive device: IV Pole       General Gait Details: Pt is able to ambulate partially around RN station with therapist. Once in the hall and ambulating SaO2 checked and has dropped to 78% on 3L/min O2. Pt given 2 minutes but SaO2 does not exceed 85% so supplemental O2 increased to 4L/min. After another minute SaO2 recovers to 92%. SaO2 drops again when returning to room but recovers quicker as pt is left on 4L/min. Gait speed is decreased but functional for limited community mobility. No external assist required to stabilize.   Stairs            Wheelchair Mobility    Modified Rankin (Stroke Patients Only)       Balance Overall balance assessment: Needs assistance Sitting-balance support: No upper extremity supported Sitting balance-Leahy  Scale: Good     Standing balance support: No upper extremity supported Standing balance-Leahy Scale: Good Standing balance comment: Single leg balance is 10s on RLE                             Pertinent Vitals/Pain Pain Assessment: No/denies pain    Home Living Family/patient expects to  be discharged to:: Private residence Living Arrangements: Spouse/significant other Available Help at Discharge: Family Type of Home: House Home Access: Level entry     Home Layout: One level Home Equipment: Environmental consultant - 2 wheels;Cane - single point;Shower seat;Other (comment)(Home O2 at 2-3L/min all the time)      Prior Function Level of Independence: Independent with assistive device(s)         Comments: Pt uses spc prn for ambulation. 1 fall in the last 12 months. Independent with ADLs/IADLs     Hand Dominance   Dominant Hand: Right    Extremity/Trunk Assessment   Upper Extremity Assessment Upper Extremity Assessment: Overall WFL for tasks assessed    Lower Extremity Assessment Lower Extremity Assessment: Overall WFL for tasks assessed(Mild L ankle dorsiflexion weakness at 4-/5)       Communication   Communication: No difficulties  Cognition Arousal/Alertness: Awake/alert Behavior During Therapy: WFL for tasks assessed/performed Overall Cognitive Status: Within Functional Limits for tasks assessed                                        General Comments      Exercises     Assessment/Plan    PT Assessment Patient needs continued PT services  PT Problem List Decreased strength;Decreased mobility;Cardiopulmonary status limiting activity       PT Treatment Interventions DME instruction;Gait training;Functional mobility training;Therapeutic activities;Therapeutic exercise;Balance training    PT Goals (Current goals can be found in the Care Plan section)  Acute Rehab PT Goals Patient Stated Goal: Return to prior level of function PT Goal Formulation: With patient Time For Goal Achievement: 06/21/18 Potential to Achieve Goals: Good    Frequency Min 2X/week   Barriers to discharge        Co-evaluation               AM-PAC PT "6 Clicks" Daily Activity  Outcome Measure Difficulty turning over in bed (including adjusting bedclothes,  sheets and blankets)?: None Difficulty moving from lying on back to sitting on the side of the bed? : None Difficulty sitting down on and standing up from a chair with arms (e.g., wheelchair, bedside commode, etc,.)?: None Help needed moving to and from a bed to chair (including a wheelchair)?: None Help needed walking in hospital room?: None Help needed climbing 3-5 steps with a railing? : None 6 Click Score: 24    End of Session Equipment Utilized During Treatment: Gait belt Activity Tolerance: Patient tolerated treatment well Patient left: in bed;with call bell/phone within reach   PT Visit Diagnosis: Difficulty in walking, not elsewhere classified (R26.2)    Time: 1101-1115 PT Time Calculation (min) (ACUTE ONLY): 14 min   Charges:   PT Evaluation $PT Eval Low Complexity: 1 Low          Abbeygail Igoe D Kayani Rapaport PT, DPT, GCS   Jasn Xia 06/07/2018, 12:10 PM

## 2018-06-08 ENCOUNTER — Ambulatory Visit: Payer: Self-pay | Admitting: Nurse Practitioner

## 2018-06-08 NOTE — Care Management Note (Signed)
Case Management Note  Patient Details  Name: Melissa Knox MRN: [id number] Date of Birth: 1951-06-23   Patient to discharge home today.  Patient lives at home with husband.  PCP [place].  Patient states that she obtains her medications from [facility] and denies any issues getting them.  Patient has chronic O2 through [place]. Daughter to bring portable tank at discharge.  MD has orders home health with CHF protocol.  Patient agreeable to home health services and states she does not have a  preference of agency.  Referral made to [facility] with [place].  Heart failure protocol signed and provided to liaison.  Copy of protocol sent to medical records.  RNCM signing off.   Subjective/Objective:                    Action/Plan:   Expected Discharge Date:  06/08/18               Expected Discharge Plan:  [place]  In-House Referral:     Discharge planning Services  CM Consult  Post Acute Care Choice:  Home Health Choice offered to:  Patient  DME Arranged:    DME Agency:     HH Arranged:  RN [place] Agency:  [place]  Status of Service:  Completed, signed off  If discussed at [place] of Stay Meetings, dates discussed:    Additional Comments:  Beverly Sessions, RN 06/08/2018, 3:18 PM

## 2018-06-08 NOTE — Discharge Summary (Signed)
[facility] [place] NAME: Melissa Knox    MR#:  [id number]  DATE OF BIRTH:  12-14-1950  DATE OF ADMISSION:  06/03/2018 ADMITTING PHYSICIAN: Harrie Foreman, MD  DATE OF DISCHARGE: 06/08/2018  PRIMARY CARE PHYSICIAN: Ronnell Freshwater, NP    ADMISSION DIAGNOSIS:  Pleural effusion on right [J90] Acute respiratory failure with hypoxia (HCC) [J96.01] Elevated troponin I level [R79.89] Chronic kidney disease, unspecified CKD stage [N18.9] Acute on chronic anemia [D64.9]  DISCHARGE DIAGNOSIS:  Active Problems:   HCAP (healthcare-associated pneumonia)   SECONDARY DIAGNOSIS:   Past Medical History:  Diagnosis Date  . Atherosclerotic heart disease 06/12/2015  . CHF (congestive heart failure) ([place])   . Chronic congestive heart failure with left ventricular diastolic dysfunction ([place]) 04/12/2018  . CKD (chronic kidney disease) stage 2, GFR 60-89 ml/min 06/12/2015  . COPD (chronic obstructive pulmonary disease) ([place])   . Hypercalcemia 06/12/2015  . Hyperlipidemia 06/12/2015  . Hypertension 06/12/2015  . Hypothyroidism 06/12/2015  . Insomnia 06/12/2015  . Interstitial pneumonia ([place]) 04/12/2018  . 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 Shortness of breath 06/12/2015  . Solitary pulmonary nodule 06/12/2015    HOSPITAL COURSE:    67 year old female with a history of chronic hypoxic respiratory failure with chronic diastolic heart disease and chronic kidney disease stage III who presented to the emergency room due to shortness of breath.  1.  Acute on chronic hypoxic respiratory failure due to healthcare associated pneumonia and underlying interstitial lung disease.  Patient has been weaned to 3 L of oxygen which is her baseline. Patient will continue antibiotics for pneumonia. 2.  ILD: Patient will follow-up with Dr. Humphrey Rolls 3.  Essential hypertension: Patient will continue outpatient regimen including Coreg, Norvasc and  hydralazine  4.  Hypothyroidism: Continue Synthroid  5.  Chronic diastolic heart failure with preserved ejection fraction and pulmonary hypertension: Patient is referred to CHF clinic upon discharge     DISCHARGE CONDITIONS AND DIET:   Fair condition on heart healthy diet  CONSULTS OBTAINED:    DRUG ALLERGIES:  No Known Allergies  DISCHARGE MEDICATIONS:   Allergies as of 06/08/2018   No Known Allergies     Medication List    STOP taking these medications   hydrALAZINE 50 MG tablet Commonly known as:  APRESOLINE   losartan 100 MG tablet Commonly known as:  COZAAR     TAKE these medications   albuterol (2.5 MG/3ML) 0.083% nebulizer solution Commonly known as:  PROVENTIL Take 2.5 mg by nebulization every 6 (six) hours as needed for wheezing or shortness of breath.   amLODipine 10 MG tablet Commonly known as:  NORVASC Take 1 tablet (10 mg total) by mouth daily.   aspirin EC 81 MG tablet Take 81 mg by mouth daily.   atorvastatin 10 MG tablet Commonly known as:  LIPITOR Take 1 tablet (10 mg total) by mouth daily at 6 PM.   budesonide-formoterol 160-4.5 MCG/ACT inhaler Commonly known as:  SYMBICORT Inhale 2 puffs into the lungs 2 (two) times daily.   carvedilol 6.25 MG tablet Commonly known as:  COREG Take 6.25 mg by mouth 2 (two) times daily.   cefpodoxime 200 MG tablet Commonly known as:  VANTIN Take 1 tablet (200 mg total) by mouth 2 (two) times daily.   docusate sodium 100 MG capsule Commonly known as:  COLACE Take 1 capsule (100 mg total) by mouth 2 (two) times daily as needed for mild constipation.   furosemide 20  MG tablet Commonly known as:  LASIX Take 1 tablet (20 mg total) by mouth daily.   guaiFENesin 100 MG/5ML Soln Commonly known as:  ROBITUSSIN Take 5 mLs (100 mg total) by mouth every 4 (four) hours as needed for cough or to loosen phlegm.   levothyroxine 50 MCG tablet Commonly known as:  SYNTHROID, LEVOTHROID Take 1 tablet (50 mcg  total) by mouth daily before breakfast.   loratadine 10 MG tablet Commonly known as:  CLARITIN Take 1 tablet (10 mg total) by mouth daily. What changed:    when to take this  reasons to take this   meclizine 12.5 MG tablet Commonly known as:  ANTIVERT Take 12.5 mg by mouth 3 (three) times daily as needed for dizziness.   mirtazapine 15 MG tablet Commonly known as:  REMERON Take 0.5 tablets (7.5 mg total) by mouth at bedtime.   OXYGEN Inhale into the lungs. 2 litre   pantoprazole 40 MG tablet Commonly known as:  PROTONIX Take 1 tablet (40 mg total) by mouth 2 (two) times daily.   sucralfate 1 g tablet Commonly known as:  CARAFATE Take 1 tablet (1 g total) by mouth 4 (four) times daily -  with meals and at bedtime.         Today   CHIEF COMPLAINT:  Patient is ready to go home.  Shortness of breath is improved.   VITAL SIGNS:  Blood pressure (!) 138/54, pulse 74, temperature 98.2 F (36.8 C), temperature source Oral, resp. rate 18, height 5' (1.524 m), weight 68.2 kg, SpO2 96 %.   REVIEW OF SYSTEMS:  Review of Systems  Constitutional: Negative.  Negative for chills, fever and malaise/fatigue.  HENT: Negative.  Negative for ear discharge, ear pain, hearing loss, nosebleeds and sore throat.   Eyes: Negative.  Negative for blurred vision and pain.  Respiratory: Negative.  Negative for cough, hemoptysis, shortness of breath and wheezing.   Cardiovascular: Negative.  Negative for chest pain, palpitations and leg swelling.  Gastrointestinal: Negative.  Negative for abdominal pain, blood in stool, diarrhea, nausea and vomiting.  Genitourinary: Negative.  Negative for dysuria.  Musculoskeletal: Negative.  Negative for back pain.  Skin: Negative.   Neurological: Negative for dizziness, tremors, speech change, focal weakness, seizures and headaches.  Endo/Heme/Allergies: Negative.  Does not bruise/bleed easily.  Psychiatric/Behavioral: Negative.  Negative for  depression, hallucinations and suicidal ideas.     PHYSICAL EXAMINATION:  GENERAL:  67 y.o.-year-old patient lying in the bed with no acute distress.  NECK:  Supple, no jugular venous distention. No thyroid enlargement, no tenderness.  LUNGS: Normal breath sounds bilaterally, no wheezing, rales,rhonchi  No use of accessory muscles of respiration.  CARDIOVASCULAR: S1, S2 normal. No murmurs, rubs, or gallops.  ABDOMEN: Soft, non-tender, non-distended. Bowel sounds present. No organomegaly or mass.  EXTREMITIES: No pedal edema, cyanosis, or clubbing.  PSYCHIATRIC: The patient is alert and oriented x 3.  SKIN: No obvious rash, lesion, or ulcer.   DATA REVIEW:   CBC Recent Labs  Lab 06/05/18 0919  WBC 13.3*  HGB 7.9*  HCT 25.4*  PLT 625*    Chemistries  Recent Labs  Lab 06/05/18 0919 06/07/18 0430  NA 143 141  K 3.6 3.3*  CL 112* 110  CO2 23 24  GLUCOSE 201* 124*  BUN 35* 38*  CREATININE 1.45* 1.35*  CALCIUM 8.9 8.7*  MG 1.7 1.9  AST 19  --   ALT 14  --   ALKPHOS 50  --  BILITOT 0.5  --     Cardiac Enzymes Recent Labs  Lab 06/03/18 1033 06/03/18 1659 06/03/18 2205  TROPONINI 0.05* 0.03* <0.03    Microbiology Results  @MICRORSLT48 @  RADIOLOGY:  No results found.    Allergies as of 06/08/2018   No Known Allergies     Medication List    STOP taking these medications   hydrALAZINE 50 MG tablet Commonly known as:  APRESOLINE   losartan 100 MG tablet Commonly known as:  COZAAR     TAKE these medications   albuterol (2.5 MG/3ML) 0.083% nebulizer solution Commonly known as:  PROVENTIL Take 2.5 mg by nebulization every 6 (six) hours as needed for wheezing or shortness of breath.   amLODipine 10 MG tablet Commonly known as:  NORVASC Take 1 tablet (10 mg total) by mouth daily.   aspirin EC 81 MG tablet Take 81 mg by mouth daily.   atorvastatin 10 MG tablet Commonly known as:  LIPITOR Take 1 tablet (10 mg total) by mouth daily at 6 PM.    budesonide-formoterol 160-4.5 MCG/ACT inhaler Commonly known as:  SYMBICORT Inhale 2 puffs into the lungs 2 (two) times daily.   carvedilol 6.25 MG tablet Commonly known as:  COREG Take 6.25 mg by mouth 2 (two) times daily.   cefpodoxime 200 MG tablet Commonly known as:  VANTIN Take 1 tablet (200 mg total) by mouth 2 (two) times daily.   docusate sodium 100 MG capsule Commonly known as:  COLACE Take 1 capsule (100 mg total) by mouth 2 (two) times daily as needed for mild constipation.   furosemide 20 MG tablet Commonly known as:  LASIX Take 1 tablet (20 mg total) by mouth daily.   guaiFENesin 100 MG/5ML Soln Commonly known as:  ROBITUSSIN Take 5 mLs (100 mg total) by mouth every 4 (four) hours as needed for cough or to loosen phlegm.   levothyroxine 50 MCG tablet Commonly known as:  SYNTHROID, LEVOTHROID Take 1 tablet (50 mcg total) by mouth daily before breakfast.   loratadine 10 MG tablet Commonly known as:  CLARITIN Take 1 tablet (10 mg total) by mouth daily. What changed:    when to take this  reasons to take this   meclizine 12.5 MG tablet Commonly known as:  ANTIVERT Take 12.5 mg by mouth 3 (three) times daily as needed for dizziness.   mirtazapine 15 MG tablet Commonly known as:  REMERON Take 0.5 tablets (7.5 mg total) by mouth at bedtime.   OXYGEN Inhale into the lungs. 2 litre   pantoprazole 40 MG tablet Commonly known as:  PROTONIX Take 1 tablet (40 mg total) by mouth 2 (two) times daily.   sucralfate 1 g tablet Commonly known as:  CARAFATE Take 1 tablet (1 g total) by mouth 4 (four) times daily -  with meals and at bedtime.          Management plans discussed with the patient and she is in agreement. Stable for discharge home  Patient should follow up with pcp  CODE STATUS:     Code Status Orders  (From admission, onward)         Start     Ordered   06/03/18 0956  Limited resuscitation (code)  Continuous    Question Answer  Comment  In the event of cardiac or respiratory ARREST: Initiate Code Blue, Call Rapid Response Yes   In the event of cardiac or respiratory ARREST: Perform CPR Yes   In the event of cardiac or  respiratory ARREST: Perform Intubation/Mechanical Ventilation No   In the event of cardiac or respiratory ARREST: Use NIPPV/BiPAp only if indicated Yes   In the event of cardiac or respiratory ARREST: Administer ACLS medications if indicated Yes   In the event of cardiac or respiratory ARREST: Perform Defibrillation or Cardioversion if indicated Yes      06/03/18 0955        Code Status History    Date Active Date Inactive Code Status Order ID Comments User Context   06/03/2018 0757 06/03/2018 0955 Full Code [id number]  Harrie Foreman, MD Inpatient   04/11/2018 1256 04/14/2018 1807 Full Code [id number]  Vaughan Basta, MD ED   05/29/2017 1024 05/30/2017 1819 DNR [id number]  Hillary Bow, MD Inpatient   05/28/2017 2323 05/29/2017 1024 Full Code [id number]  Quintella Baton, MD Inpatient   02/06/2017 1525 02/08/2017 1404 Full Code [id number]  Idelle Crouch, MD ED      TOTAL TIME TAKING CARE OF THIS PATIENT: 38 minutes.    Note: This dictation was prepared with Dragon dictation along with smaller phrase technology. Any transcriptional errors that result from this process are unintentional.  Atilano Covelli M.D on 06/08/2018 at 12:10 PM  Between 7am to 6pm - Pager - [phone] After 6pm go to [email or web address] - password [email or web address] [facility]  Office  [phone]  CC: Primary care physician; Ronnell Freshwater, NP

## 2018-06-08 NOTE — Progress Notes (Signed)
SATURATION QUALIFICATIONS: (This note is used to comply with regulatory documentation for home oxygen)  Patient Saturations on Room Air at Rest = 60% Patient Saturations on 3 liters at rest=94%  Patient Saturations on 4 Liters of oxygen while Ambulating = 91%  Please briefly explain why patient needs home oxygen: patient quickly de-sats when oxygen is not on

## 2018-06-12 ENCOUNTER — Ambulatory Visit (INDEPENDENT_AMBULATORY_CARE_PROVIDER_SITE_OTHER): Payer: Medicare Other | Admitting: Adult Health

## 2018-06-12 ENCOUNTER — Encounter: Payer: Self-pay | Admitting: Adult Health

## 2018-06-12 VITALS — BP 152/70 | HR 86 | Temp 98.6°F | Resp 16 | Ht 60.0 in | Wt 143.0 lb

## 2018-06-12 DIAGNOSIS — J449 Chronic obstructive pulmonary disease, unspecified: Secondary | ICD-10-CM

## 2018-06-12 DIAGNOSIS — J9611 Chronic respiratory failure with hypoxia: Secondary | ICD-10-CM

## 2018-06-12 DIAGNOSIS — J189 Pneumonia, unspecified organism: Secondary | ICD-10-CM | POA: Diagnosis not present

## 2018-06-12 DIAGNOSIS — Z23 Encounter for immunization: Secondary | ICD-10-CM | POA: Diagnosis not present

## 2018-06-12 DIAGNOSIS — I10 Essential (primary) hypertension: Secondary | ICD-10-CM

## 2018-06-12 DIAGNOSIS — Z9981 Dependence on supplemental oxygen: Secondary | ICD-10-CM | POA: Diagnosis not present

## 2018-06-12 NOTE — Progress Notes (Signed)
[facility] [place], [place] [address]  Internal MEDICINE  Office Visit Note  Patient Name: Melissa Knox  [id number]  [id number]  Date of Service: 06/12/2018     Chief Complaint  Patient presents with  . Pneumonia    feeling better  . Hospitalization Follow-up     HPI Pt is here for recent hospital follow up.  She was recently admitted to the hospital for pneumonia.  She was admitted for 5 days. She was hypoxic in the emergency department with a saturation at one point that was 27%.  She required BIPAP initially.  Her lugs were clear, so the decision was made to stop the bipap, and she did well on oxygen via [place].  She was discharged home on steroids and azithromycin.  During this visit today she is feeling much better.  Current Medication: Outpatient Encounter Medications as of 06/12/2018  Medication Sig  . albuterol (PROVENTIL) (2.5 MG/3ML) 0.083% nebulizer solution Take 2.5 mg by nebulization every 6 (six) hours as needed for wheezing or shortness of breath.  Marland Kitchen amLODipine (NORVASC) 10 MG tablet Take 1 tablet (10 mg total) by mouth daily.  Marland Kitchen aspirin EC 81 MG tablet Take 81 mg by mouth daily.   Marland Kitchen atorvastatin (LIPITOR) 10 MG tablet Take 1 tablet (10 mg total) by mouth daily at 6 PM.  . budesonide-formoterol (SYMBICORT) 160-4.5 MCG/ACT inhaler Inhale 2 puffs into the lungs 2 (two) times daily.  . carvedilol (COREG) 6.25 MG tablet Take 6.25 mg by mouth 2 (two) times daily.  . cefpodoxime (VANTIN) 200 MG tablet Take 1 tablet (200 mg total) by mouth 2 (two) times daily.  Marland Kitchen docusate sodium (COLACE) 100 MG capsule Take 1 capsule (100 mg total) by mouth 2 (two) times daily as needed for mild constipation.  . furosemide (LASIX) 20 MG tablet Take 1 tablet (20 mg total) by mouth daily.  Mar[place] Kitchen guaiFENesin (ROBITUSSIN) 100 MG/5ML SOLN Take 5 mLs (100 mg total) by mouth every 4 (four) hours as needed for cough or to loosen phlegm.  Marland Kitchen levothyroxine (SYNTHROID, LEVOTHROID)  50 MCG tablet Take 1 tablet (50 mcg total) by mouth daily before breakfast.  . loratadine (CLARITIN) 10 MG tablet Take 1 tablet (10 mg total) by mouth daily. (Patient taking differently: Take 10 mg by mouth daily as needed. )  . meclizine (ANTIVERT) 12.5 MG tablet Take 12.5 mg by mouth 3 (three) times daily as needed for dizziness.   . mirtazapine (REMERON) 15 MG tablet Take 0.5 tablets (7.5 mg total) by mouth at bedtime.  . OXYGEN Inhale into the lungs. 2 litre  . pantoprazole (PROTONIX) 40 MG tablet Take 1 tablet (40 mg total) by mouth 2 (two) times daily.  . sucralfate (CARAFATE) 1 g tablet Take 1 tablet (1 g total) by mouth 4 (four) times daily -  with meals and at bedtime.   No facility-administered encounter medications on file as of 06/12/2018.     Surgical History: Past Surgical History:  Procedure Laterality Date  . GALLBLADDER SURGERY  2012    Medical History: Past Medical History:  Diagnosis Date  . Atherosclerotic heart disease 06/12/2015  . CHF (congestive heart failure) ([place])   . Chronic congestive heart failure with left ventricular diastolic dysfunction ([place]) 04/12/2018  . CKD (chronic kidney disease) stage 2, GFR 60-89 ml/min 06/12/2015  . COPD (chronic obstructive pulmonary disease) ([place])   . Hypercalcemia 06/12/2015  . Hyperlipidemia 06/12/2015  . Hypertension 06/12/2015  . Hypothyroidism 06/12/2015  . Insomnia  06/12/2015  . Interstitial pneumonia ([place]) 04/12/2018  . 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 Shortness of breath 06/12/2015  . Solitary pulmonary nodule 06/12/2015    Family History: Family History  Problem Relation Age of Onset  . Cancer Mother   . Hypertension Father   . Diabetes Father   . Cancer Father     Social History   Socioeconomic History  . Marital status: Married    Spouse name: Daryl  . Number of children: 3  . Years of education: 34  . Highest education level: 12th grade  Occupational History  . Not on file  Social  Needs  . Financial resource strain: Not hard at all  . Food insecurity:    Worry: Never true    Inability: Never true  . Transportation needs:    Medical: No    Non-medical: No  Tobacco Use  . Smoking status: Former Smoker    Last attempt to quit: 2013    Years since quitting: 6.7  . Smokeless tobacco: Never Used  Substance and Sexual Activity  . Alcohol use: No    Alcohol/week: 0.0 standard drinks  . Drug use: No  . Sexual activity: Yes    Birth control/protection: None  Lifestyle  . Physical activity:    Days per week: 2 days    Minutes per session: 30 min  . Stress: Not at all  Relationships  . Social connections:    Talks on phone: Twice a week    Gets together: Once a week    Attends religious service: 1 to 4 times per year    Active member of club or organization: No    Attends meetings of clubs or organizations: Never    Relationship status: Married  . Intimate partner violence:    Fear of current or ex partner: No    Emotionally abused: No    Physically abused: No    Forced sexual activity: No  Other Topics Concern  . Not on file  Social History Narrative  . Not on file      Review of Systems  Constitutional: Negative for chills, fatigue and unexpected weight change.  HENT: Negative for congestion, rhinorrhea, sneezing and sore throat.   Eyes: Negative for photophobia, pain and redness.  Respiratory: Positive for shortness of breath. Negative for cough and chest tightness.   Cardiovascular: Negative for chest pain and palpitations.  Gastrointestinal: Negative for abdominal pain, constipation, diarrhea, nausea and vomiting.  Endocrine: Negative.   Genitourinary: Negative for dysuria and frequency.  Musculoskeletal: Negative for arthralgias, back pain, joint swelling and neck pain.  Skin: Negative for rash.  Allergic/Immunologic: Negative.   Neurological: Negative for tremors and numbness.  Hematological: Negative for adenopathy. Does not bruise/bleed  easily.  Psychiatric/Behavioral: Negative for behavioral problems and sleep disturbance. The patient is not nervous/anxious.     Vital Signs: BP (!) 152/70   Pulse 86   Temp 98.6 F (37 C)   Resp 16   Ht 5' (1.524 m)   Wt 143 lb (64.9 kg)   SpO2 93%   BMI 27.93 kg/m    Physical Exam  Constitutional: She is oriented to person, place, and time. She appears well-developed and well-nourished. No distress.  HENT:  Head: Normocephalic and atraumatic.  Mouth/Throat: Oropharynx is clear and moist. No oropharyngeal exudate.  Eyes: Pupils are equal, round, and reactive to light. EOM are normal.  Neck: Normal range of motion. Neck supple. No JVD present. No tracheal deviation present. No  thyromegaly present.  Cardiovascular: Normal rate, regular rhythm and normal heart sounds. Exam reveals no gallop and no friction rub.  No murmur heard. Pulmonary/Chest: Effort normal and breath sounds normal. No respiratory distress. She has no wheezes. She has no rales. She exhibits no tenderness.  Mild friction rub noted.   Abdominal: Soft. There is no tenderness. There is no guarding.  Musculoskeletal: Normal range of motion.  Lymphadenopathy:    She has no cervical adenopathy.  Neurological: She is alert and oriented to person, place, and time. No cranial nerve deficit.  Skin: Skin is warm and dry. She is not diaphoretic.  Psychiatric: She has a normal mood and affect. Her behavior is normal. Judgment and thought content normal.  Nursing note and vitals reviewed.  Assessment/Plan: 1. Pneumonia due to infectious organism, unspecified laterality, unspecified part of lung Treated in hospital.  Pt denies cough, sob or further issues. Lungs clear to auscultation.  She also has completed a Z-Pak post hospital admission.  Instructed patient that if symptoms return to please return to clinic for further evaluation.  2. Chronic respiratory failure with hypoxia (HCC) Continue to use Oxygen as directed.     3. Obstructive chronic bronchitis without exacerbation ([place]) Continue pre-hospital treatment regimen.  Return to clinic for new or worsening symptoms.   4. Essential hypertension Slightly elevated today, will continue to monitor.  5. Oxygen dependent Continue current treatment of 2 LPM via [place] continuously.   6. Flu vaccine need - Flu Vaccine MDCK QUAD PF  General Counseling: Galina verbalizes understanding of the findings of todays visit and agrees with plan of treatment. I have discussed any further diagnostic evaluation that may be needed or ordered today. We also reviewed her medications today. she has been encouraged to call the office with any questions or concerns that should arise related to todays visit.    Orders Placed This Encounter  Procedures  . Flu Vaccine MDCK QUAD PF      I have reviewed all medical records from hospital follow up including radiology reports and consults from other physicians. Appropriate follow up diagnostics will be scheduled as needed. Patient/ Family understands the plan of treatment.  Time spent 25 minutes.  Kendell Bane AGNP-C Internal Medicine

## 2018-06-12 NOTE — Patient Instructions (Signed)
Hypoxia Hypoxia is a condition that happens when there is a lack of oxygen in the body's tissues and organs. When there is not enough oxygen, organs cannot work as they should. This causes serious problems throughout the body and in the brain. What are the causes? This condition may be caused by:  Exposure to high altitude.  A collapsed lung (pneumothorax).  Lung infection (pneumonia).  Lung injury.  Long-term (chronic) lung disease, such as COPD (chronic obstructive pulmonary disease).  Blood collecting in the chest cavity (hemothorax).  Food, saliva, or vomit getting into the airway (aspiration).  Reduced blood flow (ischemia).  Severe blood loss.  Slow or shallow breathing (hypoventilation).  Blood disorders, such as anemia.  Carbon monoxide poisoning.  The heart suddenly stopping (cardiac arrest).  Anesthetic medicines.  Drowning.  Choking.  What are the signs or symptoms? Symptoms of this condition include:  Headache.  Fatigue.  Drowsiness.  Forgetfulness.  Nausea.  Confusion.  Shortness of breath.  Dizziness.  Bluish color of the skin, lips, or nail beds (cyanosis).  Change in consciousness or awareness.  If hypoxia is not treated, it can lead to convulsions, loss of consciousness (coma), or brain damage. How is this diagnosed? This condition may be diagnosed based on:  A physical exam.  Blood tests.  A test that measures how much oxygen is in your blood (pulse oximetry). This is done with a sensor that is placed on your finger, toe, or earlobe.  Chest X-ray.  Tests to check your lung function (pulmonary function tests).  A test to check the electrical activity of your heart (electrocardiogram, ECG).  You may have other tests to determine the cause of your hypoxia. How is this treated? Treatment for this condition depends on what is causing the hypoxia. You will likely be treated with oxygen therapy. This may be done by giving you  oxygen through a face mask or through tubes in your nose. Your health care provider may also recommend other therapies to treat the underlying cause of your hypoxia. Follow these instructions at home:  Take over-the-counter and prescription medicines only as told by your health care provider.  Do not use any products that contain nicotine or tobacco, such as cigarettes and e-cigarettes. If you need help quitting, ask your health care provider.  Avoid secondhand smoke.  Work with your health care provider to manage any chronic conditions you have that may be causing hypoxia, such as COPD.  Keep all follow-up visits as told by your health care provider. This is important. Contact a health care provider if:  You have a fever.  You have trouble breathing, even after treatment.  You become extremely short of breath when you exercise. Get help right away if:  Your shortness of breath gets worse, especially with normal or very little activity.  Your skin, lips, or nail beds have a bluish color.  You become confused or you cannot think properly.  You have chest pain. Summary  Hypoxia is a condition that happens when there is a lack of oxygen in the body's tissues and organs.  If hypoxia is not treated, it can lead to convulsions, loss of consciousness (coma), or brain damage.  Symptoms of hypoxia can include a headache, shortness of breath, confusion, nausea, and a bluish skin color.  Hypoxia has many possible causes, including exposure to high altitude, carbon monoxide poisoning, or other health issues, such as blood disorders or cardiac arrest.  Hypoxia is usually treated with oxygen therapy. This information   is not intended to replace advice given to you by your health care provider. Make sure you discuss any questions you have with your health care provider. Document Released: 10/04/2016 Document Revised: 10/04/2016 Document Reviewed: 10/04/2016 [employer] Interactive Patient  Education  2018 [employer]  

## 2018-06-16 ENCOUNTER — Other Ambulatory Visit: Payer: Self-pay

## 2018-06-16 MED ORDER — FUROSEMIDE 20 MG PO TABS: 20.0000 mg | ORAL_TABLET | Freq: Every day | ORAL | 3 refills | Status: DC

## 2018-06-20 LAB — BLOOD GAS, VENOUS
Acid-base deficit: 2.1 mmol/L — ABNORMAL HIGH (ref 0.0–2.0)
Bicarbonate: 23.2 mmol/L (ref 20.0–28.0)
FIO2: 100
Patient temperature: 37
pCO2, Ven: 41 mmHg — ABNORMAL LOW (ref 44.0–60.0)
pH, Ven: 7.36 (ref 7.250–7.430)

## 2018-06-22 ENCOUNTER — Ambulatory Visit (INDEPENDENT_AMBULATORY_CARE_PROVIDER_SITE_OTHER): Payer: Medicare Other | Admitting: Internal Medicine

## 2018-06-22 ENCOUNTER — Encounter: Payer: Self-pay | Admitting: Internal Medicine

## 2018-06-22 VITALS — BP 151/58 | HR 92 | Resp 16 | Ht 60.0 in | Wt 140.2 lb

## 2018-06-22 DIAGNOSIS — Z9981 Dependence on supplemental oxygen: Secondary | ICD-10-CM

## 2018-06-22 DIAGNOSIS — J189 Pneumonia, unspecified organism: Secondary | ICD-10-CM | POA: Diagnosis not present

## 2018-06-22 DIAGNOSIS — I10 Essential (primary) hypertension: Secondary | ICD-10-CM | POA: Diagnosis not present

## 2018-06-22 DIAGNOSIS — J9611 Chronic respiratory failure with hypoxia: Secondary | ICD-10-CM

## 2018-06-22 MED ORDER — BUDESONIDE-FORMOTEROL FUMARATE 160-4.5 MCG/ACT IN AERO: 2.0000 | INHALATION_SPRAY | Freq: Two times a day (BID) | RESPIRATORY_TRACT | 2 refills | Status: DC

## 2018-06-22 NOTE — Progress Notes (Signed)
[facility] [place], [place] [address]  Pulmonary Sleep Medicine   Office Visit Note  Patient Name: Melissa Knox DOB: [birth year] MRN [id number]  Date of Service: 06/22/2018  Complaints/HPI: Patient here for follow-up on on COPD, chronic respiratory failure with hypoxemia, and hypertension.  She was admitted to the hospital earlier this month for pneumonia.  She now reports she is doing well.  She continues to use her oxygen 24 hours a day.  She reports that the hospital wanted her to be on Symbicort however she did not a prescription for this.  Generally she has had with her current progress and denies any other needs at this time.  ROS  General: (-) fever, (-) chills, (-) night sweats, (-) weakness Skin: (-) rashes, (-) itching,. Eyes: (-) visual changes, (-) redness, (-) itching. Nose and Sinuses: (-) nasal stuffiness or itchiness, (-) postnasal drip, (-) nosebleeds, (-) sinus trouble. Mouth and Throat: (-) sore throat, (-) hoarseness. Neck: (-) swollen glands, (-) enlarged thyroid, (-) neck pain. Respiratory: - cough, (-) bloody sputum, - shortness of breath, - wheezing. Cardiovascular: - ankle swelling, (-) chest pain. Lymphatic: (-) lymph node enlargement. Neurologic: (-) numbness, (-) tingling. Psychiatric: (-) anxiety, (-) depression   Current Medication: Outpatient Encounter Medications as of 06/22/2018  Medication Sig  . albuterol (PROVENTIL) (2.5 MG/3ML) 0.083% nebulizer solution Take 2.5 mg by nebulization every 6 (six) hours as needed for wheezing or shortness of breath.  Marland Kitchen amLODipine (NORVASC) 10 MG tablet Take 1 tablet (10 mg total) by mouth daily.  Marland Kitchen aspirin EC 81 MG tablet Take 81 mg by mouth daily.   Marland Kitchen atorvastatin (LIPITOR) 10 MG tablet Take 1 tablet (10 mg total) by mouth daily at 6 PM.  . budesonide-formoterol (SYMBICORT) 160-4.5 MCG/ACT inhaler Inhale 2 puffs into the lungs 2 (two) times daily.  . carvedilol (COREG) 6.25 MG tablet  Take 6.25 mg by mouth 2 (two) times daily.  . cefpodoxime (VANTIN) 200 MG tablet Take 1 tablet (200 mg total) by mouth 2 (two) times daily.  Marland Kitchen docusate sodium (COLACE) 100 MG capsule Take 1 capsule (100 mg total) by mouth 2 (two) times daily as needed for mild constipation.  . furosemide (LASIX) 20 MG tablet Take 1 tablet (20 mg total) by mouth daily.  Marland Kitchen guaiFENesin (ROBITUSSIN) 100 MG/5ML SOLN Take 5 mLs (100 mg total) by mouth every 4 (four) hours as needed for cough or to loosen phlegm.  Marland Kitchen levothyroxine (SYNTHROID, LEVOTHROID) 50 MCG tablet Take 1 tablet (50 mcg total) by mouth daily before breakfast.  . loratadine (CLARITIN) 10 MG tablet Take 1 tablet (10 mg total) by mouth daily. (Patient taking differently: Take 10 mg by mouth daily as needed. )  . meclizine (ANTIVERT) 12.5 MG tablet Take 12.5 mg by mouth 3 (three) times daily as needed for dizziness.   . mirtazapine (REMERON) 15 MG tablet Take 0.5 tablets (7.5 mg total) by mouth at bedtime.  . OXYGEN Inhale into the lungs. 2 litre  . pantoprazole (PROTONIX) 40 MG tablet Take 1 tablet (40 mg total) by mouth 2 (two) times daily.  . sucralfate (CARAFATE) 1 g tablet Take 1 tablet (1 g total) by mouth 4 (four) times daily -  with meals and at bedtime.   No facility-administered encounter medications on file as of 06/22/2018.     Surgical History: Past Surgical History:  Procedure Laterality Date  . GALLBLADDER SURGERY  2012    Medical History: Past Medical History:  Diagnosis Date  .  Atherosclerotic heart disease 06/12/2015  . CHF (congestive heart failure) ([place])   . Chronic congestive heart failure with left ventricular diastolic dysfunction ([place]) 04/12/2018  . CKD (chronic kidney disease) stage 2, GFR 60-89 ml/min 06/12/2015  . COPD (chronic obstructive pulmonary disease) ([place])   . Hypercalcemia 06/12/2015  . Hyperlipidemia 06/12/2015  . Hypertension 06/12/2015  . Hypothyroidism 06/12/2015  . Insomnia 06/12/2015  . Interstitial  pneumonia ([place]) 04/12/2018  . 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 Shortness of breath 06/12/2015  . Solitary pulmonary nodule 06/12/2015    Family History: Family History  Problem Relation Age of Onset  . Cancer Mother   . Hypertension Father   . Diabetes Father   . Cancer Father     Social History: Social History   Socioeconomic History  . Marital status: Married    Spouse name: Daryl  . Number of children: 3  . Years of education: 75  . Highest education level: 12th grade  Occupational History  . Not on file  Social Needs  . Financial resource strain: Not hard at all  . Food insecurity:    Worry: Never true    Inability: Never true  . Transportation needs:    Medical: No    Non-medical: No  Tobacco Use  . Smoking status: Former Smoker    Last attempt to quit: 2013    Years since quitting: 6.8  . Smokeless tobacco: Never Used  Substance and Sexual Activity  . Alcohol use: No    Alcohol/week: 0.0 standard drinks  . Drug use: No  . Sexual activity: Yes    Birth control/protection: None  Lifestyle  . Physical activity:    Days per week: 2 days    Minutes per session: 30 min  . Stress: Not at all  Relationships  . Social connections:    Talks on phone: Twice a week    Gets together: Once a week    Attends religious service: 1 to 4 times per year    Active member of club or organization: No    Attends meetings of clubs or organizations: Never    Relationship status: Married  . Intimate partner violence:    Fear of current or ex partner: No    Emotionally abused: No    Physically abused: No    Forced sexual activity: No  Other Topics Concern  . Not on file  Social History Narrative  . Not on file    Vital Signs: Blood pressure (!) 151/58, pulse 92, resp. rate 16, height 5' (1.524 m), weight 140 lb 3.2 oz (63.6 kg), SpO2 94 %.  Examination: General Appearance: The patient is well-developed, well-nourished, and in no distress. Skin: Gross  inspection of skin unremarkable. Head: normocephalic, no gross deformities. Eyes: no gross deformities noted. ENT: ears appear grossly normal no exudates. Neck: Supple. No thyromegaly. No LAD. Respiratory: clear to auscultation. Cardiovascular: Normal S1 and S2 without murmur or rub. Extremities: No cyanosis. pulses are equal. Neurologic: Alert and oriented. No involuntary movements.  LABS: Recent Results (from the past 2160 hour(s))  Comprehensive metabolic panel     Status: Abnormal   Collection Time: 04/11/18  7:51 AM  Result Value Ref Range   Sodium 142 135 - 145 mmol/L   Potassium 3.0 (L) 3.5 - 5.1 mmol/L   Chloride 105 98 - 111 mmol/L   CO2 29 22 - 32 mmol/L   Glucose, Bld 101 (H) 70 - 99 mg/dL   BUN 14 8 - 23 mg/dL  Creatinine, Ser 1.36 (H) 0.44 - 1.00 mg/dL   Calcium 8.4 (L) 8.9 - 10.3 mg/dL   Total Protein 7.8 6.5 - 8.1 g/dL   Albumin 3.3 (L) 3.5 - 5.0 g/dL   AST 16 15 - 41 U/L   ALT 9 0 - 44 U/L   Alkaline Phosphatase 65 38 - 126 U/L   Total Bilirubin 0.5 0.3 - 1.2 mg/dL   GFR calc non Af Amer 40 (L) >60 mL/min   GFR calc Af Amer 46 (L) >60 mL/min    Comment: (NOTE) The eGFR has been calculated using the CKD EPI equation. This calculation has not been validated in all clinical situations. eGFR's persistently <60 mL/min signify possible Chronic Kidney Disease.    Anion gap 8 5 - 15    Comment: Performed at [facility], [address] [place], [place] [address]  CBC with Differential     Status: Abnormal   Collection Time: 04/11/18  7:51 AM  Result Value Ref Range   WBC 10.3 3.6 - 11.0 K/uL   RBC 2.93 (L) 3.80 - 5.20 MIL/uL   Hemoglobin 9.2 (L) 12.0 - 16.0 g/dL   HCT 27.2 (L) 35.0 - 47.0 %   MCV 93.1 80.0 - 100.0 fL   MCH 31.4 26.0 - 34.0 pg   MCHC 33.8 32.0 - 36.0 g/dL   RDW 17.6 (H) 11.5 - 14.5 %   Platelets 421 150 - 440 K/uL   Neutrophils Relative % 73 %   Neutro Abs 7.5 (H) 1.4 - 6.5 K/uL   Lymphocytes Relative 18 %   Lymphs Abs 1.8 1.0  - 3.6 K/uL   Monocytes Relative 6 %   Monocytes Absolute 0.6 0.2 - 0.9 K/uL   Eosinophils Relative 2 %   Eosinophils Absolute 0.2 0 - 0.7 K/uL   Basophils Relative 1 %   Basophils Absolute 0.1 0 - 0.1 K/uL    Comment: Performed at [facility], [address] [place], [place] [address]  Brain natriuretic peptide     Status: Abnormal   Collection Time: 04/11/18  7:51 AM  Result Value Ref Range   B Natriuretic Peptide 328.0 (H) 0.0 - 100.0 pg/mL    Comment: Performed at [facility], [address] [place], [place] [address]  Urinalysis, Complete w Microscopic     Status: Abnormal   Collection Time: 04/11/18  8:17 AM  Result Value Ref Range   Color, Urine YELLOW (A) YELLOW   APPearance CLEAR (A) CLEAR   Specific Gravity, Urine 1.010 1.005 - 1.030   pH 7.0 5.0 - 8.0   Glucose, UA NEGATIVE NEGATIVE mg/dL   Hgb urine dipstick NEGATIVE NEGATIVE   Bilirubin Urine NEGATIVE NEGATIVE   Ketones, ur NEGATIVE NEGATIVE mg/dL   Protein, ur NEGATIVE NEGATIVE mg/dL   Nitrite NEGATIVE NEGATIVE   Leukocytes, UA TRACE (A) NEGATIVE   WBC, UA 6-10 0 - 5 WBC/hpf   Bacteria, UA NONE SEEN NONE SEEN   Squamous Epithelial / LPF 6-10 0 - 5    Comment: Performed at [facility], [address] [place], [place] [address]  Type and screen Ordered by PROVIDER DEFAULT     Status: None   Collection Time: 04/11/18  9:16 AM  Result Value Ref Range   ABO/RH(D) A POS    Antibody Screen NEG    Sample Expiration      04/14/2018 Performed at [facility], [address] [place],  [address]   Basic metabolic panel  Status: Abnormal   Collection Time: 04/12/18  4:47 AM  Result Value Ref Range   Sodium 141 135 - 145 mmol/L   Potassium 3.7 3.5 - 5.1 mmol/L   Chloride 109 98 - 111 mmol/L   CO2 25 22 - 32 mmol/L   Glucose, Bld 168 (H) 70 - 99 mg/dL   BUN 20 8 - 23 mg/dL   Creatinine, Ser 1.49 (H) 0.44 - 1.00 mg/dL   Calcium 8.7 (L) 8.9 - 10.3 mg/dL   GFR calc  non Af Amer 35 (L) >60 mL/min   GFR calc Af Amer 41 (L) >60 mL/min    Comment: (NOTE) The eGFR has been calculated using the CKD EPI equation. This calculation has not been validated in all clinical situations. eGFR's persistently <60 mL/min signify possible Chronic Kidney Disease.    Anion gap 7 5 - 15    Comment: Performed at [facility], [address] [place], [place] [address]  CBC     Status: Abnormal   Collection Time: 04/12/18  4:47 AM  Result Value Ref Range   WBC 5.5 3.6 - 11.0 K/uL   RBC 2.77 (L) 3.80 - 5.20 MIL/uL   Hemoglobin 8.5 (L) 12.0 - 16.0 g/dL   HCT 26.1 (L) 35.0 - 47.0 %   MCV 94.2 80.0 - 100.0 fL   MCH 30.8 26.0 - 34.0 pg   MCHC 32.7 32.0 - 36.0 g/dL   RDW 17.5 (H) 11.5 - 14.5 %   Platelets 394 150 - 440 K/uL    Comment: Performed at [facility], [address] [place], [place] [address]  ANA w/Reflex     Status: Abnormal   Collection Time: 04/12/18  1:14 PM  Result Value Ref Range   Anti Nuclear Antibody(ANA) Positive (A) Negative    Comment: (NOTE) Performed At: [facility] [place], [place] [id number] Rush Farmer MD [id number]   ANCA titers     Status: Abnormal   Collection Time: 04/12/18  1:14 PM  Result Value Ref Range   C-ANCA <1:20 Neg:<1:20 titer   P-ANCA 1:320 (H) Neg:<1:20 titer    Comment: (NOTE) The presence of positive fluorescence exhibiting P-ANCA or C-ANCA patterns alone is not specific for the diagnosis of Wegener's Granulomatosis (WG) or microscopic polyangiitis. Decisions about treatment should not be based solely on ANCA IFA results.  The International ANCA Group Consensus recommends follow up testing of positive sera with both PR-3 and MPO-ANCA enzyme immunoassays. As many as 5% serum samples are positive only by EIA. Ref. AM J Clin Pathol 1999;111:507-513.    Atypical P-ANCA titer <1:20 Neg:<1:20 titer    Comment: (NOTE) The atypical pANCA pattern has been observed in a  significant percentage of patients with ulcerative colitis, primary sclerosing cholangitis and autoimmune hepatitis. Performed At: [facility] [place], [place] [id number] Rush Farmer MD [id number]   Mpo/pr-3 (anca) antibodies     Status: Abnormal   Collection Time: 04/12/18  1:14 PM  Result Value Ref Range   Myeloperoxidase Abs 22.6 (H) 0.0 - 9.0 U/mL   ANCA Proteinase 3 3.6 (H) 0.0 - 3.5 U/mL    Comment: (NOTE) Performed At: [facility] [place], [place] [id number] Rush Farmer MD [id number]   Sedimentation rate     Status: Abnormal   Collection Time: 04/12/18  1:14 PM  Result Value Ref Range   Sed Rate 99 (H) 0 - 30 mm/hr    Comment: Performed at [facility], [place], [place] [address]  Glomerular basement membrane antibodies     Status: None   Collection Time: 04/12/18  1:14 PM  Result Value Ref Range   GBM Ab 4 0 - 20 units    Comment: (NOTE)                   Negative                   0 - 20                   Weak Positive             21 - 30                   Moderate to Strong Positive   >30 Performed At: [facility] [place], [place] [id number] Rush Farmer MD [id number]   Mycoplasma pneumoniae antibody, IgM     Status: None   Collection Time: 04/12/18  1:14 PM  Result Value Ref Range   Mycoplasma pneumo IgM <770 0 - 769 U/mL    Comment: (NOTE)                             Negative            <770 Clinically significant amount of M. pneumoniae antibody not detected.                             Low Positive   770 - 45 M. pneumoniae specific IgM presumptively detected.  It is recommended that another sample be collected 1-2 weeks later to assure reactivity.                             Positive            >950 Highly significant amount of M. pneumoniae specific IgM antibody detected. Performed At: [facility] [place], [place]  [id number] Rush Farmer MD [id number]   ENA+DNA/DS+Sjorgen's     Status: Abnormal   Collection Time: 04/12/18  1:14 PM  Result Value Ref Range   ds DNA Ab 1 0 - 9 IU/mL    Comment: (NOTE)                                   Negative      <5                                   Equivocal  5 - 9                                   Positive      >9    Ribonucleic Protein 0.3 0.0 - 0.9 AI   ENA SM Ab Ser-aCnc <0.2 0.0 - 0.9 AI   SSA (Ro) (ENA) Antibody, IgG >8.0 (H) 0.0 - 0.9 AI   SSB (La) (ENA) Antibody, IgG <0.2 0.0 - 0.9 AI   See below: Comment     Comment: (NOTE) Autoantibody  Disease Association ------------------------------------------------------------                        Condition                  Frequency ---------------------   ------------------------   --------- Antinuclear Antibody,    SLE, mixed connective Direct (ANA-D)           tissue diseases ---------------------   ------------------------   --------- dsDNA                    SLE                        40 - 60% ---------------------   ------------------------   --------- Chromatin                Drug induced SLE                90%                         SLE                        48 - 97% ---------------------   ------------------------   --------- SSA (Ro)                 SLE                        25 - 35%                         Sjogren's Syndrome         40 - 70%                         Neonatal Lupus                 100% ---------------------   ------------------------   --------- SSB (La)                 SLE                              10%                         Sjogren's Syndrome              30% ---------------------   -----------------------    --------- Sm (anti-Smith)          SLE                        15 - 30% ---------------------   -----------------------    --------- RNP                      Mixed Connective Tissue                         Disease                          95% (U1 nRNP,                SLE  30 - 50% anti-ribonucleoprotein)  Polymyositis and/or                         Dermatomyositis                 20% ---------------------   ------------------------   --------- Scl-70 (antiDNA          Scleroderma (diffuse)      20 - 35% topoisomerase)           Crest                           13% ---------------------   ------------------------   --------- Jo-1                     Polymyositis and/or                         Dermatomyositis            20 - 40% ---------------------   ------------------------   --------- Centromere B             Scleroderma -  Crest                         variant                         80% Performed At: [facility] [place], [place] [id number] Rush Farmer MD [id number]   Basic metabolic panel     Status: Abnormal   Collection Time: 04/13/18  5:36 AM  Result Value Ref Range   Sodium 142 135 - 145 mmol/L   Potassium 3.9 3.5 - 5.1 mmol/L   Chloride 111 98 - 111 mmol/L   CO2 25 22 - 32 mmol/L   Glucose, Bld 127 (H) 70 - 99 mg/dL   BUN 30 (H) 8 - 23 mg/dL   Creatinine, Ser 1.56 (H) 0.44 - 1.00 mg/dL   Calcium 8.7 (L) 8.9 - 10.3 mg/dL   GFR calc non Af Amer 34 (L) >60 mL/min   GFR calc Af Amer 39 (L) >60 mL/min    Comment: (NOTE) The eGFR has been calculated using the CKD EPI equation. This calculation has not been validated in all clinical situations. eGFR's persistently <60 mL/min signify possible Chronic Kidney Disease.    Anion gap 6 5 - 15    Comment: Performed at [facility], [address] [place], [place] [address]  Basic metabolic panel     Status: Abnormal   Collection Time: 04/14/18  3:30 AM  Result Value Ref Range   Sodium 141 135 - 145 mmol/L   Potassium 4.1 3.5 - 5.1 mmol/L   Chloride 110 98 - 111 mmol/L   CO2 24 22 - 32 mmol/L   Glucose, Bld 126 (H) 70 - 99 mg/dL   BUN 36 (H) 8 - 23 mg/dL   Creatinine, Ser 1.38 (H) 0.44 - 1.00  mg/dL   Calcium 8.7 (L) 8.9 - 10.3 mg/dL   GFR calc non Af Amer 39 (L) >60 mL/min   GFR calc Af Amer 45 (L) >60 mL/min    Comment: (NOTE) The eGFR has been calculated using the CKD EPI equation. This calculation has not been validated in all clinical situations. eGFR's persistently <60 mL/min signify possible Chronic Kidney Disease.  Anion gap 7 5 - 15    Comment: Performed at [facility], [address] [place], [place] [address]  Pulmonary Function Test     Status: None   Collection Time: 05/10/18 10:30 AM  Result Value Ref Range   FEV1     FVC     FEV1/FVC     TLC     DLCO    Comprehensive metabolic panel     Status: Abnormal   Collection Time: 06/03/18  4:05 AM  Result Value Ref Range   Sodium 140 135 - 145 mmol/L   Potassium 3.7 3.5 - 5.1 mmol/L   Chloride 106 98 - 111 mmol/L   CO2 21 (L) 22 - 32 mmol/L   Glucose, Bld 178 (H) 70 - 99 mg/dL   BUN 25 (H) 8 - 23 mg/dL   Creatinine, Ser 1.64 (H) 0.44 - 1.00 mg/dL   Calcium 8.7 (L) 8.9 - 10.3 mg/dL   Total Protein 7.9 6.5 - 8.1 g/dL   Albumin 3.1 (L) 3.5 - 5.0 g/dL   AST 18 15 - 41 U/L   ALT 9 0 - 44 U/L   Alkaline Phosphatase 64 38 - 126 U/L   Total Bilirubin 0.4 0.3 - 1.2 mg/dL   GFR calc non Af Amer 31 (L) >60 mL/min   GFR calc Af Amer 36 (L) >60 mL/min    Comment: (NOTE) The eGFR has been calculated using the CKD EPI equation. This calculation has not been validated in all clinical situations. eGFR's persistently <60 mL/min signify possible Chronic Kidney Disease.    Anion gap 13 5 - 15    Comment: Performed at [facility], [address] [place], [place] [address]  Lipase, blood     Status: None   Collection Time: 06/03/18  4:05 AM  Result Value Ref Range   Lipase 31 11 - 51 U/L    Comment: Performed at [facility], [address] [place], [place] [address]  Brain natriuretic peptide     Status: Abnormal   Collection Time: 06/03/18  4:05 AM  Result Value Ref Range   B  Natriuretic Peptide 369.0 (H) 0.0 - 100.0 pg/mL    Comment: Performed at [facility], [address] [place], [place] [address]  Troponin I     Status: Abnormal   Collection Time: 06/03/18  4:05 AM  Result Value Ref Range   Troponin I 0.06 (HH) <0.03 ng/mL    Comment: CRITICAL RESULT CALLED TO, READ BACK BY AND VERIFIED WITH NOEL WEBSTER ON 06/03/18 AT [facility] Performed at [facility], [address] [place],  [address]   CBC with Differential     Status: Abnormal   Collection Time: 06/03/18  4:05 AM  Result Value Ref Range   WBC 13.3 (H) 3.6 - 11.0 K/uL   RBC 2.66 (L) 3.80 - 5.20 MIL/uL   Hemoglobin 6.9 (L) 12.0 - 16.0 g/dL   HCT 23.3 (L) 35.0 - 47.0 %   MCV 87.5 80.0 - 100.0 fL   MCH 25.9 (L) 26.0 - 34.0 pg   MCHC 29.6 (L) 32.0 - 36.0 g/dL   RDW 19.2 (H) 11.5 - 14.5 %   Platelets 710 (H) 150 - 440 K/uL   Neutrophils Relative % 69 %   Neutro Abs 9.1 (H) 1.4 - 6.5 K/uL   Lymphocytes Relative 20 %   Lymphs Abs 2.7 1.0 - 3.6 K/uL   Monocytes Relative 6 %   Monocytes  Absolute 0.8 0.2 - 0.9 K/uL   Eosinophils Relative 4 %   Eosinophils Absolute 0.5 0 - 0.7 K/uL   Basophils Relative 1 %   Basophils Absolute 0.1 0 - 0.1 K/uL    Comment: Performed at [facility], [address] [place], [place] [address]  Blood gas, venous     Status: Abnormal   Collection Time: 06/03/18  4:12 AM  Result Value Ref Range   FIO2 100.00    Delivery systems NON-REBREATHER OXYGEN MASK    pH, Ven 7.36 7.250 - 7.430   pCO2, Ven 41 (L) 44.0 - 60.0 mmHg   Bicarbonate 23.2 20.0 - 28.0 mmol/L   Acid-base deficit 2.1 (H) 0.0 - 2.0 mmol/L   Patient temperature 37.0    Collection site VENOUS    Sample type VENOUS     Comment: Performed at [facility], [address], [place], [place] [address]  Prepare RBC     Status: None   Collection Time: 06/03/18  9:59 AM  Result Value Ref Range   Order Confirmation      ORDER PROCESSED BY BLOOD BANK Performed at  [facility], [address] [place], [place] [address]   Glucose, capillary     Status: None   Collection Time: 06/03/18 10:02 AM  Result Value Ref Range   Glucose-Capillary 98 70 - 99 mg/dL  MRSA PCR Screening     Status: None   Collection Time: 06/03/18 10:07 AM  Result Value Ref Range   MRSA by PCR NEGATIVE NEGATIVE    Comment:        The GeneXpert MRSA Assay (FDA approved for NASAL specimens only), is one component of a comprehensive MRSA colonization surveillance program. It is not intended to diagnose MRSA infection nor to guide or monitor treatment for MRSA infections. Performed at [facility], [address] [place], [place] [address]   Type and screen [place]     Status: None   Collection Time: 06/03/18 10:29 AM  Result Value Ref Range   ABO/RH(D) A POS    Antibody Screen NEG    Sample Expiration 06/06/2018    Unit Number [id number]    Blood Component Type RED CELLS,LR    Unit division 00    Status of Unit ISSUED,FINAL    Transfusion Status OK TO TRANSFUSE    Crossmatch Result      Compatible Performed at [facility], [address] [place], [place] [address]   BPAM RBC     Status: None   Collection Time: 06/03/18 10:29 AM  Result Value Ref Range   ISSUE DATE / TIME [id number]    Blood Product Unit Number [id number]    PRODUCT CODE E0382V00    Unit Type and Rh 6200    Blood Product Expiration Date [id number]   TSH     Status: None   Collection Time: 06/03/18 10:33 AM  Result Value Ref Range   TSH 0.527 0.350 - 4.500 uIU/mL    Comment: Performed by a 3rd Generation assay with a functional sensitivity of <=0.01 uIU/mL. Performed at [facility], [address] [place], [place] [address]   Hemoglobin A1c     Status: Abnormal   Collection Time: 06/03/18 10:33 AM  Result Value Ref Range   Hgb A1c MFr Bld 5.9 (H) 4.8 - 5.6 %    Comment: (NOTE)         Prediabetes: 5.7 - 6.4  Diabetes: >6.4         Glycemic control for adults with diabetes: <7.0    Mean Plasma Glucose 123 mg/dL    Comment: (NOTE) Performed At: [facility] [place], [place] [id number] Rush Farmer MD [id number]   Troponin I     Status: Abnormal   Collection Time: 06/03/18 10:33 AM  Result Value Ref Range   Troponin I 0.05 (HH) <0.03 ng/mL    Comment: CRITICAL VALUE NOTED. VALUE IS CONSISTENT WITH PREVIOUSLY REPORTED/CALLED VALUE SNJ Performed at [facility], [address] [place], [place] [address]   HIV antibody (Routine Screening)     Status: None   Collection Time: 06/03/18 10:33 AM  Result Value Ref Range   HIV Screen 4th Generation wRfx Non Reactive Non Reactive    Comment: (NOTE) Performed At: [facility] [place], [place] [id number] Rush Farmer MD [id number]   Procalcitonin - Baseline     Status: None   Collection Time: 06/03/18 10:33 AM  Result Value Ref Range   Procalcitonin 0.19 ng/mL    Comment:        Interpretation: PCT (Procalcitonin) <= 0.5 ng/mL: Systemic infection (sepsis) is not likely. Local bacterial infection is possible. (NOTE)       Sepsis PCT Algorithm           Lower Respiratory Tract                                      Infection PCT Algorithm    ----------------------------     ----------------------------         PCT < 0.25 ng/mL                PCT < 0.10 ng/mL         Strongly encourage             Strongly discourage   discontinuation of antibiotics    initiation of antibiotics    ----------------------------     -----------------------------       PCT 0.25 - 0.50 ng/mL            PCT 0.10 - 0.25 ng/mL               OR       >80% decrease in PCT            Discourage initiation of                                            antibiotics      Encourage discontinuation           of antibiotics    ----------------------------     -----------------------------         PCT >=  0.50 ng/mL              PCT 0.26 - 0.50 ng/mL               AND        <80% decrease in PCT             Encourage initiation of  antibiotics       Encourage continuation           of antibiotics    ----------------------------     -----------------------------        PCT >= 0.50 ng/mL                  PCT > 0.50 ng/mL               AND         increase in PCT                  Strongly encourage                                      initiation of antibiotics    Strongly encourage escalation           of antibiotics                                     -----------------------------                                           PCT <= 0.25 ng/mL                                                 OR                                        > 80% decrease in PCT                                     Discontinue / Do not initiate                                             antibiotics Performed at [facility], [address], [place], [place] [address]   Magnesium     Status: None   Collection Time: 06/03/18 10:33 AM  Result Value Ref Range   Magnesium 1.7 1.7 - 2.4 mg/dL    Comment: Performed at [facility], [address] [place], [place] [address]  Urinalysis, Routine w reflex microscopic     Status: Abnormal   Collection Time: 06/03/18 12:00 PM  Result Value Ref Range   Color, Urine COLORLESS (A) YELLOW   APPearance CLEAR (A) CLEAR   Specific Gravity, Urine 1.005 1.005 - 1.030   pH 6.0 5.0 - 8.0   Glucose, UA NEGATIVE NEGATIVE mg/dL   Hgb urine dipstick NEGATIVE NEGATIVE   Bilirubin Urine NEGATIVE NEGATIVE   Ketones, ur NEGATIVE NEGATIVE mg/dL   Protein, ur NEGATIVE NEGATIVE mg/dL   Nitrite NEGATIVE NEGATIVE   Leukocytes, UA NEGATIVE NEGATIVE    Comment: Performed at [facility], [address] [place], [place] [address]  Strep  pneumoniae urinary antigen     Status: None   Collection Time: 06/03/18 12:00 PM   Result Value Ref Range   Strep Pneumo Urinary Antigen NEGATIVE NEGATIVE    Comment:        Infection due to S. pneumoniae cannot be absolutely ruled out since the antigen present may be below the detection limit of the test. Performed at [facility], [address] [place], [place] [address]   Hemoglobin     Status: Abnormal   Collection Time: 06/03/18  4:59 PM  Result Value Ref Range   Hemoglobin 7.9 (L) 12.0 - 16.0 g/dL    Comment: Performed at [facility], [address] [place], [place] [address]  Troponin I     Status: Abnormal   Collection Time: 06/03/18  4:59 PM  Result Value Ref Range   Troponin I 0.03 (HH) <0.03 ng/mL    Comment: CRITICAL VALUE NOTED. VALUE IS CONSISTENT WITH PREVIOUSLY REPORTED/CALLED VALUE SNJ Performed at [facility], [address] [place], [place] [address]   Troponin I     Status: None   Collection Time: 06/03/18 10:05 PM  Result Value Ref Range   Troponin I <0.03 <0.03 ng/mL    Comment: Performed at [facility], [address] [place], [place] [address]  Procalcitonin     Status: None   Collection Time: 06/04/18  6:11 AM  Result Value Ref Range   Procalcitonin 0.18 ng/mL    Comment:        Interpretation: PCT (Procalcitonin) <= 0.5 ng/mL: Systemic infection (sepsis) is not likely. Local bacterial infection is possible. (NOTE)       Sepsis PCT Algorithm           Lower Respiratory Tract                                      Infection PCT Algorithm    ----------------------------     ----------------------------         PCT < 0.25 ng/mL                PCT < 0.10 ng/mL         Strongly encourage             Strongly discourage   discontinuation of antibiotics    initiation of antibiotics    ----------------------------     -----------------------------       PCT 0.25 - 0.50 ng/mL            PCT 0.10 - 0.25 ng/mL               OR       >80% decrease in PCT            Discourage initiation of                                             antibiotics      Encourage discontinuation           of antibiotics    ----------------------------     -----------------------------         PCT >= 0.50 ng/mL              PCT 0.26 - 0.50 ng/mL  AND        <80% decrease in PCT             Encourage initiation of                                             antibiotics       Encourage continuation           of antibiotics    ----------------------------     -----------------------------        PCT >= 0.50 ng/mL                  PCT > 0.50 ng/mL               AND         increase in PCT                  Strongly encourage                                      initiation of antibiotics    Strongly encourage escalation           of antibiotics                                     -----------------------------                                           PCT <= 0.25 ng/mL                                                 OR                                        > 80% decrease in PCT                                     Discontinue / Do not initiate                                             antibiotics Performed at [facility], [address] [place], [place] [address]   CBC     Status: Abnormal   Collection Time: 06/04/18  6:11 AM  Result Value Ref Range   WBC 6.0 3.6 - 11.0 K/uL   RBC 2.81 (L) 3.80 - 5.20 MIL/uL   Hemoglobin 7.7 (L) 12.0 - 16.0 g/dL   HCT 23.6 (L) 35.0 - 47.0 %   MCV 84.2 80.0 - 100.0 fL   MCH 27.4 26.0 - 34.0 pg   MCHC 32.5 32.0 - 36.0 g/dL   RDW 18.2 (H) 11.5 - 14.5 %  Platelets 558 (H) 150 - 440 K/uL    Comment: Performed at [facility], [address] [place], [place] [address]  Basic metabolic panel     Status: Abnormal   Collection Time: 06/04/18  6:11 AM  Result Value Ref Range   Sodium 139 135 - 145 mmol/L   Potassium 3.3 (L) 3.5 - 5.1 mmol/L   Chloride 105 98 - 111 mmol/L   CO2 25 22 - 32 mmol/L   Glucose, Bld 172 (H) 70 - 99  mg/dL   BUN 23 8 - 23 mg/dL   Creatinine, Ser 1.26 (H) 0.44 - 1.00 mg/dL   Calcium 8.5 (L) 8.9 - 10.3 mg/dL   GFR calc non Af Amer 43 (L) >60 mL/min   GFR calc Af Amer 50 (L) >60 mL/min    Comment: (NOTE) The eGFR has been calculated using the CKD EPI equation. This calculation has not been validated in all clinical situations. eGFR's persistently <60 mL/min signify possible Chronic Kidney Disease.    Anion gap 9 5 - 15    Comment: Performed at [facility], [address] [place], [place] [address]  Procalcitonin     Status: None   Collection Time: 06/05/18  3:55 AM  Result Value Ref Range   Procalcitonin 0.13 ng/mL    Comment:        Interpretation: PCT (Procalcitonin) <= 0.5 ng/mL: Systemic infection (sepsis) is not likely. Local bacterial infection is possible. (NOTE)       Sepsis PCT Algorithm           Lower Respiratory Tract                                      Infection PCT Algorithm    ----------------------------     ----------------------------         PCT < 0.25 ng/mL                PCT < 0.10 ng/mL         Strongly encourage             Strongly discourage   discontinuation of antibiotics    initiation of antibiotics    ----------------------------     -----------------------------       PCT 0.25 - 0.50 ng/mL            PCT 0.10 - 0.25 ng/mL               OR       >80% decrease in PCT            Discourage initiation of                                            antibiotics      Encourage discontinuation           of antibiotics    ----------------------------     -----------------------------         PCT >= 0.50 ng/mL              PCT 0.26 - 0.50 ng/mL               AND        <80% decrease in PCT  Encourage initiation of                                             antibiotics       Encourage continuation           of antibiotics    ----------------------------     -----------------------------        PCT >= 0.50 ng/mL                   PCT > 0.50 ng/mL               AND         increase in PCT                  Strongly encourage                                      initiation of antibiotics    Strongly encourage escalation           of antibiotics                                     -----------------------------                                           PCT <= 0.25 ng/mL                                                 OR                                        > 80% decrease in PCT                                     Discontinue / Do not initiate                                             antibiotics Performed at [facility], [address] [place], [place] [address]   Creatinine, serum     Status: Abnormal   Collection Time: 06/05/18  3:55 AM  Result Value Ref Range   Creatinine, Ser 1.48 (H) 0.44 - 1.00 mg/dL   GFR calc non Af Amer 35 (L) >60 mL/min   GFR calc Af Amer 41 (L) >60 mL/min    Comment: (NOTE) The eGFR has been calculated using the CKD EPI equation. This calculation has not been validated in all clinical situations. eGFR's persistently <60 mL/min signify possible Chronic Kidney Disease. Performed at [facility], [address] [place], [place] [address]   Comprehensive metabolic panel     Status: Abnormal  Collection Time: 06/05/18  9:19 AM  Result Value Ref Range   Sodium 143 135 - 145 mmol/L   Potassium 3.6 3.5 - 5.1 mmol/L   Chloride 112 (H) 98 - 111 mmol/L   CO2 23 22 - 32 mmol/L   Glucose, Bld 201 (H) 70 - 99 mg/dL   BUN 35 (H) 8 - 23 mg/dL   Creatinine, Ser 1.45 (H) 0.44 - 1.00 mg/dL   Calcium 8.9 8.9 - 10.3 mg/dL   Total Protein 7.3 6.5 - 8.1 g/dL   Albumin 2.7 (L) 3.5 - 5.0 g/dL   AST 19 15 - 41 U/L   ALT 14 0 - 44 U/L   Alkaline Phosphatase 50 38 - 126 U/L   Total Bilirubin 0.5 0.3 - 1.2 mg/dL   GFR calc non Af Amer 36 (L) >60 mL/min   GFR calc Af Amer 42 (L) >60 mL/min    Comment: (NOTE) The eGFR has been calculated using the CKD EPI equation. This  calculation has not been validated in all clinical situations. eGFR's persistently <60 mL/min signify possible Chronic Kidney Disease.    Anion gap 8 5 - 15    Comment: Performed at [facility], [address] [place], [place] [address]  CBC with Differential/Platelet     Status: Abnormal   Collection Time: 06/05/18  9:19 AM  Result Value Ref Range   WBC 13.3 (H) 3.6 - 11.0 K/uL   RBC 2.96 (L) 3.80 - 5.20 MIL/uL   Hemoglobin 7.9 (L) 12.0 - 16.0 g/dL   HCT 25.4 (L) 35.0 - 47.0 %   MCV 85.8 80.0 - 100.0 fL   MCH 26.8 26.0 - 34.0 pg   MCHC 31.2 (L) 32.0 - 36.0 g/dL   RDW 19.2 (H) 11.5 - 14.5 %   Platelets 625 (H) 150 - 440 K/uL   Neutrophils Relative % 93 %   Neutro Abs 12.3 (H) 1.4 - 6.5 K/uL   Lymphocytes Relative 5 %   Lymphs Abs 0.7 (L) 1.0 - 3.6 K/uL   Monocytes Relative 2 %   Monocytes Absolute 0.3 0.2 - 0.9 K/uL   Eosinophils Relative 0 %   Eosinophils Absolute 0.0 0 - 0.7 K/uL   Basophils Relative 0 %   Basophils Absolute 0.0 0 - 0.1 K/uL    Comment: Performed at [facility], [address], [place], [place] [address]  Magnesium     Status: None   Collection Time: 06/05/18  9:19 AM  Result Value Ref Range   Magnesium 1.7 1.7 - 2.4 mg/dL    Comment: Performed at [facility], [address] [place], [place] [address]  Basic metabolic panel     Status: Abnormal   Collection Time: 06/07/18  4:30 AM  Result Value Ref Range   Sodium 141 135 - 145 mmol/L   Potassium 3.3 (L) 3.5 - 5.1 mmol/L   Chloride 110 98 - 111 mmol/L   CO2 24 22 - 32 mmol/L   Glucose, Bld 124 (H) 70 - 99 mg/dL   BUN 38 (H) 8 - 23 mg/dL   Creatinine, Ser 1.35 (H) 0.44 - 1.00 mg/dL   Calcium 8.7 (L) 8.9 - 10.3 mg/dL   GFR calc non Af Amer 40 (L) >60 mL/min   GFR calc Af Amer 46 (L) >60 mL/min    Comment: (NOTE) The eGFR has been calculated using the CKD EPI equation. This calculation has not been validated in all clinical situations. eGFR's persistently <60 mL/min  signify possible Chronic Kidney Disease.    Anion gap 7 5 - 15    Comment: Performed at [facility], [address] [place], [place] [address]  Magnesium     Status: None   Collection Time: 06/07/18  4:30 AM  Result Value Ref Range   Magnesium 1.9 1.7 - 2.4 mg/dL    Comment: Performed at [facility], [address] [place], [place] [address]    Radiology: Dg Chest Port 1 View  Result Date: 06/03/2018 CLINICAL DATA:  67 y/o  F; shortness of breath and hypoxemia. EXAM: PORTABLE CHEST 1 VIEW COMPARISON:  05/08/2018 CT chest. 04/11/2018 chest radiograph. FINDINGS: Stable cardiac silhouette given projection and technique. Calcific aortic atherosclerosis. Stable medial aspect of right upper lobe consolidation. Stable findings of emphysema and coarse reticulation with basilar predominance compatible with fibrosis. Interval increase in right-sided pleural effusion. Right upper quadrant cholecystectomy clips. No acute osseous abnormality identified. IMPRESSION: Interval increase in right-sided pleural effusion. Stable right upper lobe consolidation. Stable findings of emphysema, fibrosis, aortic atherosclerosis. Electronically Signed   By: Kristine Garbe M.D.   On: 06/03/2018 06:10    No results found.  Dg Chest Port 1 View  Result Date: 06/03/2018 CLINICAL DATA:  67 y/o  F; shortness of breath and hypoxemia. EXAM: PORTABLE CHEST 1 VIEW COMPARISON:  05/08/2018 CT chest. 04/11/2018 chest radiograph. FINDINGS: Stable cardiac silhouette given projection and technique. Calcific aortic atherosclerosis. Stable medial aspect of right upper lobe consolidation. Stable findings of emphysema and coarse reticulation with basilar predominance compatible with fibrosis. Interval increase in right-sided pleural effusion. Right upper quadrant cholecystectomy clips. No acute osseous abnormality identified. IMPRESSION: Interval increase in right-sided pleural effusion. Stable right upper  lobe consolidation. Stable findings of emphysema, fibrosis, aortic atherosclerosis. Electronically Signed   By: Kristine Garbe M.D.   On: 06/03/2018 06:10      Assessment and Plan: Patient Active Problem List   Diagnosis Date Noted  . HCAP (healthcare-associated pneumonia) 06/03/2018  . Chronic congestive heart failure with left ventricular diastolic dysfunction ([place]) 04/12/2018  . Obstructive chronic bronchitis without exacerbation ([place]) 02/17/2018  . Major depression, chronic 02/17/2018  . On supplemental oxygen therapy 01/21/2018  . Pollen allergies 01/16/2018  . CHF (congestive heart failure) ([place]) 05/28/2017  . Syncope 02/06/2017  . Pancreatic mass 02/06/2017  . Iron deficiency anemia 05/01/2016  . Anemia in chronic renal disease 04/26/2016  . Insomnia 06/12/2015  . CKD (chronic kidney disease) stage 2, GFR 60-89 ml/min 06/12/2015  . Hypercalcemia 06/12/2015  . Hypothyroidism 06/12/2015  . Pulmonary heart disease ([place]) 06/12/2015  . Hyperlipidemia 06/12/2015  . Hypertension 06/12/2015  . Atherosclerotic heart disease 06/12/2015  . Solitary pulmonary nodule 06/12/2015    1. Chronic respiratory failure with hypoxia (HCC) Continue to use rescue inhaler as prescribed.  Also gave patient a sample of Symbicort and if she feels like this helps I sent a prescription to the pharmacy.  She is unsure if she will be able to afford the medication but says that she will see how it does. - budesonide-formoterol (SYMBICORT) 160-4.5 MCG/ACT inhaler; Inhale 2 puffs into the lungs 2 (two) times daily.  Dispense: 1 Inhaler; Refill: 2  2. Pneumonia due to infectious organism, unspecified laterality, unspecified part of lung Her pneumonia appears to be resolved.  She no longer has cough, fever, or overt shortness of breath.  3. Oxygen dependent Continue use oxygen at 2 L/min continuously 24 hours a day.  4. Essential hypertension Patient systolic blood pressure slightly  elevated at  today's visit.  We will continue to monitor in the future.  General Counseling: I have discussed the findings of the evaluation and examination with Melissa Knox.  I have also discussed any further diagnostic evaluation thatmay be needed or ordered today. Melissa Knox verbalizes understanding of the findings of todays visit. We also reviewed her medications today and discussed drug interactions and side effects including but not limited excessive drowsiness and altered mental states. We also discussed that there is always a risk not just to her but also people around her. she has been encouraged to call the office with any questions or concerns that should arise related to todays visit.    Time spent: 25 This patient was seen by Orson Gear AGNP-C in Collaboration with Dr. Devona Konig as a part of collaborative care agreement.   I have personally obtained a history, examined the patient, evaluated laboratory and imaging results, formulated the assessment and plan and placed orders.    Allyne Gee, MD [facility] Pulmonary and Critical Care Sleep medicine

## 2018-07-03 DIAGNOSIS — J438 Other emphysema: Secondary | ICD-10-CM | POA: Diagnosis not present

## 2018-07-03 DIAGNOSIS — N189 Chronic kidney disease, unspecified: Secondary | ICD-10-CM | POA: Diagnosis not present

## 2018-07-03 DIAGNOSIS — R0602 Shortness of breath: Secondary | ICD-10-CM | POA: Diagnosis not present

## 2018-07-03 DIAGNOSIS — J9611 Chronic respiratory failure with hypoxia: Secondary | ICD-10-CM | POA: Diagnosis not present

## 2018-07-03 DIAGNOSIS — J84111 Idiopathic interstitial pneumonia, not otherwise specified: Secondary | ICD-10-CM | POA: Diagnosis not present

## 2018-07-03 DIAGNOSIS — I129 Hypertensive chronic kidney disease with stage 1 through stage 4 chronic kidney disease, or unspecified chronic kidney disease: Secondary | ICD-10-CM | POA: Diagnosis not present

## 2018-07-03 DIAGNOSIS — Z87891 Personal history of nicotine dependence: Secondary | ICD-10-CM | POA: Diagnosis not present

## 2018-07-03 DIAGNOSIS — J849 Interstitial pulmonary disease, unspecified: Secondary | ICD-10-CM | POA: Diagnosis not present

## 2018-07-03 DIAGNOSIS — R05 Cough: Secondary | ICD-10-CM | POA: Diagnosis not present

## 2018-07-03 DIAGNOSIS — R5381 Other malaise: Secondary | ICD-10-CM | POA: Diagnosis not present

## 2018-07-06 DIAGNOSIS — I10 Essential (primary) hypertension: Secondary | ICD-10-CM | POA: Diagnosis not present

## 2018-07-06 DIAGNOSIS — N183 Chronic kidney disease, stage 3 (moderate): Secondary | ICD-10-CM | POA: Diagnosis not present

## 2018-07-06 DIAGNOSIS — D631 Anemia in chronic kidney disease: Secondary | ICD-10-CM | POA: Diagnosis not present

## 2018-07-06 DIAGNOSIS — R809 Proteinuria, unspecified: Secondary | ICD-10-CM | POA: Diagnosis not present

## 2018-07-13 ENCOUNTER — Telehealth: Payer: Self-pay

## 2018-07-13 NOTE — Telephone Encounter (Signed)
Pt cmn for oxygen is placed if completed and in our rx folder if needed.

## 2018-07-17 ENCOUNTER — Ambulatory Visit (INDEPENDENT_AMBULATORY_CARE_PROVIDER_SITE_OTHER): Payer: Medicare Other | Admitting: Nurse Practitioner

## 2018-07-17 ENCOUNTER — Encounter: Payer: Self-pay | Admitting: Nurse Practitioner

## 2018-07-17 VITALS — BP 149/55 | HR 97 | Resp 16 | Ht 60.0 in | Wt 145.0 lb

## 2018-07-17 DIAGNOSIS — I10 Essential (primary) hypertension: Secondary | ICD-10-CM

## 2018-07-17 DIAGNOSIS — Z0001 Encounter for general adult medical examination with abnormal findings: Secondary | ICD-10-CM | POA: Diagnosis not present

## 2018-07-17 DIAGNOSIS — Z9981 Dependence on supplemental oxygen: Secondary | ICD-10-CM

## 2018-07-17 DIAGNOSIS — J449 Chronic obstructive pulmonary disease, unspecified: Secondary | ICD-10-CM

## 2018-07-17 DIAGNOSIS — Z1239 Encounter for other screening for malignant neoplasm of breast: Secondary | ICD-10-CM | POA: Diagnosis not present

## 2018-07-17 MED ORDER — CARVEDILOL 6.25 MG PO TABS: 6.2500 mg | ORAL_TABLET | Freq: Two times a day (BID) | ORAL | 5 refills | Status: DC

## 2018-07-17 NOTE — Progress Notes (Signed)
[facility] [place], [place] [address]  Internal MEDICINE  Office Visit Note  Patient Name: Melissa Knox  [id number]  [id number]  Date of Service: 07/19/2018   Pt is here for routine health maintenance examination  Chief Complaint  Patient presents with  . Annual Exam  . Hyperlipidemia  . COPD  . Congestive Heart Failure  . Hypertension  . Quality Metric Gaps    colonoscopy     The patient is here for routine health maintenance exam. She continues to have shortness of breath, especially with exertion. She is using nasal cannula oxygen at all times. Turns it up to 4lpm when active. Symptoms are slowly, but steadily improving. She continues to see Dr. Devona Konig, pulmonologist, as well as second pulmonologist at [facility].  The patient is overdue for colonoscopy. She has had to postpone this for now, as she had two recent hospitalizations for pneumonia and COPD exacerbations and does not feel strong enough at this time to go through this procedure.     Current Medication: Outpatient Encounter Medications as of 07/17/2018  Medication Sig  . albuterol (PROVENTIL) (2.5 MG/3ML) 0.083% nebulizer solution Take 2.5 mg by nebulization every 6 (six) hours as needed for wheezing or shortness of breath.  Marland Kitchen amLODipine (NORVASC) 10 MG tablet Take 1 tablet (10 mg total) by mouth daily.  Marland Kitchen aspirin EC 81 MG tablet Take 81 mg by mouth daily.   Marland Kitchen atorvastatin (LIPITOR) 10 MG tablet Take 1 tablet (10 mg total) by mouth daily at 6 PM.  . budesonide-formoterol (SYMBICORT) 160-4.5 MCG/ACT inhaler Inhale 2 puffs into the lungs 2 (two) times daily.  . carvedilol (COREG) 6.25 MG tablet Take 1 tablet (6.25 mg total) by mouth 2 (two) times daily.  Marland Kitchen docusate sodium (COLACE) 100 MG capsule Take 1 capsule (100 mg total) by mouth 2 (two) times daily as needed for mild constipation.  . furosemide (LASIX) 20 MG tablet Take 1 tablet (20 mg total) by mouth daily.  Marland Kitchen guaiFENesin  (ROBITUSSIN) 100 MG/5ML SOLN Take 5 mLs (100 mg total) by mouth every 4 (four) hours as needed for cough or to loosen phlegm.  Marland Kitchen levothyroxine (SYNTHROID, LEVOTHROID) 50 MCG tablet Take 1 tablet (50 mcg total) by mouth daily before breakfast.  . loratadine (CLARITIN) 10 MG tablet Take 1 tablet (10 mg total) by mouth daily. (Patient taking differently: Take 10 mg by mouth daily as needed. )  . meclizine (ANTIVERT) 12.5 MG tablet Take 12.5 mg by mouth 3 (three) times daily as needed for dizziness.   . mirtazapine (REMERON) 15 MG tablet Take 0.5 tablets (7.5 mg total) by mouth at bedtime.  . OXYGEN Inhale into the lungs. 2 litre  . pantoprazole (PROTONIX) 40 MG tablet Take 1 tablet (40 mg total) by mouth 2 (two) times daily.  . sucralfate (CARAFATE) 1 g tablet Take 1 tablet (1 g total) by mouth 4 (four) times daily -  with meals and at bedtime.  . [DISCONTINUED] carvedilol (COREG) 6.25 MG tablet Take 6.25 mg by mouth 2 (two) times daily.  . cefpodoxime (VANTIN) 200 MG tablet Take 1 tablet (200 mg total) by mouth 2 (two) times daily. (Patient not taking: Reported on 07/17/2018)   No facility-administered encounter medications on file as of 07/17/2018.     Surgical History: Past Surgical History:  Procedure Laterality Date  . GALLBLADDER SURGERY  2012    Medical History: Past Medical History:  Diagnosis Date  . Atherosclerotic heart disease 06/12/2015  .  CHF (congestive heart failure) ([place])   . Chronic congestive heart failure with left ventricular diastolic dysfunction ([place]) 04/12/2018  . CKD (chronic kidney disease) stage 2, GFR 60-89 ml/min 06/12/2015  . COPD (chronic obstructive pulmonary disease) ([place])   . Hypercalcemia 06/12/2015  . Hyperlipidemia 06/12/2015  . Hypertension 06/12/2015  . Hypothyroidism 06/12/2015  . Insomnia 06/12/2015  . Interstitial pneumonia ([place]) 04/12/2018  . 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 Shortness of breath 06/12/2015  . Solitary pulmonary  nodule 06/12/2015    Family History: Family History  Problem Relation Age of Onset  . Cancer Mother   . Hypertension Father   . Diabetes Father   . Cancer Father       Review of Systems  Constitutional: Positive for fatigue. Negative for chills and unexpected weight change.  HENT: Negative for congestion, postnasal drip, rhinorrhea, sneezing and sore throat.   Respiratory: Positive for cough, shortness of breath and wheezing. Negative for chest tightness.        Using oxygen via nasal cannula at all times.   Cardiovascular: Negative for chest pain and palpitations.  Gastrointestinal: Negative for abdominal pain, constipation, diarrhea, nausea and vomiting.  Endocrine: Negative for cold intolerance, heat intolerance, polydipsia, polyphagia and polyuria.  Genitourinary: Negative for dysuria and frequency.  Musculoskeletal: Negative for arthralgias, back pain, joint swelling and neck pain.  Skin: Negative for rash.  Allergic/Immunologic: Positive for environmental allergies.  Neurological: Positive for weakness. Negative for dizziness, tremors and numbness.  Hematological: Negative for adenopathy. Does not bruise/bleed easily.  Psychiatric/Behavioral: Negative for behavioral problems (Depression), sleep disturbance and suicidal ideas. The patient is not nervous/anxious.      Today's Vitals   07/17/18 1130  BP: (!) 149/55  Pulse: 97  Resp: 16  SpO2: 93%  Weight: 145 lb (65.8 kg)  Height: 5' (1.524 m)    Physical Exam  Constitutional: She is oriented to person, place, and time. She appears well-developed and well-nourished. No distress.  HENT:  Head: Normocephalic and atraumatic.  Nose: Nose normal.  Mouth/Throat: No oropharyngeal exudate.  Eyes: Pupils are equal, round, and reactive to light. Conjunctivae and EOM are normal.  Neck: Normal range of motion. Neck supple. No JVD present. No tracheal deviation present. No thyromegaly present.  Cardiovascular: Normal rate,  regular rhythm and intact distal pulses. Exam reveals no gallop and no friction rub.  Murmur heard. Pulmonary/Chest: Effort normal. No respiratory distress. She has no wheezes. She has no rales. She exhibits no tenderness.  Breath sounds are diminished throughout the lung fields. She has congested, non-productive cough. She is using nasal cannula oxygen at all times .  Abdominal: Soft. Bowel sounds are normal. There is no tenderness.  Musculoskeletal: Normal range of motion.  Lymphadenopathy:    She has no cervical adenopathy.  Neurological: She is alert and oriented to person, place, and time. No cranial nerve deficit.  Skin: Skin is warm and dry. Capillary refill takes 2 to 3 seconds. She is not diaphoretic.  Psychiatric: She has a normal mood and affect. Her behavior is normal. Judgment and thought content normal.  Nursing note and vitals reviewed.  Depression screen [facility] 2/9 06/22/2018 05/17/2018 04/18/2018 02/17/2018 02/06/2018  Decreased Interest 0 0 0 0 1  Down, Depressed, Hopeless 0 0 0 0 1  PHQ - 2 Score 0 0 0 0 2    Functional Status Survey: Is the patient deaf or have difficulty hearing?: No Does the patient have difficulty seeing, even when wearing glasses/contacts?: No Does the  patient have difficulty concentrating, remembering, or making decisions?: No Does the patient have difficulty walking or climbing stairs?: No Does the patient have difficulty dressing or bathing?: No Does the patient have difficulty doing errands alone such as visiting a doctor's office or shopping?: No  MMSE - Mini Mental State Exam 07/17/2018  Orientation to time 5  Orientation to Place 5  Registration 3  Attention/ Calculation 5  Recall 3  Language- name 2 objects 2  Language- repeat 1  Language- follow 3 step command 3  Language- read & follow direction 1  Write a sentence 1  Copy design 0  Total score 29    Fall Risk  07/17/2018 07/17/2018 06/22/2018 05/17/2018 04/18/2018  Falls in the  past year? 0 0 No Yes Yes  Comment - - - - -  Number falls in past yr: 0 0 - 1 1  Comment - - - - -  Injury with Fall? 0 0 - No Yes  Risk for fall due to : - - - - -  Risk for fall due to: Comment - - - - -  Follow up - - - Falls prevention discussed -     LABS: Recent Results (from the past 2160 hour(s))  Pulmonary Function Test     Status: None   Collection Time: 05/10/18 10:30 AM  Result Value Ref Range   FEV1     FVC     FEV1/FVC     TLC     DLCO    Comprehensive metabolic panel     Status: Abnormal   Collection Time: 06/03/18  4:05 AM  Result Value Ref Range   Sodium 140 135 - 145 mmol/L   Potassium 3.7 3.5 - 5.1 mmol/L   Chloride 106 98 - 111 mmol/L   CO2 21 (L) 22 - 32 mmol/L   Glucose, Bld 178 (H) 70 - 99 mg/dL   BUN 25 (H) 8 - 23 mg/dL   Creatinine, Ser 1.64 (H) 0.44 - 1.00 mg/dL   Calcium 8.7 (L) 8.9 - 10.3 mg/dL   Total Protein 7.9 6.5 - 8.1 g/dL   Albumin 3.1 (L) 3.5 - 5.0 g/dL   AST 18 15 - 41 U/L   ALT 9 0 - 44 U/L   Alkaline Phosphatase 64 38 - 126 U/L   Total Bilirubin 0.4 0.3 - 1.2 mg/dL   GFR calc non Af Amer 31 (L) >60 mL/min   GFR calc Af Amer 36 (L) >60 mL/min    Comment: (NOTE) The eGFR has been calculated using the CKD EPI equation. This calculation has not been validated in all clinical situations. eGFR's persistently <60 mL/min signify possible Chronic Kidney Disease.    Anion gap 13 5 - 15    Comment: Performed at [facility], [address] [place], [place] [address]  Lipase, blood     Status: None   Collection Time: 06/03/18  4:05 AM  Result Value Ref Range   Lipase 31 11 - 51 U/L    Comment: Performed at [facility], [address] [place], [place] [address]  Brain natriuretic peptide     Status: Abnormal   Collection Time: 06/03/18  4:05 AM  Result Value Ref Range   B Natriuretic Peptide 369.0 (H) 0.0 - 100.0 pg/mL    Comment: Performed at [facility], [address] [place], [place] [address]   Troponin I     Status: Abnormal   Collection Time: 06/03/18  4:05 AM  Result Value Ref Range   Troponin I 0.06 (HH) <0.03 ng/mL    Comment: CRITICAL RESULT CALLED TO, READ BACK BY AND VERIFIED WITH NOEL WEBSTER ON 06/03/18 AT [facility] Performed at [facility], [address] [place], [place] [address]   CBC with Differential     Status: Abnormal   Collection Time: 06/03/18  4:05 AM  Result Value Ref Range   WBC 13.3 (H) 3.6 - 11.0 K/uL   RBC 2.66 (L) 3.80 - 5.20 MIL/uL   Hemoglobin 6.9 (L) 12.0 - 16.0 g/dL   HCT 23.3 (L) 35.0 - 47.0 %   MCV 87.5 80.0 - 100.0 fL   MCH 25.9 (L) 26.0 - 34.0 pg   MCHC 29.6 (L) 32.0 - 36.0 g/dL   RDW 19.2 (H) 11.5 - 14.5 %   Platelets 710 (H) 150 - 440 K/uL   Neutrophils Relative % 69 %   Neutro Abs 9.1 (H) 1.4 - 6.5 K/uL   Lymphocytes Relative 20 %   Lymphs Abs 2.7 1.0 - 3.6 K/uL   Monocytes Relative 6 %   Monocytes Absolute 0.8 0.2 - 0.9 K/uL   Eosinophils Relative 4 %   Eosinophils Absolute 0.5 0 - 0.7 K/uL   Basophils Relative 1 %   Basophils Absolute 0.1 0 - 0.1 K/uL    Comment: Performed at [facility], [address] [place], [place] [address]  Blood gas, venous     Status: Abnormal   Collection Time: 06/03/18  4:12 AM  Result Value Ref Range   FIO2 100.00    Delivery systems NON-REBREATHER OXYGEN MASK    pH, Ven 7.36 7.250 - 7.430   pCO2, Ven 41 (L) 44.0 - 60.0 mmHg   Bicarbonate 23.2 20.0 - 28.0 mmol/L   Acid-base deficit 2.1 (H) 0.0 - 2.0 mmol/L   Patient temperature 37.0    Collection site VENOUS    Sample type VENOUS     Comment: Performed at [facility], [address] [place], [place] [address]  Prepare RBC     Status: None   Collection Time: 06/03/18  9:59 AM  Result Value Ref Range   Order Confirmation      ORDER PROCESSED BY BLOOD BANK Performed at [facility], [address] [place], [place] [address]   Glucose, capillary     Status: None   Collection Time: 06/03/18  10:02 AM  Result Value Ref Range   Glucose-Capillary 98 70 - 99 mg/dL  MRSA PCR Screening     Status: None   Collection Time: 06/03/18 10:07 AM  Result Value Ref Range   MRSA by PCR NEGATIVE NEGATIVE    Comment:        The GeneXpert MRSA Assay (FDA approved for NASAL specimens only), is one component of a comprehensive MRSA colonization surveillance program. It is not intended to diagnose MRSA infection nor to guide or monitor treatment for MRSA infections. Performed at [facility], [address] [place],  [address]   Type and screen [place]     Status: None   Collection Time: 06/03/18 10:29 AM  Result Value Ref Range   ABO/RH(D) A POS    Antibody Screen NEG    Sample Expiration 06/06/2018    Unit Number [id number]    Blood Component Type RED CELLS,LR    Unit division 00    Status of Unit ISSUED,FINAL    Transfusion Status OK TO TRANSFUSE    Crossmatch Result  Compatible Performed at [facility], [address] [place], [place] [address]   BPAM RBC     Status: None   Collection Time: 06/03/18 10:29 AM  Result Value Ref Range   ISSUE DATE / TIME [id number]    Blood Product Unit Number [id number]    PRODUCT CODE [id number]    Unit Type and Rh 6200    Blood Product Expiration Date [id number]   TSH     Status: None   Collection Time: 06/03/18 10:33 AM  Result Value Ref Range   TSH 0.527 0.350 - 4.500 uIU/mL    Comment: Performed by a 3rd Generation assay with a functional sensitivity of <=0.01 uIU/mL. Performed at [facility], [address] [place], [place] [address]   Hemoglobin A1c     Status: Abnormal   Collection Time: 06/03/18 10:33 AM  Result Value Ref Range   Hgb A1c MFr Bld 5.9 (H) 4.8 - 5.6 %    Comment: (NOTE)         Prediabetes: 5.7 - 6.4         Diabetes: >6.4         Glycemic control for adults with diabetes: <7.0    Mean Plasma Glucose 123 mg/dL    Comment:  (NOTE) Performed At: [facility] [place], [place] [id number] Rush Farmer MD [id number]   Troponin I     Status: Abnormal   Collection Time: 06/03/18 10:33 AM  Result Value Ref Range   Troponin I 0.05 (HH) <0.03 ng/mL    Comment: CRITICAL VALUE NOTED. VALUE IS CONSISTENT WITH PREVIOUSLY REPORTED/CALLED VALUE SNJ Performed at [facility], [address] [place], [place] [address]   HIV antibody (Routine Screening)     Status: None   Collection Time: 06/03/18 10:33 AM  Result Value Ref Range   HIV Screen 4th Generation wRfx Non Reactive Non Reactive    Comment: (NOTE) Performed At: [facility] [place], [place] [id number] Rush Farmer MD OA:[id number]   Procalcitonin - Baseline     Status: None   Collection Time: 06/03/18 10:33 AM  Result Value Ref Range   Procalcitonin 0.19 ng/mL    Comment:        Interpretation: PCT (Procalcitonin) <= 0.5 ng/mL: Systemic infection (sepsis) is not likely. Local bacterial infection is possible. (NOTE)       Sepsis PCT Algorithm           Lower Respiratory Tract                                      Infection PCT Algorithm    ----------------------------     ----------------------------         PCT < 0.25 ng/mL                PCT < 0.10 ng/mL         Strongly encourage             Strongly discourage   discontinuation of antibiotics    initiation of antibiotics    ----------------------------     -----------------------------       PCT 0.25 - 0.50 ng/mL            PCT 0.10 - 0.25 ng/mL               OR       >80% decrease  in PCT            Discourage initiation of                                            antibiotics      Encourage discontinuation           of antibiotics    ----------------------------     -----------------------------         PCT >= 0.50 ng/mL              PCT 0.26 - 0.50 ng/mL               AND        <80% decrease in PCT             Encourage  initiation of                                             antibiotics       Encourage continuation           of antibiotics    ----------------------------     -----------------------------        PCT >= 0.50 ng/mL                  PCT > 0.50 ng/mL               AND         increase in PCT                  Strongly encourage                                      initiation of antibiotics    Strongly encourage escalation           of antibiotics                                     -----------------------------                                           PCT <= 0.25 ng/mL                                                 OR                                        > 80% decrease in PCT                                     Discontinue / Do not initiate  antibiotics Performed at [facility], [address] [place], [place] [address]   Magnesium     Status: None   Collection Time: 06/03/18 10:33 AM  Result Value Ref Range   Magnesium 1.7 1.7 - 2.4 mg/dL    Comment: Performed at [facility], [address] [place], [place] [address]  Urinalysis, Routine w reflex microscopic     Status: Abnormal   Collection Time: 06/03/18 12:00 PM  Result Value Ref Range   Color, Urine COLORLESS (A) YELLOW   APPearance CLEAR (A) CLEAR   Specific Gravity, Urine 1.005 1.005 - 1.030   pH 6.0 5.0 - 8.0   Glucose, UA NEGATIVE NEGATIVE mg/dL   Hgb urine dipstick NEGATIVE NEGATIVE   Bilirubin Urine NEGATIVE NEGATIVE   Ketones, ur NEGATIVE NEGATIVE mg/dL   Protein, ur NEGATIVE NEGATIVE mg/dL   Nitrite NEGATIVE NEGATIVE   Leukocytes, UA NEGATIVE NEGATIVE    Comment: Performed at [facility], [address] [place], [place] [address]  Strep pneumoniae urinary antigen     Status: None   Collection Time: 06/03/18 12:00 PM  Result Value Ref Range   Strep Pneumo Urinary Antigen NEGATIVE NEGATIVE    Comment:        Infection due to S.  pneumoniae cannot be absolutely ruled out since the antigen present may be below the detection limit of the test. Performed at [facility], [address] [place], [place] [address]   Hemoglobin     Status: Abnormal   Collection Time: 06/03/18  4:59 PM  Result Value Ref Range   Hemoglobin 7.9 (L) 12.0 - 16.0 g/dL    Comment: Performed at [facility], [address] [place], [place] [address]  Troponin I     Status: Abnormal   Collection Time: 06/03/18  4:59 PM  Result Value Ref Range   Troponin I 0.03 (HH) <0.03 ng/mL    Comment: CRITICAL VALUE NOTED. VALUE IS CONSISTENT WITH PREVIOUSLY REPORTED/CALLED VALUE SNJ Performed at [facility], [address] [place], [place] [address]   Troponin I     Status: None   Collection Time: 06/03/18 10:05 PM  Result Value Ref Range   Troponin I <0.03 <0.03 ng/mL    Comment: Performed at [facility], [address] [place], [place] [address]  Procalcitonin     Status: None   Collection Time: 06/04/18  6:11 AM  Result Value Ref Range   Procalcitonin 0.18 ng/mL    Comment:        Interpretation: PCT (Procalcitonin) <= 0.5 ng/mL: Systemic infection (sepsis) is not likely. Local bacterial infection is possible. (NOTE)       Sepsis PCT Algorithm           Lower Respiratory Tract                                      Infection PCT Algorithm    ----------------------------     ----------------------------         PCT < 0.25 ng/mL                PCT < 0.10 ng/mL         Strongly encourage             Strongly discourage   discontinuation of antibiotics    initiation of antibiotics    ----------------------------     -----------------------------  PCT 0.25 - 0.50 ng/mL            PCT 0.10 - 0.25 ng/mL               OR       >80% decrease in PCT            Discourage initiation of                                            antibiotics      Encourage discontinuation           of antibiotics     ----------------------------     -----------------------------         PCT >= 0.50 ng/mL              PCT 0.26 - 0.50 ng/mL               AND        <80% decrease in PCT             Encourage initiation of                                             antibiotics       Encourage continuation           of antibiotics    ----------------------------     -----------------------------        PCT >= 0.50 ng/mL                  PCT > 0.50 ng/mL               AND         increase in PCT                  Strongly encourage                                      initiation of antibiotics    Strongly encourage escalation           of antibiotics                                     -----------------------------                                           PCT <= 0.25 ng/mL                                                 OR                                        > 80% decrease in PCT  Discontinue / Do not initiate                                             antibiotics Performed at [facility], [address] [place], [place] [address]   CBC     Status: Abnormal   Collection Time: 06/04/18  6:11 AM  Result Value Ref Range   WBC 6.0 3.6 - 11.0 K/uL   RBC 2.81 (L) 3.80 - 5.20 MIL/uL   Hemoglobin 7.7 (L) 12.0 - 16.0 g/dL   HCT 23.6 (L) 35.0 - 47.0 %   MCV 84.2 80.0 - 100.0 fL   MCH 27.4 26.0 - 34.0 pg   MCHC 32.5 32.0 - 36.0 g/dL   RDW 18.2 (H) 11.5 - 14.5 %   Platelets 558 (H) 150 - 440 K/uL    Comment: Performed at [facility] [address] [place], [place] [address]  Basic metabolic panel     Status: Abnormal   Collection Time: 06/04/18  6:11 AM  Result Value Ref Range   Sodium 139 135 - 145 mmol/L   Potassium 3.3 (L) 3.5 - 5.1 mmol/L   Chloride 105 98 - 111 mmol/L   CO2 25 22 - 32 mmol/L   Glucose, Bld 172 (H) 70 - 99 mg/dL   BUN 23 8 - 23 mg/dL   Creatinine, Ser 1.26 (H) 0.44 - 1.00 mg/dL   Calcium 8.5 (L) 8.9 - 10.3 mg/dL    GFR calc non Af Amer 43 (L) >60 mL/min   GFR calc Af Amer 50 (L) >60 mL/min    Comment: (NOTE) The eGFR has been calculated using the CKD EPI equation. This calculation has not been validated in all clinical situations. eGFR's persistently <60 mL/min signify possible Chronic Kidney Disease.    Anion gap 9 5 - 15    Comment: Performed at [facility], [address] [place],  [address]  Procalcitonin     Status: None   Collection Time: 06/05/18  3:55 AM  Result Value Ref Range   Procalcitonin 0.13 ng/mL    Comment:        Interpretation: PCT (Procalcitonin) <= 0.5 ng/mL: Systemic infection (sepsis) is not likely. Local bacterial infection is possible. (NOTE)       Sepsis PCT Algorithm           Lower Respiratory Tract                                      Infection PCT Algorithm    ----------------------------     ----------------------------         PCT < 0.25 ng/mL                PCT < 0.10 ng/mL         Strongly encourage             Strongly discourage   discontinuation of antibiotics    initiation of antibiotics    ----------------------------     -----------------------------       PCT 0.25 - 0.50 ng/mL            PCT 0.10 - 0.25 ng/mL               OR       >  80% decrease in PCT            Discourage initiation of                                            antibiotics      Encourage discontinuation           of antibiotics    ----------------------------     -----------------------------         PCT >= 0.50 ng/mL              PCT 0.26 - 0.50 ng/mL               AND        <80% decrease in PCT             Encourage initiation of                                             antibiotics       Encourage continuation           of antibiotics    ----------------------------     -----------------------------        PCT >= 0.50 ng/mL                  PCT > 0.50 ng/mL               AND         increase in PCT                  Strongly encourage                                       initiation of antibiotics    Strongly encourage escalation           of antibiotics                                     -----------------------------                                           PCT <= 0.25 ng/mL                                                 OR                                        > 80% decrease in PCT                                     Discontinue / Do not initiate  antibiotics Performed at [facility], [address] [place], [place] [address]   Creatinine, serum     Status: Abnormal   Collection Time: 06/05/18  3:55 AM  Result Value Ref Range   Creatinine, Ser 1.48 (H) 0.44 - 1.00 mg/dL   GFR calc non Af Amer 35 (L) >60 mL/min   GFR calc Af Amer 41 (L) >60 mL/min    Comment: (NOTE) The eGFR has been calculated using the CKD EPI equation. This calculation has not been validated in all clinical situations. eGFR's persistently <60 mL/min signify possible Chronic Kidney Disease. Performed at [facility], [address] [place], [place] [address]   Comprehensive metabolic panel     Status: Abnormal   Collection Time: 06/05/18  9:19 AM  Result Value Ref Range   Sodium 143 135 - 145 mmol/L   Potassium 3.6 3.5 - 5.1 mmol/L   Chloride 112 (H) 98 - 111 mmol/L   CO2 23 22 - 32 mmol/L   Glucose, Bld 201 (H) 70 - 99 mg/dL   BUN 35 (H) 8 - 23 mg/dL   Creatinine, Ser 1.45 (H) 0.44 - 1.00 mg/dL   Calcium 8.9 8.9 - 10.3 mg/dL   Total Protein 7.3 6.5 - 8.1 g/dL   Albumin 2.7 (L) 3.5 - 5.0 g/dL   AST 19 15 - 41 U/L   ALT 14 0 - 44 U/L   Alkaline Phosphatase 50 38 - 126 U/L   Total Bilirubin 0.5 0.3 - 1.2 mg/dL   GFR calc non Af Amer 36 (L) >60 mL/min   GFR calc Af Amer 42 (L) >60 mL/min    Comment: (NOTE) The eGFR has been calculated using the CKD EPI equation. This calculation has not been validated in all clinical situations. eGFR's persistently <60 mL/min signify possible  Chronic Kidney Disease.    Anion gap 8 5 - 15    Comment: Performed at [facility], [address] [place], [place] [address]  CBC with Differential/Platelet     Status: Abnormal   Collection Time: 06/05/18  9:19 AM  Result Value Ref Range   WBC 13.3 (H) 3.6 - 11.0 K/uL   RBC 2.96 (L) 3.80 - 5.20 MIL/uL   Hemoglobin 7.9 (L) 12.0 - 16.0 g/dL   HCT 25.4 (L) 35.0 - 47.0 %   MCV 85.8 80.0 - 100.0 fL   MCH 26.8 26.0 - 34.0 pg   MCHC 31.2 (L) 32.0 - 36.0 g/dL   RDW 19.2 (H) 11.5 - 14.5 %   Platelets 625 (H) 150 - 440 K/uL   Neutrophils Relative % 93 %   Neutro Abs 12.3 (H) 1.4 - 6.5 K/uL   Lymphocytes Relative 5 %   Lymphs Abs 0.7 (L) 1.0 - 3.6 K/uL   Monocytes Relative 2 %   Monocytes Absolute 0.3 0.2 - 0.9 K/uL   Eosinophils Relative 0 %   Eosinophils Absolute 0.0 0 - 0.7 K/uL   Basophils Relative 0 %   Basophils Absolute 0.0 0 - 0.1 K/uL    Comment: Performed at [facility], [address] [place], [place] [address]  Magnesium     Status: None   Collection Time: 06/05/18  9:19 AM  Result Value Ref Range   Magnesium 1.7 1.7 - 2.4 mg/dL    Comment: Performed at [facility], [address], [place], [place] [address]  Basic metabolic panel     Status: Abnormal   Collection Time: 06/07/18  4:30 AM  Result Value Ref Range   Sodium 141 135 - 145 mmol/L   Potassium 3.3 (L) 3.5 - 5.1 mmol/L   Chloride 110 98 - 111 mmol/L   CO2 24 22 - 32 mmol/L   Glucose, Bld 124 (H) 70 - 99 mg/dL   BUN 38 (H) 8 - 23 mg/dL   Creatinine, Ser 1.35 (H) 0.44 - 1.00 mg/dL   Calcium 8.7 (L) 8.9 - 10.3 mg/dL   GFR calc non Af Amer 40 (L) >60 mL/min   GFR calc Af Amer 46 (L) >60 mL/min    Comment: (NOTE) The eGFR has been calculated using the CKD EPI equation. This calculation has not been validated in all clinical situations. eGFR's persistently <60 mL/min signify possible Chronic Kidney Disease.    Anion gap 7 5 - 15    Comment: Performed at [facility], [address] [place], [place] [address]  Magnesium     Status: None   Collection Time: 06/07/18  4:30 AM  Result Value Ref Range   Magnesium 1.9 1.7 - 2.4 mg/dL    Comment: Performed at [facility], [address] [place], [place] [address]  UA/M w/rflx Culture, Routine     Status: Abnormal   Collection Time: 07/17/18 11:40 AM  Result Value Ref Range   Specific Gravity, UA 1.007 1.005 - 1.030   pH, UA 7.5 5.0 - 7.5   Color, UA Yellow Yellow   Appearance Ur Clear Clear   Leukocytes, UA 2+ (A) Negative   Protein, UA Negative Negative/Trace   Glucose, UA Negative Negative   Ketones, UA Negative Negative   RBC, UA Negative Negative   Bilirubin, UA Negative Negative   Urobilinogen, Ur 0.2 0.2 - 1.0 mg/dL   Nitrite, UA Negative Negative   Microscopic Examination See below:     Comment: Microscopic was indicated and was performed.   Urinalysis Reflex Comment     Comment: This specimen has reflexed to a Urine Culture.  Microscopic Examination     Status: None   Collection Time: 07/17/18 11:40 AM  Result Value Ref Range   WBC, UA 0-5 0 - 5 /hpf   RBC, UA 0-2 0 - 2 /hpf   Epithelial Cells (non renal) 0-10 0 - 10 /hpf   Casts None seen None seen /lpf   Mucus, UA Present Not Estab.   Bacteria, UA None seen None seen/Few  Urine Culture, Reflex     Status: None   Collection Time: 07/17/18 11:40 AM  Result Value Ref Range   Urine Culture, Routine Final report    Organism ID, Bacteria No growth    Assessment/Plan: 1. Encounter for general adult medical examination with abnormal findings Annual health maintenance exam today - UA/M w/rflx Culture, Routine  2. Obstructive chronic bronchitis without exacerbation (HCC) Currently stable. Continue inhalers and respiratory medications as prescribed. Regular visits with pulmonology as schedled.   3. Oxygen dependent Continue to use oxygen at all times as prescribed.   4. Essential hypertension Stable. Continue bp  medications as prescribed.  - carvedilol (COREG) 6.25 MG tablet; Take 1 tablet (6.25 mg total) by mouth 2 (two) times daily.  Dispense: 60 tablet; Refill: 5  5. Screening for breast cancer Screening mammogram ordered.   General Counseling: Ailsa verbalizes understanding of the findings of todays visit and agrees with plan of treatment. I have discussed any further diagnostic evaluation that may be needed or ordered today. We also reviewed her medications today. she has been encouraged  to call the office with any questions or concerns that should arise related to todays visit.    Counseling:  Pt was instructed to be careful to exposures to smoking, to sick people and to extreme weathers. To notify the office as soon as the patient starts to have any symptoms of cough , fatigue and fever. Proper use of MDI is stressed along with compliance.   This patient was seen by Leretha Pol FNP Collaboration with Dr Lavera Guise as a part of collaborative care agreement  Orders Placed This Encounter  Procedures  . Microscopic Examination  . Urine Culture, Reflex  . UA/M w/rflx Culture, Routine    Meds ordered this encounter  Medications  . carvedilol (COREG) 6.25 MG tablet    Sig: Take 1 tablet (6.25 mg total) by mouth 2 (two) times daily.    Dispense:  60 tablet    Refill:  5    Order Specific Question:   Supervising Provider    Answer:   Lavera Guise [[id number]]    Time spent: [place], MD  Internal Medicine

## 2018-07-19 DIAGNOSIS — Z1239 Encounter for other screening for malignant neoplasm of breast: Secondary | ICD-10-CM | POA: Insufficient documentation

## 2018-07-19 LAB — UA/M W/RFLX CULTURE, ROUTINE
BILIRUBIN UA: NEGATIVE
Glucose, UA: NEGATIVE
KETONES UA: NEGATIVE
Nitrite, UA: NEGATIVE
Protein, UA: NEGATIVE
RBC UA: NEGATIVE
Specific Gravity, UA: 1.007 (ref 1.005–1.030)
UUROB: 0.2 mg/dL (ref 0.2–1.0)
pH, UA: 7.5 (ref 5.0–7.5)

## 2018-07-19 LAB — MICROSCOPIC EXAMINATION
Bacteria, UA: NONE SEEN
CASTS: NONE SEEN /LPF

## 2018-07-19 LAB — URINE CULTURE, REFLEX: Organism ID, Bacteria: NO GROWTH

## 2018-07-20 ENCOUNTER — Telehealth: Payer: Self-pay | Admitting: Internal Medicine

## 2018-07-20 NOTE — Telephone Encounter (Signed)
CALLED PATIENT AND INFORMED HER THAT HER MAMMO WAS SCHEDULE FOR 07/25/18 AT10:30 AT [email or web address]

## 2018-07-24 ENCOUNTER — Ambulatory Visit: Payer: Self-pay | Admitting: Internal Medicine

## 2018-08-07 ENCOUNTER — Other Ambulatory Visit: Payer: Self-pay

## 2018-08-07 DIAGNOSIS — F329 Major depressive disorder, single episode, unspecified: Secondary | ICD-10-CM

## 2018-08-07 MED ORDER — MIRTAZAPINE 15 MG PO TABS: 7.5000 mg | ORAL_TABLET | Freq: Every day | ORAL | 3 refills | Status: DC

## 2018-08-10 ENCOUNTER — Encounter: Payer: Self-pay | Admitting: Internal Medicine

## 2018-08-10 ENCOUNTER — Ambulatory Visit (INDEPENDENT_AMBULATORY_CARE_PROVIDER_SITE_OTHER): Payer: Medicare Other | Admitting: Internal Medicine

## 2018-08-10 VITALS — BP 136/56 | HR 90 | Resp 16 | Ht 60.0 in | Wt 143.0 lb

## 2018-08-10 DIAGNOSIS — I509 Heart failure, unspecified: Secondary | ICD-10-CM | POA: Diagnosis not present

## 2018-08-10 DIAGNOSIS — R0602 Shortness of breath: Secondary | ICD-10-CM | POA: Diagnosis not present

## 2018-08-10 DIAGNOSIS — J9611 Chronic respiratory failure with hypoxia: Secondary | ICD-10-CM

## 2018-08-10 DIAGNOSIS — I10 Essential (primary) hypertension: Secondary | ICD-10-CM

## 2018-08-10 DIAGNOSIS — Z9981 Dependence on supplemental oxygen: Secondary | ICD-10-CM | POA: Diagnosis not present

## 2018-08-10 DIAGNOSIS — J449 Chronic obstructive pulmonary disease, unspecified: Secondary | ICD-10-CM | POA: Diagnosis not present

## 2018-08-10 NOTE — Progress Notes (Signed)
[facility] [place], [place] [address]  Pulmonary Sleep Medicine   Office Visit Note  Patient Name: Melissa Knox DOB: [birth year] MRN [id number]  Date of Service: 08/10/2018  Complaints/HPI: Pt is here for follow up on COPD and chronic respiratory failure with hypoxia.  Overall she is doing well.  She denies any hospitalizations or illness.  She reports good results using Symbicort and albuterol.  She remains oxygen dependent at 2 L/min via nasal cannula 24 hours a day.  She denies any hemoptysis, chest pain, palpitations or other symptoms.  ROS  General: (-) fever, (-) chills, (-) night sweats, (-) weakness Skin: (-) rashes, (-) itching,. Eyes: (-) visual changes, (-) redness, (-) itching. Nose and Sinuses: (-) nasal stuffiness or itchiness, (-) postnasal drip, (-) nosebleeds, (-) sinus trouble. Mouth and Throat: (-) sore throat, (-) hoarseness. Neck: (-) swollen glands, (-) enlarged thyroid, (-) neck pain. Respiratory: + cough, (-) bloody sputum, + shortness of breath, - wheezing. Cardiovascular: - ankle swelling, (-) chest pain. Lymphatic: (-) lymph node enlargement. Neurologic: (-) numbness, (-) tingling. Psychiatric: (-) anxiety, (-) depression   Current Medication: Outpatient Encounter Medications as of 08/10/2018  Medication Sig  . albuterol (PROVENTIL) (2.5 MG/3ML) 0.083% nebulizer solution Take 2.5 mg by nebulization every 6 (six) hours as needed for wheezing or shortness of breath.  Marland Kitchen amLODipine (NORVASC) 10 MG tablet Take 1 tablet (10 mg total) by mouth daily.  Marland Kitchen aspirin EC 81 MG tablet Take 81 mg by mouth daily.   Marland Kitchen atorvastatin (LIPITOR) 10 MG tablet Take 1 tablet (10 mg total) by mouth daily at 6 PM.  . budesonide-formoterol (SYMBICORT) 160-4.5 MCG/ACT inhaler Inhale 2 puffs into the lungs 2 (two) times daily.  . carvedilol (COREG) 6.25 MG tablet Take 1 tablet (6.25 mg total) by mouth 2 (two) times daily.  . cefpodoxime (VANTIN) 200 MG  tablet Take 1 tablet (200 mg total) by mouth 2 (two) times daily.  Marland Kitchen docusate sodium (COLACE) 100 MG capsule Take 1 capsule (100 mg total) by mouth 2 (two) times daily as needed for mild constipation.  . furosemide (LASIX) 20 MG tablet Take 1 tablet (20 mg total) by mouth daily.  Marland Kitchen guaiFENesin (ROBITUSSIN) 100 MG/5ML SOLN Take 5 mLs (100 mg total) by mouth every 4 (four) hours as needed for cough or to loosen phlegm.  Marland Kitchen levothyroxine (SYNTHROID, LEVOTHROID) 50 MCG tablet Take 1 tablet (50 mcg total) by mouth daily before breakfast.  . loratadine (CLARITIN) 10 MG tablet Take 1 tablet (10 mg total) by mouth daily. (Patient taking differently: Take 10 mg by mouth daily as needed. )  . meclizine (ANTIVERT) 12.5 MG tablet Take 12.5 mg by mouth 3 (three) times daily as needed for dizziness.   . mirtazapine (REMERON) 15 MG tablet Take 0.5 tablets (7.5 mg total) by mouth at bedtime.  . OXYGEN Inhale into the lungs. 2 litre  . pantoprazole (PROTONIX) 40 MG tablet Take 1 tablet (40 mg total) by mouth 2 (two) times daily.  . sucralfate (CARAFATE) 1 g tablet Take 1 tablet (1 g total) by mouth 4 (four) times daily -  with meals and at bedtime.   No facility-administered encounter medications on file as of 08/10/2018.     Surgical History: Past Surgical History:  Procedure Laterality Date  . GALLBLADDER SURGERY  2012    Medical History: Past Medical History:  Diagnosis Date  . Atherosclerotic heart disease 06/12/2015  . CHF (congestive heart failure) ([place])   . Chronic  congestive heart failure with left ventricular diastolic dysfunction ([place]) 04/12/2018  . CKD (chronic kidney disease) stage 2, GFR 60-89 ml/min 06/12/2015  . COPD (chronic obstructive pulmonary disease) ([place])   . Hypercalcemia 06/12/2015  . Hyperlipidemia 06/12/2015  . Hypertension 06/12/2015  . Hypothyroidism 06/12/2015  . Insomnia 06/12/2015  . Interstitial pneumonia ([place]) 04/12/2018  . 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  Shortness of breath 06/12/2015  . Solitary pulmonary nodule 06/12/2015    Family History: Family History  Problem Relation Age of Onset  . Cancer Mother   . Hypertension Father   . Diabetes Father   . Cancer Father     Social History: Social History   Socioeconomic History  . Marital status: Married    Spouse name: Daryl  . Number of children: 3  . Years of education: 66  . Highest education level: 12th grade  Occupational History  . Not on file  Social Needs  . Financial resource strain: Not hard at all  . Food insecurity:    Worry: Never true    Inability: Never true  . Transportation needs:    Medical: No    Non-medical: No  Tobacco Use  . Smoking status: Former Smoker    Last attempt to quit: 2013    Years since quitting: 6.9  . Smokeless tobacco: Never Used  Substance and Sexual Activity  . Alcohol use: No    Alcohol/week: 0.0 standard drinks  . Drug use: No  . Sexual activity: Yes    Birth control/protection: None  Lifestyle  . Physical activity:    Days per week: 2 days    Minutes per session: 30 min  . Stress: Not at all  Relationships  . Social connections:    Talks on phone: Twice a week    Gets together: Once a week    Attends religious service: 1 to 4 times per year    Active member of club or organization: No    Attends meetings of clubs or organizations: Never    Relationship status: Married  . Intimate partner violence:    Fear of current or ex partner: No    Emotionally abused: No    Physically abused: No    Forced sexual activity: No  Other Topics Concern  . Not on file  Social History Narrative  . Not on file    Vital Signs: Blood pressure (!) 136/56, pulse 90, resp. rate 16, height 5' (1.524 m), weight 143 lb (64.9 kg), SpO2 93 %.  Examination: General Appearance: The patient is well-developed, well-nourished, and in no distress. Skin: Gross inspection of skin unremarkable. Head: normocephalic, no gross deformities. Eyes: no  gross deformities noted. ENT: ears appear grossly normal no exudates. Neck: Supple. No thyromegaly. No LAD. Respiratory: clear bilateraly. Cardiovascular: Normal S1 and S2 without murmur or rub. Extremities: No cyanosis. pulses are equal. Neurologic: Alert and oriented. No involuntary movements.  LABS: Recent Results (from the past 2160 hour(s))  Comprehensive metabolic panel     Status: Abnormal   Collection Time: 06/03/18  4:05 AM  Result Value Ref Range   Sodium 140 135 - 145 mmol/L   Potassium 3.7 3.5 - 5.1 mmol/L   Chloride 106 98 - 111 mmol/L   CO2 21 (L) 22 - 32 mmol/L   Glucose, Bld 178 (H) 70 - 99 mg/dL   BUN 25 (H) 8 - 23 mg/dL   Creatinine, Ser 1.64 (H) 0.44 - 1.00 mg/dL   Calcium 8.7 (L) 8.9 -  10.3 mg/dL   Total Protein 7.9 6.5 - 8.1 g/dL   Albumin 3.1 (L) 3.5 - 5.0 g/dL   AST 18 15 - 41 U/L   ALT 9 0 - 44 U/L   Alkaline Phosphatase 64 38 - 126 U/L   Total Bilirubin 0.4 0.3 - 1.2 mg/dL   GFR calc non Af Amer 31 (L) >60 mL/min   GFR calc Af Amer 36 (L) >60 mL/min    Comment: (NOTE) The eGFR has been calculated using the CKD EPI equation. This calculation has not been validated in all clinical situations. eGFR's persistently <60 mL/min signify possible Chronic Kidney Disease.    Anion gap 13 5 - 15    Comment: Performed at [facility], [address] [place], [place] [address]  Lipase, blood     Status: None   Collection Time: 06/03/18  4:05 AM  Result Value Ref Range   Lipase 31 11 - 51 U/L    Comment: Performed at [facility], [address] [place], [place] [address]  Brain natriuretic peptide     Status: Abnormal   Collection Time: 06/03/18  4:05 AM  Result Value Ref Range   B Natriuretic Peptide 369.0 (H) 0.0 - 100.0 pg/mL    Comment: Performed at [facility], [address] [place], [place] [address]  Troponin I     Status: Abnormal   Collection Time: 06/03/18  4:05 AM  Result Value Ref Range   Troponin I 0.06 (HH)  <0.03 ng/mL    Comment: CRITICAL RESULT CALLED TO, READ BACK BY AND VERIFIED WITH NOEL WEBSTER ON 06/03/18 AT [facility] Performed at [facility], [address] [place], [place] [address]   CBC with Differential     Status: Abnormal   Collection Time: 06/03/18  4:05 AM  Result Value Ref Range   WBC 13.3 (H) 3.6 - 11.0 K/uL   RBC 2.66 (L) 3.80 - 5.20 MIL/uL   Hemoglobin 6.9 (L) 12.0 - 16.0 g/dL   HCT 23.3 (L) 35.0 - 47.0 %   MCV 87.5 80.0 - 100.0 fL   MCH 25.9 (L) 26.0 - 34.0 pg   MCHC 29.6 (L) 32.0 - 36.0 g/dL   RDW 19.2 (H) 11.5 - 14.5 %   Platelets 710 (H) 150 - 440 K/uL   Neutrophils Relative % 69 %   Neutro Abs 9.1 (H) 1.4 - 6.5 K/uL   Lymphocytes Relative 20 %   Lymphs Abs 2.7 1.0 - 3.6 K/uL   Monocytes Relative 6 %   Monocytes Absolute 0.8 0.2 - 0.9 K/uL   Eosinophils Relative 4 %   Eosinophils Absolute 0.5 0 - 0.7 K/uL   Basophils Relative 1 %   Basophils Absolute 0.1 0 - 0.1 K/uL    Comment: Performed at [facility], [address] [place], [place] [address]  Blood gas, venous     Status: Abnormal   Collection Time: 06/03/18  4:12 AM  Result Value Ref Range   FIO2 100.00    Delivery systems NON-REBREATHER OXYGEN MASK    pH, Ven 7.36 7.250 - 7.430   pCO2, Ven 41 (L) 44.0 - 60.0 mmHg   Bicarbonate 23.2 20.0 - 28.0 mmol/L   Acid-base deficit 2.1 (H) 0.0 - 2.0 mmol/L   Patient temperature 37.0    Collection site VENOUS    Sample type VENOUS     Comment: Performed at [facility], [address] [place], [place] [address]  Prepare RBC  Status: None   Collection Time: 06/03/18  9:59 AM  Result Value Ref Range   Order Confirmation      ORDER PROCESSED BY BLOOD BANK Performed at [facility], [address] [place], [place] [address]   Glucose, capillary     Status: None   Collection Time: 06/03/18 10:02 AM  Result Value Ref Range   Glucose-Capillary 98 70 - 99 mg/dL  MRSA PCR Screening     Status: None   Collection  Time: 06/03/18 10:07 AM  Result Value Ref Range   MRSA by PCR NEGATIVE NEGATIVE    Comment:        The GeneXpert MRSA Assay (FDA approved for NASAL specimens only), is one component of a comprehensive MRSA colonization surveillance program. It is not intended to diagnose MRSA infection nor to guide or monitor treatment for MRSA infections. Performed at [facility] [address] [place], [place] [address]   Type and screen [place]     Status: None   Collection Time: 06/03/18 10:29 AM  Result Value Ref Range   ABO/RH(D) A POS    Antibody Screen NEG    Sample Expiration 06/06/2018    Unit Number [id number]    Blood Component Type RED CELLS,LR    Unit division 00    Status of Unit ISSUED,FINAL    Transfusion Status OK TO TRANSFUSE    Crossmatch Result      Compatible Performed at [facility], [address] [place], [place] [address]   BPAM RBC     Status: None   Collection Time: 06/03/18 10:29 AM  Result Value Ref Range   ISSUE DATE / TIME [id number]    Blood Product Unit Number [id number]    PRODUCT CODE E0382V00    Unit Type and Rh 6200    Blood Product Expiration Date [id number]   TSH     Status: None   Collection Time: 06/03/18 10:33 AM  Result Value Ref Range   TSH 0.527 0.350 - 4.500 uIU/mL    Comment: Performed by a 3rd Generation assay with a functional sensitivity of <=0.01 uIU/mL. Performed at [facility], [address] [place], [place] [address]   Hemoglobin A1c     Status: Abnormal   Collection Time: 06/03/18 10:33 AM  Result Value Ref Range   Hgb A1c MFr Bld 5.9 (H) 4.8 - 5.6 %    Comment: (NOTE)         Prediabetes: 5.7 - 6.4         Diabetes: >6.4         Glycemic control for adults with diabetes: <7.0    Mean Plasma Glucose 123 mg/dL    Comment: (NOTE) Performed At: [facility] [place], [place] [id number] Rush Farmer MD [id number]   Troponin  I     Status: Abnormal   Collection Time: 06/03/18 10:33 AM  Result Value Ref Range   Troponin I 0.05 (HH) <0.03 ng/mL    Comment: CRITICAL VALUE NOTED. VALUE IS CONSISTENT WITH PREVIOUSLY REPORTED/CALLED VALUE SNJ Performed at [facility], [address] [place], [place] [address]   HIV antibody (Routine Screening)     Status: None   Collection Time: 06/03/18 10:33 AM  Result Value Ref Range   HIV Screen 4th Generation wRfx Non Reactive Non Reactive    Comment: (NOTE) Performed At: [facility] [place], [place] [id number] Rush Farmer MD [id number]   Procalcitonin -  Baseline     Status: None   Collection Time: 06/03/18 10:33 AM  Result Value Ref Range   Procalcitonin 0.19 ng/mL    Comment:        Interpretation: PCT (Procalcitonin) <= 0.5 ng/mL: Systemic infection (sepsis) is not likely. Local bacterial infection is possible. (NOTE)       Sepsis PCT Algorithm           Lower Respiratory Tract                                      Infection PCT Algorithm    ----------------------------     ----------------------------         PCT < 0.25 ng/mL                PCT < 0.10 ng/mL         Strongly encourage             Strongly discourage   discontinuation of antibiotics    initiation of antibiotics    ----------------------------     -----------------------------       PCT 0.25 - 0.50 ng/mL            PCT 0.10 - 0.25 ng/mL               OR       >80% decrease in PCT            Discourage initiation of                                            antibiotics      Encourage discontinuation           of antibiotics    ----------------------------     -----------------------------         PCT >= 0.50 ng/mL              PCT 0.26 - 0.50 ng/mL               AND        <80% decrease in PCT             Encourage initiation of                                             antibiotics       Encourage continuation           of antibiotics     ----------------------------     -----------------------------        PCT >= 0.50 ng/mL                  PCT > 0.50 ng/mL               AND         increase in PCT                  Strongly encourage                                      initiation of antibiotics  Strongly encourage escalation           of antibiotics                                     -----------------------------                                           PCT <= 0.25 ng/mL                                                 OR                                        > 80% decrease in PCT                                     Discontinue / Do not initiate                                             antibiotics Performed at [facility], [address] [place], [place] [address]   Magnesium     Status: None   Collection Time: 06/03/18 10:33 AM  Result Value Ref Range   Magnesium 1.7 1.7 - 2.4 mg/dL    Comment: Performed at [facility], [address] [place], [place] [address]  Urinalysis, Routine w reflex microscopic     Status: Abnormal   Collection Time: 06/03/18 12:00 PM  Result Value Ref Range   Color, Urine COLORLESS (A) YELLOW   APPearance CLEAR (A) CLEAR   Specific Gravity, Urine 1.005 1.005 - 1.030   pH 6.0 5.0 - 8.0   Glucose, UA NEGATIVE NEGATIVE mg/dL   Hgb urine dipstick NEGATIVE NEGATIVE   Bilirubin Urine NEGATIVE NEGATIVE   Ketones, ur NEGATIVE NEGATIVE mg/dL   Protein, ur NEGATIVE NEGATIVE mg/dL   Nitrite NEGATIVE NEGATIVE   Leukocytes, UA NEGATIVE NEGATIVE    Comment: Performed at [facility], [address] [place], [place] [address]  Strep pneumoniae urinary antigen     Status: None   Collection Time: 06/03/18 12:00 PM  Result Value Ref Range   Strep Pneumo Urinary Antigen NEGATIVE NEGATIVE    Comment:        Infection due to S. pneumoniae cannot be absolutely ruled out since the antigen present may be below the detection limit of the test. Performed at  [facility], [address] [place], [place] [address]   Hemoglobin     Status: Abnormal   Collection Time: 06/03/18  4:59 PM  Result Value Ref Range   Hemoglobin 7.9 (L) 12.0 - 16.0 g/dL    Comment: Performed at [facility], [address] [place],  [address]  Troponin I     Status: Abnormal   Collection Time: 06/03/18  4:59 PM  Result Value Ref Range   Troponin I  0.03 (HH) <0.03 ng/mL    Comment: CRITICAL VALUE NOTED. VALUE IS CONSISTENT WITH PREVIOUSLY REPORTED/CALLED VALUE SNJ Performed at [facility], [address] [place], [place] [address]   Troponin I     Status: None   Collection Time: 06/03/18 10:05 PM  Result Value Ref Range   Troponin I <0.03 <0.03 ng/mL    Comment: Performed at [facility], [address] [place], [place] [address]  Procalcitonin     Status: None   Collection Time: 06/04/18  6:11 AM  Result Value Ref Range   Procalcitonin 0.18 ng/mL    Comment:        Interpretation: PCT (Procalcitonin) <= 0.5 ng/mL: Systemic infection (sepsis) is not likely. Local bacterial infection is possible. (NOTE)       Sepsis PCT Algorithm           Lower Respiratory Tract                                      Infection PCT Algorithm    ----------------------------     ----------------------------         PCT < 0.25 ng/mL                PCT < 0.10 ng/mL         Strongly encourage             Strongly discourage   discontinuation of antibiotics    initiation of antibiotics    ----------------------------     -----------------------------       PCT 0.25 - 0.50 ng/mL            PCT 0.10 - 0.25 ng/mL               OR       >80% decrease in PCT            Discourage initiation of                                            antibiotics      Encourage discontinuation           of antibiotics    ----------------------------     -----------------------------         PCT >= 0.50 ng/mL              PCT 0.26 - 0.50 ng/mL                AND        <80% decrease in PCT             Encourage initiation of                                             antibiotics       Encourage continuation           of antibiotics    ----------------------------     -----------------------------        PCT >= 0.50 ng/mL                  PCT > 0.50 ng/mL  AND         increase in PCT                  Strongly encourage                                      initiation of antibiotics    Strongly encourage escalation           of antibiotics                                     -----------------------------                                           PCT <= 0.25 ng/mL                                                 OR                                        > 80% decrease in PCT                                     Discontinue / Do not initiate                                             antibiotics Performed at [facility], [address] [place], [place] [address]   CBC     Status: Abnormal   Collection Time: 06/04/18  6:11 AM  Result Value Ref Range   WBC 6.0 3.6 - 11.0 K/uL   RBC 2.81 (L) 3.80 - 5.20 MIL/uL   Hemoglobin 7.7 (L) 12.0 - 16.0 g/dL   HCT 23.6 (L) 35.0 - 47.0 %   MCV 84.2 80.0 - 100.0 fL   MCH 27.4 26.0 - 34.0 pg   MCHC 32.5 32.0 - 36.0 g/dL   RDW 18.2 (H) 11.5 - 14.5 %   Platelets 558 (H) 150 - 440 K/uL    Comment: Performed at [facility], [address] [place], [place] [address]  Basic metabolic panel     Status: Abnormal   Collection Time: 06/04/18  6:11 AM  Result Value Ref Range   Sodium 139 135 - 145 mmol/L   Potassium 3.3 (L) 3.5 - 5.1 mmol/L   Chloride 105 98 - 111 mmol/L   CO2 25 22 - 32 mmol/L   Glucose, Bld 172 (H) 70 - 99 mg/dL   BUN 23 8 - 23 mg/dL   Creatinine, Ser 1.26 (H) 0.44 - 1.00 mg/dL   Calcium 8.5 (L) 8.9 - 10.3 mg/dL   GFR calc non Af Amer 43 (L) >60 mL/min   GFR calc Af Amer 50 (L) >60 mL/min    Comment: (NOTE) The eGFR has  been calculated using the  CKD EPI equation. This calculation has not been validated in all clinical situations. eGFR's persistently <60 mL/min signify possible Chronic Kidney Disease.    Anion gap 9 5 - 15    Comment: Performed at [facility], [address] [place], [place] [address]  Procalcitonin     Status: None   Collection Time: 06/05/18  3:55 AM  Result Value Ref Range   Procalcitonin 0.13 ng/mL    Comment:        Interpretation: PCT (Procalcitonin) <= 0.5 ng/mL: Systemic infection (sepsis) is not likely. Local bacterial infection is possible. (NOTE)       Sepsis PCT Algorithm           Lower Respiratory Tract                                      Infection PCT Algorithm    ----------------------------     ----------------------------         PCT < 0.25 ng/mL                PCT < 0.10 ng/mL         Strongly encourage             Strongly discourage   discontinuation of antibiotics    initiation of antibiotics    ----------------------------     -----------------------------       PCT 0.25 - 0.50 ng/mL            PCT 0.10 - 0.25 ng/mL               OR       >80% decrease in PCT            Discourage initiation of                                            antibiotics      Encourage discontinuation           of antibiotics    ----------------------------     -----------------------------         PCT >= 0.50 ng/mL              PCT 0.26 - 0.50 ng/mL               AND        <80% decrease in PCT             Encourage initiation of                                             antibiotics       Encourage continuation           of antibiotics    ----------------------------     -----------------------------        PCT >= 0.50 ng/mL                  PCT > 0.50 ng/mL               AND         increase in PCT  Strongly encourage                                      initiation of antibiotics    Strongly encourage escalation           of antibiotics                                      -----------------------------                                           PCT <= 0.25 ng/mL                                                 OR                                        > 80% decrease in PCT                                     Discontinue / Do not initiate                                             antibiotics Performed at [facility], [address] [place], [place] [address]   Creatinine, serum     Status: Abnormal   Collection Time: 06/05/18  3:55 AM  Result Value Ref Range   Creatinine, Ser 1.48 (H) 0.44 - 1.00 mg/dL   GFR calc non Af Amer 35 (L) >60 mL/min   GFR calc Af Amer 41 (L) >60 mL/min    Comment: (NOTE) The eGFR has been calculated using the CKD EPI equation. This calculation has not been validated in all clinical situations. eGFR's persistently <60 mL/min signify possible Chronic Kidney Disease. Performed at [facility], [address] [place], [place] [address]   Comprehensive metabolic panel     Status: Abnormal   Collection Time: 06/05/18  9:19 AM  Result Value Ref Range   Sodium 143 135 - 145 mmol/L   Potassium 3.6 3.5 - 5.1 mmol/L   Chloride 112 (H) 98 - 111 mmol/L   CO2 23 22 - 32 mmol/L   Glucose, Bld 201 (H) 70 - 99 mg/dL   BUN 35 (H) 8 - 23 mg/dL   Creatinine, Ser 1.45 (H) 0.44 - 1.00 mg/dL   Calcium 8.9 8.9 - 10.3 mg/dL   Total Protein 7.3 6.5 - 8.1 g/dL   Albumin 2.7 (L) 3.5 - 5.0 g/dL   AST 19 15 - 41 U/L   ALT 14 0 - 44 U/L   Alkaline Phosphatase 50 38 - 126 U/L   Total Bilirubin 0.5 0.3 - 1.2 mg/dL   GFR calc non Af Amer 36 (L) >60 mL/min   GFR calc Af Amer 42 (L) >  60 mL/min    Comment: (NOTE) The eGFR has been calculated using the CKD EPI equation. This calculation has not been validated in all clinical situations. eGFR's persistently <60 mL/min signify possible Chronic Kidney Disease.    Anion gap 8 5 - 15    Comment: Performed at [facility], [address] [place], [place] [address]   CBC with Differential/Platelet     Status: Abnormal   Collection Time: 06/05/18  9:19 AM  Result Value Ref Range   WBC 13.3 (H) 3.6 - 11.0 K/uL   RBC 2.96 (L) 3.80 - 5.20 MIL/uL   Hemoglobin 7.9 (L) 12.0 - 16.0 g/dL   HCT 25.4 (L) 35.0 - 47.0 %   MCV 85.8 80.0 - 100.0 fL   MCH 26.8 26.0 - 34.0 pg   MCHC 31.2 (L) 32.0 - 36.0 g/dL   RDW 19.2 (H) 11.5 - 14.5 %   Platelets 625 (H) 150 - 440 K/uL   Neutrophils Relative % 93 %   Neutro Abs 12.3 (H) 1.4 - 6.5 K/uL   Lymphocytes Relative 5 %   Lymphs Abs 0.7 (L) 1.0 - 3.6 K/uL   Monocytes Relative 2 %   Monocytes Absolute 0.3 0.2 - 0.9 K/uL   Eosinophils Relative 0 %   Eosinophils Absolute 0.0 0 - 0.7 K/uL   Basophils Relative 0 %   Basophils Absolute 0.0 0 - 0.1 K/uL    Comment: Performed at [facility], [address], [place], [place] [address]  Magnesium     Status: None   Collection Time: 06/05/18  9:19 AM  Result Value Ref Range   Magnesium 1.7 1.7 - 2.4 mg/dL    Comment: Performed at [facility], [address] [place], [place] [address]  Basic metabolic panel     Status: Abnormal   Collection Time: 06/07/18  4:30 AM  Result Value Ref Range   Sodium 141 135 - 145 mmol/L   Potassium 3.3 (L) 3.5 - 5.1 mmol/L   Chloride 110 98 - 111 mmol/L   CO2 24 22 - 32 mmol/L   Glucose, Bld 124 (H) 70 - 99 mg/dL   BUN 38 (H) 8 - 23 mg/dL   Creatinine, Ser 1.35 (H) 0.44 - 1.00 mg/dL   Calcium 8.7 (L) 8.9 - 10.3 mg/dL   GFR calc non Af Amer 40 (L) >60 mL/min   GFR calc Af Amer 46 (L) >60 mL/min    Comment: (NOTE) The eGFR has been calculated using the CKD EPI equation. This calculation has not been validated in all clinical situations. eGFR's persistently <60 mL/min signify possible Chronic Kidney Disease.    Anion gap 7 5 - 15    Comment: Performed at [facility], [address] [place], [place] [address]  Magnesium     Status: None   Collection Time: 06/07/18  4:30 AM  Result Value Ref Range    Magnesium 1.9 1.7 - 2.4 mg/dL    Comment: Performed at [facility], [address] [place], [place] [address]  UA/M w/rflx Culture, Routine     Status: Abnormal   Collection Time: 07/17/18 11:40 AM  Result Value Ref Range   Specific Gravity, UA 1.007 1.005 - 1.030   pH, UA 7.5 5.0 - 7.5   Color, UA Yellow Yellow   Appearance Ur Clear Clear   Leukocytes, UA 2+ (A) Negative   Protein, UA Negative Negative/Trace   Glucose, UA Negative Negative   Ketones, UA Negative Negative  RBC, UA Negative Negative   Bilirubin, UA Negative Negative   Urobilinogen, Ur 0.2 0.2 - 1.0 mg/dL   Nitrite, UA Negative Negative   Microscopic Examination See below:     Comment: Microscopic was indicated and was performed.   Urinalysis Reflex Comment     Comment: This specimen has reflexed to a Urine Culture.  Microscopic Examination     Status: None   Collection Time: 07/17/18 11:40 AM  Result Value Ref Range   WBC, UA 0-5 0 - 5 /hpf   RBC, UA 0-2 0 - 2 /hpf   Epithelial Cells (non renal) 0-10 0 - 10 /hpf   Casts None seen None seen /lpf   Mucus, UA Present Not Estab.   Bacteria, UA None seen None seen/Few  Urine Culture, Reflex     Status: None   Collection Time: 07/17/18 11:40 AM  Result Value Ref Range   Urine Culture, Routine Final report    Organism ID, Bacteria No growth     Radiology: Dg Chest Port 1 View  Result Date: 06/03/2018 CLINICAL DATA:  67 y/o  F; shortness of breath and hypoxemia. EXAM: PORTABLE CHEST 1 VIEW COMPARISON:  05/08/2018 CT chest. 04/11/2018 chest radiograph. FINDINGS: Stable cardiac silhouette given projection and technique. Calcific aortic atherosclerosis. Stable medial aspect of right upper lobe consolidation. Stable findings of emphysema and coarse reticulation with basilar predominance compatible with fibrosis. Interval increase in right-sided pleural effusion. Right upper quadrant cholecystectomy clips. No acute osseous abnormality identified. IMPRESSION:  Interval increase in right-sided pleural effusion. Stable right upper lobe consolidation. Stable findings of emphysema, fibrosis, aortic atherosclerosis. Electronically Signed   By: Kristine Garbe M.D.   On: 06/03/2018 06:10    No results found.  No results found.    Assessment and Plan: Patient Active Problem List   Diagnosis Date Noted  . Screening for breast cancer 07/19/2018  . HCAP (healthcare-associated pneumonia) 06/03/2018  . Chronic congestive heart failure with left ventricular diastolic dysfunction ([place]) 04/12/2018  . Obstructive chronic bronchitis without exacerbation ([place]) 02/17/2018  . Major depression, chronic 02/17/2018  . 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 Pollen allergies 01/16/2018  . CHF (congestive heart failure) ([place]) 05/28/2017  . Syncope 02/06/2017  . Pancreatic mass 02/06/2017  . Iron deficiency anemia 05/01/2016  . Anemia in chronic renal disease 04/26/2016  . Insomnia 06/12/2015  . CKD (chronic kidney disease) stage 2, GFR 60-89 ml/min 06/12/2015  . Hypercalcemia 06/12/2015  . Hypothyroidism 06/12/2015  . Pulmonary heart disease ([place]) 06/12/2015  . Hyperlipidemia 06/12/2015  . Hypertension 06/12/2015  . Atherosclerotic heart disease 06/12/2015  . Solitary pulmonary nodule 06/12/2015    1. Obstructive chronic bronchitis without exacerbation ([place]) Patient will continue current medications including inhalers.  She appears to be doing well using Symbicort and albuterol.  2. Chronic respiratory failure with hypoxia ([place]) She will be continued on oxygen therapy.  She should continue uses oxygen 24 hours a day.  3. Oxygen dependent Patient is currently dependent on 2 L/min via nasal cannula continuously.  4. Essential hypertension Stable, continue current management.  5. SOB (shortness of breath) Her FEV1 is 1.8 which is 100% of the pre-predicted value. - Spirometry with Graph  6. Congestive heart failure, unspecified HF chronicity,  unspecified heart failure type ([place]) Continue to follow with cardiology.  General Counseling: I have discussed the findings of the evaluation and examination with Alesha.  I have also discussed any further diagnostic evaluation thatmay be needed or ordered today. Ashara verbalizes understanding of the  findings of todays visit. We also reviewed her medications today and discussed drug interactions and side effects including but not limited excessive drowsiness and altered mental states. We also discussed that there is always a risk not just to her but also people around her. she has been encouraged to call the office with any questions or concerns that should arise related to todays visit.    Time spent: 25 This patient was seen by Orson Gear AGNP-C in Collaboration with Dr. Devona Konig as a part of collaborative care agreement.   I have personally obtained a history, examined the patient, evaluated laboratory and imaging results, formulated the assessment and plan and placed orders.    Allyne Gee, MD [facility] and Critical [facility] Sleep medicine

## 2018-08-14 NOTE — Patient Instructions (Signed)

## 2018-08-17 DIAGNOSIS — J849 Interstitial pulmonary disease, unspecified: Secondary | ICD-10-CM | POA: Diagnosis not present

## 2018-08-17 DIAGNOSIS — I700 Atherosclerosis of aorta: Secondary | ICD-10-CM | POA: Diagnosis not present

## 2018-08-17 DIAGNOSIS — J438 Other emphysema: Secondary | ICD-10-CM | POA: Diagnosis not present

## 2018-08-17 DIAGNOSIS — Z87891 Personal history of nicotine dependence: Secondary | ICD-10-CM | POA: Diagnosis not present

## 2018-08-17 DIAGNOSIS — R0602 Shortness of breath: Secondary | ICD-10-CM | POA: Diagnosis not present

## 2018-08-17 DIAGNOSIS — J9611 Chronic respiratory failure with hypoxia: Secondary | ICD-10-CM | POA: Diagnosis not present

## 2018-08-31 DIAGNOSIS — J8410 Pulmonary fibrosis, unspecified: Secondary | ICD-10-CM | POA: Diagnosis not present

## 2018-08-31 DIAGNOSIS — J9621 Acute and chronic respiratory failure with hypoxia: Secondary | ICD-10-CM | POA: Diagnosis not present

## 2018-08-31 DIAGNOSIS — E039 Hypothyroidism, unspecified: Secondary | ICD-10-CM | POA: Diagnosis not present

## 2018-08-31 DIAGNOSIS — J449 Chronic obstructive pulmonary disease, unspecified: Secondary | ICD-10-CM | POA: Diagnosis not present

## 2018-08-31 DIAGNOSIS — Z9981 Dependence on supplemental oxygen: Secondary | ICD-10-CM | POA: Diagnosis not present

## 2018-08-31 DIAGNOSIS — Z01818 Encounter for other preprocedural examination: Secondary | ICD-10-CM | POA: Diagnosis not present

## 2018-08-31 DIAGNOSIS — D631 Anemia in chronic kidney disease: Secondary | ICD-10-CM | POA: Diagnosis not present

## 2018-08-31 DIAGNOSIS — Z992 Dependence on renal dialysis: Secondary | ICD-10-CM | POA: Diagnosis not present

## 2018-08-31 DIAGNOSIS — I120 Hypertensive chronic kidney disease with stage 5 chronic kidney disease or end stage renal disease: Secondary | ICD-10-CM | POA: Diagnosis not present

## 2018-08-31 DIAGNOSIS — N186 End stage renal disease: Secondary | ICD-10-CM | POA: Diagnosis not present

## 2018-08-31 DIAGNOSIS — R9431 Abnormal electrocardiogram [ECG] [EKG]: Secondary | ICD-10-CM | POA: Diagnosis not present

## 2018-08-31 DIAGNOSIS — Z8719 Personal history of other diseases of the digestive system: Secondary | ICD-10-CM | POA: Diagnosis not present

## 2018-09-04 DIAGNOSIS — E039 Hypothyroidism, unspecified: Secondary | ICD-10-CM | POA: Diagnosis not present

## 2018-09-04 DIAGNOSIS — J209 Acute bronchitis, unspecified: Secondary | ICD-10-CM | POA: Diagnosis not present

## 2018-09-04 DIAGNOSIS — J208 Acute bronchitis due to other specified organisms: Secondary | ICD-10-CM | POA: Diagnosis not present

## 2018-09-04 DIAGNOSIS — N182 Chronic kidney disease, stage 2 (mild): Secondary | ICD-10-CM | POA: Diagnosis not present

## 2018-09-04 DIAGNOSIS — K219 Gastro-esophageal reflux disease without esophagitis: Secondary | ICD-10-CM | POA: Diagnosis not present

## 2018-09-04 DIAGNOSIS — Z7982 Long term (current) use of aspirin: Secondary | ICD-10-CM | POA: Diagnosis not present

## 2018-09-04 DIAGNOSIS — I129 Hypertensive chronic kidney disease with stage 1 through stage 4 chronic kidney disease, or unspecified chronic kidney disease: Secondary | ICD-10-CM | POA: Diagnosis not present

## 2018-09-04 DIAGNOSIS — Z79899 Other long term (current) drug therapy: Secondary | ICD-10-CM | POA: Diagnosis not present

## 2018-09-04 DIAGNOSIS — J849 Interstitial pulmonary disease, unspecified: Secondary | ICD-10-CM | POA: Diagnosis not present

## 2018-09-04 DIAGNOSIS — J9611 Chronic respiratory failure with hypoxia: Secondary | ICD-10-CM | POA: Diagnosis not present

## 2018-09-04 DIAGNOSIS — I2510 Atherosclerotic heart disease of native coronary artery without angina pectoris: Secondary | ICD-10-CM | POA: Diagnosis not present

## 2018-09-04 DIAGNOSIS — I700 Atherosclerosis of aorta: Secondary | ICD-10-CM | POA: Diagnosis not present

## 2018-09-04 DIAGNOSIS — Z87891 Personal history of nicotine dependence: Secondary | ICD-10-CM | POA: Diagnosis not present

## 2018-09-04 DIAGNOSIS — J449 Chronic obstructive pulmonary disease, unspecified: Secondary | ICD-10-CM | POA: Diagnosis not present

## 2018-09-04 DIAGNOSIS — D649 Anemia, unspecified: Secondary | ICD-10-CM | POA: Diagnosis not present

## 2018-09-04 DIAGNOSIS — Z9981 Dependence on supplemental oxygen: Secondary | ICD-10-CM | POA: Diagnosis not present

## 2018-09-05 ENCOUNTER — Emergency Department: Payer: Medicare Other

## 2018-09-05 ENCOUNTER — Inpatient Hospital Stay
Admission: EM | Admit: 2018-09-05 | Discharge: 2018-09-10 | DRG: 208 | Disposition: A | Discharge status: Home / Self Care | Payer: Medicare Other | Attending: Internal Medicine | Admitting: Internal Medicine

## 2018-09-05 ENCOUNTER — Other Ambulatory Visit: Payer: Self-pay

## 2018-09-05 ENCOUNTER — Encounter: Payer: Self-pay | Admitting: Internal Medicine

## 2018-09-05 DIAGNOSIS — D649 Anemia, unspecified: Secondary | ICD-10-CM | POA: Diagnosis not present

## 2018-09-05 DIAGNOSIS — I130 Hypertensive heart and chronic kidney disease with heart failure and stage 1 through stage 4 chronic kidney disease, or unspecified chronic kidney disease: Secondary | ICD-10-CM | POA: Diagnosis present

## 2018-09-05 DIAGNOSIS — J90 Pleural effusion, not elsewhere classified: Secondary | ICD-10-CM | POA: Diagnosis not present

## 2018-09-05 DIAGNOSIS — J9621 Acute and chronic respiratory failure with hypoxia: Secondary | ICD-10-CM | POA: Diagnosis not present

## 2018-09-05 DIAGNOSIS — Z8679 Personal history of other diseases of the circulatory system: Secondary | ICD-10-CM | POA: Diagnosis not present

## 2018-09-05 DIAGNOSIS — J984 Other disorders of lung: Secondary | ICD-10-CM | POA: Diagnosis present

## 2018-09-05 DIAGNOSIS — N182 Chronic kidney disease, stage 2 (mild): Secondary | ICD-10-CM | POA: Diagnosis present

## 2018-09-05 DIAGNOSIS — R0689 Other abnormalities of breathing: Secondary | ICD-10-CM | POA: Diagnosis not present

## 2018-09-05 DIAGNOSIS — J9601 Acute respiratory failure with hypoxia: Secondary | ICD-10-CM

## 2018-09-05 DIAGNOSIS — E440 Moderate protein-calorie malnutrition: Secondary | ICD-10-CM | POA: Diagnosis present

## 2018-09-05 DIAGNOSIS — J9600 Acute respiratory failure, unspecified whether with hypoxia or hypercapnia: Secondary | ICD-10-CM

## 2018-09-05 DIAGNOSIS — J441 Chronic obstructive pulmonary disease with (acute) exacerbation: Secondary | ICD-10-CM | POA: Diagnosis not present

## 2018-09-05 DIAGNOSIS — J189 Pneumonia, unspecified organism: Secondary | ICD-10-CM | POA: Diagnosis present

## 2018-09-05 DIAGNOSIS — Z6825 Body mass index (BMI) 25.0-25.9, adult: Secondary | ICD-10-CM

## 2018-09-05 DIAGNOSIS — E785 Hyperlipidemia, unspecified: Secondary | ICD-10-CM | POA: Diagnosis present

## 2018-09-05 DIAGNOSIS — R069 Unspecified abnormalities of breathing: Secondary | ICD-10-CM | POA: Diagnosis not present

## 2018-09-05 DIAGNOSIS — Z8249 Family history of ischemic heart disease and other diseases of the circulatory system: Secondary | ICD-10-CM

## 2018-09-05 DIAGNOSIS — Z87891 Personal history of nicotine dependence: Secondary | ICD-10-CM

## 2018-09-05 DIAGNOSIS — R0602 Shortness of breath: Secondary | ICD-10-CM | POA: Diagnosis not present

## 2018-09-05 DIAGNOSIS — Z7951 Long term (current) use of inhaled steroids: Secondary | ICD-10-CM

## 2018-09-05 DIAGNOSIS — R0902 Hypoxemia: Secondary | ICD-10-CM | POA: Diagnosis not present

## 2018-09-05 DIAGNOSIS — J8410 Pulmonary fibrosis, unspecified: Secondary | ICD-10-CM | POA: Diagnosis present

## 2018-09-05 DIAGNOSIS — I5032 Chronic diastolic (congestive) heart failure: Secondary | ICD-10-CM | POA: Diagnosis present

## 2018-09-05 DIAGNOSIS — I2510 Atherosclerotic heart disease of native coronary artery without angina pectoris: Secondary | ICD-10-CM | POA: Diagnosis present

## 2018-09-05 DIAGNOSIS — I279 Pulmonary heart disease, unspecified: Secondary | ICD-10-CM | POA: Diagnosis present

## 2018-09-05 DIAGNOSIS — J849 Interstitial pulmonary disease, unspecified: Secondary | ICD-10-CM | POA: Diagnosis present

## 2018-09-05 DIAGNOSIS — E039 Hypothyroidism, unspecified: Secondary | ICD-10-CM | POA: Diagnosis present

## 2018-09-05 DIAGNOSIS — Z9981 Dependence on supplemental oxygen: Secondary | ICD-10-CM

## 2018-09-05 DIAGNOSIS — Z79890 Hormone replacement therapy (postmenopausal): Secondary | ICD-10-CM

## 2018-09-05 DIAGNOSIS — J440 Chronic obstructive pulmonary disease with acute lower respiratory infection: Secondary | ICD-10-CM | POA: Diagnosis not present

## 2018-09-05 DIAGNOSIS — Z79899 Other long term (current) drug therapy: Secondary | ICD-10-CM

## 2018-09-05 DIAGNOSIS — R000 Tachycardia, unspecified: Secondary | ICD-10-CM | POA: Diagnosis not present

## 2018-09-05 DIAGNOSIS — F329 Major depressive disorder, single episode, unspecified: Secondary | ICD-10-CM | POA: Diagnosis present

## 2018-09-05 DIAGNOSIS — R570 Cardiogenic shock: Secondary | ICD-10-CM | POA: Diagnosis present

## 2018-09-05 DIAGNOSIS — Z8701 Personal history of pneumonia (recurrent): Secondary | ICD-10-CM

## 2018-09-05 DIAGNOSIS — D509 Iron deficiency anemia, unspecified: Secondary | ICD-10-CM | POA: Diagnosis present

## 2018-09-05 DIAGNOSIS — R61 Generalized hyperhidrosis: Secondary | ICD-10-CM | POA: Diagnosis not present

## 2018-09-05 DIAGNOSIS — J9622 Acute and chronic respiratory failure with hypercapnia: Secondary | ICD-10-CM | POA: Diagnosis present

## 2018-09-05 DIAGNOSIS — D631 Anemia in chronic kidney disease: Secondary | ICD-10-CM | POA: Diagnosis present

## 2018-09-05 DIAGNOSIS — Z7982 Long term (current) use of aspirin: Secondary | ICD-10-CM

## 2018-09-05 LAB — COMPREHENSIVE METABOLIC PANEL
ALK PHOS: 66 U/L (ref 38–126)
ALT: 9 U/L (ref 0–44)
ANION GAP: 9 (ref 5–15)
AST: 17 U/L (ref 15–41)
Albumin: 2.9 g/dL — ABNORMAL LOW (ref 3.5–5.0)
BUN: 13 mg/dL (ref 8–23)
CO2: 26 mmol/L (ref 22–32)
Calcium: 8.2 mg/dL — ABNORMAL LOW (ref 8.9–10.3)
Chloride: 106 mmol/L (ref 98–111)
Creatinine, Ser: 1.31 mg/dL — ABNORMAL HIGH (ref 0.44–1.00)
GFR calc Af Amer: 49 mL/min — ABNORMAL LOW (ref >60)
GFR calc non Af Amer: 42 mL/min — ABNORMAL LOW (ref >60)
Glucose, Bld: 110 mg/dL — ABNORMAL HIGH (ref 70–99)
Potassium: 3.4 mmol/L — ABNORMAL LOW (ref 3.5–5.1)
Sodium: 141 mmol/L (ref 135–145)
Total Bilirubin: 0.6 mg/dL (ref 0.3–1.2)
Total Protein: 7.8 g/dL (ref 6.5–8.1)

## 2018-09-05 LAB — INFLUENZA PANEL BY PCR (TYPE A & B)
Influenza A By PCR: NEGATIVE
Influenza B By PCR: NEGATIVE

## 2018-09-05 LAB — CBC WITH DIFFERENTIAL/PLATELET
Abs Immature Granulocytes: 0.1 10*3/uL — ABNORMAL HIGH (ref 0.00–0.07)
Basophils Absolute: 0.1 10*3/uL (ref 0.0–0.1)
Basophils Relative: 1 %
Eosinophils Absolute: 0.1 10*3/uL (ref 0.0–0.5)
Eosinophils Relative: 1 %
HCT: 21.4 % — ABNORMAL LOW (ref 36.0–46.0)
HEMOGLOBIN: 6 g/dL — AB (ref 12.0–15.0)
Immature Granulocytes: 1 %
Lymphocytes Relative: 16 %
Lymphs Abs: 2.3 10*3/uL (ref 0.7–4.0)
MCH: 21.6 pg — ABNORMAL LOW (ref 26.0–34.0)
MCHC: 28 g/dL — ABNORMAL LOW (ref 30.0–36.0)
MCV: 77 fL — ABNORMAL LOW (ref 80.0–100.0)
Monocytes Absolute: 1.1 10*3/uL — ABNORMAL HIGH (ref 0.1–1.0)
Monocytes Relative: 8 %
Neutro Abs: 10.7 10*3/uL — ABNORMAL HIGH (ref 1.7–7.7)
Neutrophils Relative %: 73 %
Platelets: 625 10*3/uL — ABNORMAL HIGH (ref 150–400)
RBC: 2.78 MIL/uL — ABNORMAL LOW (ref 3.87–5.11)
RDW: 18.5 % — ABNORMAL HIGH (ref 11.5–15.5)
WBC: 14.5 10*3/uL — ABNORMAL HIGH (ref 4.0–10.5)
nRBC: 0.1 % (ref 0.0–0.2)

## 2018-09-05 LAB — GLUCOSE, CAPILLARY: Glucose-Capillary: 173 mg/dL — ABNORMAL HIGH (ref 70–99)

## 2018-09-05 LAB — BLOOD GAS, VENOUS
ACID-BASE DEFICIT: 1.1 mmol/L (ref 0.0–2.0)
Bicarbonate: 24.3 mmol/L (ref 20.0–28.0)
O2 Saturation: 96.1 %
Patient temperature: 37
pCO2, Ven: 42 mmHg — ABNORMAL LOW (ref 44.0–60.0)
pH, Ven: 7.37 (ref 7.250–7.430)
pO2, Ven: 85 mmHg — ABNORMAL HIGH (ref 32.0–45.0)

## 2018-09-05 LAB — BRAIN NATRIURETIC PEPTIDE: B Natriuretic Peptide: 431 pg/mL — ABNORMAL HIGH (ref 0.0–100.0)

## 2018-09-05 LAB — LACTIC ACID, PLASMA: Lactic Acid, Venous: 3.4 mmol/L (ref 0.5–1.9)

## 2018-09-05 LAB — PROCALCITONIN: Procalcitonin: 0.23 ng/mL

## 2018-09-05 LAB — FERRITIN: Ferritin: 18 ng/mL (ref 11–307)

## 2018-09-05 LAB — PREPARE RBC (CROSSMATCH)

## 2018-09-05 LAB — MRSA PCR SCREENING: MRSA by PCR: NEGATIVE

## 2018-09-05 LAB — TROPONIN I: Troponin I: 0.04 ng/mL (ref <0.03)

## 2018-09-05 MED ORDER — BUDESONIDE 0.5 MG/2ML IN SUSP
0.5000 mg | Freq: Two times a day (BID) | RESPIRATORY_TRACT | Status: DC
  Administered 2018-09-05 – 2018-09-10 (×11): 0.5 mg via RESPIRATORY_TRACT
  Filled 2018-09-05 (×13): qty 2

## 2018-09-05 MED ORDER — LORAZEPAM 2 MG/ML IJ SOLN
2.0000 mg | INTRAMUSCULAR | Status: DC | PRN
  Administered 2018-09-06: 2 mg via INTRAVENOUS
  Filled 2018-09-05: qty 1

## 2018-09-05 MED ORDER — NOREPINEPHRINE BITARTRATE 1 MG/ML IV SOLN
0.0000 ug/min | INTRAVENOUS | Status: DC
  Administered 2018-09-05: 2 ug/min via INTRAVENOUS
  Administered 2018-09-06: 10 ug/min via INTRAVENOUS
  Filled 2018-09-05 (×4): qty 4

## 2018-09-05 MED ORDER — FENTANYL 2500MCG IN NS 250ML (10MCG/ML) PREMIX INFUSION
0.0000 ug/h | INTRAVENOUS | Status: DC
  Administered 2018-09-05 (×4): 100 ug/h via INTRAVENOUS
  Administered 2018-09-06 – 2018-09-07 (×2): 200 ug/h via INTRAVENOUS
  Filled 2018-09-05 (×4): qty 250

## 2018-09-05 MED ORDER — CHLORHEXIDINE GLUCONATE 0.12% ORAL RINSE (MEDLINE KIT)
15.0000 mL | Freq: Two times a day (BID) | OROMUCOSAL | Status: DC
  Administered 2018-09-05 – 2018-09-07 (×5): 15 mL via OROMUCOSAL

## 2018-09-05 MED ORDER — FENTANYL CITRATE (PF) 100 MCG/2ML IJ SOLN: 200.0000 ug | Freq: Once | INTRAMUSCULAR | Status: DC

## 2018-09-05 MED ORDER — PROPOFOL 1000 MG/100ML IV EMUL
324.5000 ug/min | INTRAVENOUS | Status: DC
  Administered 2018-09-05: 30 ug/kg/min via INTRAVENOUS
  Administered 2018-09-05: 5 ug/kg/min via INTRAVENOUS
  Administered 2018-09-05: 2916.667 ug/min via INTRAVENOUS
  Administered 2018-09-05: 20 ug/kg/min via INTRAVENOUS
  Administered 2018-09-05: 50 ug/min via INTRAVENOUS
  Filled 2018-09-05 (×3): qty 100

## 2018-09-05 MED ORDER — FENTANYL CITRATE (PF) 100 MCG/2ML IJ SOLN
INTRAMUSCULAR | Status: DC | PRN
  Administered 2018-09-05: 200 ug via INTRAVENOUS

## 2018-09-05 MED ORDER — FENTANYL 2500MCG IN NS 250ML (10MCG/ML) PREMIX INFUSION
INTRAVENOUS | Status: AC
  Administered 2018-09-05: 100 ug/h via INTRAVENOUS
  Filled 2018-09-05: qty 250

## 2018-09-05 MED ORDER — SODIUM CHLORIDE 0.9 % IV BOLUS
1000.0000 mL | Freq: Once | INTRAVENOUS | Status: AC
  Administered 2018-09-05: 1000 mL via INTRAVENOUS

## 2018-09-05 MED ORDER — PROPOFOL 1000 MG/100ML IV EMUL
5.0000 ug/kg/min | INTRAVENOUS | Status: DC
  Administered 2018-09-05: 40 ug/kg/min via INTRAVENOUS
  Administered 2018-09-06: 50 ug/kg/min via INTRAVENOUS
  Administered 2018-09-06: 35 ug/kg/min via INTRAVENOUS
  Filled 2018-09-05 (×2): qty 100

## 2018-09-05 MED ORDER — PANTOPRAZOLE SODIUM 40 MG IV SOLR
40.0000 mg | INTRAVENOUS | Status: DC
  Administered 2018-09-05 – 2018-09-08 (×4): 40 mg via INTRAVENOUS
  Filled 2018-09-05 (×4): qty 40

## 2018-09-05 MED ORDER — IPRATROPIUM-ALBUTEROL 0.5-2.5 (3) MG/3ML IN SOLN
3.0000 mL | Freq: Once | RESPIRATORY_TRACT | Status: AC
  Administered 2018-09-05: 3 mL via RESPIRATORY_TRACT
  Filled 2018-09-05: qty 3

## 2018-09-05 MED ORDER — SODIUM CHLORIDE 0.9 % IV SOLN
10.0000 mL/h | Freq: Once | INTRAVENOUS | Status: AC
  Administered 2018-09-05: 10 mL/h via INTRAVENOUS

## 2018-09-05 MED ORDER — KETAMINE HCL 10 MG/ML IJ SOLN: 150.0000 mg | Freq: Once | INTRAMUSCULAR | Status: DC

## 2018-09-05 MED ORDER — ONDANSETRON HCL 4 MG PO TABS: 4.0000 mg | ORAL_TABLET | Freq: Four times a day (QID) | ORAL | Status: DC | PRN

## 2018-09-05 MED ORDER — VECURONIUM BROMIDE 10 MG IV SOLR
6.5000 mg | INTRAVENOUS | Status: AC
  Administered 2018-09-05: 6.5 mg via INTRAVENOUS
  Filled 2018-09-05 (×2): qty 10

## 2018-09-05 MED ORDER — IPRATROPIUM-ALBUTEROL 0.5-2.5 (3) MG/3ML IN SOLN
3.0000 mL | Freq: Four times a day (QID) | RESPIRATORY_TRACT | Status: DC
  Administered 2018-09-05 (×2): 3 mL via RESPIRATORY_TRACT
  Filled 2018-09-05 (×2): qty 3

## 2018-09-05 MED ORDER — KETAMINE HCL 10 MG/ML IJ SOLN
INTRAMUSCULAR | Status: DC | PRN
  Administered 2018-09-05: 150 mg via INTRAVENOUS

## 2018-09-05 MED ORDER — MAGNESIUM SULFATE 2 GM/50ML IV SOLN
2.0000 g | Freq: Once | INTRAVENOUS | Status: AC
  Administered 2018-09-05: 2 g via INTRAVENOUS
  Filled 2018-09-05: qty 50

## 2018-09-05 MED ORDER — PROPOFOL 10 MG/ML IV BOLUS
60.0000 mg | Freq: Once | INTRAVENOUS | Status: AC
  Administered 2018-09-05: 60 mg via INTRAVENOUS

## 2018-09-05 MED ORDER — METHYLPREDNISOLONE SODIUM SUCC 125 MG IJ SOLR
60.0000 mg | Freq: Two times a day (BID) | INTRAMUSCULAR | Status: DC
  Administered 2018-09-05 – 2018-09-07 (×6): 60 mg via INTRAVENOUS
  Filled 2018-09-05 (×6): qty 2

## 2018-09-05 MED ORDER — IPRATROPIUM-ALBUTEROL 0.5-2.5 (3) MG/3ML IN SOLN
3.0000 mL | RESPIRATORY_TRACT | Status: DC
  Administered 2018-09-05 – 2018-09-09 (×22): 3 mL via RESPIRATORY_TRACT
  Filled 2018-09-05 (×20): qty 3

## 2018-09-05 MED ORDER — SODIUM CHLORIDE 0.9 % IV SOLN
2.0000 g | Freq: Once | INTRAVENOUS | Status: AC
  Administered 2018-09-05: 2 g via INTRAVENOUS
  Filled 2018-09-05: qty 2

## 2018-09-05 MED ORDER — ROCURONIUM BROMIDE 50 MG/5ML IV SOLN
INTRAVENOUS | Status: DC | PRN
  Administered 2018-09-05: 80 mg via INTRAVENOUS

## 2018-09-05 MED ORDER — SODIUM CHLORIDE 0.9 % IV SOLN
500.0000 mg | Freq: Once | INTRAVENOUS | Status: AC
  Administered 2018-09-05: 500 mg via INTRAVENOUS
  Filled 2018-09-05: qty 500

## 2018-09-05 MED ORDER — ROCURONIUM BROMIDE 50 MG/5ML IV SOLN: 80.0000 mg | Freq: Once | INTRAVENOUS | Status: DC

## 2018-09-05 MED ORDER — ACETAMINOPHEN 650 MG RE SUPP: 650.0000 mg | Freq: Four times a day (QID) | RECTAL | Status: DC | PRN

## 2018-09-05 MED ORDER — METHYLPREDNISOLONE SODIUM SUCC 125 MG IJ SOLR
125.0000 mg | Freq: Once | INTRAMUSCULAR | Status: AC
  Administered 2018-09-05: 125 mg via INTRAVENOUS
  Filled 2018-09-05: qty 2

## 2018-09-05 MED ORDER — ORAL CARE MOUTH RINSE
15.0000 mL | OROMUCOSAL | Status: DC
  Administered 2018-09-05 – 2018-09-08 (×28): 15 mL via OROMUCOSAL

## 2018-09-05 MED ORDER — PROPOFOL 1000 MG/100ML IV EMUL
INTRAVENOUS | Status: AC
  Administered 2018-09-05: 5 ug/kg/min via INTRAVENOUS
  Filled 2018-09-05: qty 100

## 2018-09-05 MED ORDER — LEVOTHYROXINE SODIUM 50 MCG PO TABS
50.0000 ug | ORAL_TABLET | Freq: Every day | ORAL | Status: DC
  Administered 2018-09-06 – 2018-09-10 (×4): 50 ug via ORAL
  Filled 2018-09-05 (×5): qty 1

## 2018-09-05 MED ORDER — ONDANSETRON HCL 4 MG/2ML IJ SOLN: 4.0000 mg | Freq: Four times a day (QID) | INTRAMUSCULAR | Status: DC | PRN

## 2018-09-05 MED ORDER — ACETAMINOPHEN 325 MG PO TABS: 650.0000 mg | ORAL_TABLET | Freq: Four times a day (QID) | ORAL | Status: DC | PRN

## 2018-09-05 MED ORDER — SODIUM CHLORIDE 0.9 % IV SOLN
2.0000 g | Freq: Two times a day (BID) | INTRAVENOUS | Status: DC
  Administered 2018-09-05 – 2018-09-07 (×4): 2 g via INTRAVENOUS
  Filled 2018-09-05 (×5): qty 2

## 2018-09-05 NOTE — Progress Notes (Signed)
Pharmacy Antibiotic Note  Melissa Knox is a 68 y.o. female admitted on 09/05/2018 with pneumonia.  Pharmacy has been consulted for Cefepime dosing. S/p bronch 09/04/2018.  Hx of vaping  Plan: Patient received Azithromycin x 1 and Cefepime 2 gram x 1 in ED. Will continue with Cefepime 2 gram IV q12h    Weight: 143 lb 1.3 oz (64.9 kg)  Temp (24hrs), Avg:98.7 F (37.1 C), Min:98.2 F (36.8 C), Max:99.3 F (37.4 C)  Recent Labs  Lab 09/05/18 0648  WBC 14.5*  CREATININE 1.31*  LATICACIDVEN 3.4*    Estimated Creatinine Clearance: 35.1 mL/min (A) (by C-G formula based on SCr of 1.31 mg/dL (H)).    No Known Allergies  Antimicrobials this admission: Azith x1 1/7 >>   Cefepime 1/7 >>    Dose adjustments this admission:    Microbiology results: 1/7 BCx: pend   UCx:      Sputum:      MRSA PCR:    Thank you for allowing pharmacy to be a part of this patient's care.  Trayven Lumadue A 09/05/2018 10:37 AM

## 2018-09-05 NOTE — Consult Note (Addendum)
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 Insomnia 06/12/2015  . CKD (chronic kidney disease) stage 2, GFR 60-89 ml/min 06/12/2015  . Hypercalcemia 06/12/2015  . Hypothyroidism 06/12/2015  . Pulmonary heart disease ([place]) 06/12/2015  . Hyperlipidemia 06/12/2015  . Hypertension 06/12/2015  . Atherosclerotic heart disease 06/12/2015  . Solitary pulmonary nodule 06/12/2015  . Anemia in chronic renal disease 04/26/2016  . Iron deficiency anemia 05/01/2016  . Syncope 02/06/2017  . Pancreatic mass 02/06/2017  . CHF (congestive heart failure) ([place]) 05/28/2017  . Pollen allergies 01/16/2018  . 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 Obstructive chronic bronchitis without exacerbation ([place]) 02/17/2018  . Major depression, chronic 02/17/2018  . Chronic congestive heart failure with left ventricular diastolic dysfunction ([place]) 04/12/2018  . HCAP (healthcare-associated pneumonia) 06/03/2018  . Screening for breast cancer 07/19/2018   Resolved Ambulatory Problems    Diagnosis Date Noted  . Shortness of breath 06/12/2015  . COPD with acute exacerbation ([place]) 06/12/2015  . Acute blood loss anemia 02/06/2017  . GI bleed 02/06/2017  . Abdominal pain   . Acute on chronic respiratory failure with hypoxia ([place]) 01/17/2018  . Acute on chronic respiratory failure ([place]) 04/11/2018  . Interstitial pneumonia ([place]) 04/12/2018   Past Medical History:  Diagnosis Date  . COPD (chronic obstructive  pulmonary disease) (H[facility])     SIGNIFICANT EVENTS 1/7 intubated   BP (!) 98/50 (BP Location: Left Arm)   Pulse 85   Temp 98.1 F (36.7 C) (Bladder)   Resp 14   Ht '5\' 4"'$  (1.626 m)   Wt 68.2 kg   SpO2 97%   BMI 25.81 kg/m    REVIEW OF SYSTEMS  PATIENT IS UNABLE TO PROVIDE COMPLETE REVIEW OF SYSTEMS DUE TO SEVERE CRITICAL ILLNESS   PHYSICAL EXAMINATION:  GENERAL:critically ill appearing, +resp distress HEAD: Normocephalic, atraumatic.  EYES: Pupils equal, round, reactive to light.  No scleral icterus.  MOUTH: Moist mucosal membrane. NECK: Supple. No thyromegaly. No nodules. No JVD.  PULMONARY: +rhonchi, +wheezing CARDIOVASCULAR: S1 and S2. Regular rate and rhythm. No murmurs, rubs, or gallops.  GASTROINTESTINAL: Soft, nontender, -distended. No masses. Positive bowel sounds. No hepatosplenomegaly.  MUSCULOSKELETAL: No swelling, clubbing, or edema.  NEUROLOGIC: obtunded, GCS<8 SKIN:intact,warm,dry      Indwelling Urinary Catheter continued, requirement due to   Reason to continue Indwelling Urinary Catheter for strict Intake/Output monitoring for hemodynamic instability         Ventilator continued, requirement due to, resp failure    Ventilator Sedation RASS 0 to -2     ASSESSMENT AND PLAN SYNOPSIS   Severe Hypoxic and Hypercapnic Respiratory Failure from underlying ILD with fibrosis and COPD exacerbation -continue Full MV support -continue Bronchodilator Therapy -Wean Fio2 and PEEP as tolerated -will perform SAT/SBT when respiratory parameters are met I anticipate patient will have prolonged ICU and VENT  LOS based on severe underlying lung disease  SEVERE COPD EXACERBATION -continue IV steroids as prescribed -continue NEB THERAPY as prescribed -morphine as needed -wean fio2  as needed and tolerated    NEUROLOGY - intubated and sedated - minimal sedation to achieve a RASS goal: -1 Wake up assessment pending   ID-treat as pneumonia -continue IV  abx as prescibed -follow up cultures  GI/Nutrition GI PROPHYLAXIS as indicated DIET-->start TF's as tolerated Constipation protocol as indicated  ENDO - ICU hypoglycemic\Hyperglycemia protocol -check FSBS per protocol   ELECTROLYTES -follow labs as needed -replace as needed -pharmacy consultation and following   DVT/GI PRX ordered TRANSFUSIONS AS NEEDED MONITOR FSBS ASSESS the need for LABS as needed   Critical Care Time devoted to patient care services described in this note is 45  minutes.   Overall, patient is critically ill, prognosis is guarded.  Patient with Multiorgan failure and at high risk for cardiac arrest and death.   Recommend Palliative care consultation  Corrin Parker, M.D.  [facility]  Medical Director [place] Director [place] Department

## 2018-09-05 NOTE — ED Triage Notes (Signed)
Pt BIB EMS from home for SOB. PT has a hx of COPD and CHF. Pt has a bronchoscopy yesterday. Hypoxic, spo2 in the 40's on arrival. Pt is in and out of consciousness. MD at bedside.

## 2018-09-05 NOTE — ED Provider Notes (Signed)
-----------------------------------------   10:09 AM on 09/05/2018 ----------------------------------------- Patient has been boarding in the ED while waiting for an ICU bed.  I have returned to the bedside several times to help with vent management and sedation titration.  Given 2 boluses of fentanyl on top of her continuous infusion, 1 bolus of propofol on top of the continuous infusion.  Most recently, we have had episodes of blood pressure dipping down below a map of 65 with adequate sedation so norepinephrine was started and is titrating.  Oxygenation on the vent is excellent, limited by patient asynchrony whenever she is agitated.  I am giving her IV vecuronium since she will need to continue through today at least due to her critical pulmonary illness to assist with ventilation and safety.  RT reported the suction some blood from the endotracheal tube.  This was a small amount, not hemorrhage, not the source of anemia and not representing a serious complication of bronchoscopy.  Pt's spouse at bedside updated and educated on goals of sedation.  Still waiting for transfusion.  .Critical Care Performed by: Carrie Mew, MD Authorized by: Carrie Mew, MD   Critical care provider statement:    Critical care time (minutes):  50   Critical care time was exclusive of:  Separately billable procedures and treating other patients   Critical care was necessary to treat or prevent imminent or life-threatening deterioration of the following conditions:  Respiratory failure, shock and CNS failure or compromise   Critical care was time spent personally by me on the following activities:  Development of treatment plan with patient or surrogate, discussions with consultants, evaluation of patient's response to treatment, examination of patient, obtaining history from patient or surrogate, ordering and performing treatments and interventions, ordering and review of laboratory studies, ordering and  review of radiographic studies, pulse oximetry, re-evaluation of patient's condition and review of old charts      Carrie Mew, MD 09/05/18 1013

## 2018-09-05 NOTE — ED Notes (Signed)
MD states to administer 500 mL bolus NS at this time to increase BP

## 2018-09-05 NOTE — ED Notes (Signed)
Pt oxygen desat into 60's at this time, resp called and now in room, pt suctioned and red frothy secretions with suctioning.

## 2018-09-05 NOTE — ED Provider Notes (Signed)
[facility] Emergency Department Provider Note  ____________________________________________   First MD Initiated Contact with Patient 09/05/18 [phone]     (approximate)  I have reviewed the triage vital signs and the nursing notes.   HISTORY  Chief Complaint Shortness of Breath  Level 5 exemption history limited by the patient's critical condition  HPI Melissa Knox is a 68 y.o. female who comes to the emergency department with severe respiratory distress.  History obtained largely from the patient's husband later after the patient was already intubated.  She has a past medical history of congestive heart failure, chronic kidney disease, as well as COPD and actually had a bronchoscopy performed yesterday.  Her husband noticed that she was increasingly short of breath around 1 AM today around 5 hours prior to arrival.  She normally is on 3 L nasal cannula and he checked her pulse ox at home and noted it was in the 40s.  He increased her oxygen and it came up to the 50s and he went back to bed.  He awoke to her gasping for air and called 911.  EMS did not administer any medication in route.    Past Medical History:  Diagnosis Date  . Atherosclerotic heart disease 06/12/2015  . CHF (congestive heart failure) ([place])   . Chronic congestive heart failure with left ventricular diastolic dysfunction ([place]) 04/12/2018  . CKD (chronic kidney disease) stage 2, GFR 60-89 ml/min 06/12/2015  . COPD (chronic obstructive pulmonary disease) ([place])   . Hypercalcemia 06/12/2015  . Hyperlipidemia 06/12/2015  . Hypertension 06/12/2015  . Hypothyroidism 06/12/2015  . Insomnia 06/12/2015  . Interstitial pneumonia ([place]) 04/12/2018  . 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 Shortness of breath 06/12/2015  . Solitary pulmonary nodule 06/12/2015    Patient Active Problem List   Diagnosis Date Noted  . Screening for breast cancer 07/19/2018  . HCAP (healthcare-associated  pneumonia) 06/03/2018  . Chronic congestive heart failure with left ventricular diastolic dysfunction ([place]) 04/12/2018  . Obstructive chronic bronchitis without exacerbation ([place]) 02/17/2018  . Major depression, chronic 02/17/2018  . 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 Pollen allergies 01/16/2018  . CHF (congestive heart failure) ([place]) 05/28/2017  . Syncope 02/06/2017  . Pancreatic mass 02/06/2017  . Iron deficiency anemia 05/01/2016  . Anemia in chronic renal disease 04/26/2016  . Insomnia 06/12/2015  . CKD (chronic kidney disease) stage 2, GFR 60-89 ml/min 06/12/2015  . Hypercalcemia 06/12/2015  . Hypothyroidism 06/12/2015  . 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 Hyperlipidemia 06/12/2015  . Hypertension 06/12/2015  . Atherosclerotic heart disease 06/12/2015  . Solitary pulmonary nodule 06/12/2015    Past Surgical History:  Procedure Laterality Date  . GALLBLADDER SURGERY  2012    Prior to Admission medications   Medication Sig Start Date End Date Taking? Authorizing Provider  albuterol (PROVENTIL) (2.5 MG/3ML) 0.083% nebulizer solution Take 2.5 mg by nebulization every 6 (six) hours as needed for wheezing or shortness of breath.    [provider]  amLODipine (NORVASC) 10 MG tablet Take 1 tablet (10 mg total) by mouth daily. 04/18/18   Lavera Guise, MD  aspirin EC 81 MG tablet Take 81 mg by mouth daily.  01/22/18 01/22/19  [provider]  atorvastatin (LIPITOR) 10 MG tablet Take 1 tablet (10 mg total) by mouth daily at 6 PM. 01/12/18   Ronnell Freshwater, NP  budesonide-formoterol (SYMBICORT) 160-4.5 MCG/ACT inhaler Inhale 2 puffs into the lungs 2 (two) times daily. 06/22/18   Orson Gear  J, NP  carvedilol (COREG) 6.25 MG tablet Take 1 tablet (6.25 mg total) by mouth 2 (two) times daily. 07/17/18   Ronnell Freshwater, NP  cefpodoxime (VANTIN) 200 MG tablet Take 1 tablet (200 mg total) by mouth 2 (two) times daily. 06/07/18   Demetrios Loll, MD  docusate sodium  (COLACE) 100 MG capsule Take 1 capsule (100 mg total) by mouth 2 (two) times daily as needed for mild constipation. 04/14/18   Nicholes Mango, MD  furosemide (LASIX) 20 MG tablet Take 1 tablet (20 mg total) by mouth daily. 06/16/18   Ronnell Freshwater, NP  guaiFENesin (ROBITUSSIN) 100 MG/5ML SOLN Take 5 mLs (100 mg total) by mouth every 4 (four) hours as needed for cough or to loosen phlegm. 06/07/18   Demetrios Loll, MD  levothyroxine (SYNTHROID, LEVOTHROID) 50 MCG tablet Take 1 tablet (50 mcg total) by mouth daily before breakfast. 02/20/18   Ronnell Freshwater, NP  loratadine (CLARITIN) 10 MG tablet Take 1 tablet (10 mg total) by mouth daily. Patient taking differently: Take 10 mg by mouth daily as needed.  02/06/18   Allyne Gee, MD  meclizine (ANTIVERT) 12.5 MG tablet Take 12.5 mg by mouth 3 (three) times daily as needed for dizziness.  06/02/16   [provider]  mirtazapine (REMERON) 15 MG tablet Take 0.5 tablets (7.5 mg total) by mouth at bedtime. 08/07/18   Ronnell Freshwater, NP  OXYGEN Inhale into the lungs. 2 litre    [provider]  pantoprazole (PROTONIX) 40 MG tablet Take 1 tablet (40 mg total) by mouth 2 (two) times daily. 05/22/18   Ronnell Freshwater, NP  sucralfate (CARAFATE) 1 g tablet Take 1 tablet (1 g total) by mouth 4 (four) times daily -  with meals and at bedtime. 01/27/18   Ronnell Freshwater, NP    Allergies Patient has no known allergies.  Family History  Problem Relation Age of Onset  . Cancer Mother   . Hypertension Father   . Diabetes Father   . Cancer Father     Social History Social History   Tobacco Use  . Smoking status: Former Smoker    Last attempt to quit: 2013    Years since quitting: 7.0  . Smokeless tobacco: Never Used  Substance Use Topics  . Alcohol use: No    Alcohol/week: 0.0 standard drinks  . Drug use: No    Review of Systems Level 5 exemption history limited by the patient's critical  condition  ____________________________________________   PHYSICAL EXAM:  VITAL SIGNS: ED Triage Vitals  Enc Vitals Group     BP      Pulse      Resp      Temp      Temp src      SpO2      Weight      Height      Head Circumference      Peak Flow      Pain Score      Pain Loc      Pain Edu?      Excl. in [place]?     Constitutional: Severe respiratory distress and gasping for air and lethargic Eyes: PERRL EOMI. midrange and brisk Head: Atraumatic. Nose: No congestion/rhinnorhea. Mouth/Throat: No trismus Neck: No meningismus.  Gurgling upper respiratory sounds and not protecting her airway Cardiovascular: Tachycardic rate, regular rhythm. Grossly normal heart sounds.  Good peripheral circulation. Respiratory: Severe respiratory distress using all accessory muscles with limited  air entry bilaterally.  Rhonchorous breath sounds throughout Gastrointestinal: Soft nontender Musculoskeletal: No lower extremity edema   Neurologic: Moves all 4 and feels all 4 Skin:  Skin is warm, dry and intact. No rash noted. Psychiatric: Obtunded and lethargic    ____________________________________________   DIFFERENTIAL includes but not limited to  Hypercapnia, COPD exacerbation, pneumonia, CHF exacerbation, influenza ____________________________________________   LABS (all labs ordered are listed, but only abnormal results are displayed)  Labs Reviewed  COMPREHENSIVE METABOLIC PANEL - Abnormal; Notable for the following components:      Result Value   Potassium 3.4 (*)    Glucose, Bld 110 (*)    Creatinine, Ser 1.31 (*)    Calcium 8.2 (*)    Albumin 2.9 (*)    GFR calc non Af Amer 42 (*)    GFR calc Af Amer 49 (*)    All other components within normal limits  BRAIN NATRIURETIC PEPTIDE - Abnormal; Notable for the following components:   B Natriuretic Peptide 431.0 (*)    All other components within normal limits  TROPONIN I - Abnormal; Notable for the following components:    Troponin I 0.04 (*)    All other components within normal limits  CBC WITH DIFFERENTIAL/PLATELET - Abnormal; Notable for the following components:   WBC 14.5 (*)    RBC 2.78 (*)    Hemoglobin 6.0 (*)    HCT 21.4 (*)    MCV 77.0 (*)    MCH 21.6 (*)    MCHC 28.0 (*)    RDW 18.5 (*)    Platelets 625 (*)    Neutro Abs 10.7 (*)    Monocytes Absolute 1.1 (*)    Abs Immature Granulocytes 0.10 (*)    All other components within normal limits  BLOOD GAS, VENOUS - Abnormal; Notable for the following components:   pCO2, Ven 42 (*)    pO2, Ven 85.0 (*)    All other components within normal limits  BLOOD GAS, ARTERIAL - Abnormal; Notable for the following components:   pCO2 arterial 49 (*)    pO2, Arterial 76 (*)    Bicarbonate 28.3 (*)    Acid-Base Excess 2.2 (*)    All other components within normal limits  CULTURE, BLOOD (ROUTINE X 2)  CULTURE, BLOOD (ROUTINE X 2)  INFLUENZA PANEL BY PCR (TYPE A & B)  LACTIC ACID, PLASMA  LACTIC ACID, PLASMA  PROCALCITONIN  PREPARE RBC (CROSSMATCH)  TYPE AND SCREEN    Lab work reviewed by me with a number of abnormalities.  Elevated white count is concerning for infection.  Her hemoglobin is 6 down from baseline near 8.  Elevated BNP and troponin likely secondary to fluid overload and not primary myocardial ischemia ABG with pH of 7.37 and PCO2 of 49 is actually just about where we want her to be.  We may speed up the vent just a bit __________________________________________  EKG  ED ECG REPORT I, Darel Hong, the attending physician, personally viewed and interpreted this ECG.  Date: 09/05/2018 EKG Time:  Rate: 117 Rhythm: Sinus tachycardia QRS Axis: normal Intervals: normal ST/T Wave abnormalities: Diffuse mild ST depression with no reciprocal elevation consistent with subendocardial ischemia from global process   ____________________________________________  RADIOLOGY  Chest x-ray reviewed by me shows endotracheal tube in good  position.  No consolidation is noted.  Severe lung disease also noted ____________________________________________   PROCEDURES  Procedure(s) performed: Yes  .Critical Care Performed by: Darel Hong, MD Authorized by: Darel Hong, MD  Critical care provider statement:    Critical care time (minutes):  35   Critical care time was exclusive of:  Separately billable procedures and treating other patients   Critical care was necessary to treat or prevent imminent or life-threatening deterioration of the following conditions:  Respiratory failure   Critical care was time spent personally by me on the following activities:  Development of treatment plan with patient or surrogate, discussions with consultants, evaluation of patient's response to treatment, examination of patient, obtaining history from patient or surrogate, ordering and performing treatments and interventions, ordering and review of laboratory studies, ordering and review of radiographic studies, pulse oximetry, re-evaluation of patient's condition and review of old charts Procedure Name: Intubation Date/Time: 09/05/2018 7:52 AM Performed by: Darel Hong, MD Pre-anesthesia Checklist: Patient identified, Patient being monitored, Emergency Drugs available, Timeout performed and Suction available Oxygen Delivery Method: Nasal cannula Preoxygenation: Pre-oxygenation with 100% oxygen Induction Type: Rapid sequence Ventilation: Mask ventilation without difficulty Laryngoscope Size: Mac and 4 Grade View: Grade I Tube size: 7.5 mm Number of attempts: 1 Placement Confirmation: ETT inserted through vocal cords under direct vision,  CO2 detector and Breath sounds checked- equal and bilateral Secured at: 22 cm Tube secured with: ETT holder Comments: Copious thick secretions in the oropharynx and on the cords    OG placement Date/Time: 09/05/2018 7:53 AM Performed by: Darel Hong, MD Authorized by: Darel Hong, MD   Consent: The procedure was performed in an emergent situation. Patient tolerance: Patient tolerated the procedure well with no immediate complications  ARTERIAL BLOOD GAS Date/Time: 09/05/2018 7:53 AM Performed by: Darel Hong, MD Authorized by: Darel Hong, MD   Consent:    Consent obtained:  Emergent situation Procedure details:    Location:  L radial   Allen's test performed: yes     Allen's test abnormal: no     Number of attempts:  2 Post-procedure details:    Dressing applied: no     Bleeding:  Manual pressure applied   Circulation, movement, and sensation:  Normal   Patient tolerance of procedure:  Tolerated well, no immediate complications    Critical Care performed: Yes  ____________________________________________   INITIAL IMPRESSION / ASSESSMENT AND PLAN / ED COURSE  Pertinent labs & imaging results that were available during my care of the patient were reviewed by me and considered in my medical decision making (see chart for details).   As part of my medical decision making, I reviewed the following data within the [facility] History obtained from family if available, nursing notes, old chart and ekg, as well as notes from prior ED visits.  The patient arrived in the emergency department in extremis in severe respiratory distress.  She was saturating in the 40s despite 6 L of nasal cannula and was profoundly obtunded intermittently passing out and intermittently able to speak but with gurgling respirations.  I considered BiPAP but given her mental status decision was made to intubate for the patient's own safety.  She was intubated with ketamine fentanyl and rocuronium without complication.  She is subsequently sedated with fentanyl and propofol infusions.  Given her history of severe COPD we will treat her with 3 duo nebs along with Solu-Medrol and magnesium and cover her with broad-spectrum antibiotics.  It certainly is possible that her COPD  exacerbation was worsened by her bronchoscopy yesterday.  Her hemoglobin came back at 6.0 and she has a baseline in the high sevens.  I will transfuse her 2 units  of blood as the patient needs all of the oxygen carrying capacity that she can handle at this point.  At 7:40 AM I discussed with the hospitalist Dr. Brett Albino who has graciously agreed to admit the patient to her service.  The patient is admitted in critical condition.      ____________________________________________   FINAL CLINICAL IMPRESSION(S) / ED DIAGNOSES  Final diagnoses:  Acute respiratory failure with hypoxia (HCC)  COPD with acute exacerbation (HCC)  Symptomatic anemia      NEW MEDICATIONS STARTED DURING THIS VISIT:  New Prescriptions   No medications on file     Note:  This document was prepared using Dragon voice recognition software and may include unintentional dictation errors.    Darel Hong, MD 09/05/18 [phone]

## 2018-09-05 NOTE — ED Notes (Signed)
Pt attempting to pull at tubbing. [place] MD states to administer 128mcg fentanyl bolus at this time

## 2018-09-05 NOTE — ED Notes (Signed)
ED TO INPATIENT HANDOFF REPORT  Name/Age/Gender Melissa Knox 68 y.o. female  Code Status    Code Status Orders  (From admission, onward)         Start     Ordered   09/05/18 0817  Full code  Continuous     09/05/18 0816        Code Status History    Date Active Date Inactive Code Status Order ID Comments User Context   06/03/2018 0955 06/08/2018 1838 Partial Code [id number]  Demetrios Loll, MD Inpatient   06/03/2018 0757 06/03/2018 0955 Full Code [id number]  Harrie Foreman, MD Inpatient   04/11/2018 1256 04/14/2018 1807 Full Code [id number]  Vaughan Basta, MD ED   05/29/2017 1024 05/30/2017 1819 DNR [id number]  Hillary Bow, MD Inpatient   05/28/2017 2323 05/29/2017 1024 Full Code [id number]  Quintella Baton, MD Inpatient   02/06/2017 1525 02/08/2017 1404 Full Code [id number]  Idelle Crouch, MD ED      Home/SNF/Other home  Chief Complaint Difficulty Breathing  Level of Care/Admitting Diagnosis ED Disposition    ED Disposition Condition [place]: [place] [[id number]]  Level of Care: ICU [6]  Diagnosis: Acute on chronic respiratory failure with hypoxia [facility]) [[id number]]  Admitting Physician: Loletha Grayer [[id number]]  Attending Physician: Loletha Grayer [phone]  Estimated length of stay: past midnight tomorrow  Certification:: I certify this patient will need inpatient services for at least 2 midnights  PT Class (Do Not Modify): Inpatient [101]  PT Acc Code (Do Not Modify): Private [1]       Medical History Past Medical History:  Diagnosis Date  . Atherosclerotic heart disease 06/12/2015  . CHF (congestive heart failure) ([place])   . Chronic congestive heart failure with left ventricular diastolic dysfunction ([place]) 04/12/2018  . CKD (chronic kidney disease) stage 2, GFR 60-89 ml/min 06/12/2015  . COPD (chronic obstructive pulmonary disease) ([place])   . Hypercalcemia 06/12/2015  . Hyperlipidemia 06/12/2015  .  Hypertension 06/12/2015  . Hypothyroidism 06/12/2015  . Insomnia 06/12/2015  . Interstitial pneumonia ([place]) 04/12/2018  . 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 Shortness of breath 06/12/2015  . Solitary pulmonary nodule 06/12/2015    Allergies No Known Allergies  IV Location/Drains/Wounds Patient Lines/Drains/Airways Status   Active Line/Drains/Airways    Name:   Placement date:   Placement time:   Site:   Days:   Peripheral IV 09/05/18 Right Forearm   09/05/18    0639    Forearm   less than 1   Peripheral IV 09/05/18 Right;Upper Arm   09/05/18    0700    Arm   less than 1   Peripheral IV 09/05/18 Left Wrist   09/05/18    0643    Wrist   less than 1   NG/OG Tube Orogastric 18 Fr. Center mouth Xray;Aucultation Documented cm marking at nare/ corner of mouth 80 cm   09/05/18    [place] mouth   less than 1   Urethral Catheter Devorah Givhan, RN  Non-latex;Straight-tip;Latex 14 Fr.   09/05/18    0726    Non-latex;Straight-tip;Latex   less than 1   Airway 7 mm   09/05/18    0640     less than 1          Labs/Imaging Results for orders placed or performed during the hospital encounter of 09/05/18 (from the past 48 hour(s))  Comprehensive metabolic panel     Status: Abnormal  Collection Time: 09/05/18  6:48 AM  Result Value Ref Range   Sodium 141 135 - 145 mmol/L   Potassium 3.4 (L) 3.5 - 5.1 mmol/L   Chloride 106 98 - 111 mmol/L   CO2 26 22 - 32 mmol/L   Glucose, Bld 110 (H) 70 - 99 mg/dL   BUN 13 8 - 23 mg/dL   Creatinine, Ser 1.31 (H) 0.44 - 1.00 mg/dL   Calcium 8.2 (L) 8.9 - 10.3 mg/dL   Total Protein 7.8 6.5 - 8.1 g/dL   Albumin 2.9 (L) 3.5 - 5.0 g/dL   AST 17 15 - 41 U/L   ALT 9 0 - 44 U/L   Alkaline Phosphatase 66 38 - 126 U/L   Total Bilirubin 0.6 0.3 - 1.2 mg/dL   GFR calc non Af Amer 42 (L) >60 mL/min   GFR calc Af Amer 49 (L) >60 mL/min   Anion gap 9 5 - 15    Comment: Performed at [facility], [name] [place], [place] [address]  Brain  natriuretic peptide     Status: Abnormal   Collection Time: 09/05/18  6:48 AM  Result Value Ref Range   B Natriuretic Peptide 431.0 (H) 0.0 - 100.0 pg/mL    Comment: Performed at [facility], [address] [place], [place] [address]  Troponin I - Once     Status: Abnormal   Collection Time: 09/05/18  6:48 AM  Result Value Ref Range   Troponin I 0.04 (HH) <0.03 ng/mL    Comment: CRITICAL RESULT CALLED TO, READ BACK BY AND VERIFIED WITH  ALLIE BOWMAN AT 0757 09/05/18 SDR Performed at [facility], [address] [place], [place] [address]   CBC with Differential     Status: Abnormal   Collection Time: 09/05/18  6:48 AM  Result Value Ref Range   WBC 14.5 (H) 4.0 - 10.5 K/uL   RBC 2.78 (L) 3.87 - 5.11 MIL/uL   Hemoglobin 6.0 (L) 12.0 - 15.0 g/dL    Comment: Reticulocyte Hemoglobin testing may be clinically indicated, consider ordering this additional test [id number]    HCT 21.4 (L) 36.0 - 46.0 %   MCV 77.0 (L) 80.0 - 100.0 fL   MCH 21.6 (L) 26.0 - 34.0 pg   MCHC 28.0 (L) 30.0 - 36.0 g/dL   RDW 18.5 (H) 11.5 - 15.5 %   Platelets 625 (H) 150 - 400 K/uL   nRBC 0.1 0.0 - 0.2 %   Neutrophils Relative % 73 %   Neutro Abs 10.7 (H) 1.7 - 7.7 K/uL   Lymphocytes Relative 16 %   Lymphs Abs 2.3 0.7 - 4.0 K/uL   Monocytes Relative 8 %   Monocytes Absolute 1.1 (H) 0.1 - 1.0 K/uL   Eosinophils Relative 1 %   Eosinophils Absolute 0.1 0.0 - 0.5 K/uL   Basophils Relative 1 %   Basophils Absolute 0.1 0.0 - 0.1 K/uL   Immature Granulocytes 1 %   Abs Immature Granulocytes 0.10 (H) 0.00 - 0.07 K/uL    Comment: Performed at [facility], [address] [place], [place] [address]  Blood gas, venous     Status: Abnormal   Collection Time: 09/05/18  6:48 AM  Result Value Ref Range   pH, Ven 7.37 7.250 - 7.430   pCO2, Ven 42 (L) 44.0 - 60.0 mmHg   pO2, Ven 85.0 (H) 32.0 - 45.0 mmHg   Bicarbonate 24.3 20.0 - 28.0 mmol/L   Acid-base deficit 1.1  0.0 - 2.0 mmol/L   O2  Saturation 96.1 %   Patient temperature 37.0    Collection site VEIN    Sample type VENIPUNCTURE     Comment: Performed at [facility], [address] [place], [place] [address]  Influenza panel by PCR (type A & B)     Status: None   Collection Time: 09/05/18  6:48 AM  Result Value Ref Range   Influenza A By PCR NEGATIVE NEGATIVE   Influenza B By PCR NEGATIVE NEGATIVE    Comment: (NOTE) The Xpert Xpress Flu assay is intended as an aid in the diagnosis of  influenza and should not be used as a sole basis for treatment.  This  assay is FDA approved for nasopharyngeal swab specimens only. Nasal  washings and aspirates are unacceptable for Xpert Xpress Flu testing. Performed at [facility], [address] [place], [place] [address]   Lactic acid, plasma     Status: Abnormal   Collection Time: 09/05/18  6:48 AM  Result Value Ref Range   Lactic Acid, Venous 3.4 (HH) 0.5 - 1.9 mmol/L    Comment: CRITICAL RESULT CALLED TO, READ BACK BY AND VERIFIED WITH  Samul Dada AT 5573 09/05/18 SDR Performed at [facility], [address] [place],  [address]   Procalcitonin     Status: None   Collection Time: 09/05/18  6:48 AM  Result Value Ref Range   Procalcitonin 0.23 ng/mL    Comment:        Interpretation: PCT (Procalcitonin) <= 0.5 ng/mL: Systemic infection (sepsis) is not likely. Local bacterial infection is possible. (NOTE)       Sepsis PCT Algorithm           Lower Respiratory Tract                                      Infection PCT Algorithm    ----------------------------     ----------------------------         PCT < 0.25 ng/mL                PCT < 0.10 ng/mL         Strongly encourage             Strongly discourage   discontinuation of antibiotics    initiation of antibiotics    ----------------------------     -----------------------------       PCT 0.25 - 0.50 ng/mL            PCT 0.10 - 0.25 ng/mL               OR       >80% decrease  in PCT            Discourage initiation of                                            antibiotics      Encourage discontinuation           of antibiotics    ----------------------------     -----------------------------         PCT >= 0.50 ng/mL              PCT 0.26 - 0.50 ng/mL  AND        <80% decrease in PCT             Encourage initiation of                                             antibiotics       Encourage continuation           of antibiotics    ----------------------------     -----------------------------        PCT >= 0.50 ng/mL                  PCT > 0.50 ng/mL               AND         increase in PCT                  Strongly encourage                                      initiation of antibiotics    Strongly encourage escalation           of antibiotics                                     -----------------------------                                           PCT <= 0.25 ng/mL                                                 OR                                        > 80% decrease in PCT                                     Discontinue / Do not initiate                                             antibiotics Performed at [facility], [address] [place], [place] [address]   Ferritin     Status: None   Collection Time: 09/05/18  6:48 AM  Result Value Ref Range   Ferritin 18 11 - 307 ng/mL    Comment: Performed at [facility], [address] [place], [place] [address]  Blood gas, arterial     Status: Abnormal (Preliminary result)   Collection Time: 09/05/18  7:13 AM  Result Value Ref Range   FIO2 0.70    Delivery systems VENTILATOR    Mode ASSIST CONTROL  VT 500 mL   LHR 16 resp/min   Peep/cpap 5.0 cm H20   pH, Arterial 7.37 7.350 - 7.450   pCO2 arterial 49 (H) 32.0 - 48.0 mmHg   pO2, Arterial 76 (L) 83.0 - 108.0 mmHg   Bicarbonate 28.3 (H) 20.0 - 28.0 mmol/L   Acid-Base Excess 2.2 (H) 0.0 - 2.0 mmol/L   O2  Saturation 94.6 %   Patient temperature 37.0    Collection site PENDING    Sample type RIGHT RADIAL    Allens test (pass/fail) PASS PASS    Comment: Performed at [facility], [address] [place], [place] [address]  Prepare RBC     Status: None   Collection Time: 09/05/18  7:21 AM  Result Value Ref Range   Order Confirmation      ORDER PROCESSED BY BLOOD BANK Performed at [facility], [address] [place], [place] [address]   Type and screen Ordered by PROVIDER DEFAULT     Status: None (Preliminary result)   Collection Time: 09/05/18  8:39 AM  Result Value Ref Range   ABO/RH(D) A POS    Antibody Screen NEG    Sample Expiration 09/08/2018    Unit Number [id number]    Blood Component Type RED CELLS,LR    Unit division 00    Status of Unit ISSUED    Transfusion Status OK TO TRANSFUSE    Crossmatch Result      Compatible Performed at [facility], [address] [place], [place] [address]    Unit Number [id number]    Blood Component Type RED CELLS,LR    Unit division 00    Status of Unit ALLOCATED    Transfusion Status OK TO TRANSFUSE    Crossmatch Result Compatible    Dg Chest Port 1 View  Result Date: 09/05/2018 CLINICAL DATA:  Short of breath COPD.  Bronchoscopy 24 hours ago. EXAM: PORTABLE CHEST 1 VIEW COMPARISON:  06/03/2018 FINDINGS: Endotracheal tube in good position.  Gastric tube in the stomach. Negative for pneumothorax post bronchoscopy Severe chronic lung disease. Progression of bibasilar airspace disease. Small bilateral pleural effusions, with progression on the left. IMPRESSION: No pneumothorax post bronchoscopy Endotracheal tube in good position Severe chronic airspace disease bilaterally. Progression of bibasilar airspace disease and left effusion. Electronically Signed   By: Franchot Gallo M.D.   On: 09/05/2018 07:25    Pending Labs Unresulted Labs (From admission, onward)    Start     Ordered   09/06/18 9326  Basic  metabolic panel  Tomorrow morning,   STAT     09/05/18 0816   09/06/18 0500  CBC  Tomorrow morning,   STAT     09/05/18 0816   09/05/18 0815  Expectorated sputum assessment w rflx to resp cult  Once,   STAT    Question:  Patient immune status  Answer:  Normal   09/05/18 0814   09/05/18 0628  Blood Culture (routine x 2)  BLOOD CULTURE X 2,   STAT     09/05/18 0627   09/05/18 0623  Lactic acid, plasma  Now then every 2 hours,   STAT     09/05/18 0622          Vitals/Pain Today's Vitals   09/05/18 1325 09/05/18 1330 09/05/18 1335 09/05/18 1340  BP: (!) 107/56 (!) 107/51 (!) 110/54 (!) 105/53  Pulse: 81 82 86 82  Resp: 15 15 13 13   Temp: 98.1 F (36.7 C) 98.1 F (36.7  C) 98.1 F (36.7 C) 98.1 F (36.7 C)  TempSrc:      SpO2: 100% 100% 100% 100%  Weight:      PainSc:        Isolation Precautions No active isolations  Medications Medications  propofol (DIPRIVAN) 1000 MG/100ML infusion (40 mcg/kg/min Intravenous Rate/Dose Change 09/05/18 1330)  fentaNYL 2550mcg in NS 259mL (15mcg/ml) infusion-PREMIX (100 mcg/hr Intravenous Bolus 09/05/18 0909)  ketamine (KETALAR) injection 150 mg (150 mg Intravenous Not Given 09/05/18 0638)  fentaNYL (SUBLIMAZE) injection 200 mcg (200 mcg Intravenous Not Given 09/05/18 0638)  rocuronium (ZEMURON) injection 80 mg (80 mg Intravenous Not Given 09/05/18 0813)  norepinephrine (LEVOPHED) 4 mg in dextrose 5 % 250 mL (0.016 mg/mL) infusion (5 mcg/min Intravenous Rate/Dose Change 09/05/18 1316)  budesonide (PULMICORT) nebulizer solution 0.5 mg (has no administration in time range)  ipratropium-albuterol (DUONEB) 0.5-2.5 (3) MG/3ML nebulizer solution 3 mL (3 mLs Nebulization Given 09/05/18 1050)  methylPREDNISolone sodium succinate (SOLU-MEDROL) 125 mg/2 mL injection 60 mg (60 mg Intravenous Given 09/05/18 1149)  acetaminophen (TYLENOL) tablet 650 mg (has no administration in time range)    Or  acetaminophen (TYLENOL) suppository 650 mg (has no administration in time  range)  ondansetron (ZOFRAN) tablet 4 mg (has no administration in time range)    Or  ondansetron (ZOFRAN) injection 4 mg (has no administration in time range)  levothyroxine (SYNTHROID, LEVOTHROID) tablet 50 mcg (has no administration in time range)  pantoprazole (PROTONIX) injection 40 mg (40 mg Intravenous Given 09/05/18 1148)  ceFEPIme (MAXIPIME) 2 g in sodium chloride 0.9 % 100 mL IVPB (has no administration in time range)  sodium chloride 0.9 % bolus 1,000 mL (0 mLs Intravenous Stopped 09/05/18 0821)  magnesium sulfate IVPB 2 g 50 mL (0 g Intravenous Stopped 09/05/18 0826)  ipratropium-albuterol (DUONEB) 0.5-2.5 (3) MG/3ML nebulizer solution 3 mL (3 mLs Nebulization Given 09/05/18 0728)  ipratropium-albuterol (DUONEB) 0.5-2.5 (3) MG/3ML nebulizer solution 3 mL (3 mLs Nebulization Given 09/05/18 0728)  ipratropium-albuterol (DUONEB) 0.5-2.5 (3) MG/3ML nebulizer solution 3 mL (3 mLs Nebulization Given 09/05/18 0728)  methylPREDNISolone sodium succinate (SOLU-MEDROL) 125 mg/2 mL injection 125 mg (125 mg Intravenous Given 09/05/18 0805)  azithromycin (ZITHROMAX) 500 mg in sodium chloride 0.9 % 250 mL IVPB ( Intravenous Stopped 09/05/18 0928)  0.9 %  sodium chloride infusion (10 mL/hr Intravenous New Bag/Given 09/05/18 1300)  ceFEPIme (MAXIPIME) 2 g in sodium chloride 0.9 % 100 mL IVPB (0 g Intravenous Stopped 09/05/18 0842)  propofol (DIPRIVAN) 10 mg/mL bolus/IV push 60 mg (60 mg Intravenous Given 09/05/18 0654)  vecuronium (NORCURON) injection 6.5 mg (6.5 mg Intravenous Given 09/05/18 1010)

## 2018-09-05 NOTE — ED Notes (Addendum)
30 mg propofol, and 100 mcg fentanyl administered at this time due to pt attempting to pull at tubbing per stafford MD verbal orders

## 2018-09-05 NOTE — ED Notes (Signed)
Only able to collect 1 blood culture a this time. Pt in critical condition.

## 2018-09-05 NOTE — ED Notes (Signed)
529mL NS bolus complete at this time

## 2018-09-05 NOTE — ED Notes (Signed)
Blood bank states previous type and screen hemolyzed. RN requested that lab recollect type and screen.

## 2018-09-05 NOTE — H&P (Signed)
Melissa Knox at [place] NAME: Melissa Knox     MR#:  [id number]  DATE OF BIRTH:  Mar 16, 1951  DATE OF ADMISSION:  09/05/2018  PRIMARY CARE PHYSICIAN: Ronnell Freshwater, NP   REQUESTING/REFERRING PHYSICIAN: Dr Darel Hong  CHIEF COMPLAINT:   Chief Complaint  Patient presents with  . Shortness of Breath    HISTORY OF PRESENT ILLNESS:  Melissa Knox  is a 68 y.o. female with a known history of COPD on chronic oxygen.  For the past 6 months she has been having problems with oxygen levels dropping down.  They were trying to figure out what is going on with her lungs.  She had a bronchoscopy yesterday.  The patient's husband was trying to get her pulse ox up all evening.  Last night she was talking out of her head with moaning and breathing hard.  Her pulse ox was in the 40s and he tried to bump her oxygen up.  Highest pulse ox was in the 50s.  This morning could not wake her up.  Of note, the patient does vape.  PAST MEDICAL HISTORY:   Past Medical History:  Diagnosis Date  . Atherosclerotic heart disease 06/12/2015  . CHF (congestive heart failure) ([place])   . Chronic congestive heart failure with left ventricular diastolic dysfunction ([place]) 04/12/2018  . CKD (chronic kidney disease) stage 2, GFR 60-89 ml/min 06/12/2015  . COPD (chronic obstructive pulmonary disease) ([place])   . Hypercalcemia 06/12/2015  . Hyperlipidemia 06/12/2015  . Hypertension 06/12/2015  . Hypothyroidism 06/12/2015  . Insomnia 06/12/2015  . Interstitial pneumonia ([place]) 04/12/2018  . 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 Shortness of breath 06/12/2015  . Solitary pulmonary nodule 06/12/2015    PAST SURGICAL HISTORY:   Past Surgical History:  Procedure Laterality Date  . GALLBLADDER SURGERY  2012    SOCIAL HISTORY:   Social History   Tobacco Use  . Smoking status: Former Smoker    Last attempt to quit: 2013    Years since quitting: 7.0  .  Smokeless tobacco: Never Used  Substance Use Topics  . Alcohol use: No    Alcohol/week: 0.0 standard drinks    FAMILY HISTORY:   Family History  Problem Relation Age of Onset  . Cancer Mother   . Hypertension Father   . Diabetes Father   . Cancer Father     DRUG ALLERGIES:  No Known Allergies  REVIEW OF SYSTEMS:  Patient intubated and unable to provide review of systems.  MEDICATIONS AT HOME:   Prior to Admission medications   Medication Sig Start Date End Date Taking? Authorizing Provider  albuterol (PROVENTIL) (2.5 MG/3ML) 0.083% nebulizer solution Take 2.5 mg by nebulization every 6 (six) hours as needed for wheezing or shortness of breath.    [provider]  amLODipine (NORVASC) 10 MG tablet Take 1 tablet (10 mg total) by mouth daily. 04/18/18   Lavera Guise, MD  aspirin EC 81 MG tablet Take 81 mg by mouth daily.  01/22/18 01/22/19  [provider]  atorvastatin (LIPITOR) 10 MG tablet Take 1 tablet (10 mg total) by mouth daily at 6 PM. 01/12/18   Ronnell Freshwater, NP  budesonide-formoterol (SYMBICORT) 160-4.5 MCG/ACT inhaler Inhale 2 puffs into the lungs 2 (two) times daily. 06/22/18   Kendell Bane, NP  carvedilol (COREG) 6.25 MG tablet Take 1 tablet (6.25 mg total) by mouth 2 (two) times daily. 07/17/18   Ronnell Freshwater, NP  cefpodoxime (VANTIN) 200 MG tablet Take 1 tablet (200 mg total) by mouth 2 (two) times daily. 06/07/18   Demetrios Loll, MD  docusate sodium (COLACE) 100 MG capsule Take 1 capsule (100 mg total) by mouth 2 (two) times daily as needed for mild constipation. 04/14/18   Nicholes Mango, MD  furosemide (LASIX) 20 MG tablet Take 1 tablet (20 mg total) by mouth daily. 06/16/18   Ronnell Freshwater, NP  guaiFENesin (ROBITUSSIN) 100 MG/5ML SOLN Take 5 mLs (100 mg total) by mouth every 4 (four) hours as needed for cough or to loosen phlegm. 06/07/18   Demetrios Loll, MD  levothyroxine (SYNTHROID, LEVOTHROID) 50 MCG tablet Take 1 tablet (50 mcg total) by  mouth daily before breakfast. 02/20/18   Ronnell Freshwater, NP  loratadine (CLARITIN) 10 MG tablet Take 1 tablet (10 mg total) by mouth daily. Patient taking differently: Take 10 mg by mouth daily as needed.  02/06/18   Allyne Gee, MD  meclizine (ANTIVERT) 12.5 MG tablet Take 12.5 mg by mouth 3 (three) times daily as needed for dizziness.  06/02/16   [provider]  mirtazapine (REMERON) 15 MG tablet Take 0.5 tablets (7.5 mg total) by mouth at bedtime. 08/07/18   Ronnell Freshwater, NP  OXYGEN Inhale into the lungs. 2 litre    [provider]  pantoprazole (PROTONIX) 40 MG tablet Take 1 tablet (40 mg total) by mouth 2 (two) times daily. 05/22/18   Ronnell Freshwater, NP  sucralfate (CARAFATE) 1 g tablet Take 1 tablet (1 g total) by mouth 4 (four) times daily -  with meals and at bedtime. 01/27/18   Ronnell Freshwater, NP      VITAL SIGNS:  Blood pressure (!) 125/51, pulse 92, temperature 99.1 F (37.3 C), resp. rate 16, weight 64.9 kg, SpO2 96 %.  PHYSICAL EXAMINATION:  GENERAL:  68 y.o.-year-old patient lying in the bed intubated and sedated EYES: Pupils are pinpoint. No scleral icterus.  HEENT: Head atraumatic, normocephalic.  Unable to look into mouth.  Nasopharynx clear.  NECK:  Supple, no jugular venous distention. No thyroid enlargement, no tenderness.  LUNGS: Decreased breath sounds bilaterally, no wheezing, positive rhonchi bilateral lung fields.. No use of accessory muscles of respiration.  CARDIOVASCULAR: S1, S2 tachycardia. No murmurs, rubs, or gallops.  ABDOMEN: Soft, nontender, nondistended. Bowel sounds present. No organomegaly or mass.  EXTREMITIES: No pedal edema, cyanosis, or clubbing.  NEUROLOGIC: Patient is sedated but she did move her toes PSYCHIATRIC: The patient is intubated and sedated.  SKIN: No rash, lesion, or ulcer.   LABORATORY PANEL:   CBC Recent Labs  Lab 09/05/18 0648  WBC 14.5*  HGB 6.0*  HCT 21.4*  PLT 625*    ------------------------------------------------------------------------------------------------------------------  Chemistries  Recent Labs  Lab 09/05/18 0648  NA 141  K 3.4*  CL 106  CO2 26  GLUCOSE 110*  BUN 13  CREATININE 1.31*  CALCIUM 8.2*  AST 17  ALT 9  ALKPHOS 66  BILITOT 0.6   ------------------------------------------------------------------------------------------------------------------  Cardiac Enzymes Recent Labs  Lab 09/05/18 0648  TROPONINI 0.04*   ------------------------------------------------------------------------------------------------------------------  RADIOLOGY:  Dg Chest Port 1 View  Result Date: 09/05/2018 CLINICAL DATA:  Short of breath COPD.  Bronchoscopy 24 hours ago. EXAM: PORTABLE CHEST 1 VIEW COMPARISON:  06/03/2018 FINDINGS: Endotracheal tube in good position.  Gastric tube in the stomach. Negative for pneumothorax post bronchoscopy Severe chronic lung disease. Progression of bibasilar airspace disease. Small bilateral pleural effusions, with progression on the left. IMPRESSION:  No pneumothorax post bronchoscopy Endotracheal tube in good position Severe chronic airspace disease bilaterally. Progression of bibasilar airspace disease and left effusion. Electronically Signed   By: Franchot Gallo M.D.   On: 09/05/2018 07:25    EKG:   Sinus tachycardia 117 bpm  IMPRESSION AND PLAN:   1.  Acute hypoxic respiratory failure. Patient intubated and currently on 60% FiO2.  Sedation with fentanyl and propofol.  Follow-up bronchoscopy results from yesterday. 2.  Pneumonia and COPD exacerbation.  Empiric nebulizer treatments with budesonide and DuoNeb.  Empiric antibiotics with cefepime and Zithromax.  Empiric steroids.  Advised husband that she must stop vaping.  This could be a vaping lung injury. 3.  Acute on chronic anemia.  Add on ferritin.  Will switch patient's Protonix to IV since the patient does have a history of bleeding ulcer.  ER  physician already ordered 2 units of blood.  Benefits and risks of transfusion explained to the patient's husband. 4.  History of CHF.  Likely will end up needing a dose of Lasix later today.  Currently x-ray looks like infection. 5.  Hypothyroidism unspecified on levothyroxine 6.  Essential hypertension hold antihypertensives 7.  Hyperlipidemia unspecified hold cholesterol medication    All the records are reviewed and case discussed with ED provider. Management plans discussed with the patient, family and they are in agreement.  CODE STATUS: Full code  TOTAL TIME TAKING CARE OF THIS PATIENT: 55 minutes, patient is critically ill and be admitted to the critical care unit.  Case discussed with critical care specialist..    Loletha Grayer M.D on 09/05/2018 at 8:18 AM  Between 7am to 6pm - Pager - [phone]  After 6pm call admission pager [phone]  [facility]  [phone]  CC: Primary care physician; Ronnell Freshwater, NP

## 2018-09-06 ENCOUNTER — Inpatient Hospital Stay: Payer: Medicare Other

## 2018-09-06 LAB — CBC
HCT: 29.8 % — ABNORMAL LOW (ref 36.0–46.0)
Hemoglobin: 9 g/dL — ABNORMAL LOW (ref 12.0–15.0)
MCH: 23.9 pg — AB (ref 26.0–34.0)
MCHC: 30.2 g/dL (ref 30.0–36.0)
MCV: 79.3 fL — ABNORMAL LOW (ref 80.0–100.0)
Platelets: 448 10*3/uL — ABNORMAL HIGH (ref 150–400)
RBC: 3.76 MIL/uL — ABNORMAL LOW (ref 3.87–5.11)
RDW: 17.2 % — ABNORMAL HIGH (ref 11.5–15.5)
WBC: 8.2 10*3/uL (ref 4.0–10.5)
nRBC: 0 % (ref 0.0–0.2)

## 2018-09-06 LAB — TYPE AND SCREEN
ABO/RH(D): A POS
Antibody Screen: NEGATIVE
UNIT DIVISION: 0
Unit division: 0

## 2018-09-06 LAB — BLOOD GAS, ARTERIAL
ACID-BASE EXCESS: 2.2 mmol/L — AB (ref 0.0–2.0)
Acid-base deficit: 2.8 mmol/L — ABNORMAL HIGH (ref 0.0–2.0)
BICARBONATE: 28.3 mmol/L — AB (ref 20.0–28.0)
Bicarbonate: 23.2 mmol/L (ref 20.0–28.0)
FIO2: 0.7
FIO2: 0.35
MECHVT: 500 mL
MECHVT: 450 mL
O2 Saturation: 94.6 %
O2 Saturation: 91.1 %
PEEP: 5 cmH2O
PEEP: 5 cmH2O
PH ART: 7.37 (ref 7.350–7.450)
Patient temperature: 37
Patient temperature: 37
RATE: 16 resp/min
RATE: 16 resp/min
pCO2 arterial: 49 mmHg — ABNORMAL HIGH (ref 32.0–48.0)
pCO2 arterial: 44 mmHg (ref 32.0–48.0)
pH, Arterial: 7.33 — ABNORMAL LOW (ref 7.350–7.450)
pO2, Arterial: 76 mmHg — ABNORMAL LOW (ref 83.0–108.0)
pO2, Arterial: 66 mmHg — ABNORMAL LOW (ref 83.0–108.0)

## 2018-09-06 LAB — GLUCOSE, CAPILLARY
Glucose-Capillary: 161 mg/dL — ABNORMAL HIGH (ref 70–99)
Glucose-Capillary: 132 mg/dL — ABNORMAL HIGH (ref 70–99)
Glucose-Capillary: 137 mg/dL — ABNORMAL HIGH (ref 70–99)

## 2018-09-06 LAB — BASIC METABOLIC PANEL
Anion gap: 9 (ref 5–15)
BUN: 17 mg/dL (ref 8–23)
CO2: 21 mmol/L — ABNORMAL LOW (ref 22–32)
Calcium: 8.1 mg/dL — ABNORMAL LOW (ref 8.9–10.3)
Chloride: 111 mmol/L (ref 98–111)
Creatinine, Ser: 1.31 mg/dL — ABNORMAL HIGH (ref 0.44–1.00)
GFR calc Af Amer: 49 mL/min — ABNORMAL LOW (ref >60)
GFR calc non Af Amer: 42 mL/min — ABNORMAL LOW (ref >60)
Glucose, Bld: 139 mg/dL — ABNORMAL HIGH (ref 70–99)
Potassium: 3.7 mmol/L (ref 3.5–5.1)
SODIUM: 141 mmol/L (ref 135–145)

## 2018-09-06 LAB — BPAM RBC
Blood Product Expiration Date: 202001242359
Blood Product Expiration Date: 202001242359
ISSUE DATE / TIME: 202001071243
ISSUE DATE / TIME: 202001071625
UNIT TYPE AND RH: 6200
Unit Type and Rh: 6200

## 2018-09-06 MED ORDER — VITAL AF 1.2 CAL PO LIQD
1000.0000 mL | ORAL | Status: DC
  Administered 2018-09-06: 1000 mL

## 2018-09-06 MED ORDER — VITAL HIGH PROTEIN PO LIQD: 1000.0000 mL | ORAL | Status: DC

## 2018-09-06 MED ORDER — DEXMEDETOMIDINE HCL IN NACL 400 MCG/100ML IV SOLN
0.4000 ug/kg/h | INTRAVENOUS | Status: DC
  Administered 2018-09-06: 0.8 ug/kg/h via INTRAVENOUS
  Administered 2018-09-06 – 2018-09-07 (×3): 1 ug/kg/h via INTRAVENOUS
  Filled 2018-09-06 (×5): qty 100

## 2018-09-06 NOTE — Progress Notes (Signed)
Initial Nutrition Assessment  DOCUMENTATION CODES:   Non-severe (moderate) malnutrition in context of chronic illness  INTERVENTION:  Initiate Vital AF 1.2 at 20 mL/hr and advance by 15 mL/hr every 8 hours to goal rate of 50 mL/hr (1200 mL goal daily volume). Provides 1440 kcal, 90 grams of protein, 972 mL H2O daily.  Provide minimum free water flush of 30 mL Q4hrs to maintain tube patency.   Goal TF regimen meets 100% RDIs for vitamins/minerals.  Monitor magnesium, potassium, and phosphorus daily for at least 3 days, MD to replete as needed, as pt is at risk for refeeding syndrome given moderate malnutrition.  NUTRITION DIAGNOSIS:   Moderate Malnutrition related to chronic illness(COPD, CHF, CKD) as evidenced by moderate fat depletion, mild-moderate muscle depletion.  GOAL:   Provide needs based on ASPEN/SCCM guidelines  MONITOR:   Vent status, Labs, Weight trends, TF tolerance, I & O's  REASON FOR ASSESSMENT:   Ventilator    ASSESSMENT:   68 year old female with PMHx of CKD stage II, hypothyroidism, HLD, HTN, CHF, COPD admitted with severe COPD exacerbation requiring intubation on 1/7.   Patient intubated and sedated. On PRVC mode with FiO2 35% and PEEP 5 cmH2O. Abdomen soft. Last BM unknown/PTA. Skin is intact. Patient is known to this RD from an assessment on 02/07/2017. At that time she met time for moderate chronic malnutrition. 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 Her UBW was 148 lbs. At that time she was 136.5 lbs. On NFPE in 01/2017 RD found Bitot's spots, which were concerning for vitamin A deficiency. Unable to assess eyes today, but can try to assess later in admission when RN is assessing pupils or possibly after extubation. Patient is currently 68.2 kg (150.35 lbs).   Enteral Access: 18 Fr. OGT placed 1/7; coiled in stomach per chest x-ray 1/8; 78 cm at corner of mouth  MAP: 63-71 mmHg  Patient is currently intubated on ventilator support Ve:  10.5 L/min Temp (24hrs), Avg:96.6 F (35.9 C), Min:95 F (35 C), Max:98.1 F (36.7 C)  Propofol: 19.5 mL/hr (515 kcal daily)  Medications reviewed and include: levothyroxine, Solu-Medrol 60 mg Q12hrs IV, pantoprazole, cefepime, fentanyl gtt, norepinephrine gtt at 10 mcg/min, propofol gtt.  Labs reviewed: CBG 173, CO2 21, Creatinine 1.31.  I/O: 925 mL UOP yesterday (0.6 mL/kg/hr)  Discussed with RN and on rounds. Plan is to initiate tube feeds today. Patient will be coming off propofol gtt and starting on Precedex gtt.  NUTRITION - FOCUSED PHYSICAL EXAM:    Most Recent Value  Orbital Region  Moderate depletion  Upper Arm Region  Moderate depletion  Thoracic and Lumbar Region  Mild depletion  Buccal Region  Unable to assess  Temple Region  Moderate depletion  Clavicle Bone Region  Mild depletion  Clavicle and Acromion Bone Region  Moderate depletion  Scapular Bone Region  Unable to assess  Dorsal Hand  Moderate depletion  Patellar Region  Mild depletion  Anterior Thigh Region  Mild depletion  Posterior Calf Region  Mild depletion  Edema (RD Assessment)  None  Hair  Reviewed  Eyes  Unable to assess  Mouth  Unable to assess  Skin  Reviewed  Nails  Reviewed     Diet Order:   Diet Order            Diet NPO time specified  Diet effective now             EDUCATION NEEDS:   Not appropriate  for education at this time  Skin:  Skin Assessment: Reviewed RN Assessment  Last BM:  Unknown/PTA  Height:   Ht Readings from Last 1 Encounters:  09/05/18 '5\' 4"'$  (1.626 m)   Weight:   Wt Readings from Last 1 Encounters:  09/05/18 68.2 kg   Ideal Body Weight:  54.5 kg  BMI:  Body mass index is 25.81 kg/m.  Estimated Nutritional Needs:   Kcal:  1401 (PSU 2003b w/ MSJ 1207, Ve 10.5, Tmax 36.7)  Protein:  82-102 grams (1.2-1.5 grams/kg)  Fluid:  1.7 L/day (25 mL/kg)  Willey Blade, MS, RD, LDN Office: [phone] Pager: [phone] After Hours/Weekend Pager:  [phone]

## 2018-09-06 NOTE — Progress Notes (Signed)
Pharmacy Antibiotic Note  Melissa Knox is a 68 y.o. female admitted on 09/05/2018 with pneumonia.  Pharmacy has been consulted for Cefepime dosing. S/p bronch 09/04/2018.  Hx of vaping. Patient received Azithromycin x 1 and Cefepime 2 gram x 1 in ED.  Plan: Will continue with Cefepime 2 gram IV q12h   Height: 5\' 4"  (680.8 cm) Weight: 150 lb 5.7 oz (68.2 kg) IBW/kg (Calculated) : 54.7  Temp (24hrs), Avg:96.5 F (35.8 C), Min:95 F (35 C), Max:98.1 F (36.7 C)  Recent Labs  Lab 09/05/18 0648 09/06/18 0614  WBC 14.5* 8.2  CREATININE 1.31* 1.31*  LATICACIDVEN 3.4*  --     Estimated Creatinine Clearance: 39.5 mL/min (A) (by C-G formula based on SCr of 1.31 mg/dL (H)).    No Known Allergies  Antimicrobials this admission: Azith 1/7 >> x 1 dose Cefepime 1/7 >>    Dose adjustments this admission:    Microbiology results: 1/7 BCx: NG TD 1/7 sputum: pending  1/7 MRSA PCR: pending    Thank you for allowing pharmacy to be a part of this patient's care.  Pernell Dupre, PharmD, BCPS Clinical Pharmacist 09/06/2018 2:13 PM

## 2018-09-06 NOTE — Progress Notes (Signed)
[facility] [place] NAME: Melissa Knox    MR#:  [id number]  DATE OF BIRTH:  [birth year]  SUBJECTIVE:  CHIEF COMPLAINT:   Chief Complaint  Patient presents with  . Shortness of Breath   Woke up easily on the ventilator. On full vent support and propofol  REVIEW OF SYSTEMS:    Review of Systems  Unable to perform ROS: Intubated    DRUG ALLERGIES:  No Known Allergies  VITALS:  Blood pressure (!) 89/59, pulse 77, temperature 97.7 F (36.5 C), resp. rate 14, height 5\' 4"  (1.626 m), weight 68.2 kg, SpO2 96 %.  PHYSICAL EXAMINATION:   Physical Exam  GENERAL:  68 y.o.-year-old patient lying in the bed .  EYES: Pupils equal, round, reactive to light and accommodation. No scleral icterus. Extraocular muscles intact.  HEENT: Head atraumatic, normocephalic. Oropharynx and nasopharynx clear. ETT NECK:  Supple, no jugular venous distention. No thyroid enlargement, no tenderness.  LUNGS: b/l Coarse breath sounds CARDIOVASCULAR: S1, S2 normal. No murmurs, rubs, or gallops.  ABDOMEN: Soft, nontender, nondistended. Bowel sounds present. No organomegaly or mass.  EXTREMITIES: No cyanosis, clubbing or edema b/l.    NEUROLOGIC: Cranial nerves II through XII are intact. No focal Motor or sensory deficits b/l.   PSYCHIATRIC: The patient is drowzy SKIN: No obvious rash, lesion, or ulcer.   LABORATORY PANEL:   CBC Recent Labs  Lab 09/06/18 0614  WBC 8.2  HGB 9.0*  HCT 29.8*  PLT 448*   ------------------------------------------------------------------------------------------------------------------ Chemistries  Recent Labs  Lab 09/05/18 0648 09/06/18 0614  NA 141 141  K 3.4* 3.7  CL 106 111  CO2 26 21*  GLUCOSE 110* 139*  BUN 13 17  CREATININE 1.31* 1.31*  CALCIUM 8.2* 8.1*  AST 17  --   ALT 9  --   ALKPHOS 66  --   BILITOT 0.6  --     ------------------------------------------------------------------------------------------------------------------  Cardiac Enzymes Recent Labs  Lab 09/05/18 0648  TROPONINI 0.04*   ------------------------------------------------------------------------------------------------------------------  RADIOLOGY:  Dg Chest Port 1 View  Result Date: 09/06/2018 CLINICAL DATA:  Acute respiratory failure EXAM: PORTABLE CHEST 1 VIEW COMPARISON:  Yesterday FINDINGS: Endotracheal tube tip 2 cm above the carina. The orogastric tube is coiled in the stomach. Borderline cardiomegaly. Extensive atherosclerosis. Coarse opacities on the left more than right. Improved aeration, especially the right perihilar lung. No effusion or pneumothorax. IMPRESSION: 1. Stable hardware positioning. 2. Pulmonary fibrosis with mild improvement in aeration. Electronically Signed   By: Monte Fantasia M.D.   On: 09/06/2018 05:35   Dg Chest Port 1 View  Result Date: 09/05/2018 CLINICAL DATA:  Short of breath COPD.  Bronchoscopy 24 hours ago. EXAM: PORTABLE CHEST 1 VIEW COMPARISON:  06/03/2018 FINDINGS: Endotracheal tube in good position.  Gastric tube in the stomach. Negative for pneumothorax post bronchoscopy Severe chronic lung disease. Progression of bibasilar airspace disease. Small bilateral pleural effusions, with progression on the left. IMPRESSION: No pneumothorax post bronchoscopy Endotracheal tube in good position Severe chronic airspace disease bilaterally. Progression of bibasilar airspace disease and left effusion. Electronically Signed   By: Franchot Gallo M.D.   On: 09/05/2018 07:25     ASSESSMENT AND PLAN:   1.  Acute hypoxic respiratory failure. Patient intubated and on full vent support.propofol. Appreciate intensivist help 2.  Pneumonia and COPD exacerbation. Antibiotics with cefepime and Zithromax. steroids. 3.  Acute on chronic anemia Hb improved. 2 units PRBC transfused 4.  History of CHF.  Lasix 5.  Hypothyroidism unspecified on levothyroxine 6.  Essential hypertension  Antihypertensives held 7.  Hyperlipidemia  Statin held  All the records are reviewed and case discussed with Care Management/Social Worker Management plans discussed with the patient, family and they are in agreement.  CODE STATUS: FULL CODE  DVT Prophylaxis: SCDs  TOTAL TIME TAKING CARE OF THIS PATIENT: 35 minutes.   Leia Alf Gabrien Mentink M.D on 09/06/2018 at 2:38 PM  Between 7am to 6pm - Pager - [phone]  After 6pm go to [email or web address] - password [email or web address] [facility]  Office  [phone]  CC: Primary care physician; Ronnell Freshwater, NP  Note: This dictation was prepared with Dragon dictation along with smaller phrase technology. Any transcriptional errors that result from this process are unintentional.

## 2018-09-06 NOTE — Progress Notes (Signed)
Follow up - Critical Care Medicine Note  Patient Details:    Melissa Knox is an 68 y.o. female. This is a 68 year old woman with a history of advanced COPD and fibrosis associated with the same, with chronic respiratory failure admitted to [facility] ICU on 05 September 2018 with acute on chronic respiratory failure.  The patient had a bronchoscopy the day prior at [facility].  The patient presented in respiratory distress and profoundly hypoxemic and required intubation and mechanical ventilation.  She has been oxygen dependent since 2013.  Of note she has had bullous emphysema with fibrotic changes most prominent in the lower lobes.  Lines, Airways, Drains: Airway 7 mm (Active)  Secured at (cm) 23 cm 09/06/2018  7:55 PM  Measured From Lips 09/06/2018  7:55 PM  Secured Location Left 09/06/2018  7:55 PM  Secured By [employer] 09/06/2018  7:55 PM  Tube Holder Repositioned Yes 09/06/2018  7:55 PM  Cuff Pressure (cm H2O) 23 cm H2O 09/06/2018  7:55 PM  Site Condition Dry 09/06/2018  7:55 PM     NG/OG Tube Orogastric 18 Fr. Center mouth Xray;Aucultation Documented cm marking at nare/ corner of mouth 80 cm (Active)  Cm Marking at Nare/Corner of Mouth (if applicable) 78 cm 01/05/5637  7:35 PM  Site Assessment Dry 09/06/2018  7:35 PM  Ongoing Placement Verification No change in cm markings or external length of tube from initial placement;No change in respiratory status;No acute changes, not attributed to clinical condition 09/06/2018  4:00 PM  Status Infusing tube feed 09/06/2018  7:35 PM  Amount of suction 60 mmHg 09/06/2018  4:00 AM  Drainage Appearance Brown 09/05/2018  9:00 PM  Output (mL) 50 mL 09/06/2018 12:00 AM     Urethral Catheter Lorrie, RN  Non-latex;Straight-tip;Latex 14 Fr. (Active)  Indication for Insertion or Continuance of Catheter Unstable critical patients (first 24-48 hours) 09/06/2018  7:35 PM  Site Assessment Clean;Intact;Dry 09/06/2018  7:35 PM  Catheter Maintenance Bag below level of  bladder;Catheter secured;Drainage bag/tubing not touching floor;Insertion date on drainage bag;No dependent loops;Seal intact 09/06/2018  7:47 PM  Collection Container Standard drainage bag 09/06/2018  7:35 PM  Securement Method Securing device (Describe) 09/06/2018  7:35 PM  Urinary Catheter Interventions Unclamped 09/06/2018 12:10 AM  Output (mL) 100 mL 09/06/2018  6:00 PM    Anti-infectives:  Anti-infectives (From admission, onward)   Start     Dose/Rate Route Frequency Ordered Stop   09/05/18 2000  ceFEPIme (MAXIPIME) 2 g in sodium chloride 0.9 % 100 mL IVPB     2 g 200 mL/hr over 30 Minutes Intravenous Every 12 hours 09/05/18 1037     09/05/18 0730  ceFEPIme (MAXIPIME) 2 g in sodium chloride 0.9 % 100 mL IVPB     2 g 200 mL/hr over 30 Minutes Intravenous  Once 09/05/18 0722 09/05/18 0842   09/05/18 0715  azithromycin (ZITHROMAX) 500 mg in sodium chloride 0.9 % 250 mL IVPB     500 mg 250 mL/hr over 60 Minutes Intravenous  Once 09/05/18 7564 09/05/18 3329      Microbiology: Results for orders placed or performed during the hospital encounter of 09/05/18  Blood Culture (routine x 2)     Status: None (Preliminary result)   Collection Time: 09/05/18  6:48 AM  Result Value Ref Range Status   Specimen Description BLOOD LEFT ANTECUBITAL  Final   Special Requests   Final    BOTTLES DRAWN AEROBIC AND ANAEROBIC Blood Culture results may not be optimal  due to an excessive volume of blood received in culture bottles   Culture   Final    NO GROWTH < 24 HOURS Performed at [facility], [address] [place], [place] [address]    Report Status PENDING  Incomplete  MRSA PCR Screening     Status: None   Collection Time: 09/05/18  2:46 PM  Result Value Ref Range Status   MRSA by PCR NEGATIVE NEGATIVE Final    Comment:        The GeneXpert MRSA Assay (FDA approved for NASAL specimens only), is one component of a comprehensive MRSA colonization surveillance program. It is not intended  to diagnose MRSA infection nor to guide or monitor treatment for MRSA infections. Performed at [facility], [address] [place],  [address]     Best Practice/Protocols:  VTE Prophylaxis: Mechanical GI Prophylaxis: Proton Pump Inhibitor Continous Sedation Precedex added today due to agitation propofol discontinued.  Events:   Studies: Dg Chest Port 1 View  Result Date: 09/06/2018 CLINICAL DATA:  Acute respiratory failure EXAM: PORTABLE CHEST 1 VIEW COMPARISON:  Yesterday FINDINGS: Endotracheal tube tip 2 cm above the carina. The orogastric tube is coiled in the stomach. Borderline cardiomegaly. Extensive atherosclerosis. Coarse opacities on the left more than right. Improved aeration, especially the right perihilar lung. No effusion or pneumothorax. IMPRESSION: 1. Stable hardware positioning. 2. Pulmonary fibrosis with mild improvement in aeration. Electronically Signed   By: Monte Fantasia M.D.   On: 09/06/2018 05:35   Dg Chest Port 1 View  Result Date: 09/05/2018 CLINICAL DATA:  Short of breath COPD.  Bronchoscopy 24 hours ago. EXAM: PORTABLE CHEST 1 VIEW COMPARISON:  06/03/2018 FINDINGS: Endotracheal tube in good position.  Gastric tube in the stomach. Negative for pneumothorax post bronchoscopy Severe chronic lung disease. Progression of bibasilar airspace disease. Small bilateral pleural effusions, with progression on the left. IMPRESSION: No pneumothorax post bronchoscopy Endotracheal tube in good position Severe chronic airspace disease bilaterally. Progression of bibasilar airspace disease and left effusion. Electronically Signed   By: Franchot Gallo M.D.   On: 09/05/2018 07:25    Consults:    Subjective:    Overnight Issues: Remains intubated and mechanically ventilated. When wake-up assessment was performed she became tachypneic, dyssynchronous with the ventilator and agitated.  Precedex had to be added.  Objective:  Vital signs for last 24  hours: Temp:  [95 F (35 C)-99.5 F (37.5 C)] 99.5 F (37.5 C) (01/08 2100) Pulse Rate:  [60-109] 101 (01/08 2100) Resp:  [9-25] 15 (01/08 2100) BP: (67-108)/(49-74) 103/71 (01/08 2100) SpO2:  [93 %-100 %] 97 % (01/08 2100) FiO2 (%):  [32 %-35 %] 35 % (01/08 1955)  Hemodynamic parameters for last 24 hours:    Intake/Output from previous day: 01/07 0701 - 01/08 0700 In: 3203.6 [I.V.:796; Blood:807.5; IV Piggyback:1600] Out: 975 [Urine:925; Emesis/NG output:50]  Intake/Output this shift: No intake/output data recorded.  Vent settings for last 24 hours: Vent Mode: PRVC FiO2 (%):  [32 %-35 %] 35 % Set Rate:  [16 bmp] 16 bmp Vt Set:  [450 mL] 450 mL PEEP:  [5 cmH20] 5 cmH20 Plateau Pressure:  [18 cmH20] 18 cmH20  Physical Exam:  Vital signs as noted GENERAL:critically ill appearing, dyssynchronous with the ventilator when wake-up assessment performed, agitated. HEAD: Normocephalic, atraumatic.  EYES: PERRL.  No scleral icterus.  MOUTH: Moist mucosal membranes.  No thrush. NECK: Supple. No thyromegaly.  No tracheal deviation. No JVD.  No crepitus. PULMONARY: Diffuse rhonchi, diminished breath  sounds at the bases.  No wheezes. CARDIOVASCULAR: S1 and S2.  Tachycardic rate with regular rhythm. No murmurs, rubs, or gallops.  GASTROINTESTINAL: Soft, nontender, -distended. No masses. Positive bowel sounds. No hepatosplenomegaly.  MUSCULOSKELETAL: No swelling, clubbing, or edema.  NEUROLOGIC: When wake-up assessment performed she is agitated, moves all fours.  Does not follow commands SKIN:intact,warm,dry  Assessment/Plan:  1.  Acute on chronic mixed respiratory failure (hypoxic and hypercarbic): She is too agitated when sedation is lightened.  A synchronous with the ventilator when this occurs.  Not ready for weaning.  She has significant issues chronically, her current exacerbation appears to be postprocedural after bronchoscopy day prior to admission.  I have reviewed the records  from [facility].  Bronchial washings were obtained, does not appear that they performed biopsies.  So far the findings are nonrevealing.  We will continue mechanical ventilation support for now reassess in the morning.  Her prognosis however is extremely guarded due to severe underlying pulmonary disease.  2.  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  Note that the emphysema is mostly lower lobe in some instances, obtain alpha-1 antitrypsin for completeness.  Continue steroids and begin to taper as her bronchospasm is controlled.  3.  Agitated delirium: Initiated Precedex, wean off propofol, continue fentanyl infusion, wake up assessment in the morning as tolerated.  4.  Anemia appears to be acute on chronic.  No obvious blood loss.  She could have actually had pulmonary alveolar hemorrhage after bronchoscopy.  Monitor H&H transfuse for hemoglobin of 7 or below.  5.  Nutrition: Begin tube feeds.    LOS: 1 day   Additional comments:Multidisciplinary rounds were performed with the ICU team.  Patient's husband was at bedside, case discussed in detail with him as well as overall prognosis which is exceedingly guarded.  Critical Care Total Time*: 45 Minutes  C. Derrill Kay, MD [facility]  09/06/2018  *Care during the described time interval was provided by me and/or other providers on the critical care team.  I have reviewed this patient's available data, including medical history, events of note, physical examination and test results as part of my evaluation.

## 2018-09-07 ENCOUNTER — Telehealth: Payer: Self-pay | Admitting: Internal Medicine

## 2018-09-07 DIAGNOSIS — J441 Chronic obstructive pulmonary disease with (acute) exacerbation: Secondary | ICD-10-CM

## 2018-09-07 DIAGNOSIS — E440 Moderate protein-calorie malnutrition: Secondary | ICD-10-CM

## 2018-09-07 LAB — CBC
HCT: 31.1 % — ABNORMAL LOW (ref 36.0–46.0)
Hemoglobin: 9.5 g/dL — ABNORMAL LOW (ref 12.0–15.0)
MCH: 24.1 pg — ABNORMAL LOW (ref 26.0–34.0)
MCHC: 30.5 g/dL (ref 30.0–36.0)
MCV: 78.9 fL — ABNORMAL LOW (ref 80.0–100.0)
Platelets: 402 10*3/uL — ABNORMAL HIGH (ref 150–400)
RBC: 3.94 MIL/uL (ref 3.87–5.11)
RDW: 18.3 % — ABNORMAL HIGH (ref 11.5–15.5)
WBC: 16.3 10*3/uL — ABNORMAL HIGH (ref 4.0–10.5)
nRBC: 0.1 % (ref 0.0–0.2)

## 2018-09-07 LAB — GLUCOSE, CAPILLARY
GLUCOSE-CAPILLARY: 125 mg/dL — AB (ref 70–99)
Glucose-Capillary: 132 mg/dL — ABNORMAL HIGH (ref 70–99)
Glucose-Capillary: 166 mg/dL — ABNORMAL HIGH (ref 70–99)
Glucose-Capillary: 142 mg/dL — ABNORMAL HIGH (ref 70–99)
Glucose-Capillary: 112 mg/dL — ABNORMAL HIGH (ref 70–99)

## 2018-09-07 LAB — BASIC METABOLIC PANEL
Anion gap: 6 (ref 5–15)
BUN: 23 mg/dL (ref 8–23)
CO2: 23 mmol/L (ref 22–32)
Calcium: 8.3 mg/dL — ABNORMAL LOW (ref 8.9–10.3)
Chloride: 112 mmol/L — ABNORMAL HIGH (ref 98–111)
Creatinine, Ser: 1.12 mg/dL — ABNORMAL HIGH (ref 0.44–1.00)
GFR calc Af Amer: 59 mL/min — ABNORMAL LOW (ref >60)
GFR, EST NON AFRICAN AMERICAN: 51 mL/min — AB (ref >60)
Glucose, Bld: 181 mg/dL — ABNORMAL HIGH (ref 70–99)
Potassium: 3.5 mmol/L (ref 3.5–5.1)
Sodium: 141 mmol/L (ref 135–145)

## 2018-09-07 LAB — MAGNESIUM: Magnesium: 1.8 mg/dL (ref 1.7–2.4)

## 2018-09-07 LAB — PHOSPHORUS: Phosphorus: 3 mg/dL (ref 2.5–4.6)

## 2018-09-07 MED ORDER — SODIUM CHLORIDE 0.9 % IV SOLN
500.0000 mg | INTRAVENOUS | Status: DC
  Administered 2018-09-07 – 2018-09-08 (×2): 500 mg via INTRAVENOUS
  Filled 2018-09-07 (×2): qty 500

## 2018-09-07 NOTE — Procedures (Signed)
Extubation Procedure Note  Patient Details:   Name: Melissa Knox DOB: April 17, 1951 MRN: [id number]   Airway Documentation:    Vent end date: 09/07/18 Vent end time: 1552   Evaluation  O2 sats: transiently fell during during procedure Complications: No apparent complications Patient did tolerate procedure well. Bilateral Breath Sounds: Clear, Diminished   Yes.  Cuff leak positive prior to extubation.  Order per NNP at bedside.  Attempted 5 lpm [place], but unable to maintain saturation.  SpO2 low 83%.  Transition to 10lpm via HFNC.  Sats stable >94% at this time.  Able to cough and speak.  [name] 09/07/2018, 4:00 PM

## 2018-09-07 NOTE — Progress Notes (Signed)
Ventilatory respiration rate changed from 16 to 12, per NP verbal order. Patient ETCO2 level low, rate adjusted.

## 2018-09-07 NOTE — Telephone Encounter (Signed)
Signed CMN for oxygen put into [facility] folder to be picked up.jw

## 2018-09-07 NOTE — Progress Notes (Signed)
Follow up - Critical Care Medicine Note  Patient Details:    Melissa Knox is an 68 y.o. female. 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  The patient had a bronchoscopy the day prior at [facility].  The patient presented in respiratory distress and profoundly hypoxemic and required intubation and mechanical ventilation.  She has been oxygen dependent since 2013.  Of note she has had bullous emphysema with fibrotic changes most prominent in the lower lobes.  Lines, Airways, Drains: Airway 7 mm (Active)  Secured at (cm) 23 cm 09/06/2018  7:55 PM  Measured From Lips 09/06/2018  7:55 PM  Secured Location Left 09/06/2018  7:55 PM  Secured By [employer] 09/06/2018  7:55 PM  Tube Holder Repositioned Yes 09/06/2018  7:55 PM  Cuff Pressure (cm H2O) 23 cm H2O 09/06/2018  7:55 PM  Site Condition Dry 09/06/2018  7:55 PM     NG/OG Tube Orogastric 18 Fr. Center mouth Xray;Aucultation Documented cm marking at nare/ corner of mouth 80 cm (Active)  Cm Marking at Nare/Corner of Mouth (if applicable) 78 cm 09/04/1094  7:35 PM  Site Assessment Dry 09/06/2018  7:35 PM  Ongoing Placement Verification No change in cm markings or external length of tube from initial placement;No change in respiratory status;No acute changes, not attributed to clinical condition 09/06/2018  4:00 PM  Status Infusing tube feed 09/06/2018  7:35 PM  Amount of suction 60 mmHg 09/06/2018  4:00 AM  Drainage Appearance Brown 09/05/2018  9:00 PM  Output (mL) 50 mL 09/06/2018 12:00 AM     Urethral Catheter Lorrie, RN  Non-latex;Straight-tip;Latex 14 Fr. (Active)  Indication for Insertion or Continuance of Catheter Unstable critical patients (first 24-48 hours) 09/06/2018  7:35 PM  Site Assessment Clean;Intact;Dry 09/06/2018  7:35 PM  Catheter Maintenance Bag below level of  bladder;Catheter secured;Drainage bag/tubing not touching floor;Insertion date on drainage bag;No dependent loops;Seal intact 09/06/2018  7:47 PM  Collection Container Standard drainage bag 09/06/2018  7:35 PM  Securement Method Securing device (Describe) 09/06/2018  7:35 PM  Urinary Catheter Interventions Unclamped 09/06/2018 12:10 AM  Output (mL) 100 mL 09/06/2018  6:00 PM    Anti-infectives:  Anti-infectives (From admission, onward)   Start     Dose/Rate Route Frequency Ordered Stop   09/07/18 1115  azithromycin (ZITHROMAX) 500 mg in sodium chloride 0.9 % 250 mL IVPB     500 mg 250 mL/hr over 60 Minutes Intravenous Every 24 hours 09/07/18 1100 09/10/18 1059   09/05/18 2000  ceFEPIme (MAXIPIME) 2 g in sodium chloride 0.9 % 100 mL IVPB  Status:  Discontinued     2 g 200 mL/hr over 30 Minutes Intravenous Every 12 hours 09/05/18 1037 09/07/18 1100   09/05/18 0730  ceFEPIme (MAXIPIME) 2 g in sodium chloride 0.9 % 100 mL IVPB     2 g 200 mL/hr over 30 Minutes Intravenous  Once 09/05/18 0722 09/05/18 0842   09/05/18 0715  azithromycin (ZITHROMAX) 500 mg in sodium chloride 0.9 % 250 mL IVPB     500 mg 250 mL/hr over 60 Minutes Intravenous  Once 09/05/18 0454 09/05/18 0981      Microbiology: Results for orders placed or performed during the hospital encounter of 09/05/18  Blood Culture (routine x 2)     Status: None (Preliminary result)   Collection Time: 09/05/18  6:48 AM  Result Value Ref Range Status   Specimen Description BLOOD LEFT ANTECUBITAL  Final   Special Requests   Final    BOTTLES DRAWN AEROBIC AND ANAEROBIC Blood Culture results may not be optimal due to an excessive volume of blood received in culture bottles   Culture   Final    NO GROWTH 2 DAYS Performed at [facility], [address], [place], [place] [address]    Report Status PENDING  Incomplete  MRSA PCR Screening     Status: None   Collection Time: 09/05/18  2:46 PM  Result Value Ref Range Status   MRSA by PCR  NEGATIVE NEGATIVE Final    Comment:        The GeneXpert MRSA Assay (FDA approved for NASAL specimens only), is one component of a comprehensive MRSA colonization surveillance program. It is not intended to diagnose MRSA infection nor to guide or monitor treatment for MRSA infections. Performed at [facility], [address] [place], [place] [address]     Best Practice/Protocols:  VTE Prophylaxis: Mechanical GI Prophylaxis: Proton Pump Inhibitor Continous Sedation Precedex / Fentanyl Weaning Protocol  Events: Bronchoscopy [facility] 05 Sep 2018 Acute on chronic resp failure - intubation 8Jan 2020 Mechanical Ventilation 06 Sep 2018 Admission [facility] ICU 06 Sep 2018  Studies: Dg Chest [place] 1 View  Result Date: 09/06/2018 CLINICAL DATA:  Acute respiratory failure EXAM: PORTABLE CHEST 1 VIEW COMPARISON:  Yesterday FINDINGS: Endotracheal tube tip 2 cm above the carina. The orogastric tube is coiled in the stomach. Borderline cardiomegaly. Extensive atherosclerosis. Coarse opacities on the left more than right. Improved aeration, especially the right perihilar lung. No effusion or pneumothorax. IMPRESSION: 1. Stable hardware positioning. 2. Pulmonary fibrosis with mild improvement in aeration. Electronically Signed   By: Monte Fantasia M.D.   On: 09/06/2018 05:35   Dg Chest [place] 1 View  Result Date: 09/05/2018 CLINICAL DATA:  Short of breath COPD.  Bronchoscopy 24 hours ago. EXAM: PORTABLE CHEST 1 VIEW COMPARISON:  06/03/2018 FINDINGS: Endotracheal tube in good position.  Gastric tube in the stomach. Negative for pneumothorax post bronchoscopy Severe chronic lung disease. Progression of bibasilar airspace disease. Small bilateral pleural effusions, with progression on the left. IMPRESSION: No pneumothorax post bronchoscopy Endotracheal tube in good position Severe chronic airspace disease bilaterally. Progression of bibasilar airspace disease and left effusion. Electronically Signed   By:  Franchot Gallo M.D.   On: 09/05/2018 07:25    Consults:    Subjective:    Overnight Issues: Remains intubated and mechanically ventilated. When wake-up assessment was performed she was noted to be calm, cooperative and able to follow commands.  Discontinuing sedatives and initiating spontaneous breathing trial.  Objective:  Vital signs for last 24 hours: Temp:  [95.7 F (35.4 C)-100.4 F (38 C)] 98.2 F (36.8 C) (01/09 2000) Pulse Rate:  [89-113] 90 (01/09 2000) Resp:  [8-21] 8 (01/09 2000) BP: (86-153)/(58-110) 136/66 (01/09 2000) SpO2:  [90 %-99 %] 96 % (01/09 2000) FiO2 (%):  [30 %-40 %] 30 % (01/09 1524) Weight:  [67.3 kg] 67.3 kg (01/09 0500)  Hemodynamic parameters for last 24 hours:    Intake/Output from previous day: 01/08 0701 - 01/09 0700 In: 786.9 [I.V.:756.2; NG/GT:30.7] Out: 2275 [Urine:2275]  Intake/Output this shift: No intake/output data recorded.  Vent settings for last 24 hours: Vent Mode: PSV;CPAP FiO2 (%):  [30 %-40 %] 30 % Set Rate:  [12 bmp-16 bmp] 12 bmp Vt Set:  [450 mL] 450 mL  PEEP:  [5 cmH20] 5 cmH20 Pressure Support:  [5 cmH20] 5 cmH20  Physical Exam:  Vital signs as noted GENERAL:chronically ill appearing, awakens easily, follows commands, calm. HEAD: Normocephalic, atraumatic.  EYES: PERRL.  No scleral icterus.  MOUTH: Moist mucosal membranes.  No thrush. NECK: Supple. No thyromegaly.  No tracheal deviation. No JVD.  No crepitus. PULMONARY: Diminished breath sounds at the bases.  No wheezes.  Few rhonchi. CARDIOVASCULAR: S1 and S2.  Regular rate rate and regular rhythm. No murmurs, rubs, or gallops.  GASTROINTESTINAL: Soft, nontender, -distended. No masses. Positive bowel sounds. No hepatosplenomegaly.  MUSCULOSKELETAL: No swelling, clubbing, or edema.  NEUROLOGIC: When wake-up assessment performed she is calm and follows commands.  SKIN:intact,warm,dry On spontaneous breathing trials CPAP 5 and PS 5 she is returning great tidal  volumes. Assessment/Plan:  1.  Acute on chronic mixed respiratory failure (hypoxic and hypercarbic): She is calm and follows commands today.  She is tolerating spontaneous breathing trial.  Will proceed with extubation.  I have reviewed the records from [facility].  Bronchial washings were obtained, does not appear that they performed biopsies.  So far the findings are nonrevealing.  I reviewed the [facility] data again today with nothing to add.   Her prognosis long-term  is extremely guarded due to severe underlying pulmonary disease.  2.  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  Note that the emphysema is mostly lower lobe in some instances, obtain alpha-1 antitrypsin for completeness, pending..  Continue steroids and continue to taper as her bronchospasm is controlled.  3.  Agitated delirium: Initiated Precedex, delirium has resolved weaning off fentanyl and Precedex infusions, wake up assessment shows her to be calm and following commands.  4.  Anemia appears to be acute on chronic.  No obvious blood loss.  She could have actually had pulmonary alveolar hemorrhage after bronchoscopy.  Monitor H&H transfuse for hemoglobin of 7 or below.  5.  Nutrition: After extubation will perform swallow eval and advance diet as tolerated and directed by speech pathology.    LOS: 2 days   Additional comments:Multidisciplinary rounds were performed with the ICU team.  Patient's husband was at bedside, case discussed in detail with him as well as overall prognosis which is exceedingly guarded.  Critical Care Total Time*: 40 Minutes  C. Derrill Kay, MD [facility]  09/07/2018  *Care during the described time interval was provided by me and/or other providers on the critical care team.  I have reviewed this patient's available data, including medical history, events of note, physical examination and test results as part of my  evaluation.

## 2018-09-07 NOTE — Progress Notes (Signed)
Patient ID: Melissa Knox, female   DOB: [birth year], 68 y.o.   MRN: [id number]  [employer] PROGRESS NOTE  Melissa Knox OIN:[id number] DOB: 1951-07-12 DOA: 09/05/2018 PCP: Melissa Freshwater, NP  HPI/Subjective: Patient able to shake her head yes or no to some questions.  Still intubated when I saw her.   Objective: Vitals:   09/07/18 1300 09/07/18 1400  BP: 93/66 96/66  Pulse: 91 99  Resp: 11 15  Temp: (!) 95.9 F (35.5 C) (!) 96.8 F (36 C)  SpO2: 93% 90%    Filed Weights   09/05/18 0746 09/05/18 1443 09/07/18 0500  Weight: 64.9 kg 68.2 kg 67.3 kg    ROS: Review of Systems  Unable to perform ROS: Intubated  Cardiovascular: Negative for chest pain.  Gastrointestinal: Negative for abdominal pain.  Musculoskeletal: Negative for joint pain.   Exam: Physical Exam  HENT:  Nose: No mucosal edema.  Mouth/Throat: No oropharyngeal exudate or posterior oropharyngeal edema.  Eyes: Pupils are equal, round, and reactive to light. Conjunctivae, EOM and lids are normal.  Neck: No JVD present. Carotid bruit is not present. No edema present. No thyroid mass and no thyromegaly present.  Cardiovascular: S1 normal and S2 normal. Exam reveals no gallop.  No murmur heard. Pulses:      Dorsalis pedis pulses are 2+ on the right side and 2+ on the left side.  Respiratory: No respiratory distress. She has decreased breath sounds in the right middle field, the right lower field, the left middle field and the left lower field. She has no wheezes. She has no rhonchi. She has no rales.  GI: Soft. Bowel sounds are normal. There is no abdominal tenderness.  Lymphadenopathy:    She has no cervical adenopathy.  Neurological: She is alert.  Able to wiggle toes  Skin: Skin is warm. No rash noted. Nails show no clubbing.  Psychiatric:  Able to shake her head yes or no to some questions.      Data Reviewed: Basic Metabolic Panel: Recent Labs  Lab 09/05/18 0648 09/06/18 0614 09/07/18 0633   NA 141 141 141  K 3.4* 3.7 3.5  CL 106 111 112*  CO2 26 21* 23  GLUCOSE 110* 139* 181*  BUN '13 17 23  '$ CREATININE 1.31* 1.31* 1.12*  CALCIUM 8.2* 8.1* 8.3*  MG  --   --  1.8  PHOS  --   --  3.0   Liver Function Tests: Recent Labs  Lab 09/05/18 0648  AST 17  ALT 9  ALKPHOS 66  BILITOT 0.6  PROT 7.8  ALBUMIN 2.9*   CBC: Recent Labs  Lab 09/05/18 0648 09/06/18 0614 09/07/18 0633  WBC 14.5* 8.2 16.3*  NEUTROABS 10.7*  --   --   HGB 6.0* 9.0* 9.5*  HCT 21.4* 29.8* 31.1*  MCV 77.0* 79.3* 78.9*  PLT 625* 448* 402*   Cardiac Enzymes: Recent Labs  Lab 09/05/18 0648  TROPONINI 0.04*   BNP (last 3 results) Recent Labs    04/11/18 0751 06/03/18 0405 09/05/18 0648  BNP 328.0* 369.0* 431.0*     CBG: Recent Labs  Lab 09/06/18 1929 09/06/18 2328 09/07/18 0330 09/07/18 0757 09/07/18 1143  GLUCAP 132* 137* 132* 166* 142*    Recent Results (from the past 240 hour(s))  Blood Culture (routine x 2)     Status: None (Preliminary result)   Collection Time: 09/05/18  6:48 AM  Result Value Ref Range Status   Specimen Description BLOOD LEFT ANTECUBITAL  Final  Special Requests   Final    BOTTLES DRAWN AEROBIC AND ANAEROBIC Blood Culture results may not be optimal due to an excessive volume of blood received in culture bottles   Culture   Final    NO GROWTH 2 DAYS Performed at [facility], [address], [place], [place] [address]    Report Status PENDING  Incomplete  MRSA PCR Screening     Status: None   Collection Time: 09/05/18  2:46 PM  Result Value Ref Range Status   MRSA by PCR NEGATIVE NEGATIVE Final    Comment:        The GeneXpert MRSA Assay (FDA approved for NASAL specimens only), is one component of a comprehensive MRSA colonization surveillance program. It is not intended to diagnose MRSA infection nor to guide or monitor treatment for MRSA infections. Performed at [facility], [address] [place], [place]  [address]      Studies: Dg Chest [facility] 1 View  Result Date: 09/06/2018 CLINICAL DATA:  Acute respiratory failure EXAM: PORTABLE CHEST 1 VIEW COMPARISON:  Yesterday FINDINGS: Endotracheal tube tip 2 cm above the carina. The orogastric tube is coiled in the stomach. Borderline cardiomegaly. Extensive atherosclerosis. Coarse opacities on the left more than right. Improved aeration, especially the right perihilar lung. No effusion or pneumothorax. IMPRESSION: 1. Stable hardware positioning. 2. Pulmonary fibrosis with mild improvement in aeration. Electronically Signed   By: Monte Fantasia M.D.   On: 09/06/2018 05:35    Scheduled Meds: . budesonide (PULMICORT) nebulizer solution  0.5 mg Nebulization BID  . chlorhexidine gluconate (MEDLINE KIT)  15 mL Mouth Rinse BID  . fentaNYL (SUBLIMAZE) injection  200 mcg Intravenous Once  . ipratropium-albuterol  3 mL Nebulization Q4H  . ketamine (KETALAR) injection '10mg'$ /mL (IV use)  150 mg Intravenous Once  . levothyroxine  50 mcg Oral QAC breakfast  . mouth rinse  15 mL Mouth Rinse 10 times per day  . methylPREDNISolone (SOLU-MEDROL) injection  60 mg Intravenous Q12H  . pantoprazole (PROTONIX) IV  40 mg Intravenous Q24H  . rocuronium  80 mg Intravenous Once   Continuous Infusions: . azithromycin 500 mg (09/07/18 1126)  . dexmedetomidine (PRECEDEX) IV infusion 1 mcg/kg/hr (09/07/18 1127)  . feeding supplement (VITAL AF 1.2 CAL) 35 mL/hr at 09/07/18 0600  . fentaNYL infusion INTRAVENOUS 200 mcg/hr (09/07/18 0600)  . norepinephrine (LEVOPHED) Adult infusion Stopped (09/07/18 0200)  . propofol (DIPRIVAN) infusion Stopped (09/06/18 1200)    Assessment/Plan:  1. Acute hypoxic respiratory failure.  Patient on 35% FiO2 on the ventilator.  Sedated with Precedex and fentanyl. 2. COPD exacerbation and pneumonia.  Patient on Solu-Medrol and Zithromax only by critical care specialist. 3. Iron deficiency anemia.  Given 2 units of packed red blood cells with good  response. 4. History of diastolic congestive heart failure.  No signs currently 5. Hypothyroidism unspecified on levothyroxine 6. Hyperlipidemia unspecified 7. Essential hypertension  Code Status:     Code Status Orders  (From admission, onward)         Start     Ordered   09/05/18 0817  Full code  Continuous     09/05/18 0816        Code Status History    Date Active Date Inactive Code Status Order ID Comments User Context   06/03/2018 0955 06/08/2018 1838 Partial Code [id number]  Demetrios Loll, MD Inpatient   06/03/2018 0757 06/03/2018 0955 Full Code [id number]  Harrie Foreman, MD Inpatient   04/11/2018 1256 04/14/2018  1807 Full Code [id number]  Vaughan Basta, MD ED   05/29/2017 1024 05/30/2017 1819 DNR [id number]  Hillary Bow, MD Inpatient   05/28/2017 2323 05/29/2017 1024 Full Code [id number]  Quintella Baton, MD Inpatient   02/06/2017 1525 02/08/2017 1404 Full Code [id number]  Idelle Crouch, MD ED     Family Communication: As per critical care team Disposition Plan: To be determined  Consultants:  Critical care specialist  Antibiotics:  Zithromax only  Time spent: 28 minutes  [place]

## 2018-09-08 LAB — GLUCOSE, CAPILLARY
Glucose-Capillary: 111 mg/dL — ABNORMAL HIGH (ref 70–99)
Glucose-Capillary: 115 mg/dL — ABNORMAL HIGH (ref 70–99)
Glucose-Capillary: 127 mg/dL — ABNORMAL HIGH (ref 70–99)
Glucose-Capillary: 128 mg/dL — ABNORMAL HIGH (ref 70–99)
Glucose-Capillary: 95 mg/dL (ref 70–99)
Glucose-Capillary: 99 mg/dL (ref 70–99)

## 2018-09-08 LAB — CBC
HCT: 32.2 % — ABNORMAL LOW (ref 36.0–46.0)
Hemoglobin: 9.6 g/dL — ABNORMAL LOW (ref 12.0–15.0)
MCH: 23.9 pg — AB (ref 26.0–34.0)
MCHC: 29.8 g/dL — ABNORMAL LOW (ref 30.0–36.0)
MCV: 80.3 fL (ref 80.0–100.0)
Platelets: 382 10*3/uL (ref 150–400)
RBC: 4.01 MIL/uL (ref 3.87–5.11)
RDW: 18.8 % — ABNORMAL HIGH (ref 11.5–15.5)
WBC: 14.7 10*3/uL — ABNORMAL HIGH (ref 4.0–10.5)
nRBC: 0 % (ref 0.0–0.2)

## 2018-09-08 LAB — BASIC METABOLIC PANEL
Anion gap: 7 (ref 5–15)
BUN: 25 mg/dL — ABNORMAL HIGH (ref 8–23)
CALCIUM: 8.7 mg/dL — AB (ref 8.9–10.3)
CO2: 25 mmol/L (ref 22–32)
Chloride: 113 mmol/L — ABNORMAL HIGH (ref 98–111)
Creatinine, Ser: 1.05 mg/dL — ABNORMAL HIGH (ref 0.44–1.00)
GFR calc Af Amer: >60 mL/min (ref >60)
GFR calc non Af Amer: 55 mL/min — ABNORMAL LOW (ref >60)
Glucose, Bld: 115 mg/dL — ABNORMAL HIGH (ref 70–99)
Potassium: 3.5 mmol/L (ref 3.5–5.1)
Sodium: 145 mmol/L (ref 135–145)

## 2018-09-08 MED ORDER — CARVEDILOL 3.125 MG PO TABS
6.2500 mg | ORAL_TABLET | Freq: Two times a day (BID) | ORAL | Status: DC
  Administered 2018-09-08 – 2018-09-10 (×5): 6.25 mg via ORAL
  Filled 2018-09-08: qty 1
  Filled 2018-09-08: qty 2
  Filled 2018-09-08: qty 1
  Filled 2018-09-08 (×2): qty 2

## 2018-09-08 MED ORDER — FUROSEMIDE 20 MG PO TABS
10.0000 mg | ORAL_TABLET | Freq: Every day | ORAL | Status: DC
  Administered 2018-09-08 – 2018-09-10 (×3): 10 mg via ORAL
  Filled 2018-09-08 (×3): qty 1

## 2018-09-08 MED ORDER — AMLODIPINE BESYLATE 10 MG PO TABS
10.0000 mg | ORAL_TABLET | Freq: Every day | ORAL | Status: DC
  Administered 2018-09-08 – 2018-09-10 (×3): 10 mg via ORAL
  Filled 2018-09-08 (×3): qty 1

## 2018-09-08 MED ORDER — AZITHROMYCIN 500 MG PO TABS
500.0000 mg | ORAL_TABLET | Freq: Once | ORAL | Status: AC
  Administered 2018-09-09: 500 mg via ORAL
  Filled 2018-09-08: qty 1

## 2018-09-08 MED ORDER — NOREPINEPHRINE-SODIUM CHLORIDE 4-0.9 MG/250ML-% IV SOLN: 0.0000 ug/min | INTRAVENOUS | Status: DC

## 2018-09-08 MED ORDER — PREDNISONE 20 MG PO TABS: 20.0000 mg | ORAL_TABLET | Freq: Every day | ORAL | Status: DC

## 2018-09-08 MED ORDER — PREDNISONE 20 MG PO TABS
30.0000 mg | ORAL_TABLET | Freq: Every day | ORAL | Status: AC
  Administered 2018-09-10: 30 mg via ORAL
  Filled 2018-09-08: qty 1

## 2018-09-08 MED ORDER — PREDNISONE 20 MG PO TABS
40.0000 mg | ORAL_TABLET | Freq: Every day | ORAL | Status: AC
  Administered 2018-09-09: 40 mg via ORAL
  Filled 2018-09-08: qty 2

## 2018-09-08 MED ORDER — ASPIRIN 81 MG PO CHEW
81.0000 mg | CHEWABLE_TABLET | Freq: Every day | ORAL | Status: DC
  Administered 2018-09-08 – 2018-09-10 (×3): 81 mg via ORAL
  Filled 2018-09-08 (×3): qty 1

## 2018-09-08 MED ORDER — BOOST / RESOURCE BREEZE PO LIQD CUSTOM
1.0000 | Freq: Three times a day (TID) | ORAL | Status: DC
  Administered 2018-09-08 – 2018-09-10 (×4): 1 via ORAL

## 2018-09-08 MED ORDER — METHYLPREDNISOLONE SODIUM SUCC 125 MG IJ SOLR
60.0000 mg | Freq: Two times a day (BID) | INTRAMUSCULAR | Status: AC
  Administered 2018-09-08: 60 mg via INTRAVENOUS
  Filled 2018-09-08: qty 2

## 2018-09-08 MED ORDER — MIRTAZAPINE 15 MG PO TABS
7.5000 mg | ORAL_TABLET | Freq: Every day | ORAL | Status: DC
  Administered 2018-09-08 – 2018-09-09 (×2): 7.5 mg via ORAL
  Filled 2018-09-08 (×2): qty 1

## 2018-09-08 MED ORDER — ADULT MULTIVITAMIN W/MINERALS CH
1.0000 | ORAL_TABLET | Freq: Every day | ORAL | Status: DC
  Administered 2018-09-08 – 2018-09-10 (×3): 1 via ORAL
  Filled 2018-09-08 (×3): qty 1

## 2018-09-08 MED ORDER — PREDNISONE 10 MG PO TABS: 10.0000 mg | ORAL_TABLET | Freq: Every day | ORAL | Status: DC

## 2018-09-08 MED ORDER — FUROSEMIDE 10 MG/ML IJ SOLN
20.0000 mg | Freq: Once | INTRAMUSCULAR | Status: AC
  Administered 2018-09-08: 20 mg via INTRAVENOUS
  Filled 2018-09-08: qty 2

## 2018-09-08 MED ORDER — ATORVASTATIN CALCIUM 10 MG PO TABS
10.0000 mg | ORAL_TABLET | Freq: Every day | ORAL | Status: DC
  Administered 2018-09-08 – 2018-09-09 (×2): 10 mg via ORAL
  Filled 2018-09-08 (×2): qty 1

## 2018-09-08 MED ORDER — PANTOPRAZOLE SODIUM 40 MG PO TBEC
40.0000 mg | DELAYED_RELEASE_TABLET | Freq: Two times a day (BID) | ORAL | Status: DC
  Administered 2018-09-08 – 2018-09-10 (×4): 40 mg via ORAL
  Filled 2018-09-08 (×5): qty 1

## 2018-09-08 MED ORDER — SUCRALFATE 1 G PO TABS
1.0000 g | ORAL_TABLET | Freq: Three times a day (TID) | ORAL | Status: DC
  Administered 2018-09-09 – 2018-09-10 (×6): 1 g via ORAL
  Filled 2018-09-08 (×6): qty 1

## 2018-09-08 NOTE — Treatment Plan (Signed)
Extubated yesterday without sequela.  She has passed swallowing evaluation.  Does not complain of dyspnea above her norm.  Tolerating high flow O2.  Was discussed during multidisciplinary rounds.  She is stable for transfer to [facility] floor.  Recommend keeping oxygen saturations between 88 to 92%.

## 2018-09-08 NOTE — Progress Notes (Signed)
Pt was 98% on 8, dropped O2 from 8L to 6L

## 2018-09-08 NOTE — Evaluation (Signed)
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 PMH of CHF, CKD II, COPD, HLD, HTN.    Clinical Impression  Patient A&Ox4 agreeable to PT, behavior WFLs. Pt reported living with husband and grandchild in one story home, previously independent (though handheld assist in tub/shower for safety.)   The patient demonstrated bed mobility with supervision, able to sit EOB on 10L on HFNC, mild desat into low 80s, able to recover with cues for deep breathing and time. Sit <> stand with handheld assist (pt with use of chair arm), ambulated to chair ~7ft. Exhibited LE fatigue/shakiness, poor endurance noted. Pt and RN advised to use RW for further mobility to ensure safety. Pt again mild desat after mobility, improved with time. Overall the patient demonstrated limitations (see "PT Problem List") that impede the patient's functional activity, mobility, and safety and would benefit from skilled PT intervention. Recommendation is STR due to current mobility status and safety concerns.     Follow Up Recommendations SNF    Equipment Recommendations  Other (comment)(TBD, pt has RW, 4WW, and SPC at home)    Recommendations for Other Services       Precautions / Restrictions Precautions Precautions: Fall Restrictions Weight Bearing Restrictions: No      Mobility  Bed Mobility Overal bed mobility: Needs Assistance Bed Mobility: Supine to Sit     Supine to sit: Supervision;HOB elevated     General bed mobility comments: Able to sit EOB for several minutes with fair balance. Occasional cues for PLB to maintain O2 sats.  Transfers Overall transfer level: Needs assistance Equipment used: 1 person hand held assist Transfers:  Sit to/from Stand Sit to Stand: Min assist         General transfer comment: Pt also reached for chair arm for transfer + handheld assist. Some LE shakiness evident but no LOB.   Ambulation/Gait   Gait Distance (Feet): 3 Feet Assistive device: 1 person hand held assist Gait Pattern/deviations: Shuffle Gait velocity: decreased      Stairs            Wheelchair Mobility    Modified Rankin (Stroke Patients Only)       Balance Overall balance assessment: Needs assistance   Sitting balance-Leahy Scale: Fair       Standing balance-Leahy Scale: Poor                               Pertinent Vitals/Pain Pain Assessment: No/denies pain    Home Living Family/patient expects to be discharged to:: Private residence Living Arrangements: Spouse/significant other;Other (Comment)(7 yo grandson) Available Help at Discharge: Family Type of Home: House Home Access: Level entry     Home Layout: One level Home Equipment: Environmental consultant - 2 wheels;Walker - 4 wheels;Shower seat;Cane - single point;Other (comment)(oxygen at 2L last couple of months 3L)      Prior Function Level of Independence: Independent         Comments: Pt reported no falls in the last 6 months. SPC for ambulation, handheld assist to transfer into tub from husband.      Hand Dominance        Extremity/Trunk Assessment   Upper Extremity Assessment Upper Extremity Assessment: Overall WFL for tasks  assessed(grossly 4/5)    Lower Extremity Assessment Lower Extremity Assessment: Generalized weakness(grossly 4-/5)    Cervical / Trunk Assessment Cervical / Trunk Assessment: Normal  Communication   Communication: No difficulties  Cognition Arousal/Alertness: Awake/alert Behavior During Therapy: WFL for tasks assessed/performed Overall Cognitive Status: Within Functional Limits for tasks assessed                                        General Comments      Exercises      Assessment/Plan    PT Assessment Patient needs continued PT services  PT Problem List Decreased strength;Decreased activity tolerance;Decreased balance;Decreased mobility;Decreased knowledge of precautions;Decreased safety awareness;Decreased knowledge of use of DME;Decreased range of motion       PT Treatment Interventions DME instruction;Therapeutic exercise;Gait training;Balance training;Stair training;Neuromuscular re-education;Functional mobility training;Therapeutic activities;Patient/family education    PT Goals (Current goals can be found in the Care Plan section)  Acute Rehab PT Goals Patient Stated Goal: Pt wants to go home PT Goal Formulation: With patient Time For Goal Achievement: 09/22/18 Potential to Achieve Goals: Good    Frequency Min 2X/week   Barriers to discharge        Co-evaluation               AM-PAC PT "6 Clicks" Mobility  Outcome Measure Help needed turning from your back to your side while in a flat bed without using bedrails?: A Little Help needed moving from lying on your back to sitting on the side of a flat bed without using bedrails?: A Little Help needed moving to and from a bed to a chair (including a wheelchair)?: A Little Help needed standing up from a chair using your arms (e.g., wheelchair or bedside chair)?: A Little Help needed to walk in hospital room?: A Lot Help needed climbing 3-5 steps with a railing? : Total 6 Click Score: 15    End of Session Equipment Utilized During Treatment: Gait belt Activity Tolerance: Patient limited by fatigue;Treatment limited secondary to medical complications (Comment);Other (comment)(oxygen saturation) Patient left: with chair alarm set;in chair;with nursing/sitter in room;with SCD's reapplied;with call bell/phone within reach Nurse Communication: Mobility status PT Visit Diagnosis: Unsteadiness on feet (R26.81);Other abnormalities of gait and mobility (R26.89);Muscle weakness (generalized)  (M62.81);Difficulty in walking, not elsewhere classified (R26.2)    Time: 1583-0940 PT Time Calculation (min) (ACUTE ONLY): 32 min   Charges:   PT Evaluation $PT Eval High Complexity: 1 High PT Treatments $Therapeutic Activity: 8-22 mins       Lieutenant Diego PT, DPT 2:43 PM,09/08/18 [phone]

## 2018-09-08 NOTE — Progress Notes (Signed)
Nutrition Follow Up Note   DOCUMENTATION CODES:   Non-severe (moderate) malnutrition in context of chronic illness  INTERVENTION:   Boost Breeze po TID, each supplement provides 250 kcal and 9 grams of protein  MVI daily   Recommend liberal diet   NUTRITION DIAGNOSIS:   Moderate Malnutrition related to chronic illness(COPD, CHF, CKD) as evidenced by moderate fat depletion, mild-moderate muscle depletion.  GOAL:   Patient will meet greater than or equal to 90% of their needs  MONITOR:   PO intake, Supplement acceptance, Labs, Weight trends, Skin, I & O's  ASSESSMENT:   68 year old female with PMHx of CKD stage II, hypothyroidism, HLD, HTN, CHF, COPD admitted with severe COPD exacerbation requiring intubation on 1/7.   Pt extubated 1/9; tolerating well. Pt advanced to a heart healthy diet. Pt ate 30% of her breakfast today. Pt does have dentures. Pt does not like "milky supplements" as she reports they give her diarrhea. Pt is willing to try Boost Breeze. RD will add supplements and MVI to help pt meet her estimated needs. Per chart, pt is weight stable since admit.    Medications reviewed and include: aspirin, azithromycin, lasix, synthroid, mirtazapine, protonix, prednisone, carafate  Labs reviewed: BUN 25(H), creat 1.05(H) P 3.0 wnl, Mg 1.8 wnl- 1/9 Wbc- 14.7(H), Hgb 9.6(L), Hct 32.2(L)  Diet Order:   Diet Order            Diet Heart Room service appropriate? Yes; Fluid consistency: Thin  Diet effective now             EDUCATION NEEDS:   Not appropriate for education at this time  Skin:  Skin Assessment: Reviewed RN Assessment  Last BM:  pta  Height:   Ht Readings from Last 1 Encounters:  09/05/18 5\' 4"  (1.626 m)   Weight:   Wt Readings from Last 1 Encounters:  09/08/18 68.4 kg   Ideal Body Weight:  54.5 kg  BMI:  Body mass index is 25.88 kg/m.  Estimated Nutritional Needs:   Kcal:  1500-1700kcal/day   Protein:  70-82g/day   Fluid:   >1.4L/day   Koleen Distance MS, RD, LDN Pager #- [phone] Office#- [phone] After Hours Pager: [phone]

## 2018-09-08 NOTE — Progress Notes (Signed)
Patient alert and oriented. HF 10 liters. No complaints of pain or shortness of breath. Diet ordered and tolerating well. Foley removed. PT worked with patient this afternoon.

## 2018-09-08 NOTE — Progress Notes (Signed)
Patient ID: Melissa Knox, female   DOB: 07/31/1951, 68 y.o.   MRN: [id number]  [employer] PROGRESS NOTE  Melissa Knox [id number]:[id number] DOB: [birth year] DOA: 09/05/2018 PCP: Ronnell Freshwater, NP  HPI/Subjective: Patient feels okay.  Offers no complaints.  States her breathing is okay.  Some cough.  Normally she wears 3 L of oxygen.   Objective: Vitals:   09/08/18 1300 09/08/18 1330  BP: (!) 164/70 138/63  Pulse: (!) 108 85  Resp: (!) 21 16  Temp: 99.3 F (37.4 C) 99.3 F (37.4 C)  SpO2: 94% 97%    Filed Weights   09/05/18 1443 09/07/18 0500 09/08/18 0500  Weight: 68.2 kg 67.3 kg 68.4 kg    ROS: Review of Systems  Constitutional: Negative for chills and fever.  Eyes: Negative for blurred vision.  Respiratory: Positive for shortness of breath. Negative for cough.   Cardiovascular: Negative for chest pain.  Gastrointestinal: Negative for abdominal pain, constipation, diarrhea, nausea and vomiting.  Genitourinary: Negative for dysuria.  Musculoskeletal: Negative for joint pain.  Neurological: Negative for dizziness and headaches.   Exam: Physical Exam  Constitutional: She is oriented to person, place, and time.  HENT:  Nose: No mucosal edema.  Mouth/Throat: No oropharyngeal exudate or posterior oropharyngeal edema.  Eyes: Pupils are equal, round, and reactive to light. Conjunctivae, EOM and lids are normal.  Neck: No JVD present. Carotid bruit is not present. No edema present. No thyroid mass and no thyromegaly present.  Cardiovascular: S1 normal and S2 normal. Exam reveals no gallop.  No murmur heard. Pulses:      Dorsalis pedis pulses are 2+ on the right side and 2+ on the left side.  Respiratory: No respiratory distress. She has decreased breath sounds in the right lower field and the left lower field. She has no wheezes. She has rhonchi in the right lower field and the left lower field. She has no rales.  GI: Soft. Bowel sounds are normal. There is no abdominal  tenderness.  Musculoskeletal:     Right ankle: She exhibits no swelling.     Left ankle: She exhibits no swelling.  Lymphadenopathy:    She has no cervical adenopathy.  Neurological: She is alert and oriented to person, place, and time.  Able to wiggle toes  Skin: Skin is warm. No rash noted. Nails show no clubbing.  Psychiatric: She has a normal mood and affect.      Data Reviewed: Basic Metabolic Panel: Recent Labs  Lab 09/05/18 0648 09/06/18 0614 09/07/18 0633 09/08/18 0340  NA 141 141 141 145  K 3.4* 3.7 3.5 3.5  CL 106 111 112* 113*  CO2 26 21* 23 25  GLUCOSE 110* 139* 181* 115*  BUN _0 25*  CREATININE 1.31* 1.31* 1.12* 1.05*  CALCIUM 8.2* 8.1* 8.3* 8.7*  MG  --   --  1.8  --   PHOS  --   --  3.0  --    Liver Function Tests: Recent Labs  Lab 09/05/18 0648  AST 17  ALT 9  ALKPHOS 66  BILITOT 0.6  PROT 7.8  ALBUMIN 2.9*   CBC: Recent Labs  Lab 09/05/18 0648 09/06/18 0614 09/07/18 0633 09/08/18 0340  WBC 14.5* 8.2 16.3* 14.7*  NEUTROABS 10.7*  --   --   --   HGB 6.0* 9.0* 9.5* 9.6*  HCT 21.4* 29.8* 31.1* 32.2*  MCV 77.0* 79.3* 78.9* 80.3  PLT 625* 448* 402* 382   Cardiac Enzymes: Recent Labs  Lab  09/05/18 0648  TROPONINI 0.04*   BNP (last 3 results) Recent Labs    04/11/18 0751 06/03/18 0405 09/05/18 0648  BNP 328.0* 369.0* 431.0*     CBG: Recent Labs  Lab 09/07/18 1935 09/08/18 0030 09/08/18 0349 09/08/18 0732 09/08/18 1138  GLUCAP 112* 99 115* 111* 95    Recent Results (from the past 240 hour(s))  Blood Culture (routine x 2)     Status: None (Preliminary result)   Collection Time: 09/05/18  6:48 AM  Result Value Ref Range Status   Specimen Description BLOOD LEFT ANTECUBITAL  Final   Special Requests   Final    BOTTLES DRAWN AEROBIC AND ANAEROBIC Blood Culture results may not be optimal due to an excessive volume of blood received in culture bottles   Culture   Final    NO GROWTH 3 DAYS Performed at [facility], [address], [place], [place] [address]    Report Status PENDING  Incomplete  MRSA PCR Screening     Status: None   Collection Time: 09/05/18  2:46 PM  Result Value Ref Range Status   MRSA by PCR NEGATIVE NEGATIVE Final    Comment:        The GeneXpert MRSA Assay (FDA approved for NASAL specimens only), is one component of a comprehensive MRSA colonization surveillance program. It is not intended to diagnose MRSA infection nor to guide or monitor treatment for MRSA infections. Performed at [facility], [address] [place], [place] [address]       Scheduled Meds: . amLODipine  10 mg Oral Daily  . aspirin  81 mg Oral Daily  . atorvastatin  10 mg Oral q1800  . [START ON 09/09/2018] azithromycin  500 mg Oral Once  . budesonide (PULMICORT) nebulizer solution  0.5 mg Nebulization BID  . carvedilol  6.25 mg Oral BID WC  . chlorhexidine gluconate (MEDLINE KIT)  15 mL Mouth Rinse BID  . fentaNYL (SUBLIMAZE) injection  200 mcg Intravenous Once  . furosemide  10 mg Oral Daily  . ipratropium-albuterol  3 mL Nebulization Q4H  . levothyroxine  50 mcg Oral QAC breakfast  . mouth rinse  15 mL Mouth Rinse 10 times per day  . mirtazapine  7.5 mg Oral QHS  . pantoprazole  40 mg Oral BID  . [START ON 09/09/2018] predniSONE  40 mg Oral Q breakfast   Followed by  . [START ON 09/10/2018] predniSONE  30 mg Oral Q breakfast   Followed by  . [START ON 09/11/2018] predniSONE  20 mg Oral Q breakfast   Followed by  . [START ON 09/12/2018] predniSONE  10 mg Oral Q breakfast  . [START ON 09/09/2018] sucralfate  1 g Oral TID WC & HS     Assessment/Plan:  1. Acute hypoxic respiratory failure.  Patient on 4 L nasal cannula.  2. COPD exacerbation and pneumonia.  Patient on prednisone tapering prednisone. 3. Iron deficiency anemia.  Status post 2 units 4. History of diastolic congestive heart failure.  No signs currently 5. Hypothyroidism unspecified on  levothyroxine 6. Hyperlipidemia unspecified 7. Essential hypertension  Code Status:     Code Status Orders  (From admission, onward)         Start     Ordered   09/05/18 0817  Full code  Continuous     09/05/18 0816        Code Status History    Date Active Date Inactive Code Status Order ID Comments User Context  06/03/2018 0955 06/08/2018 1838 Partial Code [id number]  Demetrios Loll, MD Inpatient   06/03/2018 0757 06/03/2018 0955 Full Code [id number]  Harrie Foreman, MD Inpatient   04/11/2018 1256 04/14/2018 1807 Full Code [id number]  Vaughan Basta, MD ED   05/29/2017 1024 05/30/2017 1819 DNR [id number]  Hillary Bow, MD Inpatient   05/28/2017 2323 05/29/2017 1024 Full Code [id number]  Quintella Baton, MD Inpatient   02/06/2017 1525 02/08/2017 1404 Full Code [id number]  Idelle Crouch, MD ED     Family Communication: As per critical care team Disposition Plan: Likely out to the floor today  Consultants:  Critical care specialist  Antibiotics:  Zithromax only  Time spent: 27 minutes  [place]

## 2018-09-09 ENCOUNTER — Other Ambulatory Visit: Payer: Self-pay

## 2018-09-09 LAB — GLUCOSE, CAPILLARY
Glucose-Capillary: 104 mg/dL — ABNORMAL HIGH (ref 70–99)
Glucose-Capillary: 108 mg/dL — ABNORMAL HIGH (ref 70–99)
Glucose-Capillary: 84 mg/dL (ref 70–99)
Glucose-Capillary: 108 mg/dL — ABNORMAL HIGH (ref 70–99)
Glucose-Capillary: 130 mg/dL — ABNORMAL HIGH (ref 70–99)
Glucose-Capillary: 129 mg/dL — ABNORMAL HIGH (ref 70–99)

## 2018-09-09 MED ORDER — IPRATROPIUM-ALBUTEROL 0.5-2.5 (3) MG/3ML IN SOLN
3.0000 mL | Freq: Four times a day (QID) | RESPIRATORY_TRACT | Status: DC
  Administered 2018-09-09 (×2): 3 mL via RESPIRATORY_TRACT
  Filled 2018-09-09 (×2): qty 3

## 2018-09-09 NOTE — Progress Notes (Signed)
Patient ID: Melissa Knox, female   DOB: September 14, 1950, 68 y.o.   MRN: [id number]  [employer] PROGRESS NOTE  Melissa Knox WUJ:[id number] DOB: 1951/07/07 DOA: 09/05/2018 PCP: Ronnell Freshwater, NP  HPI/Subjective: Feeling better, transferred from ICU.  She wants to ambulate today.  No complaints..   Objective: Vitals:   09/09/18 0457 09/09/18 0840  BP:  (!) 158/68  Pulse:  81  Resp:  18  Temp:  97.9 F (36.6 C)  SpO2: 96% 97%    Filed Weights   09/07/18 0500 09/08/18 0500 09/09/18 0500  Weight: 67.3 kg 68.4 kg 68.5 kg    ROS: Review of Systems  Constitutional: Negative for chills and fever.  Eyes: Negative for blurred vision.  Respiratory: Positive for shortness of breath. Negative for cough.   Cardiovascular: Negative for chest pain.  Gastrointestinal: Negative for abdominal pain, constipation, diarrhea, nausea and vomiting.  Genitourinary: Negative for dysuria.  Musculoskeletal: Negative for joint pain.  Neurological: Negative for dizziness and headaches.   Exam: Physical Exam  Constitutional: She is oriented to person, place, and time.  HENT:  Nose: No mucosal edema.  Mouth/Throat: No oropharyngeal exudate or posterior oropharyngeal edema.  Eyes: Pupils are equal, round, and reactive to light. Conjunctivae, EOM and lids are normal.  Neck: No JVD present. Carotid bruit is not present. No edema present. No thyroid mass and no thyromegaly present.  Cardiovascular: S1 normal and S2 normal. Exam reveals no gallop.  No murmur heard. Pulses:      Dorsalis pedis pulses are 2+ on the right side and 2+ on the left side.  Respiratory: No respiratory distress. She has no wheezes. She has no rhonchi. She has no rales.  GI: Soft. Bowel sounds are normal. There is no abdominal tenderness.  Musculoskeletal:     Right ankle: She exhibits no swelling.     Left ankle: She exhibits no swelling.  Lymphadenopathy:    She has no cervical adenopathy.  Neurological: She is alert and  oriented to person, place, and time.  Able to wiggle toes  Skin: Skin is warm. No rash noted. Nails show no clubbing.  Psychiatric: She has a normal mood and affect.      Data Reviewed: Basic Metabolic Panel: Recent Labs  Lab 09/05/18 0648 09/06/18 0614 09/07/18 0633 09/08/18 0340  NA 141 141 141 145  K 3.4* 3.7 3.5 3.5  CL 106 111 112* 113*  CO2 26 21* 23 25  GLUCOSE 110* 139* 181* 115*  BUN _0 25*  CREATININE 1.31* 1.31* 1.12* 1.05*  CALCIUM 8.2* 8.1* 8.3* 8.7*  MG  --   --  1.8  --   PHOS  --   --  3.0  --    Liver Function Tests: Recent Labs  Lab 09/05/18 0648  AST 17  ALT 9  ALKPHOS 66  BILITOT 0.6  PROT 7.8  ALBUMIN 2.9*   CBC: Recent Labs  Lab 09/05/18 0648 09/06/18 0614 09/07/18 0633 09/08/18 0340  WBC 14.5* 8.2 16.3* 14.7*  NEUTROABS 10.7*  --   --   --   HGB 6.0* 9.0* 9.5* 9.6*  HCT 21.4* 29.8* 31.1* 32.2*  MCV 77.0* 79.3* 78.9* 80.3  PLT 625* 448* 402* 382   Cardiac Enzymes: Recent Labs  Lab 09/05/18 0648  TROPONINI 0.04*   BNP (last 3 results) Recent Labs    04/11/18 0751 06/03/18 0405 09/05/18 0648  BNP 328.0* 369.0* 431.0*     CBG: Recent Labs  Lab 09/08/18 1632 09/08/18  2116 09/09/18 0201 09/09/18 0522 09/09/18 0842  GLUCAP 128* 127* 104* 108* 84    Recent Results (from the past 240 hour(s))  Blood Culture (routine x 2)     Status: None (Preliminary result)   Collection Time: 09/05/18  6:48 AM  Result Value Ref Range Status   Specimen Description BLOOD LEFT ANTECUBITAL  Final   Special Requests   Final    BOTTLES DRAWN AEROBIC AND ANAEROBIC Blood Culture results may not be optimal due to an excessive volume of blood received in culture bottles   Culture   Final    NO GROWTH 4 DAYS Performed at [facility], [address] [place], [place] [address]    Report Status PENDING  Incomplete  MRSA PCR Screening     Status: None   Collection Time: 09/05/18  2:46 PM  Result Value Ref Range Status    MRSA by PCR NEGATIVE NEGATIVE Final    Comment:        The GeneXpert MRSA Assay (FDA approved for NASAL specimens only), is one component of a comprehensive MRSA colonization surveillance program. It is not intended to diagnose MRSA infection nor to guide or monitor treatment for MRSA infections. Performed at [facility], [address] [place], [place] [address]       Scheduled Meds: . amLODipine  10 mg Oral Daily  . aspirin  81 mg Oral Daily  . atorvastatin  10 mg Oral q1800  . azithromycin  500 mg Oral Once  . budesonide (PULMICORT) nebulizer solution  0.5 mg Nebulization BID  . carvedilol  6.25 mg Oral BID WC  . chlorhexidine gluconate (MEDLINE KIT)  15 mL Mouth Rinse BID  . feeding supplement  1 Container Oral TID BM  . fentaNYL (SUBLIMAZE) injection  200 mcg Intravenous Once  . furosemide  10 mg Oral Daily  . ipratropium-albuterol  3 mL Nebulization Q6H  . levothyroxine  50 mcg Oral QAC breakfast  . mirtazapine  7.5 mg Oral QHS  . multivitamin with minerals  1 tablet Oral Daily  . pantoprazole  40 mg Oral BID  . [START ON 09/10/2018] predniSONE  30 mg Oral Q breakfast   Followed by  . [START ON 09/11/2018] predniSONE  20 mg Oral Q breakfast   Followed by  . [START ON 09/12/2018] predniSONE  10 mg Oral Q breakfast  . sucralfate  1 g Oral TID WC & HS     Assessment/Plan:  1. Acute on chronic hypoxic respiratory failure.  Will wean off oxygen to 3 L that she has at home. 2. COPD exacerbation and pneumonia.  Patient on prednisone tapering prednisone.  Continue bronchodilators, clinically improving. 3. Iron deficiency anemia.  Status post 2 units of PRBC transfusion, hemoglobin improved from 6-9.6.,  Baseline hemoglobin is around 7.9. 4. History of diastolic congestive heart failure.  No signs currently 5. Hypothyroidism unspecified on levothyroxine 6. Hyperlipidemia unspecified 7. Essential hypertension; controlled.  Code Status:     Code Status Orders   (From admission, onward)         Start     Ordered   09/05/18 0817  Full code  Continuous     09/05/18 0816        Code Status History    Date Active Date Inactive Code Status Order ID Comments User Context   06/03/2018 0955 06/08/2018 1838 Partial Code [id number]  Demetrios Loll, MD Inpatient   06/03/2018 0757 06/03/2018 0955 Full Code [id number]  Harrie Foreman, MD  Inpatient   04/11/2018 1256 04/14/2018 1807 Full Code [id number]  Vachhani, Vaibhavkumar, MD ED   05/29/2017 1024 05/30/2017 1819 DNR [id number]  Sudini, Srikar, MD Inpatient   05/28/2017 2323 05/29/2017 1024 Full Code [id number]  Crosley, Debby, MD Inpatient   02/06/2017 1525 02/08/2017 1404 Full Code [id number]  Sparks, Jeffrey D, MD ED     Family Communication: As per critical care team Disposition Plan: Likely out to the floor today  Consultants:  Critical care specialist  Antibiotics:  Zithromax only  Time spent: 27 minutes  Snehalatha Konidena  [employer]            

## 2018-09-09 NOTE — Plan of Care (Signed)
Discussed with patient plan of care for the evening, pain management and admission questions with some teach back displayed

## 2018-09-10 LAB — GLUCOSE, CAPILLARY
GLUCOSE-CAPILLARY: 84 mg/dL (ref 70–99)
Glucose-Capillary: 115 mg/dL — ABNORMAL HIGH (ref 70–99)
Glucose-Capillary: 129 mg/dL — ABNORMAL HIGH (ref 70–99)
Glucose-Capillary: 97 mg/dL (ref 70–99)

## 2018-09-10 LAB — CULTURE, BLOOD (ROUTINE X 2): Culture: NO GROWTH

## 2018-09-10 MED ORDER — IPRATROPIUM-ALBUTEROL 0.5-2.5 (3) MG/3ML IN SOLN: 3.0000 mL | Freq: Four times a day (QID) | RESPIRATORY_TRACT | Status: DC | PRN

## 2018-09-10 MED ORDER — IPRATROPIUM-ALBUTEROL 0.5-2.5 (3) MG/3ML IN SOLN
3.0000 mL | Freq: Three times a day (TID) | RESPIRATORY_TRACT | Status: DC
  Administered 2018-09-10: 3 mL via RESPIRATORY_TRACT
  Filled 2018-09-10 (×2): qty 3

## 2018-09-10 MED ORDER — PREDNISONE 10 MG (21) PO TBPK: ORAL_TABLET | ORAL | 0 refills | Status: DC

## 2018-09-10 NOTE — Progress Notes (Signed)
VSS. PIVs removed. Husband at bedside. Discharge instructions gone over with patient at this time. No concerns voiced at this time. All belongings gathered. Nurse escorted pt to car via wc.  Bethann Punches, RN

## 2018-09-10 NOTE — Clinical Social Work Note (Signed)
CSW met with pt at bedside. CSW explained to the pt that PT recommended SNF for short term rehabilitation.  Pt declined SNF.  Pt stated that she has supports at home from family, daughters that help her with day to day tasks.  CSW discussed w/pt possibility of home health care.  Pt declined and stated that "I still drive. I want to be as independent as long as I possibly can."   CSW is signing off.  Please consult if needs arise.

## 2018-09-10 NOTE — Progress Notes (Signed)
Patient ID: Melissa Knox, female   DOB: 02-19-1951, 68 y.o.   MRN: [id number]  [employer] PROGRESS NOTE  Melissa Knox YKZ:[id number] DOB: [birth year] DOA: 09/05/2018 PCP: Ronnell Freshwater, NP  HPI/Subjective: No shortness of breath.   Objective: Vitals:   09/09/18 2319 09/10/18 0755  BP: 140/60 138/74  Pulse: 74 69  Resp: 18 18  Temp: (!) 97.3 F (36.3 C) 98.1 F (36.7 C)  SpO2: 94% 98%    Filed Weights   09/08/18 0500 09/09/18 0500 09/10/18 0552  Weight: 68.4 kg 68.5 kg 66.9 kg    ROS: Review of Systems  Constitutional: Negative for chills and fever.  Eyes: Negative for blurred vision.  Respiratory: Positive for shortness of breath. Negative for cough.   Cardiovascular: Negative for chest pain.  Gastrointestinal: Negative for abdominal pain, constipation, diarrhea, nausea and vomiting.  Genitourinary: Negative for dysuria.  Musculoskeletal: Negative for joint pain.  Neurological: Negative for dizziness and headaches.   Exam: Physical Exam  Constitutional: She is oriented to person, place, and time.  HENT:  Nose: No mucosal edema.  Mouth/Throat: No oropharyngeal exudate or posterior oropharyngeal edema.  Eyes: Pupils are equal, round, and reactive to light. Conjunctivae, EOM and lids are normal.  Neck: No JVD present. Carotid bruit is not present. No edema present. No thyroid mass and no thyromegaly present.  Cardiovascular: S1 normal and S2 normal. Exam reveals no gallop.  No murmur heard. Pulses:      Dorsalis pedis pulses are 2+ on the right side and 2+ on the left side.  Respiratory: No respiratory distress. She has no wheezes. She has no rhonchi. She has no rales.  GI: Soft. Bowel sounds are normal. There is no abdominal tenderness.  Musculoskeletal:     Right ankle: She exhibits no swelling.     Left ankle: She exhibits no swelling.  Lymphadenopathy:    She has no cervical adenopathy.  Neurological: She is alert and oriented to person, place, and  time.  Able to wiggle toes  Skin: Skin is warm. No rash noted. Nails show no clubbing.  Psychiatric: She has a normal mood and affect.      Data Reviewed: Basic Metabolic Panel: Recent Labs  Lab 09/05/18 0648 09/06/18 0614 09/07/18 0633 09/08/18 0340  NA 141 141 141 145  K 3.4* 3.7 3.5 3.5  CL 106 111 112* 113*  CO2 26 21* 23 25  GLUCOSE 110* 139* 181* 115*  BUN _0 25*  CREATININE 1.31* 1.31* 1.12* 1.05*  CALCIUM 8.2* 8.1* 8.3* 8.7*  MG  --   --  1.8  --   PHOS  --   --  3.0  --    Liver Function Tests: Recent Labs  Lab 09/05/18 0648  AST 17  ALT 9  ALKPHOS 66  BILITOT 0.6  PROT 7.8  ALBUMIN 2.9*   CBC: Recent Labs  Lab 09/05/18 0648 09/06/18 0614 09/07/18 0633 09/08/18 0340  WBC 14.5* 8.2 16.3* 14.7*  NEUTROABS 10.7*  --   --   --   HGB 6.0* 9.0* 9.5* 9.6*  HCT 21.4* 29.8* 31.1* 32.2*  MCV 77.0* 79.3* 78.9* 80.3  PLT 625* 448* 402* 382   Cardiac Enzymes: Recent Labs  Lab 09/05/18 0648  TROPONINI 0.04*   BNP (last 3 results) Recent Labs    04/11/18 0751 06/03/18 0405 09/05/18 0648  BNP 328.0* 369.0* 431.0*     CBG: Recent Labs  Lab 09/09/18 1641 09/09/18 2007 09/10/18 0001 09/10/18 0415 09/10/18 0756  GLUCAP 130* 129* 129* 84 115*    Recent Results (from the past 240 hour(s))  Blood Culture (routine x 2)     Status: None   Collection Time: 09/05/18  6:48 AM  Result Value Ref Range Status   Specimen Description BLOOD LEFT ANTECUBITAL  Final   Special Requests   Final    BOTTLES DRAWN AEROBIC AND ANAEROBIC Blood Culture results may not be optimal due to an excessive volume of blood received in culture bottles   Culture   Final    NO GROWTH 5 DAYS Performed at [facility], [address] [place], [place] [address]    Report Status 09/10/2018 FINAL  Final  MRSA PCR Screening     Status: None   Collection Time: 09/05/18  2:46 PM  Result Value Ref Range Status   MRSA by PCR NEGATIVE NEGATIVE Final    Comment:         The GeneXpert MRSA Assay (FDA approved for NASAL specimens only), is one component of a comprehensive MRSA colonization surveillance program. It is not intended to diagnose MRSA infection nor to guide or monitor treatment for MRSA infections. Performed at [facility], [address] [place], [place] [address]       Scheduled Meds: . amLODipine  10 mg Oral Daily  . aspirin  81 mg Oral Daily  . atorvastatin  10 mg Oral q1800  . budesonide (PULMICORT) nebulizer solution  0.5 mg Nebulization BID  . carvedilol  6.25 mg Oral BID WC  . chlorhexidine gluconate (MEDLINE KIT)  15 mL Mouth Rinse BID  . feeding supplement  1 Container Oral TID BM  . fentaNYL (SUBLIMAZE) injection  200 mcg Intravenous Once  . furosemide  10 mg Oral Daily  . ipratropium-albuterol  3 mL Nebulization TID  . levothyroxine  50 mcg Oral QAC breakfast  . mirtazapine  7.5 mg Oral QHS  . multivitamin with minerals  1 tablet Oral Daily  . pantoprazole  40 mg Oral BID  . [START ON 09/11/2018] predniSONE  20 mg Oral Q breakfast   Followed by  . [START ON 09/12/2018] predniSONE  10 mg Oral Q breakfast  . sucralfate  1 g Oral TID WC & HS     Assessment/Plan:  1. Acute on chronic hypoxic respiratory failure.  Will wean off oxygen to 3 L that she has at home.  She is on 5 L of oxygen today and her oxygen to 3 L that we can discharge her 2. COPD exacerbation and pneumonia.  Patient on prednisone tapering prednisone.  Continue bronchodilators, clinically improving. 3. Iron deficiency anemia.  Status post 2 units of PRBC transfusion, hemoglobin improved from 6-9.6.,  Baseline hemoglobin is around 7.9. 4. History of diastolic congestive heart failure.  No signs currently 5. Hypothyroidism unspecified on levothyroxine 6. Hyperlipidemia unspecified 7. Essential hypertension; controlled.  Code Status:     Code Status Orders  (From admission, onward)         Start     Ordered   09/05/18 0817  Full  code  Continuous     09/05/18 0816        Code Status History    Date Active Date Inactive Code Status Order ID Comments User Context   06/03/2018 0955 06/08/2018 1838 Partial Code [id number]  Demetrios Loll, MD Inpatient   06/03/2018 0757 06/03/2018 0955 Full Code [id number]  Harrie Foreman, MD Inpatient   04/11/2018 1256 04/14/2018 1807 Full Code [id number]  Vaughan Basta,  MD ED   05/29/2017 1024 05/30/2017 1819 DNR [id number]  Hillary Bow, MD Inpatient   05/28/2017 2323 05/29/2017 1024 Full Code [id number]  Quintella Baton, MD Inpatient   02/06/2017 1525 02/08/2017 1404 Full Code [id number]  Idelle Crouch, MD ED     Family Communication: As per critical care team Disposition Plan: Likely out to the floor today  Consultants:  Critical care specialist  Antibiotics:  Zithromax only  Time spent: 27 minutes  [place]

## 2018-09-11 ENCOUNTER — Other Ambulatory Visit: Payer: Self-pay

## 2018-09-11 DIAGNOSIS — J441 Chronic obstructive pulmonary disease with (acute) exacerbation: Secondary | ICD-10-CM

## 2018-09-11 DIAGNOSIS — J9620 Acute and chronic respiratory failure, unspecified whether with hypoxia or hypercapnia: Secondary | ICD-10-CM

## 2018-09-11 LAB — ALPHA-1 ANTITRYPSIN PHENOTYPE: A-1 Antitrypsin, Ser: 192 mg/dL — ABNORMAL HIGH (ref 101–187)

## 2018-09-13 ENCOUNTER — Other Ambulatory Visit: Payer: Self-pay

## 2018-09-13 ENCOUNTER — Other Ambulatory Visit: Payer: Self-pay | Admitting: *Deleted

## 2018-09-13 ENCOUNTER — Encounter: Payer: Self-pay | Admitting: *Deleted

## 2018-09-13 MED ORDER — FUROSEMIDE 20 MG PO TABS: 20.0000 mg | ORAL_TABLET | Freq: Every day | ORAL | 3 refills | Status: DC

## 2018-09-13 NOTE — Patient Outreach (Signed)
[place] [facility]) [place] Telephone Outreach PCP office completes Transition of Care post-hospital discharge Post-hospital discharge day # 3 EMMI RED notification: discharge instructions/ follow up appointment Unsuccessful telephone outreach attempt # 1  09/13/2018  Melissa Knox 11/24/1950 [id number]  2:30 pm and 2:35 pm: Unsuccessful telephone outreach x 2 to Melissa Knox, 68 y/o female referred to [place] on 09/12/2018 by [facility] liaison after patient experienced recent hospitalization January 7-12, 2020 for acute respiratory failure with hypoxia/ COPD after outpatient bronchoscopy on September 04, 2018.  Patient was discharged home to self-care after refusing both SNF/ rehabilitation visit and home health services.  Patient has history including, but not limited to, chronic anemia; HTN/ HLD; CHF; COPD, on home O2 at baseline; CKD- stage II.  First outreach attempt to number listed as home number- left HIPAA compliant VM requesting call back; second attempt immediately after to cell phone, female person, apparently patient, answered phone and stated that she was unable to talk as she was picking her grand child up from school; person refused at this time to provide HIPAA identifiers, and she requested call back later today.  3:10 pm:  Re-attempted cal to patient as she had requested; HIPAA compliant voice mail message left for patient, requesting return call back.  Plan:  Will place [facility] Community CM unsuccessful patient outreach letter in mail requesting call back in writing  Will re-attempt [place] telephone outreach within 4 business days if I do not hear back from patient first.  Oneta Rack, RN, BSN, [employer] Coordinator [facility] Care Management  [phone]

## 2018-09-14 ENCOUNTER — Other Ambulatory Visit: Payer: Self-pay | Admitting: Adult Health

## 2018-09-14 MED ORDER — FUROSEMIDE 20 MG PO TABS: 20.0000 mg | ORAL_TABLET | Freq: Every day | ORAL | 6 refills | Status: DC

## 2018-09-14 NOTE — Discharge Summary (Signed)
Melissa Knox, is a 68 y.o. female  DOB 07-01-1951  MRN [id number].  Admission date:  09/05/2018  Admitting Physician  Loletha Grayer, MD  Discharge Date:  09/14/2018   Primary MD  Ronnell Freshwater, NP  Recommendations for primary care physician for things to follow:   Follow with PCP in 1 week Follow-up with pulmonologist in 1 week.  Admission Diagnosis  Acute respiratory failure with hypoxia (HCC) [J96.01] COPD with acute exacerbation (HCC) [J44.1] Symptomatic anemia [D64.9]   Discharge Diagnosis  Acute respiratory failure with hypoxia ([place]) [J96.01] COPD with acute exacerbation (HCC) [J44.1] Symptomatic anemia [D64.9]    Active Problems:   Acute on chronic respiratory failure with hypoxia (HCC)   Malnutrition of moderate degree      Past Medical History:  Diagnosis Date  . Atherosclerotic heart disease 06/12/2015  . CHF (congestive heart failure) ([place])   . Chronic congestive heart failure with left ventricular diastolic dysfunction ([place]) 04/12/2018  . CKD (chronic kidney disease) stage 2, GFR 60-89 ml/min 06/12/2015  . COPD (chronic obstructive pulmonary disease) ([place])   . Hypercalcemia 06/12/2015  . Hyperlipidemia 06/12/2015  . Hypertension 06/12/2015  . Hypothyroidism 06/12/2015  . Insomnia 06/12/2015  . Interstitial pneumonia ([place]) 04/12/2018  . 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 Shortness of breath 06/12/2015  . Solitary pulmonary nodule 06/12/2015    Past Surgical History:  Procedure Laterality Date  . GALLBLADDER SURGERY  2012       History of present illness and  Hospital Course:     Kindly see H&P for history of present illness and admission details, please review complete Labs, Consult reports and Test reports for all details in brief  HPI  from the history and physical done on  the day of admission 68 year old female with history of chronic respiratory failure on 3 L of oxygen comes in because of worsening shortness of breath, hypoxia with O2 sats like 40 to 50% on oxygen, admitted for acute respiratory failure on chronic respiratory failure, intubated in the emergency room, admitted to ICU.   Hospital Course  Acute respiratory failure with hypoxia secondary to COPD exacerbation, pneumonia, admitted to intensive care unit, intubated, sedated, received IV cefepime, vancomycin, Zithromax, bronchodilators, IV steroids, seen by ICU intensivist, patient is able to come off the ventilator slowly, transferred out of ICU.  Patient was vaping before, advised the patient not to vape anymore as vaping related to lung injuries are very dangerous.  Discharge home with prednisone dose taper.  Patient can continue her albuterol, Symbicort at discharge.  Patient has been of oxygen at 3 L all the time. #2 acute on chronic anemia, received 2 unit of blood transfusion.  Patient's hemoglobin improved from 6-9.6. #3 hypothyroidism: Continue Synthroid 4.  Diastolic heart failure: Continue Lasix at discharge 5.  Deconditioning, physical therapy recommended SNF placement but patient refused went home, did not want physical therapy as well.  Discharge Condition: Stable   Follow UP      Discharge Instructions  and  Discharge Medications     Allergies as of 09/10/2018   No Known Allergies     Medication List    TAKE these medications   albuterol (2.5 MG/3ML) 0.083% nebulizer solution Commonly known as:  PROVENTIL Take 2.5 mg by nebulization every 6 (six) hours as needed for wheezing or shortness of breath.   amLODipine 10 MG tablet Commonly known as:  NORVASC Take 1 tablet (10 mg total) by mouth daily.   aspirin EC 81 MG  tablet Take 81 mg by mouth daily.   atorvastatin 10 MG tablet Commonly known as:  LIPITOR Take 1 tablet (10 mg total) by mouth daily at 6 PM.    budesonide-formoterol 160-4.5 MCG/ACT inhaler Commonly known as:  SYMBICORT Inhale 2 puffs into the lungs 2 (two) times daily.   carvedilol 6.25 MG tablet Commonly known as:  COREG Take 1 tablet (6.25 mg total) by mouth 2 (two) times daily.   docusate sodium 100 MG capsule Commonly known as:  COLACE Take 1 capsule (100 mg total) by mouth 2 (two) times daily as needed for mild constipation.   levothyroxine 50 MCG tablet Commonly known as:  SYNTHROID, LEVOTHROID Take 1 tablet (50 mcg total) by mouth daily before breakfast.   loratadine 10 MG tablet Commonly known as:  CLARITIN Take 1 tablet (10 mg total) by mouth daily. What changed:    when to take this  reasons to take this   meclizine 12.5 MG tablet Commonly known as:  ANTIVERT Take 12.5 mg by mouth 3 (three) times daily as needed for dizziness.   mirtazapine 15 MG tablet Commonly known as:  REMERON Take 0.5 tablets (7.5 mg total) by mouth at bedtime.   pantoprazole 40 MG tablet Commonly known as:  PROTONIX Take 1 tablet (40 mg total) by mouth 2 (two) times daily.   predniSONE 10 MG (21) Tbpk tablet Commonly known as:  STERAPRED UNI-PAK 21 TAB Taper by 10 mg p.o. daily   sucralfate 1 g tablet Commonly known as:  CARAFATE Take 1 tablet (1 g total) by mouth 4 (four) times daily -  with meals and at bedtime.         Diet and Activity recommendation: See Discharge Instructions above   Consults obtained -intensivist, physical therapy   Major procedures and Radiology Reports - PLEASE review detailed and final reports for all details, in brief -     Dg Chest Port 1 View  Result Date: 09/06/2018 CLINICAL DATA:  Acute respiratory failure EXAM: PORTABLE CHEST 1 VIEW COMPARISON:  Yesterday FINDINGS: Endotracheal tube tip 2 cm above the carina. The orogastric tube is coiled in the stomach. Borderline cardiomegaly. Extensive atherosclerosis. Coarse opacities on the left more than right. Improved aeration, especially  the right perihilar lung. No effusion or pneumothorax. IMPRESSION: 1. Stable hardware positioning. 2. Pulmonary fibrosis with mild improvement in aeration. Electronically Signed   By: Monte Fantasia M.D.   On: 09/06/2018 05:35   Dg Chest Port 1 View  Result Date: 09/05/2018 CLINICAL DATA:  Short of breath COPD.  Bronchoscopy 24 hours ago. EXAM: PORTABLE CHEST 1 VIEW COMPARISON:  06/03/2018 FINDINGS: Endotracheal tube in good position.  Gastric tube in the stomach. Negative for pneumothorax post bronchoscopy Severe chronic lung disease. Progression of bibasilar airspace disease. Small bilateral pleural effusions, with progression on the left. IMPRESSION: No pneumothorax post bronchoscopy Endotracheal tube in good position Severe chronic airspace disease bilaterally. Progression of bibasilar airspace disease and left effusion. Electronically Signed   By: Franchot Gallo M.D.   On: 09/05/2018 07:25    Micro Results     Recent Results (from the past 240 hour(s))  Blood Culture (routine x 2)     Status: None   Collection Time: 09/05/18  6:48 AM  Result Value Ref Range Status   Specimen Description BLOOD LEFT ANTECUBITAL  Final   Special Requests   Final    BOTTLES DRAWN AEROBIC AND ANAEROBIC Blood Culture results may not be optimal due to an excessive volume  of blood received in culture bottles   Culture   Final    NO GROWTH 5 DAYS Performed at [facility], [address] [place], [place] [address]    Report Status 09/10/2018 FINAL  Final  MRSA PCR Screening     Status: None   Collection Time: 09/05/18  2:46 PM  Result Value Ref Range Status   MRSA by PCR NEGATIVE NEGATIVE Final    Comment:        The GeneXpert MRSA Assay (FDA approved for NASAL specimens only), is one component of a comprehensive MRSA colonization surveillance program. It is not intended to diagnose MRSA infection nor to guide or monitor treatment for MRSA infections. Performed at [facility],  [address] [place], [place] [address]        Today   Subjective:   Albertia Carvin today has no headache,no chest abdominal pain,no new weakness tingling or numbness, feels much better wants to go home today.   Objective:   Blood pressure 138/74, pulse 69, temperature 98.1 F (36.7 C), temperature source Oral, resp. rate 18, height 5\' 4"  (1.626 m), weight 66.9 kg, SpO2 98 %.  No intake or output data in the 24 hours ending 09/14/18 1135  Exam Awake Alert, Oriented x 3, No new F.N deficits, Normal affect [place].AT,PERRAL Supple Neck,No JVD, No cervical lymphadenopathy appriciated.  Symmetrical Chest wall movement, Good air movement bilaterally, CTAB RRR,No Gallops,Rubs or new Murmurs, No Parasternal Heave +ve B.Sounds, Abd Soft, Non tender, No organomegaly appriciated, No rebound -guarding or rigidity. No Cyanosis, Clubbing or edema, No new Rash or bruise  Data Review   CBC w Diff:  Lab Results  Component Value Date   WBC 14.7 (H) 09/08/2018   HGB 9.6 (L) 09/08/2018   HGB 11.4 (L) 08/26/2012   HCT 32.2 (L) 09/08/2018   HCT 32.8 (L) 08/26/2012   PLT 382 09/08/2018   PLT 235 08/26/2012   LYMPHOPCT 16 09/05/2018   LYMPHOPCT 12.4 08/26/2012   MONOPCT 8 09/05/2018   MONOPCT 10.7 08/26/2012   EOSPCT 1 09/05/2018   EOSPCT 0.5 08/26/2012   BASOPCT 1 09/05/2018   BASOPCT 0.5 08/26/2012    CMP:  Lab Results  Component Value Date   NA 145 09/08/2018   NA 138 08/24/2012   K 3.5 09/08/2018   K 3.8 08/27/2012   CL 113 (H) 09/08/2018   CL 106 08/24/2012   CO2 25 09/08/2018   CO2 25 08/24/2012   BUN 25 (H) 09/08/2018   BUN 12 08/24/2012   CREATININE 1.05 (H) 09/08/2018   CREATININE 0.92 08/24/2012   PROT 7.8 09/05/2018   ALBUMIN 2.9 (L) 09/05/2018   BILITOT 0.6 09/05/2018   ALKPHOS 66 09/05/2018   AST 17 09/05/2018   ALT 9 09/05/2018  .  Total time spent on discharge preparation more than 30 minutes

## 2018-09-15 ENCOUNTER — Other Ambulatory Visit: Payer: Self-pay | Admitting: *Deleted

## 2018-09-15 ENCOUNTER — Encounter: Payer: Self-pay | Admitting: *Deleted

## 2018-09-15 NOTE — Patient Outreach (Signed)
[place] [facility]) [place] Telephone Outreach PCP office completes Transition of Care follow up post-hospital discharge Post-hospital discharge day # 5  09/15/2018  Jazlynne Milliner 1951-01-26 [id number]  Successful telephone outreach to [name], 68 y/o female referred to [place] on 09/12/2018 by [facility] liaison after patient experienced recent hospitalization January 7-12, 2020 for acute respiratory failure with hypoxia/ COPD after outpatient bronchoscopy on September 04, 2018.  Patient was discharged home to self-care after refusing both SNF/ rehabilitation visit and home health services.  Patient has history including, but not limited to, chronic anemia; HTN/ HLD; CHF; COPD, on home O2 at baseline; CKD- stage II.  HIPAA/ identity verified with patient today; [place] CM services/ role were discussed with patient, and she initially stated that she did not need [facility] CM services; upon further engagement attempt, patient eventually stated that she "would give it a try."  Today, patient reports that she is "doing very good" post recent hospital discharge; she denies pain and new/ recent falls, and reports that she "did not refuse" home heath services post-hospital discharge.  Patient reports today that she "does not qualify" for home health services, as she "is too independent."  Patient sounds to be in no apparent distress throughout entirety of phone call today.  Patient further reports:  Medications: -- Has all medicationsand takes as prescribed;denies questions/ concerns about current medications.  -- self-manage medications; takes directly out of prescription bottles -- occasional issues with swallowing medications; states "takes some with applesauce" -- declines medication review today, stated that she "just doesn't feel like it right now."  Discussed value/ importance of prompt hospital discharge medication review, and she is  agreeable to complete this at time of next outreach  Provider appointments: -- No upcoming provider appointments noted; patient confirms that she has not yet heard from PCP post-hospital discharge; discussed value of prompt PCP follow up post-hospital discharge; patient agrees to schedule prompt appointment, and declines my offer to facilitate; states she would prefer to schedule herself  Safety/ Mobility/ Falls: -- denies new/ recent falls; reports one fall over last year, without injury -- assistive devices: has cane and walker, but does not routinely use; states "I don't need them" -- general fall risks/ prevention education discussed with patient today  Social/ Community Resource needs: -- currently denies community resource needs, stating supportive family members that assist with care needs as indicated; reports that she is raising her 7 year old granddaughter, who lives with her -- patient still drives and provides her own transportation for patient to all provider appointments, errands, etc  Advanced Directive (AD) Planning:   --reports does not currently have exisisting AD in place and declines desire for additional information around same; basics of AD planning were discussed with patient  Self-health management of chronic disease state of COPD: -- reports "breathing fine" post-hospital discharge and denies clinical concerns -- reports uses home O2 at 3 L/min; states that she uses [place] as supplier; reports "got a new concentrator in December" -- uses portable tanks when she goes out; states "it's okay;" denies issues around use of portable tanks  Patient denies further issues, concerns, or problems today.  I provided/ confirmed that patient has my direct phone number, the main [facility] CM office phone number, and the [facility] CM 24-hour nurse advice phone number should issues arise prior to next scheduled [place] outreach.  Encouraged patient to contact me directly if  needs, questions, issues,  or concerns arise prior to next scheduled outreach; patient agreed to do so.  Plan:  Patient will take medications as prescribed and will attend all scheduled provider appointments  Patient will promptly schedule post-hospital discharge office visit with PCP  Patient will promptly notify care providers for any new concerns/ issues/ problems that arise  I will make patien't PCP aware of [facility] Community RN CM involvement in patient's care-- will send barriers letter  [place] outreach to continue with scheduled phone call next week  [facility] CM Care Plan Problem One     Most Recent Value  Care Plan Problem One  High risk for hospital readmission related to/ as evidenced by recent hospital admisison January 7-12, 2020 for COPD exacerbation   Role Documenting the Problem One  Care Management [place] for Problem One  Active  [facility] Long Term Goal   Over the next 31 days, patient will not experience hospital readmission, as evidenced by patient reporting and revie wof EMR during [place] Term Goal Start Date  09/15/18  Interventions for Problem One Long Term Goal  Discussed current clinical condition with patient,  discussed hospital discharge instructions with patient ,  initiated [place] program  [facility] CM Short Term Goal #1   Over the next 21 days, patient will scheduled and attend post-hospital discharge appointment with PCP, as evidenced by patient reporting/ collaboration with PCP as indicated, during [place] outreach  [facility] CM Short Term Goal #1 Start Date  09/15/18  Interventions for Short Term Goal #1  Discussed with patient value of promptly scheduling post-hospital discharge PCP office visit,  encouraged patient to scheduled this appointment promptly and offered assistance in doing so if necessary     I appreciate the opportunity to participate in [place]'s care,  Oneta Rack, RN, BSN, [place] Coordinator [facility] Care Management  [phone]

## 2018-09-19 ENCOUNTER — Encounter: Payer: Self-pay | Admitting: Internal Medicine

## 2018-09-19 ENCOUNTER — Ambulatory Visit (INDEPENDENT_AMBULATORY_CARE_PROVIDER_SITE_OTHER): Payer: Medicare Other | Admitting: Internal Medicine

## 2018-09-19 ENCOUNTER — Other Ambulatory Visit: Payer: Self-pay | Admitting: Nurse Practitioner

## 2018-09-19 VITALS — BP 166/62 | HR 95 | Resp 16 | Ht 60.0 in | Wt 146.0 lb

## 2018-09-19 DIAGNOSIS — J849 Interstitial pulmonary disease, unspecified: Secondary | ICD-10-CM

## 2018-09-19 DIAGNOSIS — J449 Chronic obstructive pulmonary disease, unspecified: Secondary | ICD-10-CM | POA: Diagnosis not present

## 2018-09-19 DIAGNOSIS — J9611 Chronic respiratory failure with hypoxia: Secondary | ICD-10-CM | POA: Diagnosis not present

## 2018-09-19 DIAGNOSIS — I5031 Acute diastolic (congestive) heart failure: Secondary | ICD-10-CM | POA: Diagnosis not present

## 2018-09-19 MED ORDER — ATORVASTATIN CALCIUM 10 MG PO TABS: 10.0000 mg | ORAL_TABLET | Freq: Every day | ORAL | 3 refills | Status: DC

## 2018-09-19 NOTE — Progress Notes (Signed)
[facility] [place], [place] [address]  Pulmonary Sleep Medicine   Office Visit Note  Patient Name: Melissa Knox DOB: 03-Nov-1950 MRN [id number]  Date of Service: 09/19/2018  Complaints/HPI: Patient was seen by the interstitial lung disease clinic at [facility].  She was not recommended to have a bronchoscopy done by their interventional pulmonologist.  Bronchoscopy was done and she was apparently discharged home.  She subsequently had increased shortness of breath and difficulty ended up having to be admitted to the intensive care unit had [facility].  She was intubated and then subsequently extubated.  She is now on oxygen therapy.  She states that she is doing much better.  The results of the bronchoscopy were still pending and she states that she has a follow-up scheduled  ROS  General: (-) fever, (-) chills, (-) night sweats, (-) weakness Skin: (-) rashes, (-) itching,. Eyes: (-) visual changes, (-) redness, (-) itching. Nose and Sinuses: (-) nasal stuffiness or itchiness, (-) postnasal drip, (-) nosebleeds, (-) sinus trouble. Mouth and Throat: (-) sore throat, (-) hoarseness. Neck: (-) swollen glands, (-) enlarged thyroid, (-) neck pain. Respiratory: + cough, (-) bloody sputum, + shortness of breath, - wheezing. Cardiovascular: - ankle swelling, (-) chest pain. Lymphatic: (-) lymph node enlargement. Neurologic: (-) numbness, (-) tingling. Psychiatric: (-) anxiety, (-) depression   Current Medication: Outpatient Encounter Medications as of 09/19/2018  Medication Sig  . albuterol (PROVENTIL) (2.5 MG/3ML) 0.083% nebulizer solution Take 2.5 mg by nebulization every 6 (six) hours as needed for wheezing or shortness of breath.  Marland Kitchen amLODipine (NORVASC) 10 MG tablet Take 1 tablet (10 mg total) by mouth daily.  Marland Kitchen aspirin EC 81 MG tablet Take 81 mg by mouth daily.   Marland Kitchen atorvastatin (LIPITOR) 10 MG tablet Take 1 tablet (10 mg total) by mouth  daily at 6 PM.  . budesonide-formoterol (SYMBICORT) 160-4.5 MCG/ACT inhaler Inhale 2 puffs into the lungs 2 (two) times daily.  . carvedilol (COREG) 6.25 MG tablet Take 1 tablet (6.25 mg total) by mouth 2 (two) times daily.  Marland Kitchen docusate sodium (COLACE) 100 MG capsule Take 1 capsule (100 mg total) by mouth 2 (two) times daily as needed for mild constipation.  . furosemide (LASIX) 20 MG tablet Take 1 tablet (20 mg total) by mouth daily.  Marland Kitchen levothyroxine (SYNTHROID, LEVOTHROID) 50 MCG tablet Take 1 tablet (50 mcg total) by mouth daily before breakfast.  . loratadine (CLARITIN) 10 MG tablet Take 1 tablet (10 mg total) by mouth daily. (Patient taking differently: Take 10 mg by mouth daily as needed. )  . meclizine (ANTIVERT) 12.5 MG tablet Take 12.5 mg by mouth 3 (three) times daily as needed for dizziness.   . mirtazapine (REMERON) 15 MG tablet Take 0.5 tablets (7.5 mg total) by mouth at bedtime.  . OXYGEN Inhale 3 L into the lungs.  . pantoprazole (PROTONIX) 40 MG tablet Take 1 tablet (40 mg total) by mouth 2 (two) times daily.  . sucralfate (CARAFATE) 1 g tablet Take 1 tablet (1 g total) by mouth 4 (four) times daily -  with meals and at bedtime.  . [DISCONTINUED] atorvastatin (LIPITOR) 10 MG tablet Take 1 tablet (10 mg total) by mouth daily at 6 PM.  . [DISCONTINUED] predniSONE (STERAPRED UNI-PAK 21 TAB) 10 MG (21) TBPK tablet Taper by 10 mg p.o. daily (Patient not taking: Reported on 09/19/2018)   No facility-administered encounter medications on file as of 09/19/2018.     Surgical History: Past  Surgical History:  Procedure Laterality Date  . GALLBLADDER SURGERY  2012    Medical History: Past Medical History:  Diagnosis Date  . Atherosclerotic heart disease 06/12/2015  . CHF (congestive heart failure) ([place])   . Chronic congestive heart failure with left ventricular diastolic dysfunction ([place]) 04/12/2018  . CKD (chronic kidney disease) stage 2, GFR 60-89 ml/min 06/12/2015  . COPD (chronic  obstructive pulmonary disease) ([place])   . Hypercalcemia 06/12/2015  . Hyperlipidemia 06/12/2015  . Hypertension 06/12/2015  . Hypothyroidism 06/12/2015  . Insomnia 06/12/2015  . 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 Shortness of breath 06/12/2015  . Solitary pulmonary nodule 06/12/2015    Family History: Family History  Problem Relation Age of Onset  . Cancer Mother   . Hypertension Father   . Diabetes Father   . Cancer Father     Social History: Social History   Socioeconomic History  . Marital status: Married    Spouse name: Daryl  . Number of children: 3  . Years of education: 70  . Highest education level: 12th grade  Occupational History  . Not on file  Social Needs  . Financial resource strain: Not hard at all  . Food insecurity:    Worry: Never true    Inability: Never true  . Transportation needs:    Medical: No    Non-medical: No  Tobacco Use  . Smoking status: Former Smoker    Last attempt to quit: 2013    Years since quitting: 7.0  . Smokeless tobacco: Never Used  Substance and Sexual Activity  . Alcohol use: No    Alcohol/week: 0.0 standard drinks  . Drug use: No  . Sexual activity: Yes    Birth control/protection: None  Lifestyle  . Physical activity:    Days per week: 2 days    Minutes per session: 30 min  . Stress: Not at all  Relationships  . Social connections:    Talks on phone: Twice a week    Gets together: Once a week    Attends religious service: 1 to 4 times per year    Active member of club or organization: No    Attends meetings of clubs or organizations: Never    Relationship status: Married  . Intimate partner violence:    Fear of current or ex partner: No    Emotionally abused: No    Physically abused: No    Forced sexual activity: No  Other Topics Concern  . Not on file  Social History Narrative  . Not on file    Vital Signs: Blood pressure (!) 166/62, pulse 95,  resp. rate 16, height 5' (1.524 m), weight 146 lb (66.2 kg), SpO2 97 %.  Examination: General Appearance: The patient is well-developed, well-nourished, and in no distress. Skin: Gross inspection of skin unremarkable. Head: normocephalic, no gross deformities. Eyes: no gross deformities noted. ENT: ears appear grossly normal no exudates. Neck: Supple. No thyromegaly. No LAD. Respiratory: scattered rhonchi noted. Cardiovascular: Normal S1 and S2 without murmur or rub. Extremities: No cyanosis. pulses are equal. Neurologic: Alert and oriented. No involuntary movements.  LABS: Recent Results (from the past 2160 hour(s))  UA/M w/rflx Culture, Routine     Status: Abnormal   Collection Time: 07/17/18 11:40 AM  Result Value Ref Range   Specific Gravity, UA 1.007 1.005 - 1.030   pH, UA 7.5 5.0 - 7.5   Color, UA Yellow Yellow   Appearance Ur Clear  Clear   Leukocytes, UA 2+ (A) Negative   Protein, UA Negative Negative/Trace   Glucose, UA Negative Negative   Ketones, UA Negative Negative   RBC, UA Negative Negative   Bilirubin, UA Negative Negative   Urobilinogen, Ur 0.2 0.2 - 1.0 mg/dL   Nitrite, UA Negative Negative   Microscopic Examination See below:     Comment: Microscopic was indicated and was performed.   Urinalysis Reflex Comment     Comment: This specimen has reflexed to a Urine Culture.  Microscopic Examination     Status: None   Collection Time: 07/17/18 11:40 AM  Result Value Ref Range   WBC, UA 0-5 0 - 5 /hpf   RBC, UA 0-2 0 - 2 /hpf   Epithelial Cells (non renal) 0-10 0 - 10 /hpf   Casts None seen None seen /lpf   Mucus, UA Present Not Estab.   Bacteria, UA None seen None seen/Few  Urine Culture, Reflex     Status: None   Collection Time: 07/17/18 11:40 AM  Result Value Ref Range   Urine Culture, Routine Final report    Organism ID, Bacteria No growth   Comprehensive metabolic panel     Status: Abnormal   Collection Time: 09/05/18  6:48 AM  Result Value Ref  Range   Sodium 141 135 - 145 mmol/L   Potassium 3.4 (L) 3.5 - 5.1 mmol/L   Chloride 106 98 - 111 mmol/L   CO2 26 22 - 32 mmol/L   Glucose, Bld 110 (H) 70 - 99 mg/dL   BUN 13 8 - 23 mg/dL   Creatinine, Ser 1.31 (H) 0.44 - 1.00 mg/dL   Calcium 8.2 (L) 8.9 - 10.3 mg/dL   Total Protein 7.8 6.5 - 8.1 g/dL   Albumin 2.9 (L) 3.5 - 5.0 g/dL   AST 17 15 - 41 U/L   ALT 9 0 - 44 U/L   Alkaline Phosphatase 66 38 - 126 U/L   Total Bilirubin 0.6 0.3 - 1.2 mg/dL   GFR calc non Af Amer 42 (L) >60 mL/min   GFR calc Af Amer 49 (L) >60 mL/min   Anion gap 9 5 - 15    Comment: Performed at [facility], [address] [place], [place] [address]  Brain natriuretic peptide     Status: Abnormal   Collection Time: 09/05/18  6:48 AM  Result Value Ref Range   B Natriuretic Peptide 431.0 (H) 0.0 - 100.0 pg/mL    Comment: Performed at [facility], [address] [place], [place] [address]  Troponin I - Once     Status: Abnormal   Collection Time: 09/05/18  6:48 AM  Result Value Ref Range   Troponin I 0.04 (HH) <0.03 ng/mL    Comment: CRITICAL RESULT CALLED TO, READ BACK BY AND VERIFIED WITH  ALLIE BOWMAN AT 3329 09/05/18 SDR Performed at [facility], [address] [place], [place] [address]   CBC with Differential     Status: Abnormal   Collection Time: 09/05/18  6:48 AM  Result Value Ref Range   WBC 14.5 (H) 4.0 - 10.5 K/uL   RBC 2.78 (L) 3.87 - 5.11 MIL/uL   Hemoglobin 6.0 (L) 12.0 - 15.0 g/dL    Comment: Reticulocyte Hemoglobin testing may be clinically indicated, consider ordering this additional test [id number]    HCT 21.4 (L) 36.0 - 46.0 %   MCV 77.0 (L) 80.0 - 100.0 fL   MCH 21.6 (L) 26.0 - 34.0  pg   MCHC 28.0 (L) 30.0 - 36.0 g/dL   RDW 18.5 (H) 11.5 - 15.5 %   Platelets 625 (H) 150 - 400 K/uL   nRBC 0.1 0.0 - 0.2 %   Neutrophils Relative % 73 %   Neutro Abs 10.7 (H) 1.7 - 7.7 K/uL   Lymphocytes Relative 16 %   Lymphs Abs 2.3 0.7 - 4.0 K/uL   Monocytes  Relative 8 %   Monocytes Absolute 1.1 (H) 0.1 - 1.0 K/uL   Eosinophils Relative 1 %   Eosinophils Absolute 0.1 0.0 - 0.5 K/uL   Basophils Relative 1 %   Basophils Absolute 0.1 0.0 - 0.1 K/uL   Immature Granulocytes 1 %   Abs Immature Granulocytes 0.10 (H) 0.00 - 0.07 K/uL    Comment: Performed at [facility], [address] [place], [place] [address]  Blood gas, venous     Status: Abnormal   Collection Time: 09/05/18  6:48 AM  Result Value Ref Range   pH, Ven 7.37 7.250 - 7.430   pCO2, Ven 42 (L) 44.0 - 60.0 mmHg   pO2, Ven 85.0 (H) 32.0 - 45.0 mmHg   Bicarbonate 24.3 20.0 - 28.0 mmol/L   Acid-base deficit 1.1 0.0 - 2.0 mmol/L   O2 Saturation 96.1 %   Patient temperature 37.0    Collection site VEIN    Sample type VENIPUNCTURE     Comment: Performed at [facility], [address] [place], [place] [address]  Influenza panel by PCR (type A & B)     Status: None   Collection Time: 09/05/18  6:48 AM  Result Value Ref Range   Influenza A By PCR NEGATIVE NEGATIVE   Influenza B By PCR NEGATIVE NEGATIVE    Comment: (NOTE) The Xpert Xpress Flu assay is intended as an aid in the diagnosis of  influenza and should not be used as a sole basis for treatment.  This  assay is FDA approved for nasopharyngeal swab specimens only. Nasal  washings and aspirates are unacceptable for Xpert Xpress Flu testing. Performed at [facility], [address] [place], [place] [address]   Lactic acid, plasma     Status: Abnormal   Collection Time: 09/05/18  6:48 AM  Result Value Ref Range   Lactic Acid, Venous 3.4 (HH) 0.5 - 1.9 mmol/L    Comment: CRITICAL RESULT CALLED TO, READ BACK BY AND VERIFIED WITH  Samul Dada AT 3662 09/05/18 SDR Performed at [facility], [address] [place], [place] [address]   Procalcitonin     Status: None   Collection Time: 09/05/18  6:48 AM  Result Value Ref Range   Procalcitonin 0.23 ng/mL    Comment:         Interpretation: PCT (Procalcitonin) <= 0.5 ng/mL: Systemic infection (sepsis) is not likely. Local bacterial infection is possible. (NOTE)       Sepsis PCT Algorithm           Lower Respiratory Tract                                      Infection PCT Algorithm    ----------------------------     ----------------------------         PCT < 0.25 ng/mL                PCT < 0.10 ng/mL  Strongly encourage             Strongly discourage   discontinuation of antibiotics    initiation of antibiotics    ----------------------------     -----------------------------       PCT 0.25 - 0.50 ng/mL            PCT 0.10 - 0.25 ng/mL               OR       >80% decrease in PCT            Discourage initiation of                                            antibiotics      Encourage discontinuation           of antibiotics    ----------------------------     -----------------------------         PCT >= 0.50 ng/mL              PCT 0.26 - 0.50 ng/mL               AND        <80% decrease in PCT             Encourage initiation of                                             antibiotics       Encourage continuation           of antibiotics    ----------------------------     -----------------------------        PCT >= 0.50 ng/mL                  PCT > 0.50 ng/mL               AND         increase in PCT                  Strongly encourage                                      initiation of antibiotics    Strongly encourage escalation           of antibiotics                                     -----------------------------                                           PCT <= 0.25 ng/mL                                                 OR                                        >  80% decrease in PCT                                     Discontinue / Do not initiate                                             antibiotics Performed at [facility], [address] [place], [place] [address]    Blood Culture (routine x 2)     Status: None   Collection Time: 09/05/18  6:48 AM  Result Value Ref Range   Specimen Description BLOOD LEFT ANTECUBITAL    Special Requests      BOTTLES DRAWN AEROBIC AND ANAEROBIC Blood Culture results may not be optimal due to an excessive volume of blood received in culture bottles   Culture      NO GROWTH 5 DAYS Performed at [facility], [address], [place], [place] [address]    Report Status 09/10/2018 FINAL   Ferritin     Status: None   Collection Time: 09/05/18  6:48 AM  Result Value Ref Range   Ferritin 18 11 - 307 ng/mL    Comment: Performed at [facility], [address] [place], [place] [address]  Blood gas, arterial     Status: Abnormal   Collection Time: 09/05/18  7:13 AM  Result Value Ref Range   FIO2 0.70    Delivery systems VENTILATOR    Mode ASSIST CONTROL    VT 500 mL   LHR 16 resp/min   Peep/cpap 5.0 cm H20   pH, Arterial 7.37 7.350 - 7.450   pCO2 arterial 49 (H) 32.0 - 48.0 mmHg   pO2, Arterial 76 (L) 83.0 - 108.0 mmHg   Bicarbonate 28.3 (H) 20.0 - 28.0 mmol/L   Acid-Base Excess 2.2 (H) 0.0 - 2.0 mmol/L   O2 Saturation 94.6 %   Patient temperature 37.0    Sample type RIGHT RADIAL    Allens test (pass/fail) PASS PASS    Comment: Performed at [facility], [address] [place], [place] [address]  Prepare RBC     Status: None   Collection Time: 09/05/18  7:21 AM  Result Value Ref Range   Order Confirmation      ORDER PROCESSED BY BLOOD BANK Performed at [facility], [address], [place], [place] [address]   Type and screen Ordered by PROVIDER DEFAULT     Status: None   Collection Time: 09/05/18  8:39 AM  Result Value Ref Range   ABO/RH(D) A POS    Antibody Screen NEG    Sample Expiration 09/08/2018    Unit Number [id number]    Blood Component Type RED CELLS,LR    Unit division 00    Status of Unit ISSUED,FINAL    Transfusion Status OK TO TRANSFUSE     Crossmatch Result      Compatible Performed at [facility], [address] [place], [place] [address]    Unit Number [id number]    Blood Component Type RED CELLS,LR    Unit division 00    Status of Unit ISSUED,FINAL    Transfusion Status OK TO TRANSFUSE    Crossmatch Result Compatible   BPAM RBC     Status: None   Collection Time: 09/05/18  8:39 AM  Result Value Ref Range   ISSUE DATE / TIME [id number]    Blood Product Unit Number [id number]    PRODUCT CODE [id number]    Unit Type and Rh 6200    Blood Product Expiration Date 287681157262    ISSUE DATE / TIME [id number]    Blood Product Unit Number [id number]    PRODUCT CODE [id number]    Unit Type and Rh 6200    Blood Product Expiration Date 488891694503   Glucose, capillary     Status: Abnormal   Collection Time: 09/05/18  2:39 PM  Result Value Ref Range   Glucose-Capillary 173 (H) 70 - 99 mg/dL  MRSA PCR Screening     Status: None   Collection Time: 09/05/18  2:46 PM  Result Value Ref Range   MRSA by PCR NEGATIVE NEGATIVE    Comment:        The GeneXpert MRSA Assay (FDA approved for NASAL specimens only), is one component of a comprehensive MRSA colonization surveillance program. It is not intended to diagnose MRSA infection nor to guide or monitor treatment for MRSA infections. Performed at [facility], [address] [place], [place] [address]   Blood gas, arterial     Status: Abnormal   Collection Time: 09/06/18  5:00 AM  Result Value Ref Range   FIO2 0.35    Delivery systems VENTILATOR    Mode PRESSURE REGULATED VOLUME CONTROL    VT 450 mL   LHR 16 resp/min   Peep/cpap 5.0 cm H20   pH, Arterial 7.33 (L) 7.350 - 7.450   pCO2 arterial 44 32.0 - 48.0 mmHg   pO2, Arterial 66 (L) 83.0 - 108.0 mmHg   Bicarbonate 23.2 20.0 - 28.0 mmol/L   Acid-base deficit 2.8 (H) 0.0 - 2.0 mmol/L   O2 Saturation 91.1 %   Patient temperature 37.0    Collection site LEFT RADIAL    Sample type  ARTERIAL DRAW    Allens test (pass/fail) PASS PASS    Comment: Performed at [facility], [address] [place], [place] [address]  Basic metabolic panel     Status: Abnormal   Collection Time: 09/06/18  6:14 AM  Result Value Ref Range   Sodium 141 135 - 145 mmol/L   Potassium 3.7 3.5 - 5.1 mmol/L   Chloride 111 98 - 111 mmol/L   CO2 21 (L) 22 - 32 mmol/L   Glucose, Bld 139 (H) 70 - 99 mg/dL   BUN 17 8 - 23 mg/dL   Creatinine, Ser 1.31 (H) 0.44 - 1.00 mg/dL   Calcium 8.1 (L) 8.9 - 10.3 mg/dL   GFR calc non Af Amer 42 (L) >60 mL/min   GFR calc Af Amer 49 (L) >60 mL/min   Anion gap 9 5 - 15    Comment: Performed at [facility], [address] [place], [place] [address]  CBC     Status: Abnormal   Collection Time: 09/06/18  6:14 AM  Result Value Ref Range   WBC 8.2 4.0 - 10.5 K/uL   RBC 3.76 (L) 3.87 - 5.11 MIL/uL   Hemoglobin 9.0 (L) 12.0 - 15.0 g/dL   HCT 29.8 (L) 36.0 - 46.0 %   MCV 79.3 (L) 80.0 - 100.0 fL   MCH 23.9 (L) 26.0 - 34.0 pg   MCHC 30.2 30.0 - 36.0 g/dL   RDW 17.2 (H) 11.5 - 15.5 %   Platelets 448 (H) 150 - 400 K/uL   nRBC  0.0 0.0 - 0.2 %    Comment: Performed at [facility], [address] [place], [place] [address]  Glucose, capillary     Status: Abnormal   Collection Time: 09/06/18  4:26 PM  Result Value Ref Range   Glucose-Capillary 161 (H) 70 - 99 mg/dL  Glucose, capillary     Status: Abnormal   Collection Time: 09/06/18  7:29 PM  Result Value Ref Range   Glucose-Capillary 132 (H) 70 - 99 mg/dL   Comment 1 Notify RN   Glucose, capillary     Status: Abnormal   Collection Time: 09/06/18 11:28 PM  Result Value Ref Range   Glucose-Capillary 137 (H) 70 - 99 mg/dL   Comment 1 Notify RN   Glucose, capillary     Status: Abnormal   Collection Time: 09/07/18  3:30 AM  Result Value Ref Range   Glucose-Capillary 132 (H) 70 - 99 mg/dL   Comment 1 Notify RN   Magnesium     Status: None   Collection Time: 09/07/18  6:33 AM   Result Value Ref Range   Magnesium 1.8 1.7 - 2.4 mg/dL    Comment: Performed at [facility], [address], [place], [place] [address]  Phosphorus     Status: None   Collection Time: 09/07/18  6:33 AM  Result Value Ref Range   Phosphorus 3.0 2.5 - 4.6 mg/dL    Comment: Performed at [facility], [address] [place], [place] [address]  CBC     Status: Abnormal   Collection Time: 09/07/18  6:33 AM  Result Value Ref Range   WBC 16.3 (H) 4.0 - 10.5 K/uL   RBC 3.94 3.87 - 5.11 MIL/uL   Hemoglobin 9.5 (L) 12.0 - 15.0 g/dL   HCT 31.1 (L) 36.0 - 46.0 %   MCV 78.9 (L) 80.0 - 100.0 fL   MCH 24.1 (L) 26.0 - 34.0 pg   MCHC 30.5 30.0 - 36.0 g/dL   RDW 18.3 (H) 11.5 - 15.5 %   Platelets 402 (H) 150 - 400 K/uL   nRBC 0.1 0.0 - 0.2 %    Comment: Performed at [facility], [address] [place], [place] [address]  Basic metabolic panel     Status: Abnormal   Collection Time: 09/07/18  6:33 AM  Result Value Ref Range   Sodium 141 135 - 145 mmol/L   Potassium 3.5 3.5 - 5.1 mmol/L   Chloride 112 (H) 98 - 111 mmol/L   CO2 23 22 - 32 mmol/L   Glucose, Bld 181 (H) 70 - 99 mg/dL   BUN 23 8 - 23 mg/dL   Creatinine, Ser 1.12 (H) 0.44 - 1.00 mg/dL   Calcium 8.3 (L) 8.9 - 10.3 mg/dL   GFR calc non Af Amer 51 (L) >60 mL/min   GFR calc Af Amer 59 (L) >60 mL/min   Anion gap 6 5 - 15    Comment: Performed at [facility], [address] [place], [place] [address]  Alpha-1 antitrypsin phenotype     Status: Abnormal   Collection Time: 09/07/18  6:33 AM  Result Value Ref Range   A-1 Antitrypsin Pheno Comment     Comment: (NOTE) A1A Phenotype is consistent with a heterozygous phenotype consisting of one M (normal) allele and one allele that cannot be identified at this time. The unknown allele is not consistent with Z (deficient), S (deficient), or F (deficient).       Phenotype  Population      A-1-AT Concentration                   Incidence %       Reference Interval       MM            86.5%                96 - 189       MS             8.0%                83 - 161       MZ             3.9%                60 - 111       FM             0.4%                93 - 191       SZ             0.3%                42 -  75       SS             0.1%                62 - 119       ZZ             0.05%               16 -  38       FS             0.05%               70 - 128       FZ            Unknown              44 -  88       FF            Unknown              Unknown Performed At: [facility] [place], [place] [id number] Rush Farmer MD NW:[id number]    A-1 Antitrypsin, Ser 192 (H) 101 - 187 mg/dL  Glucose, capillary     Status: Abnormal   Collection Time: 09/07/18  7:57 AM  Result Value Ref Range   Glucose-Capillary 166 (H) 70 - 99 mg/dL  Glucose, capillary     Status: Abnormal   Collection Time: 09/07/18 11:43 AM  Result Value Ref Range   Glucose-Capillary 142 (H) 70 - 99 mg/dL  Glucose, capillary     Status: Abnormal   Collection Time: 09/07/18  4:35 PM  Result Value Ref Range   Glucose-Capillary 125 (H) 70 - 99 mg/dL  Glucose, capillary     Status: Abnormal   Collection Time: 09/07/18  7:35 PM  Result Value Ref Range   Glucose-Capillary 112 (H) 70 - 99 mg/dL  Glucose, capillary     Status: None   Collection Time: 09/08/18 12:30 AM  Result Value Ref Range   Glucose-Capillary 99 70 - 99 mg/dL   Comment 1 Document in Chart   CBC  Status: Abnormal   Collection Time: 09/08/18  3:40 AM  Result Value Ref Range   WBC 14.7 (H) 4.0 - 10.5 K/uL   RBC 4.01 3.87 - 5.11 MIL/uL   Hemoglobin 9.6 (L) 12.0 - 15.0 g/dL   HCT 32.2 (L) 36.0 - 46.0 %   MCV 80.3 80.0 - 100.0 fL   MCH 23.9 (L) 26.0 - 34.0 pg   MCHC 29.8 (L) 30.0 - 36.0 g/dL   RDW 18.8 (H) 11.5 - 15.5 %   Platelets 382 150 - 400 K/uL   nRBC 0.0 0.0 - 0.2 %    Comment: Performed at [facility], [address] [place], [place] [address]   Basic metabolic panel     Status: Abnormal   Collection Time: 09/08/18  3:40 AM  Result Value Ref Range   Sodium 145 135 - 145 mmol/L   Potassium 3.5 3.5 - 5.1 mmol/L   Chloride 113 (H) 98 - 111 mmol/L   CO2 25 22 - 32 mmol/L   Glucose, Bld 115 (H) 70 - 99 mg/dL   BUN 25 (H) 8 - 23 mg/dL   Creatinine, Ser 1.05 (H) 0.44 - 1.00 mg/dL   Calcium 8.7 (L) 8.9 - 10.3 mg/dL   GFR calc non Af Amer 55 (L) >60 mL/min   GFR calc Af Amer >60 >60 mL/min   Anion gap 7 5 - 15    Comment: Performed at [facility], [address] [place], [place] [address]  Glucose, capillary     Status: Abnormal   Collection Time: 09/08/18  3:49 AM  Result Value Ref Range   Glucose-Capillary 115 (H) 70 - 99 mg/dL  Glucose, capillary     Status: Abnormal   Collection Time: 09/08/18  7:32 AM  Result Value Ref Range   Glucose-Capillary 111 (H) 70 - 99 mg/dL  Glucose, capillary     Status: None   Collection Time: 09/08/18 11:38 AM  Result Value Ref Range   Glucose-Capillary 95 70 - 99 mg/dL  Glucose, capillary     Status: Abnormal   Collection Time: 09/08/18  4:32 PM  Result Value Ref Range   Glucose-Capillary 128 (H) 70 - 99 mg/dL  Glucose, capillary     Status: Abnormal   Collection Time: 09/08/18  9:16 PM  Result Value Ref Range   Glucose-Capillary 127 (H) 70 - 99 mg/dL   Comment 1 Notify RN   Glucose, capillary     Status: Abnormal   Collection Time: 09/09/18  2:01 AM  Result Value Ref Range   Glucose-Capillary 104 (H) 70 - 99 mg/dL   Comment 1 Notify RN   Glucose, capillary     Status: Abnormal   Collection Time: 09/09/18  5:22 AM  Result Value Ref Range   Glucose-Capillary 108 (H) 70 - 99 mg/dL   Comment 1 Notify RN   Glucose, capillary     Status: None   Collection Time: 09/09/18  8:42 AM  Result Value Ref Range   Glucose-Capillary 84 70 - 99 mg/dL   Comment 1 Notify RN   Glucose, capillary     Status: Abnormal   Collection Time: 09/09/18 12:19 PM  Result Value Ref Range    Glucose-Capillary 108 (H) 70 - 99 mg/dL   Comment 1 Notify RN   Glucose, capillary     Status: Abnormal   Collection Time: 09/09/18  4:41 PM  Result Value Ref Range   Glucose-Capillary 130 (H) 70 - 99 mg/dL  Comment 1 Notify RN   Glucose, capillary     Status: Abnormal   Collection Time: 09/09/18  8:07 PM  Result Value Ref Range   Glucose-Capillary 129 (H) 70 - 99 mg/dL   Comment 1 Notify RN   Glucose, capillary     Status: Abnormal   Collection Time: 09/10/18 12:01 AM  Result Value Ref Range   Glucose-Capillary 129 (H) 70 - 99 mg/dL   Comment 1 Notify RN   Glucose, capillary     Status: None   Collection Time: 09/10/18  4:15 AM  Result Value Ref Range   Glucose-Capillary 84 70 - 99 mg/dL   Comment 1 Notify RN   Glucose, capillary     Status: Abnormal   Collection Time: 09/10/18  7:56 AM  Result Value Ref Range   Glucose-Capillary 115 (H) 70 - 99 mg/dL   Comment 1 Notify RN   Glucose, capillary     Status: None   Collection Time: 09/10/18 12:05 PM  Result Value Ref Range   Glucose-Capillary 97 70 - 99 mg/dL   Comment 1 Notify RN     Radiology: Dg Chest Port 1 View  Result Date: 09/06/2018 CLINICAL DATA:  Acute respiratory failure EXAM: PORTABLE CHEST 1 VIEW COMPARISON:  Yesterday FINDINGS: Endotracheal tube tip 2 cm above the carina. The orogastric tube is coiled in the stomach. Borderline cardiomegaly. Extensive atherosclerosis. Coarse opacities on the left more than right. Improved aeration, especially the right perihilar lung. No effusion or pneumothorax. IMPRESSION: 1. Stable hardware positioning. 2. Pulmonary fibrosis with mild improvement in aeration. Electronically Signed   By: Monte Fantasia M.D.   On: 09/06/2018 05:35   Dg Chest Port 1 View  Result Date: 09/05/2018 CLINICAL DATA:  Short of breath COPD.  Bronchoscopy 24 hours ago. EXAM: PORTABLE CHEST 1 VIEW COMPARISON:  06/03/2018 FINDINGS: Endotracheal tube in good position.  Gastric tube in the stomach. Negative  for pneumothorax post bronchoscopy Severe chronic lung disease. Progression of bibasilar airspace disease. Small bilateral pleural effusions, with progression on the left. IMPRESSION: No pneumothorax post bronchoscopy Endotracheal tube in good position Severe chronic airspace disease bilaterally. Progression of bibasilar airspace disease and left effusion. Electronically Signed   By: Franchot Gallo M.D.   On: 09/05/2018 07:25    No results found.  Dg Chest Port 1 View  Result Date: 09/06/2018 CLINICAL DATA:  Acute respiratory failure EXAM: PORTABLE CHEST 1 VIEW COMPARISON:  Yesterday FINDINGS: Endotracheal tube tip 2 cm above the carina. The orogastric tube is coiled in the stomach. Borderline cardiomegaly. Extensive atherosclerosis. Coarse opacities on the left more than right. Improved aeration, especially the right perihilar lung. No effusion or pneumothorax. IMPRESSION: 1. Stable hardware positioning. 2. Pulmonary fibrosis with mild improvement in aeration. Electronically Signed   By: Monte Fantasia M.D.   On: 09/06/2018 05:35   Dg Chest Port 1 View  Result Date: 09/05/2018 CLINICAL DATA:  Short of breath COPD.  Bronchoscopy 24 hours ago. EXAM: PORTABLE CHEST 1 VIEW COMPARISON:  06/03/2018 FINDINGS: Endotracheal tube in good position.  Gastric tube in the stomach. Negative for pneumothorax post bronchoscopy Severe chronic lung disease. Progression of bibasilar airspace disease. Small bilateral pleural effusions, with progression on the left. IMPRESSION: No pneumothorax post bronchoscopy Endotracheal tube in good position Severe chronic airspace disease bilaterally. Progression of bibasilar airspace disease and left effusion. Electronically Signed   By: Franchot Gallo M.D.   On: 09/05/2018 07:25      Assessment and Plan: Patient Active Problem  List   Diagnosis Date Noted  . Malnutrition of moderate degree 09/07/2018  . Acute on chronic respiratory failure with hypoxia ([place]) 09/05/2018  .  Screening for breast cancer 07/19/2018  . HCAP (healthcare-associated pneumonia) 06/03/2018  . Chronic congestive heart failure with left ventricular diastolic dysfunction ([place]) 04/12/2018  . Obstructive chronic bronchitis without exacerbation ([place]) 02/17/2018  . Major depression, chronic 02/17/2018  . 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 Pollen allergies 01/16/2018  . CHF (congestive heart failure) ([place]) 05/28/2017  . Syncope 02/06/2017  . Pancreatic mass 02/06/2017  . Iron deficiency anemia 05/01/2016  . Anemia in chronic renal disease 04/26/2016  . Insomnia 06/12/2015  . CKD (chronic kidney disease) stage 2, GFR 60-89 ml/min 06/12/2015  . Hypercalcemia 06/12/2015  . Hypothyroidism 06/12/2015  . Pulmonary heart disease ([place]) 06/12/2015  . Hyperlipidemia 06/12/2015  . Hypertension 06/12/2015  . Atherosclerotic heart disease 06/12/2015  . Solitary pulmonary nodule 06/12/2015    1. ILD patient underwent bronchoscopy at [facility].  The results of the biopsy were pending I tried to find them through care everywhere but was not able to locate. 2. COPD with present management and supportive care.  Continue with oxygen therapy 3. Chronic respiratory failure with hypoxia she is on oxygen therapy and this will be continued. 4. Diastolic Heart failure appears to be compensated at this time  General Counseling: I have discussed the findings of the evaluation and examination with Kasha.  I have also discussed any further diagnostic evaluation thatmay be needed or ordered today. Calleigh verbalizes understanding of the findings of todays visit. We also reviewed her medications today and discussed drug interactions and side effects including but not limited excessive drowsiness and altered mental states. We also discussed that there is always a risk not just to her but also people around her. she has been encouraged to call the office with any questions or concerns that should arise related to todays  visit.    Time spent: 15 minutes  I have personally obtained a history, examined the patient, evaluated laboratory and imaging results, formulated the assessment and plan and placed orders.    Allyne Gee, MD [facility] Sleep medicine

## 2018-09-19 NOTE — Patient Instructions (Signed)
Pulmonary Fibrosis  Pulmonary fibrosis is a type of lung disease that causes scarring. Over time, the scar tissue builds up in the air sacs of your lungs (alveoli). This makes it hard for you to breathe. Less oxygen can get into your blood. Scarring from pulmonary fibrosis gets worse over time. This damage is permanent and may lead to other serious health problems. What are the causes? There are many different causes of pulmonary fibrosis. Sometimes the cause is not known. This is called idiopathic pulmonary fibrosis. Other causes include:  Exposure to chemicals and substances found in agricultural, farm, construction, or factory work. These include mold, asbestos, silica, metal dusts, and toxic fumes.  Sarcoidosis. In this disease, areas of inflammatory cells (granulomas) form and most often affect the lungs.  Autoimmune diseases. These include diseases such as rheumatoid arthritis, systemic sclerosis, or connective tissue disease.  Taking certain medicines. These include drugs used in radiation therapy or used to treat seizures, heart problems, and some infections. What increases the risk? You are more likely to develop this condition if:  You have a family history of the disease.  You are older. The condition is more common in older adults.  You have a history of smoking.  You have a job that exposes you to certain chemicals.  You have gastroesophageal reflux disease (GERD). What are the signs or symptoms? Symptoms of this condition include:  Difficulty breathing that gets worse with activity.  Shortness of breath (dyspnea).  Dry, hacking cough.  Rapid, shallow breathing during exercise or while at rest.  Bluish skin and lips.  Loss of appetite.  Weakness.  Weight loss and fatigue.  Rounded and enlarged fingertips (clubbing). How is this diagnosed? This condition may be diagnosed based on:  Your symptoms and medical history.  A physical exam. You may also have  tests, including:  A test that involves looking inside your lungs with an instrument (bronchoscopy).  Imaging studies of your lungs and heart.  Tests to measure how well you are breathing (pulmonary function tests).  Blood tests.  Tests to see how well your lungs work while you are walking (pulmonary stress test).  A procedure to remove a lung tissue sample to look at it under a microscope (biopsy). How is this treated? There is no cure for pulmonary fibrosis. Treatment focuses on managing symptoms and preventing scarring from getting worse. This may include:  Medicines, such as: ? Steroids to prevent permanent lung changes. ? Medicines to suppress your body's defense system (immune system). ? Medicines to help with lung function by reducing inflammation or scarring.  Ongoing monitoring with X-rays and lab work.  Oxygen therapy.  Pulmonary rehabilitation.  Surgery. In some cases, a lung transplant is possible. Follow these instructions at home:     Medicines  Take over-the-counter and prescription medicines only as told by your health care provider.  Keep your vaccinations up to date as recommended by your health care provider. General instructions  Do not use any products that contain nicotine or tobacco, such as cigarettes and e-cigarettes. If you need help quitting, ask your health care provider.  Get regular exercise, but do not overexert yourself. Ask your health care provider to suggest some activities that are safe for you to do. ? If you have physical limitations, you may get exercise by walking, using a stationary bike, or doing chair exercises. ? Ask your health care provider about using oxygen while exercising.  If you are exposed to chemicals and substances at work,   make sure that you wear a mask or respirator at all times.  Join a pulmonary rehabilitation program or a support group for people with pulmonary fibrosis.  Eat small meals often so you do not  get too full. Overeating can make breathing trouble worse.  Maintain a healthy weight. Lose weight if you need to.  Do breathing exercises as directed by your health care provider.  Keep all follow-up visits as told by your health care provider. This is important. Contact a health care provider if you:  Have symptoms that do not get better with medicines.  Are not able to be as active as usual.  Have trouble taking a deep breath.  Have a fever or chills.  Have blue lips or skin.  Have clubbing of your fingers. Get help right away if you:  Have a sudden worsening of your symptoms.  Have chest pain.  Cough up mucus that is dark in color.  Have a lot of headaches.  Get very confused or sleepy. Summary  Pulmonary fibrosis is a type of lung disease that causes scar tissue to build up in the air sacs of your lungs (alveoli) over time. Less oxygen can get into your blood. This makes it hard for you to breathe.  Scarring from pulmonary fibrosis gets worse over time. This damage is permanent and may lead to other serious health problems.  You are more likely to develop this condition if you have a family history of the condition or a job that exposes you to certain chemicals.  There is no cure for pulmonary fibrosis. Treatment focuses on managing symptoms and preventing scarring from getting worse. This information is not intended to replace advice given to you by your health care provider. Make sure you discuss any questions you have with your health care provider. Document Released: 11/06/2003 Document Revised: 09/21/2017 Document Reviewed: 09/21/2017 Elsevier Interactive Patient Education  2019 Elsevier Inc.  

## 2018-09-20 ENCOUNTER — Encounter: Payer: Self-pay | Admitting: *Deleted

## 2018-09-20 ENCOUNTER — Other Ambulatory Visit: Payer: Self-pay | Admitting: *Deleted

## 2018-09-20 NOTE — Patient Outreach (Signed)
[place] [facility]) [place] Telephone Outreach PCP office completes Transition of Care follow up post-hospital discharge Post-hospital discharge day # 10  09/20/2018  Melissa Knox 19-Jul-1951 [id number]  Successful telephoneoutreach to Melissa Knox, 68 y/o female referred to [place] on 09/12/2018 by [facility] liaison after patient experienced recent hospitalization January 7-12, 2020 for acute respiratory failure with hypoxia/ COPD after outpatient bronchoscopy on September 04, 2018. Patient was discharged home to self-care after refusing both SNF/ rehabilitation visit and home health services.Patient has history including, but not limited to, chronic anemia; HTN/ HLD; CHF; COPD, on home O2 at baseline; CKD- stage II.  HIPAA/ identity verified with patient today.  Today, patient reports that she is "doing just fine" and she denies pain and new/ recent falls.  Patient sounds to be in no apparent distress throughout entirety of phone call today.  Patient further reports:  Medications: -- Has all medicationsand takes as prescribed;denies questions/ concerns about current medications.  -- Patient was recently discharged from hospital and all medications were thoroughly reviewed with patient today: medication list in EMR was updated to reflect patient's verbal report of current medications: losartan was added, as patient reports her kidney doctor re-started this medication in November 2019; no other discrepancies/ concerns identified during medication review today  Provider appointments: -- No upcoming provider appointments noted; patient again states that she has not yet heard from PCP post-hospital discharge; again discussed value of prompt PCP follow up post-hospital discharge, however, patient reports that she attended scheduled pulmonology office visit yesterday where she "got a real good report;" she declines need to see PCP; I encouraged  her to continue considering making post-hospital discharge appointment with PCP.   -- reviewed with patient yesterday's scheduled pulmonology visit- she denies that changes were made to medications/ overall plan of care -- encouraged patient to promptly notify care providers for any new concerns/ issues/ problems that arise  Self-health management of chronic disease state of COPD: -- reports "still breathing fine" post-hospital discharge and denies clinical concerns- we discussed signs/ symptoms concerning for COPD exacerbation/ yellow COPD zone- patient verbalizes good general understanding of same -- continues using home O2 at 3 L/min; denies issues/ concerns around home O2; reports no rescue use of nebulizer- states "has only used once a day" for maintenance dosing -- reports "tries to eat healthy;" we discussed basic strategies to follow heart healthy/ low sodium diet; patient would like additional information- discussed with her that I would put printed EMMI education in the mail to her for her review/ further discussion at next [place] outreach by phone, in 3 weeks  Patient denies further issues, concerns, or problems today. I confirmed that patient hasmy direct phone number, the main [facility] CM office phone number, and the [facility] CM 24-hour nurse advice phone number should issues arise prior to next scheduled [place] outreach.  Encouraged patient to contact me directly if needs, questions, issues, or concerns arise prior to next scheduled outreach; patient agreed to do so.  Plan:  Patient will take medications as prescribed and will attend all scheduled provider appointments  Patient will consider promptly scheduling post-hospital discharge office visit with PCP  Patient will promptly notify care providers for any new concerns/ issues/ problems that arise  I will share today's [place] RN CM note/ care plan with patient's PCP as initial assessment  [place] outreach to continue with scheduled phone call within 3 weeks  [facility]N  CM Care Plan Problem One     Most Recent Value  Care Plan Problem One  High risk for hospital readmission related to/ as evidenced by recent hospital admisison January 7-12, 2020 for COPD exacerbation   Role Documenting the Problem One  Care Management [place] for Problem One  Active  THN Long Term Goal   Over the next 31 days, patient will not experience hospital readmission, as evidenced by patient reporting and revie wof EMR during [place] outreach  [facility] Long Term Goal Start Date  09/15/18  Interventions for Problem One Long Term Goal  Discussed with patient current clinical condition and confirmed that she does not have current concerns around her condition,  Initial assessment completed and sent via EMR to PCP,  encouraged patient to promptly notify care providers for any new concerns/ issues/ problems that arise,  completed medication review with patient and updated medication list in EMR aorund patient's report of current medications  [facility] CM Short Term Goal #1   Over the next 21 days, patient will scheduled and attend post-hospital discharge appointment with PCP, as evidenced by patient reporting/ collaboration with PCP as indicated, during [place] outreach  [facility] CM Short Term Goal #1 Start Date  09/15/18  Interventions for Short Term Goal #1  Reviewed yesterday's pulmonology provider office visit with patient and confirmed that no changes to medications or plan of care were made,  again encouraged patient to consider making prompt PCP appointment post-hospital discharge    [facility] CM Care Plan Problem Two     Most Recent Value  Care Plan Problem Two  Self- health management of chronic disease state of COPD, as evidenced by patient reporting   Role Documenting the Problem Two  Care Management Coordinator  Care Plan for Problem Two  Active  Interventions for Problem Two Long Term Goal   Discussed with patient  her current understanding of COPD, use of prescribed nebulizer and inhalers, and home O2,  discussed signs/ symptoms yellow zone for COPD along with corresponding action plan,  discussed that I would place printed EMMI educational material in mail to her and encouraged her to review for further discussion  THN Long Term Goal  Over the next 45 days, patient will verbalize three self-health management strategies for chronic disease state of COPD, as evidenced by patient reporting during [employer] CM outreach  [facility] Long Term Goal Start Date  09/20/18  THN CM Short Term Goal #1   Over the next 30 days, patient will verbalize qualities of low sodium/ heart healthy diet along with 3 strategies to begin following prescribed diet, as evidenced by patient reporting during [place] RN CM outreach  [facility] CM Short Term Goal #1 Start Date  09/20/18  Interventions for Short Term Goal #2   Discussed with patient basics of heart healthy low sodium diet and provided several basic strategies to begin following prescribed diet,  discussed with patient that i would mail her printed educational material around following heart healthy/ low sodium diet     Oneta Rack, RN, BSN, [employer] Coordinator [facility] Care Management  [phone]

## 2018-09-21 ENCOUNTER — Other Ambulatory Visit: Payer: Self-pay

## 2018-09-21 ENCOUNTER — Telehealth: Payer: Self-pay

## 2018-09-21 DIAGNOSIS — B449 Aspergillosis, unspecified: Secondary | ICD-10-CM

## 2018-09-21 MED ORDER — PANTOPRAZOLE SODIUM 40 MG PO TBEC: 40.0000 mg | DELAYED_RELEASE_TABLET | Freq: Two times a day (BID) | ORAL | 3 refills | Status: DC

## 2018-09-21 NOTE — Telephone Encounter (Signed)
Pt advised we send labs order to [facility] we want done asap she is going tomorrow morning

## 2018-09-22 ENCOUNTER — Other Ambulatory Visit: Payer: Self-pay | Admitting: Internal Medicine

## 2018-09-22 DIAGNOSIS — B449 Aspergillosis, unspecified: Secondary | ICD-10-CM | POA: Diagnosis not present

## 2018-09-23 LAB — CBC WITH DIFFERENTIAL/PLATELET
Basophils Absolute: 0.1 10*3/uL (ref 0.0–0.2)
Basos: 1 %
EOS (ABSOLUTE): 0.6 10*3/uL — AB (ref 0.0–0.4)
Eos: 6 %
Hematocrit: 32.8 % — ABNORMAL LOW (ref 34.0–46.6)
Hemoglobin: 10.1 g/dL — ABNORMAL LOW (ref 11.1–15.9)
Immature Grans (Abs): 0 10*3/uL (ref 0.0–0.1)
Immature Granulocytes: 0 %
Lymphocytes Absolute: 2 10*3/uL (ref 0.7–3.1)
Lymphs: 21 %
MCH: 24.3 pg — ABNORMAL LOW (ref 26.6–33.0)
MCHC: 30.8 g/dL — ABNORMAL LOW (ref 31.5–35.7)
MCV: 79 fL (ref 79–97)
MONOS ABS: 0.6 10*3/uL (ref 0.1–0.9)
Monocytes: 6 %
Neutrophils Absolute: 6.5 10*3/uL (ref 1.4–7.0)
Neutrophils: 66 %
PLATELETS: 383 10*3/uL (ref 150–450)
RBC: 4.16 x10E6/uL (ref 3.77–5.28)
RDW: 18.7 % — ABNORMAL HIGH (ref 11.7–15.4)
WBC: 9.8 10*3/uL (ref 3.4–10.8)

## 2018-09-23 LAB — COMPREHENSIVE METABOLIC PANEL
ALT: 22 IU/L (ref 0–32)
AST: 17 IU/L (ref 0–40)
Albumin/Globulin Ratio: 1.1 — ABNORMAL LOW (ref 1.2–2.2)
Albumin: 3.5 g/dL — ABNORMAL LOW (ref 3.8–4.8)
Alkaline Phosphatase: 76 IU/L (ref 39–117)
BUN/Creatinine Ratio: 14 (ref 12–28)
BUN: 20 mg/dL (ref 8–27)
Bilirubin Total: 0.2 mg/dL (ref 0.0–1.2)
CHLORIDE: 99 mmol/L (ref 96–106)
CO2: 24 mmol/L (ref 20–29)
Calcium: 8.9 mg/dL (ref 8.7–10.3)
Creatinine, Ser: 1.47 mg/dL — ABNORMAL HIGH (ref 0.57–1.00)
GFR calc Af Amer: 42 mL/min/{1.73_m2} — ABNORMAL LOW (ref >59)
GFR calc non Af Amer: 37 mL/min/{1.73_m2} — ABNORMAL LOW (ref >59)
GLUCOSE: 85 mg/dL (ref 65–99)
Globulin, Total: 3.3 g/dL (ref 1.5–4.5)
Potassium: 4.2 mmol/L (ref 3.5–5.2)
Sodium: 140 mmol/L (ref 134–144)
Total Protein: 6.8 g/dL (ref 6.0–8.5)

## 2018-09-27 ENCOUNTER — Telehealth: Payer: Self-pay

## 2018-09-27 NOTE — Telephone Encounter (Signed)
Can you review her labs

## 2018-09-27 NOTE — Telephone Encounter (Signed)
Looked at her labs. They are much better and continue to improve. I will talk to Dr. Devona Konig about them tomorrow.

## 2018-09-28 ENCOUNTER — Other Ambulatory Visit: Payer: Self-pay

## 2018-09-28 ENCOUNTER — Ambulatory Visit: Payer: Self-pay | Admitting: Internal Medicine

## 2018-09-28 MED ORDER — SUCRALFATE 1 G PO TABS: 1.0000 g | ORAL_TABLET | Freq: Three times a day (TID) | ORAL | 3 refills | Status: DC

## 2018-09-28 NOTE — Telephone Encounter (Signed)
Pt advised as per heather labs improved we going to keep eye

## 2018-10-02 DIAGNOSIS — R928 Other abnormal and inconclusive findings on diagnostic imaging of breast: Secondary | ICD-10-CM | POA: Diagnosis not present

## 2018-10-02 DIAGNOSIS — R921 Mammographic calcification found on diagnostic imaging of breast: Secondary | ICD-10-CM | POA: Diagnosis not present

## 2018-10-03 ENCOUNTER — Encounter: Payer: Self-pay | Admitting: *Deleted

## 2018-10-03 ENCOUNTER — Other Ambulatory Visit: Payer: Self-pay | Admitting: *Deleted

## 2018-10-03 NOTE — Patient Outreach (Signed)
[facility]) Care Management [place] Telephone Outreach PCP office completes Transition of Care outreach post-hospital discharge Post-hospital discharge day # 23  10/03/2018  Melissa Knox October 03, 1950 [id number]  Successful telephoneoutreach to Melissa Knox, 68 y/o female referred to [place] on 09/12/2018 by [facility] liaison after patient experienced recent hospitalization January 7-12, 2020 for acute respiratory failure with hypoxia/ COPD after outpatient bronchoscopy on September 04, 2018. Patient was discharged home to self-care after refusing both SNF/ rehabilitation visit and home health services.Patient has history including, but not limited to, chronic anemia; HTN/ HLD; CHF; COPD, on home O2 at baseline; CKD- stage II.HIPAA/ identity verified with patient today.  Today, patient reports that she is "doing pretty good" but has obvious voice hoarseness; patient states that she has been hoarse for several days and she denies malaise, throat soreness, fever; states she "feels fine."  Patient denies pain and new/ recent falls.  Other than her hoarseness, patient sounds to be in no apparent distress throughout phone call today.  Patient further reports:  -- Has all medicationsand takes as prescribed;denies questions/ concernsabout current medications, but does tell me today that her pulmonary provider started a new medication, voriconazole 200 mg BID for "a fungal infection;" reports that this medication is "very expensive," and that she has obtained and is taking this medication, is not sure that she can afford it, stating that she was told by provider that she may need to be on new medication "for 6 months to a year."  Discussed [place] referral for possible financial assistance with medication, and patient is agreeable to this- encouraged patient to engage with pharmacy team once outreach is made, and she agrees to do so.  Denies further changes  to medications.  -- No recent provider appointments; patient reports that she does not believe that she needs to attend PCP office visit, since she has already visited with pulmonary provider; again discussed value of prompt PCP follow up post-hospital discharge, however, patient states she does not think she will schedule, as she has previously scheduled office with PCP in March 2020.  Reviewed with patient all scheduled upcoming provider appointments, and she confirms that she continues driving self to appointments; again encouraged patient to promptly notify care providers for any new concerns/ issues/ problems that arise- including if her laryngitis worsens or persists  Self-health management ofchronic disease state of COPD: --reports that she believes she is "breathing fine" and using teach back method, we discussed previously provided printed educational material mailed to patient around signs/ symptoms concerning for COPD exacerbation/ yellow COPD zone- patient again verbalizes good general understanding of same, and is able to verbalize appropriate action plan for COPD exacerbation. -- continues using home O2 at 2- 3 L/min; denies issues/ concerns around home O2 -- reports has begun reviewing previously mailed printed EMMI education around low salt, heart healthy diet; stated that she has started "making small changes" and has come to the realization that she "does much better" when she eats at home foods she has prepared; states that she has "practically stopped" eating fast food- positive reinforcement provided.  Patient denies further issues, concerns, or problems today. Iconfirmed that patient hasmy direct phone number, the main [facility] CM office phone number, and the [facility] CM 24-hour nurse advice phone number should issues arise prior to next scheduled [place] outreach. Encouraged patient to contact me directly if needs, questions, issues, or concerns arise prior to next scheduled  outreach; patient agreed  to do so.  Plan:  Patient will take medications as prescribed and will attend all scheduled provider appointments  Patient will consider promptly scheduling post-hospital discharge office visit with PCP  Patient will promptly notify care providers for any new concerns/ issues/ problems that arise  I will [place] referral for patient's stated need for financial assistance with newly prescribed medication  [facility] CM outreach to continue with scheduled phone call in 2 weeks  [facility] CM Care Plan Problem One     Most Recent Value  Care Plan Problem One  High risk for hospital readmission related to/ as evidenced by recent hospital admisison January 7-12, 2020 for COPD exacerbation   Role Documenting the Problem One  Care Management Coordinator  Care Plan for Problem One  Active  [facility] Long Term Goal   Over the next 31 days, patient will not experience hospital readmission, as evidenced by patient reporting and revie wof EMR during [place] outreach  [facility] Long Term Goal Start Date  09/15/18  Interventions for Problem One Long Term Goal  Discussed with patient her current clinical condition and confirmed that she does not have current clinical concerns,  encouraged patient to promptly notify care providers for any new concerns arise/ worsening of her laryngitis,  reviewed recent provider appointments with patient,  placed pharmacy referral for patient's stated need for financial assistance with newly prescribed medication  [facility] CM Short Term Goal #1   Over the next 21 days, patient will scheduled and attend post-hospital discharge appointment with PCP, as evidenced by patient reporting/ collaboration with PCP as indicated, during [place] outreach  [facility] CM Short Term Goal #1 Start Date  09/15/18  Interventions for Short Term Goal #1  Again encouraged patient to schedule PCP appointment for post-hospital discharge appointment    [facility] CM Care Plan Problem Two      Most Recent Value  Care Plan Problem Two  Self- health management of chronic disease state of COPD, as evidenced by patient reporting   Role Documenting the Problem Two  Care Management Coordinator  Care Plan for Problem Two  Active  Interventions for Problem Two Long Term Goal   Confirmed that patient received and has begun reviewing previously mailed printed EMMI education material,  using teach back method, discussed strategies for self-health management of chronic disease state of COPD and confirmed that patient is able to verbalize signs/ symptoms COPD exacerbation and corresponding action plan for COPD  [facility] Long Term Goal  Over the next 45 days, patient will verbalize three self-health management strategies for chronic disease state of COPD, as evidenced by patient reporting during [place] RN CM outreach  [facility] Long Term Goal Start Date  09/20/18  [facility] CM Short Term Goal #1   Over the next 30 days, patient will verbalize qualities of low sodium/ heart healthy diet along with 3 strategies to begin following prescribed diet, as evidenced by patient reporting during [place] RN CM outreach  [facility] CM Short Term Goal #1 Start Date  09/20/18  Interventions for Short Term Goal #2   Confirmed that patient received and has begun reviewing previously mailed printed education around diet for COPD/ CAD and discussed actions that the patient has taken thus far to begin following heart healthy low salt diet     Oneta Rack, RN, BSN, [place] Coordinator [facility] Care Management  [phone]

## 2018-10-05 ENCOUNTER — Other Ambulatory Visit: Payer: Self-pay | Admitting: Pharmacist

## 2018-10-05 NOTE — Patient Outreach (Signed)
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   Noted per EMR that [facility] office on 09/21/2018 reached out to patient regarding patient assistance programs for voriconazole as well as Symbicort and Synthroid and patient was to initiate voriconazole application through [place] PAP online.  Applications for Symbicort and Synthroid were mailed to patient.   Unsuccessful telephone call attempt #1 to patient. HIPAA compliant voicemail left requesting a return call  Plan:  I will mail patient an unsuccessful outreach letter, I will make another outreach attempt to patient within 3-4 business days   Ralene Bathe, PharmD, [place] [phone]

## 2018-10-10 ENCOUNTER — Other Ambulatory Visit: Payer: Self-pay | Admitting: Pharmacist

## 2018-10-10 NOTE — Patient Outreach (Signed)
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   Noted per EMR that Melissa Knox office on 09/21/2018 reached out to patient regarding patient assistance programs for voriconazole as well as Symbicort and Synthroid and patient was to initiate voriconazole application through [place] PAP online.  Applications for Symbicort and Synthroid were mailed to patient.   Successful call attempt #2 to Melissa Knox.  HIPAA identifiers verified.  Patient confirms that Melissa Knox is assisting her with medication assistance applications and she does not need additional support from [facility] pharmacist.  She has a f/u appointment next Thursday with the [place] office to turn in all her applications and documents.  She denies wanting to review medications telephonically.   Plan: [facility] pharmacy case is being closed due to the following reasons:  Goals have been met. -Patient has been provided [facility] CM contact information if assistance needed in the future.   -Thank you for allowing [facility] pharmacy to be involved in this patient's care.    Ralene Bathe, PharmD, [place] [phone]

## 2018-10-17 ENCOUNTER — Other Ambulatory Visit: Payer: Self-pay | Admitting: *Deleted

## 2018-10-17 ENCOUNTER — Encounter: Payer: Self-pay | Admitting: *Deleted

## 2018-10-17 NOTE — Patient Outreach (Signed)
Melissa Knox [facility]) Melissa Knox Telephone Outreach PCP office completes Transition of Care follow up post-hospital discharge Post-hospital discharge day # 37  10/17/2018  Melissa Knox [birth year] [id number]  Successful telephoneoutreach to Melissa Knox, 68 y/o female referred to [place] on 09/12/2018 by Melissa Knox liaison after patient experienced recent hospitalization January 7-12, 2020 for acute respiratory failure with hypoxia/ COPD after outpatient bronchoscopy on September 04, 2018. Patient was discharged home to self-care after refusing both SNF/ rehabilitation visit and home health services.Patient has history including, but not limited to, chronic anemia; HTN/ HLD; CHF; COPD, on home O2 at baseline; CKD- stage II.HIPAA/ identity verified with patient today.  Today, patient reports that she is "doinggreat"and states that she is "better" after previously reported voice hoarseness; patient states that this "comes and goes" around weather/ use of home O2.  Patient states she is not concerned about intermittent episodes of voice hoarseness, and she does not sound hoarse today.  Patient denies pain/ new- recent falls, and denies concerns/ issues/ problems around her clinical condition.  Patient sounds to be in no obvious distress throughout phone call today.  Patient further reports:  -- Has all medicationsand takes as prescribed;denies questions/ concernsabout current medications.  Discussed with patient previously placed [place] team referral, and she confirms that she spoke with [facility] Pharmacist and that she is currently working with pulmonary provider office staff around possibility of patient assistance program for voriconazole 200 mg BID.  Denies recent changes to medications.  -- No recent provider appointments; we reviewed upcoming scheduled appointment and she confirms that she plans to attend all and has reliable transportation.   Continues driving self to appointments; again encouraged patient to promptly notify care providers for any new concerns/ issues/ problems that arise. ---- 10/19/2018: "breathing doctor at [place]" ---- 11/15/2018: [facility] ---- 11/20/2018: PCP   Self-health management ofchronic disease state of COPD: --reports that she believes she continues "breathing fine."  Again confirmed that patient has reviewed previously provided printed educational material around signs/ symptoms concerning for COPD exacerbation/ yellow COPD zone- patient again verbalizes good general understanding of same, and is able to verbalize appropriate action plan for COPD exacerbation.  Denies recent clinical episodes concerning for COPD yellow zone. -- continues using home O2 at 2-3 L/min -- reports that she has reviewed low sodium diet printed educational material and has begun making "small changes whenever possible" to follow diet; patient denies questions around heart healthy diet -- previously reported that she did not regularly monitor/ record daily weights, but today, states that she has been monitoring and recording weights at home "about every day."  Discussed with patient rationale for regular weight monitoring and she is able to verbalize a good general understanding of weight gain guidelines in setting of CHF/ COPD along with corresponding action plan.  Patient denies further issues, concerns, or problems today. Iconfirmed that patient hasmy direct phone number, the main [facility] CM office phone number, and the [facility] [place] CM 24-hour nurse advice phone number should issues arise prior to next scheduled [place] outreach. Encouraged patient to contact me directly if needs, questions, issues, or concerns arise prior to next scheduled outreach; patient agreed to do so.  Plan:  Patient will take medications as prescribed and will attend all scheduled provider appointments  Patient will promptly notify care providers  for any new concerns/ issues/ problems that arise  Patient will follow action plan for yellow COPD zone as indicated  [place] outreach to continue with scheduled phone callnext month  [facility] CM Care Plan Problem One     Most Recent Value  Care Plan Problem One  High risk for hospital readmission related to/ as evidenced by recent hospital admisison January 7-12, 2020 for COPD exacerbation   Role Documenting the Problem One  Care Management Coordinator  Care Plan for Problem One  Not Active  THN Long Term Goal   Over the next 31 days, patient will not experience hospital readmission, as evidenced by patient reporting and revie wof EMR during [place] outreach  [facility] Long Term Goal Start Date  09/15/18  [facility] Long Term Goal Met Date  10/17/18 [facility] Met]  Interventions for Problem One Long Term Goal  Confirmed with patient that she has not experienced hospital readmission within 31 days of last hospital discharge,  discussed current clinical condition with patient and confirmed that she has no concerns around condition  [facility] CM Short Term Goal #1   Over the next 21 days, patient will scheduled and attend post-hospital discharge appointment with PCP, as evidenced by patient reporting/ collaboration with PCP as indicated, during [place] outreach  [facility] CM Short Term Goal #1 Start Date  09/15/18  [facility] CM Short Term Goal #1 Met Date  10/17/18 [facility] Not met]  Interventions for Short Term Goal #1  Confirmed with patient that she did schedule upcoming PCP appointment,  unfortunately, patient did not attend prior appointment with PCP, but did attend pulmonary provider appointment within 30 days of hospital discharge    [facility] CM Care Plan Problem Two     Most Recent Value  Care Plan Problem Two  Self- health management of chronic disease state of COPD, as evidenced by patient reporting   Role Documenting the Problem Two  Care Management Coordinator  Care Plan for Problem Two  Active  Interventions for  Problem Two Long Term Goal   Confirmed that patient is able to verbalize self-health management strategies for COPD: take medications as prescribed,  attend provider appointments,  use O2 at home as prescribed,  encouraged patient to continue reviewing previously provided EMMI educational material as needed/ indicated  [facility] Long Term Goal  Over the next 45 days, patient will verbalize three self-health management strategies for chronic disease state of COPD, as evidenced by patient reporting during [place] CM outreach  [facility] Long Term Goal Start Date  09/20/18  [facility] Long Term Goal Met Date  10/17/18 [Goal Met]  [facility] CM Short Term Goal #1   Over the next 30 days, patient will verbalize qualities of low sodium/ heart healthy diet along with 3 strategies to begin following prescribed diet, as evidenced by patient reporting during [place] RN CM outreach  [facility] CM Short Term Goal #1 Start Date  09/20/18  [facility] CM Short Term Goal #1 Met Date   10/17/18 [Goal Met]  Interventions for Short Term Goal #2   Confirmed with patient that she has reviewed previously provided printed educational material around low sodium diet,  confirmed that patient has initiated small steps to follow low sodium diet and that she has no specific questions around heart healthy/ low sodium diet  [facility] CM Short Term Goal #2   Over the next 30 days, patient will attend all scheduled provider appointments, as evidenced by patient reporting and collaboration with care providers as indicated during [facility] RN CCM outreach  [facility] CM Short Term Goal #2 Start Date  10/17/18  Interventions  for Short Term Goal #2  Reviewed with patient upcoming provider appointments and confirmed that she has reliable transportation and plans to attend all as scheduled      Oneta Rack, RN, BSN, [place] Coordinator [facility] Care Management  [phone]

## 2018-10-19 DIAGNOSIS — J849 Interstitial pulmonary disease, unspecified: Secondary | ICD-10-CM | POA: Diagnosis not present

## 2018-10-19 DIAGNOSIS — J449 Chronic obstructive pulmonary disease, unspecified: Secondary | ICD-10-CM | POA: Diagnosis not present

## 2018-10-19 DIAGNOSIS — Z79899 Other long term (current) drug therapy: Secondary | ICD-10-CM | POA: Diagnosis not present

## 2018-10-19 DIAGNOSIS — J9621 Acute and chronic respiratory failure with hypoxia: Secondary | ICD-10-CM | POA: Diagnosis not present

## 2018-10-23 DIAGNOSIS — Z79899 Other long term (current) drug therapy: Secondary | ICD-10-CM | POA: Diagnosis not present

## 2018-10-31 DIAGNOSIS — D631 Anemia in chronic kidney disease: Secondary | ICD-10-CM | POA: Diagnosis not present

## 2018-10-31 DIAGNOSIS — N2581 Secondary hyperparathyroidism of renal origin: Secondary | ICD-10-CM | POA: Diagnosis not present

## 2018-10-31 DIAGNOSIS — N183 Chronic kidney disease, stage 3 (moderate): Secondary | ICD-10-CM | POA: Diagnosis not present

## 2018-10-31 DIAGNOSIS — I10 Essential (primary) hypertension: Secondary | ICD-10-CM | POA: Diagnosis not present

## 2018-11-13 NOTE — Progress Notes (Deleted)
Patient ID: Melissa Knox, female    DOB: 21-Apr-1951, 68 y.o.   MRN: [id number]  HPI  Melissa Knox is a 68 y/o female with a history of CAD, CKD, hyperlipidemia, HTN, hypothyroidism, COPD, pulmonary nodule, previous tobacco use and chronic heart failure.  Echo done 01/04/18 which shows an EF of 62% along with mild MR and moderate TR. Echo report from 10/04/16 reviewed and showed an EF of 80% along with a PA pressure of 33 mm Hg.  Admitted 09/05/2018 due to COPD exacerbation and pneumonia. Was intubated and given antibiotics, IV steroids and bronchodilators. Had been vaping. Given 2 units of PRBC's due to anemia. Discharged with oral prednisone after 5 days.    She presents today for a follow-up visit with a chief complaint of   Past Medical History:  Diagnosis Date  . Atherosclerotic heart disease 06/12/2015  . CHF (congestive heart failure) ([place])   . Chronic congestive heart failure with left ventricular diastolic dysfunction ([place]) 04/12/2018  . CKD (chronic kidney disease) stage 2, GFR 60-89 ml/min 06/12/2015  . COPD (chronic obstructive pulmonary disease) ([place])   . Hypercalcemia 06/12/2015  . Hyperlipidemia 06/12/2015  . Hypertension 06/12/2015  . Hypothyroidism 06/12/2015  . Insomnia 06/12/2015  . Interstitial pneumonia ([place]) 04/12/2018  . Pulmonary heart disease ([place]) 06/12/2015  . Shortness of breath 06/12/2015  . Solitary pulmonary nodule 06/12/2015   Past Surgical History:  Procedure Laterality Date  . GALLBLADDER SURGERY  2012   Family History  Problem Relation Age of Onset  . Cancer Mother   . Hypertension Father   . Diabetes Father   . Cancer Father    Social History   Tobacco Use  . Smoking status: Former Smoker    Last attempt to quit: 2013    Years since quitting: 7.2  . Smokeless tobacco: Never Used  Substance Use Topics  . Alcohol use: No    Alcohol/week: 0.0 standard drinks   No Known Allergies    Review of Systems  Constitutional: Positive for fatigue.  Negative for appetite change.  HENT: Positive for postnasal drip and rhinorrhea. Negative for congestion and sore throat.   Eyes: Negative.   Respiratory: Positive for cough (productive) and shortness of breath (with moderate exertion). Negative for chest tightness and wheezing.   Cardiovascular: Negative for chest pain, palpitations and leg swelling.  Gastrointestinal: Negative for abdominal distention and abdominal pain.  Endocrine: Negative.   Genitourinary: Negative.   Musculoskeletal: Positive for arthralgias (left hip pain) and back pain.  Skin: Negative.   Allergic/Immunologic: Negative.   Neurological: Negative for dizziness and light-headedness.  Hematological: Negative for adenopathy. Does not bruise/bleed easily.  Psychiatric/Behavioral: Negative for dysphoric mood and sleep disturbance (wearing oxygen @ 2L; sleeping on 1 pillow). The patient is not nervous/anxious.      Physical Exam  Constitutional: She is oriented to person, place, and time. She appears well-developed and well-nourished.  HENT:  Head: Normocephalic and atraumatic.  Neck: Normal range of motion. Neck supple. No JVD present.  Cardiovascular: Normal rate and regular rhythm.  Pulmonary/Chest: Effort normal. She has no wheezes. She has no rales.  Abdominal: Soft. She exhibits no distension. There is no abdominal tenderness.  Musculoskeletal:        General: No tenderness or edema.  Neurological: She is alert and oriented to person, place, and time.  Skin: Skin is warm and dry.  Psychiatric: She has a normal mood and affect. Her behavior is normal. Thought content normal.  Nursing note and vitals  reviewed.  Assessment & Plan:   1: Chronic heart failure with preserved ejection fraction- - NYHA class II - euvolemic today - weighing daily and she says that her weight has been stable. Reminded to call for an overnight weight gain of >2 pounds or a weekly weight gain of >5 pounds - weight  - not adding salt  and has been reading food labels. Reviewed the importance of closely following a 2000mg  sodium diet  - has been walking 3 times / week for 30 minutes each time - BNP 09/05/2018 was 431.0 - PharmD reconciled medications with the patient - has not received flu vaccine yet for this season  2: HTN- - BP - saw PCP Melissa Knox) 04/18/18 - BMP from 09/22/2018 reviewed and shows sodium 140, potassium 4.2, creatinine 1.47 and GFR 42. - follows with nephrology Melissa Knox)   3: COPD-  - wears oxygen at 2L around the clock - can use her nebulizer 3 times/ day - saw pulmonologist Melissa Knox) 03/23/18  Patient did not bring her medications nor a list. Each medication was verbally reviewed with the patient and she was encouraged to bring the bottles to every visit to confirm accuracy of list.

## 2018-11-15 ENCOUNTER — Ambulatory Visit: Payer: Medicare Other | Admitting: Family

## 2018-11-15 ENCOUNTER — Telehealth: Payer: Self-pay | Admitting: Family

## 2018-11-15 NOTE — Telephone Encounter (Signed)
Patient did not show for her Heart Failure Clinic appointment on 11/15/2018. Will attempt to reschedule.

## 2018-11-16 ENCOUNTER — Encounter: Payer: Self-pay | Admitting: *Deleted

## 2018-11-16 ENCOUNTER — Other Ambulatory Visit: Payer: Self-pay | Admitting: *Deleted

## 2018-11-16 NOTE — Patient Outreach (Signed)
[facility]) Care Management [facility] CM Telephone Outreach Post-hospital discharge day # 67 Unsuccessful (consecutive) outreach attempt # 1- previously engaged patient  11/16/2018  [name] Apr 26, 1951 [id number]  10:15 am:  Unsuccessful telephoneoutreach to [name], 68 y/o female referred to [place] on 09/12/2018 by [facility] liaison after patient experienced recent hospitalization January 7-12, 2020 for acute respiratory failure with hypoxia/ COPD after outpatient bronchoscopy on September 04, 2018. Patient was discharged home to self-care after refusing both SNF/ rehabilitation visit and home health services.Patient has history including, but not limited to, chronic anemia; HTN/ HLD; CHF; COPD, on home O2 at baseline; CKD- stage II.  HIPAA compliant voice mail message left for patient, requesting return call back.  Plan:  Will re-attempt [place] telephone outreach within 4 business days if I not hear back from patient first.  Oneta Rack, RN, BSN, [place] Coordinator [facility] Care Management  [phone]

## 2018-11-20 ENCOUNTER — Ambulatory Visit: Payer: Self-pay | Admitting: Nurse Practitioner

## 2018-11-21 ENCOUNTER — Other Ambulatory Visit: Payer: Self-pay | Admitting: *Deleted

## 2018-11-21 ENCOUNTER — Encounter: Payer: Self-pay | Admitting: *Deleted

## 2018-11-21 NOTE — Patient Outreach (Signed)
[name]) Care Management [place] Telephone Outreach Post-hospital discharge day # 72 Unsuccessful (consecutive) phone outreach # 2- previously engaged patient  11/21/2018  Melissa Knox 1951/06/27 [id number]  Unsuccessful telephoneoutreach to [name], 68 y/o female referred to [name] on 09/12/2018 by [facility] liaison after patient experienced recent hospitalization January 7-12, 2020 for acute respiratory failure with hypoxia/ COPD after outpatient bronchoscopy on September 04, 2018. Patient was discharged home to self-care after refusing both SNF/ rehabilitation visit and home health services.Patient has history including, but not limited to, chronic anemia; HTN/ HLD; CHF; COPD, on home O2 at baseline; CKD- stage II.  HIPAA compliant voice mail message left for patient, requesting return call back.  Plan:  [place] [facility] Community CM unsuccessful patient outreach letter in mail requesting call back in writing  Will re-attempt [place] telephone outreach with 4 business days if I do not hear back from patient first.  Oneta Rack, RN, BSN, [employer] Coordinator [facility] Care Management  [phone]

## 2018-11-27 ENCOUNTER — Encounter: Payer: Self-pay | Admitting: *Deleted

## 2018-11-27 ENCOUNTER — Other Ambulatory Visit: Payer: Self-pay | Admitting: *Deleted

## 2018-11-27 NOTE — Patient Outreach (Signed)
[facility]) Care Management [place] Telephone Outreach Post-hospital discharge day # 78  11/27/2018  Melissa Knox [birth year] [id number]  Successful telephoneoutreach to Melissa Knox, 68 y/o female referred to [place] on 09/12/2018 by [facility] liaison after patient experienced hospitalization January 7-12, 2020 for acute respiratory failure with hypoxia/ COPD after outpatient bronchoscopy on September 04, 2018. Patient was discharged home to self-care after refusing both SNF/ rehabilitation visit and home health services.Patient has history including, but not limited to, chronic anemia; HTN/ HLD; CHF; COPD, on home O2 at baseline; CKD- stage II.HIPAA/ identity verified with patient today.  Today, patient reports that she continues "doinggreat"and states that she is "having no problems."  Patient denies pain and new/ recent falls and sounds to be in no distress throughout phone call today.  Reports that she is following precautions around COVID-19 and she denies fever, cough, upper respiratory symptoms.  Discussed action plan with patient to promptly notify her PCP by phone should symptoms develop, and she verbalizes understanding and agreement.  Patient reports that she continues taking care of her granddaughter, who is now out of school due to COVID precautions in the community.   Patient further reports:  -- Has all medicationsand takes as prescribed;denies questions/ concernsabout current medications.  reports that she continues receiving assistance form her pulmonology team to obtain patient assistance program for voriconazole 200 mg BID- reports today that she "has plenty" and Is not concerned" about this medication.  Denies recent changes to medications.  -- No recentprovider appointments due to community precautions in setting of COVID-19; again encouraged patient to promptly notify care providers for any new concerns/ issues/ problems that  arise.  Reports attended last appointment with pulmonology provider at [facility] on 10/19/2018  Self-health management ofchronic disease state of COPD: --reportsthat she continues"breathing just fine."  Reviewed previously provided printed educational material around signs/ symptoms concerning for COPD exacerbation/ yellow COPD zone- patientagainverbalizes good general understanding of same with minimal prompting, and is able to independently verbalize appropriate action plan for COPD exacerbation.  Denies recent clinical episodes concerning for COPD yellow zone. -- continues using home O2 at 2-3 L/min; monitoring weights at home "regularly;" patient declines review of recent weights, stating that they have "all been steady," and that she does not have her recorded weights in front of her; she is able to verbalize a good general understanding of weight gain guidelines in setting of CHF/ COPD along with corresponding action plan.  Patient denies further issues, concerns, or problems today. We discussed that patient has thus far made excellent progress in meeting her previously established [place] CM goals, and today she denies current care coordination needs; we discussed possibility of [place] case closure with transfer to [place] at time of next scheduled outreach next month.  Patient verbalizes today that she does not believe that she needs ongoing follow up with [place], however, I encouraged her to consider this to ensure that she has ongoing [facility] CM support; patient stated that she would consider.    Iconfirmed that patient hasmy direct phone number, the main [facility] CM office phone number, and the [facility] CM 24-hour nurse advice phone number should issues arise prior to next scheduled [place] outreach. Encouraged patient to contact me directly if needs, questions, issues, or concerns arise prior to next scheduled outreach next month; patient agreed to do  so.  Plan:  Patient will take medications as prescribed  and will attend all scheduled provider appointments  Patient will promptly notify care providers for any new concerns/ issues/ problems that arise  Patient will follow action plan for yellow COPD zone as indicated  [facility] CM outreach to continue with scheduled phone callnext month, possibly for case closure with transfer to [place] Problem Two     Most Recent Value  Care Plan Problem Two  Self- health management of chronic disease state of COPD, as evidenced by patient reporting   Role Documenting the Problem Two  Care Management Coordinator  Care Plan for Problem Two  Active  [facility] CM Short Term Goal #1   Over the next 30 days, patient will promptly notify her established health care providers for any new concerns/ issues/ problems that arise, as evidenced by patient reporting and collaboration with care providers as indicated during [place] outreach  [facility] CM Short Term Goal #1 Start Date  11/27/18  Interventions for Short Term Goal #2   Discussed with patient her current clinical condition and confirmed that she has no current clinical concerns,  confirmed that she continues working with pulmonogist at [facility] to obtain her new prescription medication and that she has continued taking medications as prescribed, without recent changes.  Discussed COVID-19 precautions/ signs- symptoms along with corresponding action plan with patient.  Encouraged patient to promptly notify care providers for any new concerns that arise,  confirmed that patient has contact information for care providers  [facility] CM Short Term Goal #2   Over the next 30 days, patient will attend all scheduled provider appointments, as evidenced by patient reporting and collaboration with care providers as indicated during [facility] RN CCM outreach  [facility] CM Short Term Goal #2 Start Date  10/17/18  [facility] CM Short Term Goal #2 Met Date   11/27/18 [Goal partially met]  Interventions for Short Term Goal #2  Discussed with patient her recently cancelled provider appointments secondary to COVID-19 restrictions currently in place,  encouraged patient to maintain contact with care providers for any new concerns that arise,  encouraged patient to re-schedule missed appointments promptly once precautions around [place] subside     Oneta Rack, RN, BSN, [employer] Coordinator [facility] Care Management  [phone]

## 2018-12-13 ENCOUNTER — Other Ambulatory Visit: Payer: Self-pay | Admitting: Nurse Practitioner

## 2018-12-13 MED ORDER — ATORVASTATIN CALCIUM 10 MG PO TABS: 10.0000 mg | ORAL_TABLET | Freq: Every day | ORAL | 0 refills | Status: DC

## 2018-12-13 MED ORDER — LEVOTHYROXINE SODIUM 50 MCG PO TABS: 50.0000 ug | ORAL_TABLET | Freq: Every day | ORAL | 0 refills | Status: DC

## 2018-12-13 NOTE — Telephone Encounter (Signed)
Lm for patient needs appointment for refills

## 2018-12-17 IMAGING — CT CT CHEST HIGH RESOLUTION W/O CM
2 of 7 series · 13 of 36 positions shown, 16 images · non-contrast
Comparison: 04/11/2018 and additional CT chest examinations dating
back to 08/24/2012.

CLINICAL DATA: Worsening shortness of breath on exertion for 1
year.

EXAM:
CT CHEST WITHOUT CONTRAST
TECHNIQUE: Multidetector CT imaging of the chest was performed following the
standard protocol without intravenous contrast. High resolution
imaging of the lungs, as well as inspiratory and expiratory imaging,
was performed.

[Series 8: thorax · coronal · 0.57mm/px · 3 of 135 slices shown]
[im 27/135  lung]
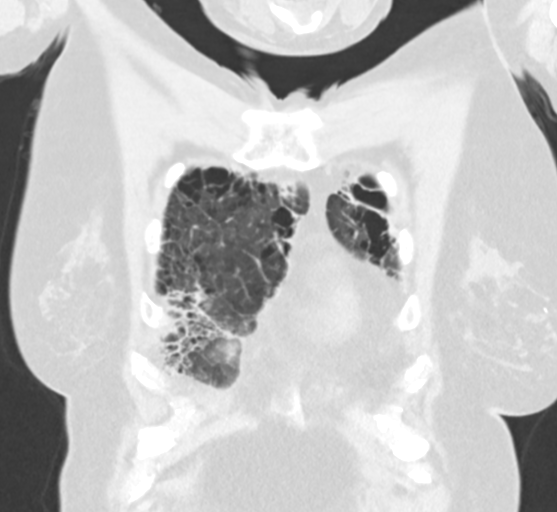
[im 54/135  lung]
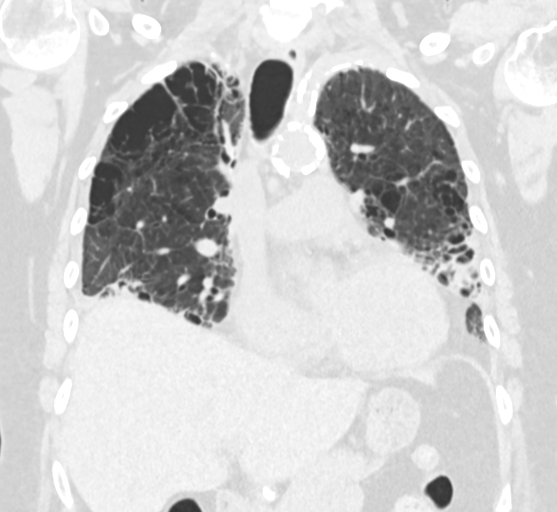
[im 81/135  lung]
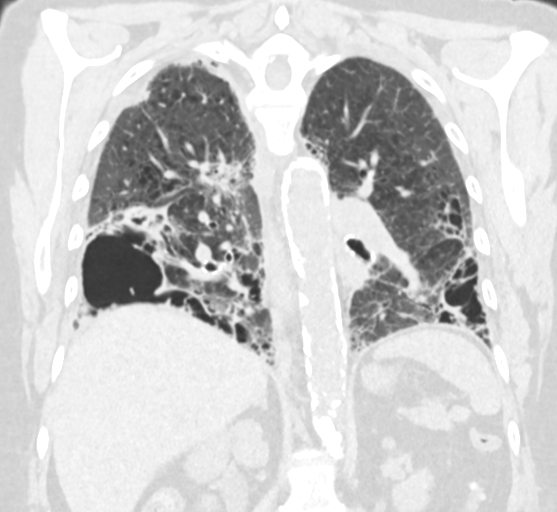

[Series 15: high res (id) thorax · axial · 0.53mm/px · z∈[-1151,-907]mm · 10 of 290 slices shown, 13 images]
[im 23/290  mediastinal]
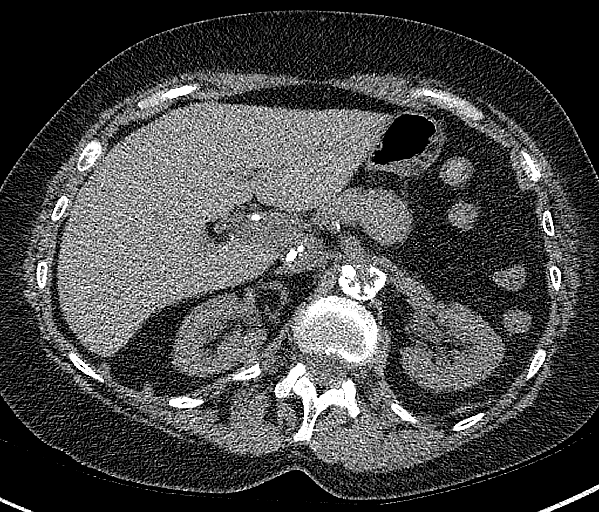
[im 23/290  lung]
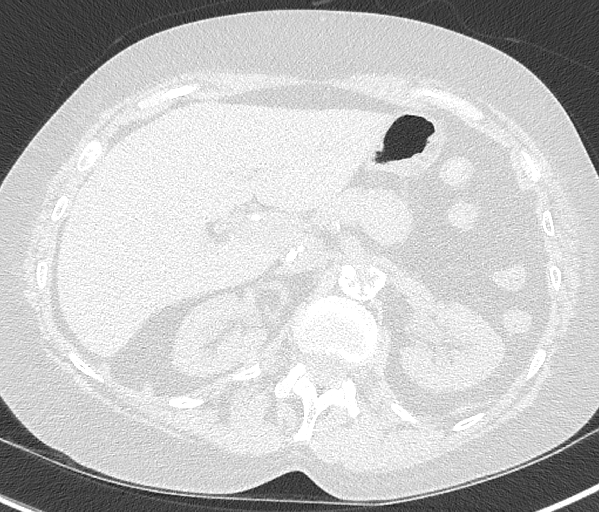
[im 45/290  lung]
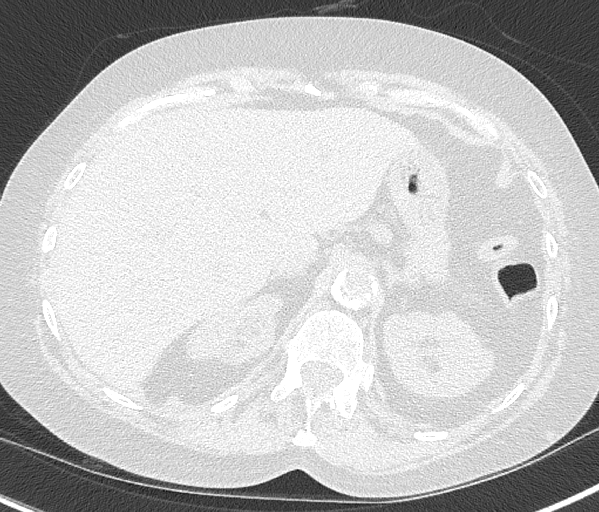
[im 89/290  lung]
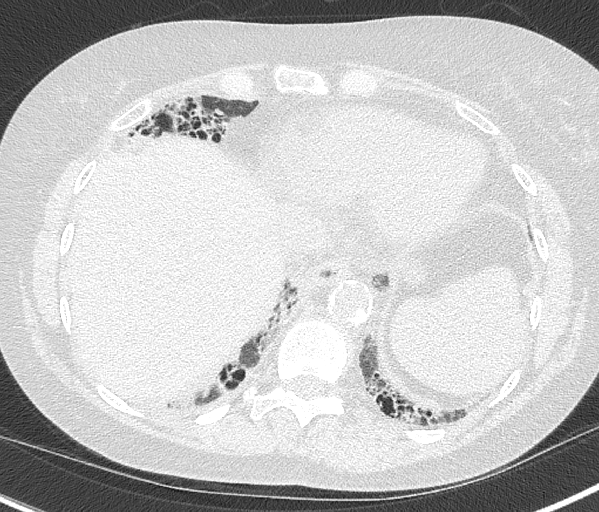
[im 112/290  lung]
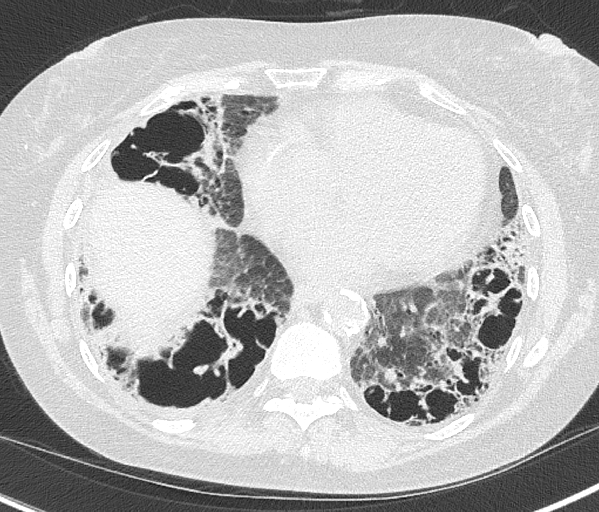
[im 134/290  mediastinal]
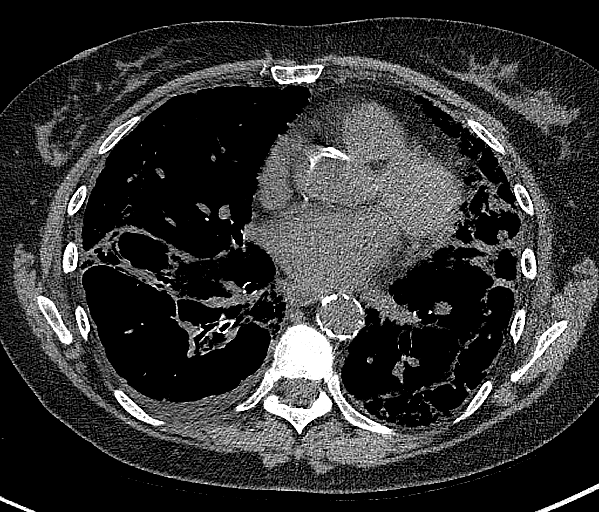
[im 134/290  lung]
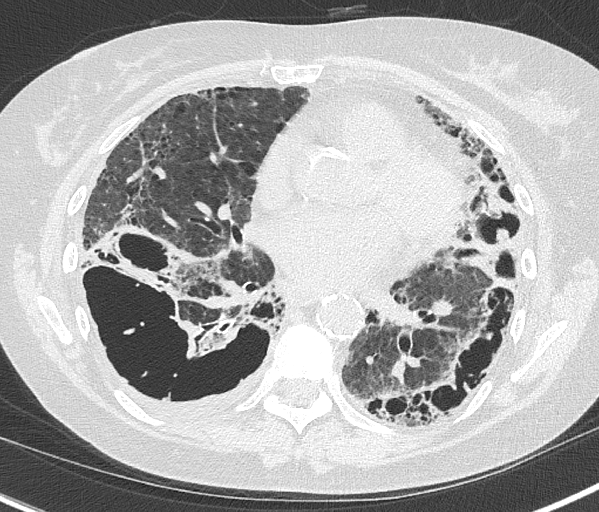
[im 156/290  lung]
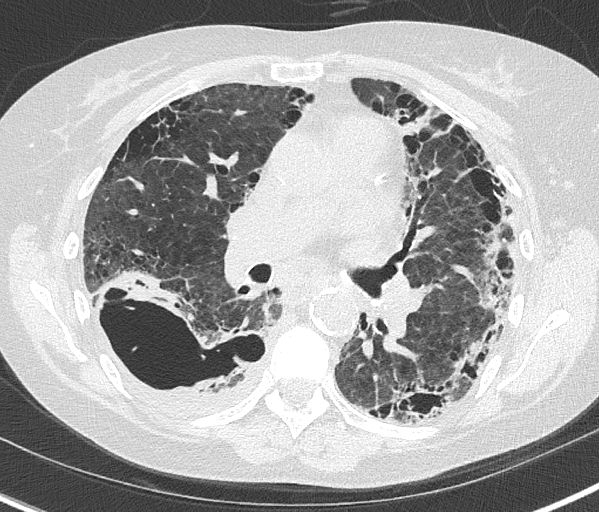
[im 178/290  lung]
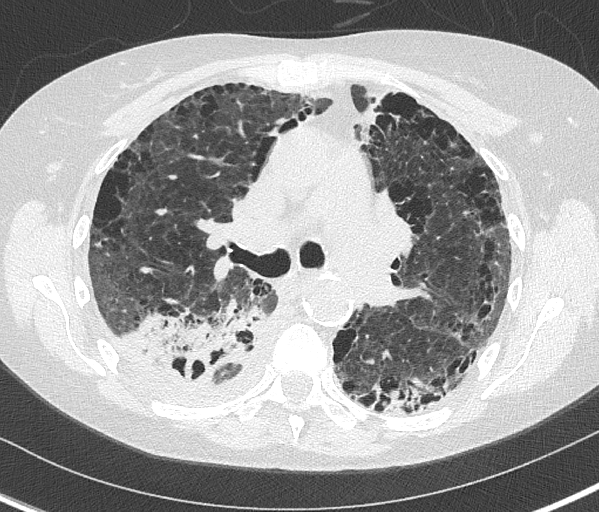
[im 223/290  lung]
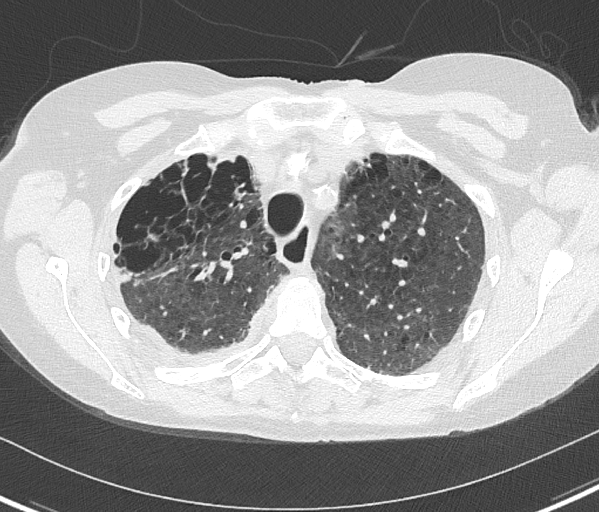
[im 245/290  mediastinal]
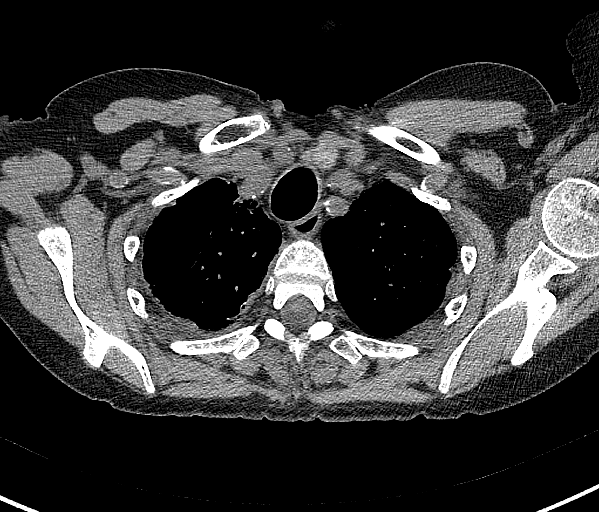
[im 245/290  lung]
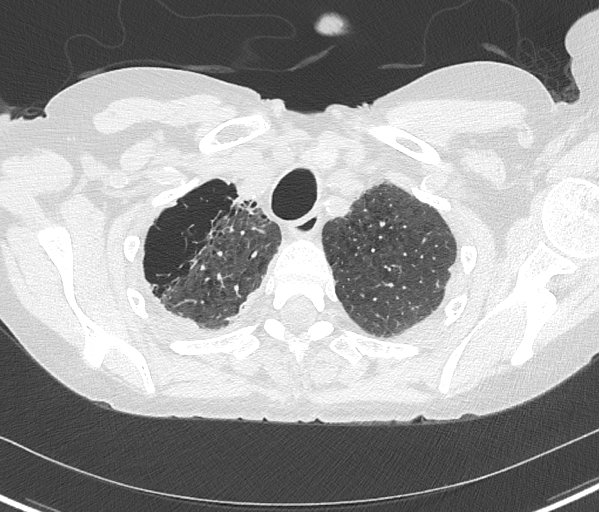
[im 267/290  lung]
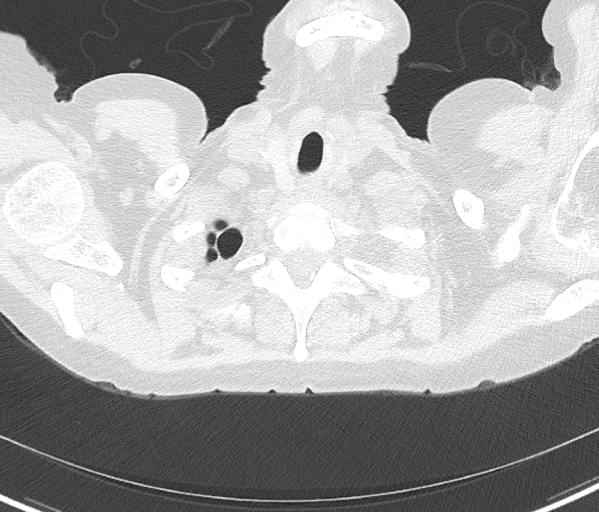

[13 of 36 positions shown; findings below may reference images not displayed]

FINDINGS: Cardiovascular: Atherosclerotic calcification of the arterial
vasculature, including coronary arteries and aortic valve. Heart is
enlarged. No pericardial effusion.

Mediastinum/Nodes: Mediastinal lymph nodes measure up to 1.5 cm in
the low right paratracheal station, similar. Hilar regions are
difficult to evaluate without IV contrast. No axillary adenopathy.
Fluid is seen in the esophagus.

Lungs/Pleura: Bullous centrilobular and paraseptal emphysema.
Consolidation and ground-glass in the posterior segment right upper
lobe and superior segment right lower lobe are similar to 04/11/2018
but appear rather progressive from 05/28/2017. Basilar predominant
traction bronchiectasis/bronchiolectasis. Small right pleural
effusion, minimally increased from 04/11/2018. Airway is otherwise
unremarkable.

Upper Abdomen: Visualized portions of the liver and adrenal glands
are unremarkable. Punctate stone in the right kidney. Scarring in
the upper pole right kidney. Visualized portions of the kidneys,
spleen, pancreas, stomach and bowel are grossly unremarkable.
Cholecystectomy. Calcified lymph nodes in the periportal region.

Musculoskeletal: No worrisome lytic or sclerotic lesions.
IMPRESSION: 1. Patchy area of consolidation and ground-glass in the posterior
segment right upper lobe and superior segment right lower lobe,
grossly stable from 04/11/2018 but progressive from 05/28/2017.
Findings are worrisome for primary bronchogenic carcinoma.
2. Small right pleural effusion, minimally increased from
04/11/2018.
3. Severe bullous emphysema (UNQNC-390.A), with basilar predominant
traction bronchiectasis/bronchiolectasis.
4. Aortic atherosclerosis (UNQNC-170.0). Coronary artery
calcification.
5. Punctate right renal stone.

## 2018-12-20 ENCOUNTER — Other Ambulatory Visit: Payer: Self-pay | Admitting: *Deleted

## 2018-12-20 ENCOUNTER — Encounter: Payer: Self-pay | Admitting: *Deleted

## 2018-12-20 NOTE — Patient Outreach (Signed)
[place] [facility]) Care Management [place] Telephone Outreach Post-hospital discharge day # 101  Unsuccessful (consecutive) attempt # 1- previously engaged patient  12/20/2018  Melissa Knox [birth year] [id number]  11:00 am:  Unsuccessful telephoneoutreach to [name], 68 y/o female referred to [place] on 09/12/2018 by [facility] liaison after patient experienced hospitalization January 7-12, 2020 for acute respiratory failure with hypoxia/ COPD after outpatient bronchoscopy on September 04, 2018. Patient was discharged home to self-care after refusing both SNF/ rehabilitation visit and home health services.Patient has history including, but not limited to, chronic anemia; HTN/ HLD; CHF; COPD, on home O2 at baseline; CKD- stage II.  HIPAA compliant voice mail message left for patient, requesting return call back.  Plan:  Will re-attempt [name] telephone outreach again tomorrow if I do not hear back from patient first.  Oneta Rack, RN, BSN, [place] Coordinator [facility] Care Management  [phone]

## 2018-12-21 ENCOUNTER — Encounter: Payer: Self-pay | Admitting: *Deleted

## 2018-12-21 ENCOUNTER — Other Ambulatory Visit: Payer: Self-pay | Admitting: *Deleted

## 2018-12-21 NOTE — Patient Outreach (Signed)
[place] [facility]) Care Management [place] Telephone Outreach Post-hospital discharge day # 102 Unsuccessful consecutive outreach attempt # 2- previously engaged patient  12/21/2018  Melissa Knox December 19, 1950 [id number]  Unsuccessful telephoneoutreach to [name], 68 y/o female referred to [place] on 09/12/2018 by [facility] liaison after patient experiencedhospitalization January 7-12, 2020 for acute respiratory failure with hypoxia/ COPD after outpatient bronchoscopy on September 04, 2018. Patient was discharged home to self-care after refusing both SNF/ rehabilitation visit and home health services.Patient has history including, but not limited to, chronic anemia; HTN/ HLD; CHF; COPD, on home O2 at baseline; CKD- stage II.  HIPAA compliant voice mail message left for patient, requesting return call back.  Plan:  Will place [facility] Community CM unsuccessful patient outreach letter in mail requesting call back in writing, as this is the second unsuccessful consecutive outreach attempt in previously engaged patient  Will re-attempt [place] telephone outreach again tomorrow if I do not hear back from patient later today.  Oneta Rack, RN, BSN, [employer] [facility] Care Management  [phone]

## 2018-12-22 ENCOUNTER — Encounter: Payer: Self-pay | Admitting: *Deleted

## 2018-12-22 ENCOUNTER — Other Ambulatory Visit: Payer: Self-pay | Admitting: *Deleted

## 2018-12-22 NOTE — Patient Outreach (Addendum)
[facility]) Care Management [place] Telephone Outreach Post-hospital discharge day # 103 Third Unsuccessful consecutive outreach attempt without patient call-back  12/22/2018  Melissa Knox 10-27-1950 [id number]  11:50 am:  Unsuccessful third telephoneoutreach attempt to [name], 68 y/o female referred to [place] on 09/12/2018 by [facility] liaison after patient experienced hospitalization January 7-12, 2020 for acute respiratory failure with hypoxia/ COPD after outpatient bronchoscopy on September 04, 2018. Patient was discharged home to self-care after refusing both SNF/ rehabilitation visit and home health services.Patient has history including, but not limited to, chronic anemia; HTN/ HLD; CHF; COPD, on home O2 at baseline; CKD- stage II.HIPAA/ identity verified with patient today.  HIPAA compliant voice mail message left for patient, requesting return call back.  Plan:  Verified [place] unsuccessful patient outreach letter in mail yesterday requesting call back in writing  Will move to [place] case closure if I do not hear back from patient within 10 business days of original unsuccessful outreach attempt on December 20, 2018  Oneta Rack, RN, BSN, [employer] [facility] Care Management  [phone]

## 2019-01-01 ENCOUNTER — Other Ambulatory Visit: Payer: Self-pay | Admitting: Internal Medicine

## 2019-01-01 DIAGNOSIS — J301 Allergic rhinitis due to pollen: Secondary | ICD-10-CM

## 2019-01-02 ENCOUNTER — Other Ambulatory Visit: Payer: Self-pay | Admitting: *Deleted

## 2019-01-02 NOTE — Patient Outreach (Signed)
[place] [facility]) Care Management  01/02/2019  Melissa Knox [birth year] [id number]  [place] CM Case Closure note re:  Melissa Knox, 68 y/o female referred to [place] on 09/12/2018 by [facility] liaison after patient experiencedhospitalization January 7-12, 2020 for acute respiratory failure with hypoxia/ COPD after outpatient bronchoscopy on September 04, 2018. Patient was discharged home to self-care after refusing both SNF/ rehabilitation visit and home health services.Patient has history including, but not limited to, chronic anemia; HTN/ HLD; CHF; COPD, on home O2 at baseline; CKD- stage II.  To date, it has been 10 business days since initial unsuccessful outreach attempt without patient call back after leaving voice mail messages and sending letter requesting call back in writing.  Plan:  Will close [place] CM case, as patient has not responded to multiple outreach attempts, and will make patient's PCP aware of same.   Oneta Rack, RN, BSN, [employer] [facility] Care Management  [phone]

## 2019-01-08 ENCOUNTER — Other Ambulatory Visit: Payer: Self-pay

## 2019-01-08 MED ORDER — ATORVASTATIN CALCIUM 10 MG PO TABS: 10.0000 mg | ORAL_TABLET | Freq: Every day | ORAL | 3 refills | Status: DC

## 2019-01-17 ENCOUNTER — Other Ambulatory Visit: Payer: Self-pay

## 2019-01-17 MED ORDER — PANTOPRAZOLE SODIUM 40 MG PO TBEC: 40.0000 mg | DELAYED_RELEASE_TABLET | Freq: Two times a day (BID) | ORAL | 0 refills | Status: DC

## 2019-01-30 ENCOUNTER — Other Ambulatory Visit: Payer: Self-pay | Admitting: Nurse Practitioner

## 2019-01-30 DIAGNOSIS — F329 Major depressive disorder, single episode, unspecified: Secondary | ICD-10-CM

## 2019-01-31 ENCOUNTER — Other Ambulatory Visit: Payer: Self-pay

## 2019-01-31 MED ORDER — SUCRALFATE 1 G PO TABS: 1.0000 g | ORAL_TABLET | Freq: Three times a day (TID) | ORAL | 1 refills | Status: DC

## 2019-01-31 NOTE — Telephone Encounter (Signed)
Please find out what is the exact dose pt is taking, it is ok to do 90 day supply then

## 2019-02-12 ENCOUNTER — Ambulatory Visit: Payer: Self-pay | Admitting: Internal Medicine

## 2019-02-15 ENCOUNTER — Encounter: Payer: Self-pay | Admitting: Nurse Practitioner

## 2019-02-15 ENCOUNTER — Ambulatory Visit (INDEPENDENT_AMBULATORY_CARE_PROVIDER_SITE_OTHER): Payer: Medicare Other | Admitting: Nurse Practitioner

## 2019-02-15 ENCOUNTER — Other Ambulatory Visit: Payer: Self-pay

## 2019-02-15 VITALS — BP 146/64 | HR 72 | Resp 16 | Ht 61.0 in | Wt 142.6 lb

## 2019-02-15 DIAGNOSIS — J449 Chronic obstructive pulmonary disease, unspecified: Secondary | ICD-10-CM | POA: Diagnosis not present

## 2019-02-15 DIAGNOSIS — Z9981 Dependence on supplemental oxygen: Secondary | ICD-10-CM | POA: Diagnosis not present

## 2019-02-15 DIAGNOSIS — K279 Peptic ulcer, site unspecified, unspecified as acute or chronic, without hemorrhage or perforation: Secondary | ICD-10-CM | POA: Diagnosis not present

## 2019-02-15 DIAGNOSIS — I10 Essential (primary) hypertension: Secondary | ICD-10-CM | POA: Diagnosis not present

## 2019-02-15 DIAGNOSIS — E782 Mixed hyperlipidemia: Secondary | ICD-10-CM | POA: Diagnosis not present

## 2019-02-15 MED ORDER — AMLODIPINE BESYLATE 10 MG PO TABS: 10.0000 mg | ORAL_TABLET | Freq: Every day | ORAL | 5 refills | Status: DC

## 2019-02-15 MED ORDER — ATORVASTATIN CALCIUM 10 MG PO TABS: 10.0000 mg | ORAL_TABLET | Freq: Every day | ORAL | 5 refills | Status: DC

## 2019-02-15 MED ORDER — PANTOPRAZOLE SODIUM 40 MG PO TBEC: 40.0000 mg | DELAYED_RELEASE_TABLET | Freq: Two times a day (BID) | ORAL | 5 refills | Status: DC

## 2019-02-15 NOTE — Progress Notes (Signed)
[facility] [place], [place] [address]  Internal MEDICINE  Office Visit Note  Patient Name: Melissa Knox  [id number]  [id number]  Date of Service: 02/17/2019  Chief Complaint  Patient presents with  . Medical Management of Chronic Issues    4 month follow up  . Hyperlipidemia  . Hypertension  . Colonoscopy    poss cologuard. pt does not want to go to hospital now.     The patient is here for routine follow up visit. She states that she is doing well. She has been staying at home and limiting her exposure to public places due to fear of contracting COVID 19. She continues to use oxygen at 2 liters per minute at all times. Her blood pressure is generally well controlled. Slightly elevated today as she has not taken her morning medications yet today. She does need to have refills for several routine medications today. She is due to have a colon cancer screening. She would prefer to have Cologuard test. She has no known family history for colon cancer.       Current Medication: Outpatient Encounter Medications as of 02/15/2019  Medication Sig Note  . albuterol (PROVENTIL) (2.5 MG/3ML) 0.083% nebulizer solution Take 2.5 mg by nebulization every 6 (six) hours as needed for wheezing or shortness of breath.   Marland Kitchen amLODipine (NORVASC) 10 MG tablet Take 1 tablet (10 mg total) by mouth daily.   Marland Kitchen atorvastatin (LIPITOR) 10 MG tablet Take 1 tablet (10 mg total) by mouth daily at 6 PM.   . budesonide-formoterol (SYMBICORT) 160-4.5 MCG/ACT inhaler Inhale 2 puffs into the lungs 2 (two) times daily.   . carvedilol (COREG) 6.25 MG tablet Take 1 tablet (6.25 mg total) by mouth 2 (two) times daily.   Marland Kitchen docusate sodium (COLACE) 100 MG capsule Take 1 capsule (100 mg total) by mouth 2 (two) times daily as needed for mild constipation. 09/20/2018: Reports has not needed recently  . furosemide (LASIX) 20 MG tablet Take 1 tablet (20 mg total) by mouth daily.   Marland Kitchen levothyroxine  (SYNTHROID, LEVOTHROID) 50 MCG tablet Take 1 tablet (50 mcg total) by mouth daily before breakfast.   . loratadine (CLARITIN) 10 MG tablet Take one tab po qd for allergies as needed   . losartan (COZAAR) 100 MG tablet Take 100 mg by mouth daily. 09/20/2018: Patient reports "kidney doctor restarted in November 2019"  . meclizine (ANTIVERT) 12.5 MG tablet Take 12.5 mg by mouth 3 (three) times daily as needed for dizziness.  09/20/2018: Patient reports has not needed recently   . mirtazapine (REMERON) 15 MG tablet TAKE 1/2 OF A TABLET (7.5 MG TOTAL) BY MOUTH AT BEDTIME.   Marland Kitchen OXYGEN Inhale 3 L into the lungs.   . pantoprazole (PROTONIX) 40 MG tablet Take 1 tablet (40 mg total) by mouth 2 (two) times daily.   . sucralfate (CARAFATE) 1 g tablet Take 1 tablet (1 g total) by mouth 4 (four) times daily -  with meals and at bedtime.   . voriconazole (VFEND) 200 MG tablet Take 200 mg by mouth 2 (two) times daily. 10/03/2018: Patient reports she was started on this medication recently; prescribed by "doctor at [facility];" reports started taking medication on September 23, 2018  . [DISCONTINUED] amLODipine (NORVASC) 10 MG tablet Take 1 tablet (10 mg total) by mouth daily.   . [DISCONTINUED] atorvastatin (LIPITOR) 10 MG tablet Take 1 tablet (10 mg total) by mouth daily at 6 PM.   . [DISCONTINUED] pantoprazole (  PROTONIX) 40 MG tablet Take 1 tablet (40 mg total) by mouth 2 (two) times daily.    No facility-administered encounter medications on file as of 02/15/2019.     Surgical History: Past Surgical History:  Procedure Laterality Date  . GALLBLADDER SURGERY  2012    Medical History: Past Medical History:  Diagnosis Date  . Atherosclerotic heart disease 06/12/2015  . CHF (congestive heart failure) ([employer])   . Chronic congestive heart failure with left ventricular diastolic dysfunction ([place]) 04/12/2018  . CKD (chronic kidney disease) stage 2, GFR 60-89 ml/min 06/12/2015  . COPD (chronic obstructive pulmonary disease)  ([place])   . Hypercalcemia 06/12/2015  . Hyperlipidemia 06/12/2015  . Hypertension 06/12/2015  . Hypothyroidism 06/12/2015  . Insomnia 06/12/2015  . Interstitial pneumonia ([place]) 04/12/2018  . Pulmonary heart disease ([place]) 06/12/2015  . Shortness of breath 06/12/2015  . Solitary pulmonary nodule 06/12/2015    Family History: Family History  Problem Relation Age of Onset  . Cancer Mother   . Hypertension Father   . Diabetes Father   . Cancer Father     Social History   Socioeconomic History  . Marital status: Married    Spouse name: Daryl  . Number of children: 3  . Years of education: 70  . Highest education level: 12th grade  Occupational History  . Not on file  Social Needs  . Financial resource strain: Not hard at all  . Food insecurity    Worry: Never true    Inability: Never true  . Transportation needs    Medical: No    Non-medical: No  Tobacco Use  . Smoking status: Former Smoker    Quit date: 2013    Years since quitting: 7.4  . Smokeless tobacco: Never Used  Substance and Sexual Activity  . Alcohol use: No    Alcohol/week: 0.0 standard drinks  . Drug use: No  . Sexual activity: Yes    Birth control/protection: None  Lifestyle  . Physical activity    Days per week: 2 days    Minutes per session: 30 min  . Stress: Not at all  Relationships  . Social Herbalist on phone: Twice a week    Gets together: Once a week    Attends religious service: 1 to 4 times per year    Active member of club or organization: No    Attends meetings of clubs or organizations: Never    Relationship status: Married  . Intimate partner violence    Fear of current or ex partner: No    Emotionally abused: No    Physically abused: No    Forced sexual activity: No  Other Topics Concern  . Not on file  Social History Narrative  . Not on file      Review of Systems  Constitutional: Positive for fatigue. Negative for activity change, chills and unexpected  weight change.  HENT: Negative for congestion, postnasal drip, rhinorrhea, sneezing and sore throat.   Respiratory: Positive for cough, shortness of breath and wheezing. Negative for chest tightness.        Using oxygen via nasal cannula at all times.   Cardiovascular: Negative for chest pain and palpitations.  Gastrointestinal: Negative for abdominal pain, constipation, diarrhea, nausea and vomiting.  Endocrine: Negative for cold intolerance, heat intolerance, polydipsia and polyuria.  Musculoskeletal: Negative for arthralgias, back pain, joint swelling and neck pain.  Skin: Negative for rash.  Allergic/Immunologic: Positive for environmental allergies.  Neurological: Positive for weakness.  Negative for dizziness, tremors and numbness.  Hematological: Negative for adenopathy. Does not bruise/bleed easily.  Psychiatric/Behavioral: Negative for behavioral problems (Depression), sleep disturbance and suicidal ideas. The patient is not nervous/anxious.     Today's Vitals   02/15/19 1034  BP: (!) 146/64  Pulse: 72  Resp: 16  SpO2: 98%  Weight: 142 lb 9.6 oz (64.7 kg)  Height: 5\' 1"  (1.549 m)   Body mass index is 26.94 kg/m.  Physical Exam Vitals signs and nursing note reviewed.  Constitutional:      General: She is not in acute distress.    Appearance: Normal appearance. She is well-developed. She is not diaphoretic.  HENT:     Head: Normocephalic and atraumatic.     Nose: Nose normal.     Mouth/Throat:     Pharynx: No oropharyngeal exudate.  Eyes:     Conjunctiva/sclera: Conjunctivae normal.     Pupils: Pupils are equal, round, and reactive to light.  Neck:     Musculoskeletal: Normal range of motion and neck supple.     Thyroid: No thyromegaly.     Vascular: No JVD.     Trachea: No tracheal deviation.  Cardiovascular:     Rate and Rhythm: Normal rate and regular rhythm.     Heart sounds: Murmur present. No friction rub. No gallop.   Pulmonary:     Effort: Pulmonary  effort is normal. No respiratory distress.     Breath sounds: Decreased air movement present. No wheezing or rales.  Chest:     Chest wall: No tenderness.  Abdominal:     General: Bowel sounds are normal.     Palpations: Abdomen is soft.     Tenderness: There is no abdominal tenderness.  Musculoskeletal: Normal range of motion.  Lymphadenopathy:     Cervical: No cervical adenopathy.  Skin:    General: Skin is warm and dry.     Capillary Refill: Capillary refill takes 2 to 3 seconds.  Neurological:     Mental Status: She is alert and oriented to person, place, and time.     Cranial Nerves: No cranial nerve deficit.  Psychiatric:        Behavior: Behavior normal.        Thought Content: Thought content normal.        Judgment: Judgment normal.   Assessment/Plan: 1. Essential hypertension Blood pressure is generally stable. Continue BP medication as prescribed  - amLODipine (NORVASC) 10 MG tablet; Take 1 tablet (10 mg total) by mouth daily.  Dispense: 30 tablet; Refill: 5  2. Obstructive chronic bronchitis without exacerbation (HCC) Stable. Continue inhalers and respiratory medication as prescribed. Follow up with Dr. Devona Konig as scheduled.  3. Oxygen dependent Continue nasal cannula oxygen at all times.  5. Mixed hyperlipidemia Stable. Continue atorvastatin as prescribed  - atorvastatin (LIPITOR) 10 MG tablet; Take 1 tablet (10 mg total) by mouth daily at 6 PM.  Dispense: 30 tablet; Refill: 5  6. Peptic ulcer disease - pantoprazole (PROTONIX) 40 MG tablet; Take 1 tablet (40 mg total) by mouth 2 (two) times daily.  Dispense: 60 tablet; Refill: 5  General Counseling: Nikayla verbalizes understanding of the findings of todays visit and agrees with plan of treatment. I have discussed any further diagnostic evaluation that may be needed or ordered today. We also reviewed her medications today. she has been encouraged to call the office with any questions or concerns that should  arise related to todays visit.  Hypertension Counseling:   The  following hypertensive lifestyle modification were recommended and discussed:  1. Limiting alcohol intake to less than 1 oz/day of ethanol:(24 oz of beer or 8 oz of wine or 2 oz of 100-proof whiskey). 2. Take baby ASA 81 mg daily. 3. Importance of regular aerobic exercise and losing weight. 4. Reduce dietary saturated fat and cholesterol intake for overall cardiovascular health. 5. Maintaining adequate dietary potassium, calcium, and magnesium intake. 6. Regular monitoring of the blood pressure. 7. Reduce sodium intake to less than 100 mmol/day (less than 2.3 gm of sodium or less than 6 gm of sodium choride)   This patient was seen by [place] with Dr Lavera Guise as a part of collaborative care agreement  Meds ordered this encounter  Medications  . amLODipine (NORVASC) 10 MG tablet    Sig: Take 1 tablet (10 mg total) by mouth daily.    Dispense:  30 tablet    Refill:  5    Order Specific Question:   Supervising Provider    Answer:   Lavera Guise [[id number]]  . pantoprazole (PROTONIX) 40 MG tablet    Sig: Take 1 tablet (40 mg total) by mouth 2 (two) times daily.    Dispense:  60 tablet    Refill:  5    Order Specific Question:   Supervising Provider    Answer:   Lavera Guise [[id number]]  . atorvastatin (LIPITOR) 10 MG tablet    Sig: Take 1 tablet (10 mg total) by mouth daily at 6 PM.    Dispense:  30 tablet    Refill:  5    Order Specific Question:   Supervising Provider    Answer:   Lavera Guise [[id number]]    Time spent: 39 Minutes      Dr Lavera Guise Internal medicine

## 2019-02-16 ENCOUNTER — Other Ambulatory Visit: Payer: Self-pay | Admitting: Nurse Practitioner

## 2019-02-16 DIAGNOSIS — Z1211 Encounter for screening for malignant neoplasm of colon: Secondary | ICD-10-CM

## 2019-02-17 DIAGNOSIS — K279 Peptic ulcer, site unspecified, unspecified as acute or chronic, without hemorrhage or perforation: Secondary | ICD-10-CM | POA: Insufficient documentation

## 2019-03-09 ENCOUNTER — Other Ambulatory Visit: Payer: Self-pay

## 2019-03-09 MED ORDER — LEVOTHYROXINE SODIUM 50 MCG PO TABS: 50.0000 ug | ORAL_TABLET | Freq: Every day | ORAL | 0 refills | Status: DC

## 2019-03-29 ENCOUNTER — Ambulatory Visit (INDEPENDENT_AMBULATORY_CARE_PROVIDER_SITE_OTHER): Payer: Medicare Other | Admitting: Internal Medicine

## 2019-03-29 ENCOUNTER — Other Ambulatory Visit: Payer: Self-pay

## 2019-03-29 ENCOUNTER — Encounter: Payer: Self-pay | Admitting: Internal Medicine

## 2019-03-29 VITALS — BP 138/62 | HR 94 | Resp 16 | Ht 60.0 in | Wt 140.0 lb

## 2019-03-29 DIAGNOSIS — B449 Aspergillosis, unspecified: Secondary | ICD-10-CM | POA: Diagnosis not present

## 2019-03-29 DIAGNOSIS — J9611 Chronic respiratory failure with hypoxia: Secondary | ICD-10-CM | POA: Diagnosis not present

## 2019-03-29 DIAGNOSIS — R0602 Shortness of breath: Secondary | ICD-10-CM | POA: Diagnosis not present

## 2019-03-29 DIAGNOSIS — J449 Chronic obstructive pulmonary disease, unspecified: Secondary | ICD-10-CM | POA: Diagnosis not present

## 2019-03-29 DIAGNOSIS — J849 Interstitial pulmonary disease, unspecified: Secondary | ICD-10-CM

## 2019-03-29 NOTE — Progress Notes (Signed)
[facility] [place], [place] [address]  Pulmonary Sleep Medicine   Office Visit Note  Patient Name: Melissa Knox DOB: May 13, 1951 MRN [id number]  Date of Service: 03/29/2019  Complaints/HPI: Patient is here for follow-up.  She is actually due to go back to her clinic at the [facility] for the interstitial lung disease clinic.  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  She was started on voriconazole she states that she took it for a few months unfortunately she says that she was not able to continue because of the money cost.  I have spoke with my staff and run a try to see if we can get this subsidized somehow for her.  In the meantime she states that her breathing is about the same she continues to use the oxygen she is having some cough every now and then.  She is avoiding being outside has had no exposure to COVID.  Denies having any hemoptysis.  No recent hospitalizations other than the hospitalization that she had back in early part of the year.  ROS  General: (-) fever, (-) chills, (-) night sweats, (-) weakness Skin: (-) rashes, (-) itching,. Eyes: (-) visual changes, (-) redness, (-) itching. Nose and Sinuses: (-) nasal stuffiness or itchiness, (-) postnasal drip, (-) nosebleeds, (-) sinus trouble. Mouth and Throat: (-) sore throat, (-) hoarseness. Neck: (-) swollen glands, (-) enlarged thyroid, (-) neck pain. Respiratory: + cough, (-) bloody sputum, + shortness of breath, - wheezing. Cardiovascular: - ankle swelling, (-) chest pain. Lymphatic: (-) lymph node enlargement. Neurologic: (-) numbness, (-) tingling. Psychiatric: (-) anxiety, (-) depression   Current Medication: Outpatient Encounter Medications as of 03/29/2019  Medication Sig Note  . albuterol (PROVENTIL) (2.5 MG/3ML) 0.083% nebulizer solution Take 2.5 mg by nebulization every 6 (six) hours as needed for wheezing or shortness of breath.   Marland Kitchen amLODipine (NORVASC) 10 MG tablet  Take 1 tablet (10 mg total) by mouth daily.   Marland Kitchen atorvastatin (LIPITOR) 10 MG tablet Take 1 tablet (10 mg total) by mouth daily at 6 PM.   . budesonide-formoterol (SYMBICORT) 160-4.5 MCG/ACT inhaler Inhale 2 puffs into the lungs 2 (two) times daily.   . carvedilol (COREG) 6.25 MG tablet Take 1 tablet (6.25 mg total) by mouth 2 (two) times daily.   Marland Kitchen docusate sodium (COLACE) 100 MG capsule Take 1 capsule (100 mg total) by mouth 2 (two) times daily as needed for mild constipation. 09/20/2018: Reports has not needed recently  . furosemide (LASIX) 20 MG tablet Take 1 tablet (20 mg total) by mouth daily.   Marland Kitchen levothyroxine (SYNTHROID) 50 MCG tablet Take 1 tablet (50 mcg total) by mouth daily before breakfast.   . loratadine (CLARITIN) 10 MG tablet Take one tab po qd for allergies as needed   . losartan (COZAAR) 100 MG tablet Take 100 mg by mouth daily. 09/20/2018: Patient reports "kidney doctor restarted in November 2019"  . meclizine (ANTIVERT) 12.5 MG tablet Take 12.5 mg by mouth 3 (three) times daily as needed for dizziness.  09/20/2018: Patient reports has not needed recently   . mirtazapine (REMERON) 15 MG tablet TAKE 1/2 OF A TABLET (7.5 MG TOTAL) BY MOUTH AT BEDTIME.   Marland Kitchen OXYGEN Inhale 3 L into the lungs.   . pantoprazole (PROTONIX) 40 MG tablet Take 1 tablet (40 mg total) by mouth 2 (two) times daily.   . sucralfate (CARAFATE) 1 g tablet Take 1 tablet (1 g total) by mouth 4 (four) times daily -  with meals and at bedtime.   . voriconazole (VFEND) 200 MG tablet Take 200 mg by mouth 2 (two) times daily. 10/03/2018: Patient reports she was started on this medication recently; prescribed by "doctor at [facility];" reports started taking medication on September 23, 2018   No facility-administered encounter medications on file as of 03/29/2019.     Surgical History: Past Surgical History:  Procedure Laterality Date  . GALLBLADDER SURGERY  2012    Medical History: Past Medical History:  Diagnosis Date  .  Atherosclerotic heart disease 06/12/2015  . CHF (congestive heart failure) ([place])   . Chronic congestive heart failure with left ventricular diastolic dysfunction ([place]) 04/12/2018  . CKD (chronic kidney disease) stage 2, GFR 60-89 ml/min 06/12/2015  . COPD (chronic obstructive pulmonary disease) ([place])   . Hypercalcemia 06/12/2015  . Hyperlipidemia 06/12/2015  . Hypertension 06/12/2015  . Hypothyroidism 06/12/2015  . Insomnia 06/12/2015  . Interstitial pneumonia ([place]) 04/12/2018  . Pulmonary heart disease ([place]) 06/12/2015  . Shortness of breath 06/12/2015  . Solitary pulmonary nodule 06/12/2015    Family History: Family History  Problem Relation Age of Onset  . Cancer Mother   . Hypertension Father   . Diabetes Father   . Cancer Father     Social History: Social History   Socioeconomic History  . Marital status: Married    Spouse name: Daryl  . Number of children: 3  . Years of education: 23  . Highest education level: 12th grade  Occupational History  . Not on file  Social Needs  . Financial resource strain: Not hard at all  . Food insecurity    Worry: Never true    Inability: Never true  . Transportation needs    Medical: No    Non-medical: No  Tobacco Use  . Smoking status: Former Smoker    Quit date: 2013    Years since quitting: 7.5  . Smokeless tobacco: Never Used  Substance and Sexual Activity  . Alcohol use: No    Alcohol/week: 0.0 standard drinks  . Drug use: No  . Sexual activity: Yes    Birth control/protection: None  Lifestyle  . Physical activity    Days per week: 2 days    Minutes per session: 30 min  . Stress: Not at all  Relationships  . Social Herbalist on phone: Twice a week    Gets together: Once a week    Attends religious service: 1 to 4 times per year    Active member of club or organization: No    Attends meetings of clubs or organizations: Never    Relationship status: Married  . Intimate partner violence    Fear of  current or ex partner: No    Emotionally abused: No    Physically abused: No    Forced sexual activity: No  Other Topics Concern  . Not on file  Social History Narrative  . Not on file    Vital Signs: Blood pressure 138/62, pulse 94, resp. rate 16, height 5' (1.524 m), weight 140 lb (63.5 kg), SpO2 (!) 89 %.  Examination: General Appearance: The patient is well-developed, well-nourished, and in no distress. Skin: Gross inspection of skin unremarkable. Head: normocephalic, no gross deformities. Eyes: no gross deformities noted. ENT: ears appear grossly normal no exudates. Neck: Supple. No thyromegaly. No LAD. Respiratory: few scattered rhonchi noted . Cardiovascular: Normal S1 and S2 without murmur or rub. Extremities: No cyanosis. pulses are equal. Neurologic: Alert and oriented.  No involuntary movements.  LABS: No results found for this or any previous visit (from the past 2160 hour(s)).  Radiology: Dg Chest Port 1 View  Result Date: 09/06/2018 CLINICAL DATA:  Acute respiratory failure EXAM: PORTABLE CHEST 1 VIEW COMPARISON:  Yesterday FINDINGS: Endotracheal tube tip 2 cm above the carina. The orogastric tube is coiled in the stomach. Borderline cardiomegaly. Extensive atherosclerosis. Coarse opacities on the left more than right. Improved aeration, especially the right perihilar lung. No effusion or pneumothorax. IMPRESSION: 1. Stable hardware positioning. 2. Pulmonary fibrosis with mild improvement in aeration. Electronically Signed   By: Monte Fantasia M.D.   On: 09/06/2018 05:35   Dg Chest Port 1 View  Result Date: 09/05/2018 CLINICAL DATA:  Short of breath COPD.  Bronchoscopy 24 hours ago. EXAM: PORTABLE CHEST 1 VIEW COMPARISON:  06/03/2018 FINDINGS: Endotracheal tube in good position.  Gastric tube in the stomach. Negative for pneumothorax post bronchoscopy Severe chronic lung disease. Progression of bibasilar airspace disease. Small bilateral pleural effusions, with  progression on the left. IMPRESSION: No pneumothorax post bronchoscopy Endotracheal tube in good position Severe chronic airspace disease bilaterally. Progression of bibasilar airspace disease and left effusion. Electronically Signed   By: Franchot Gallo M.D.   On: 09/05/2018 07:25    No results found.  No results found.    Assessment and Plan: Patient Active Problem List   Diagnosis Date Noted  . Peptic ulcer disease 02/17/2019  . Malnutrition of moderate degree 09/07/2018  . Acute on chronic respiratory failure with hypoxia ([place]) 09/05/2018  . Screening for breast cancer 07/19/2018  . HCAP (healthcare-associated pneumonia) 06/03/2018  . Chronic congestive heart failure with left ventricular diastolic dysfunction ([place]) 04/12/2018  . Obstructive chronic bronchitis without exacerbation ([place]) 02/17/2018  . Major depression, chronic 02/17/2018  . 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 Pollen allergies 01/16/2018  . CHF (congestive heart failure) ([place]) 05/28/2017  . Syncope 02/06/2017  . Pancreatic mass 02/06/2017  . Iron deficiency anemia 05/01/2016  . Anemia in chronic renal disease 04/26/2016  . Insomnia 06/12/2015  . CKD (chronic kidney disease) stage 2, GFR 60-89 ml/min 06/12/2015  . Hypercalcemia 06/12/2015  . Hypothyroidism 06/12/2015  . Pulmonary heart disease ([place]) 06/12/2015  . Hyperlipidemia 06/12/2015  . Essential hypertension 06/12/2015  . Atherosclerotic heart disease 06/12/2015  . Solitary pulmonary nodule 06/12/2015    1. Chronic respiratory failure on oxygen therapy which will be continued she does not clinically benefit from it and she states that her shortness of breath is less prominent when she is using the oxygen. 2. 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 I will see if we are able to get her subsidized medication. She is to follow up in [place] also so hopefully between our clinic in their clinic we can get something arranged for  her. 3. ILD being monitored by the interstitial lung clinic 4. COPD on inhalers and also on oxygen therapy which will be continued she states that she does get significant benefit from it.  General Counseling: I have discussed the findings of the evaluation and examination with [place].  I have also discussed any further diagnostic evaluation thatmay be needed or ordered today. Latrenda verbalizes understanding of the findings of todays visit. We also reviewed her medications today and discussed drug interactions and side effects including but not limited excessive drowsiness and altered mental states. We also discussed that there is always a risk not just to her but also people around her. she has been  encouraged to call the office with any questions or concerns that should arise related to todays visit.    Time spent: 57mn  I have personally obtained a history, examined the patient, evaluated laboratory and imaging results, formulated the assessment and plan and placed orders.    SAllyne Gee MD [facility]Co[facility] LPPulmonary and Critical Care Sleep medicine

## 2019-03-29 NOTE — Patient Instructions (Signed)
Home Oxygen Use, Adult When a medical condition keeps you from getting enough oxygen, your health care provider may instruct you to take extra oxygen at home. Your health care provider will let you know:  When to take oxygen.  For how long to take oxygen.  How quickly oxygen should be delivered (flow rate), in liters per minute (LPM or L/M). Home oxygen can be given through:  A mask.  A nasal cannula. This is a device or tube that goes in the nostrils.  A transtracheal catheter. This is a small, flexible tube placed in the trachea.  A tracheostomy. This is a surgically made opening in the trachea. These devices are connected with tubing to an oxygen source, such as:  A tank. Tanks hold oxygen in gas form. They must be replaced when the oxygen is used up.  A liquid oxygen device. This holds oxygen in liquid form. It must be replaced when the oxygen is used up.  An oxygen concentrator machine. This filters oxygen in the room. It uses electricity, so you must have a backup cylinder of oxygen in case the power goes out. Supplies needed: To use oxygen, you will need:  A mask, nasal cannula, transtracheal catheter, or tracheostomy.  An oxygen tank, a liquid oxygen device, or an oxygen concentrator.  The tape that your health care provider recommends (optional). If you use a transtracheal catheter and your prescribed flow rate is 1 LPM or greater, you will also need a humidifier. Risks and complications  Fire. This can happen if the oxygen is exposed to a heat source, flame, or spark.  Injury to skin. This can happen if liquid oxygen touches your skin.  Organ damage. This can happen if you get too little oxygen. How to use oxygen Your health care provider or a representative from your medical device company will show you how to use your oxygen device. Follow her or his instructions. The instructions may look something like this: 1. Wash your hands. 2. If you use an oxygen  concentrator, make sure it is plugged in. 3. Place one end of the tube into the port on the tank, device, or machine. 4. Place the mask over your nose and mouth. Or, place the nasal cannula and secure it with tape if instructed. If you use a tracheostomy or transtracheal catheter, connect it to the oxygen source as directed. 5. Make sure the liter-flow setting on the machine is at the level prescribed by your health care provider. 6. Turn on the machine or adjust the knob on the tank or device to the correct liter-flow setting. 7. When you are done, turn off and unplug the machine, or turn the knob to OFF. How to clean and care for the oxygen supplies Nasal cannula  Clean it with a warm, wet cloth daily or as needed.  Wash it with a liquid soap once a week.  Rinse it thoroughly once or twice a week.  Replace it every 2-4 weeks.  If you have an infection, such as a cold or pneumonia, change the cannula when you get better. Mask  Replace it every 2-4 weeks.  If you have an infection, such as a cold or pneumonia, change the mask when you get better. Humidifier bottle  Wash the bottle between each refill: ? Wash it with soap and warm water. ? Rinse it thoroughly. ? Disinfect it and its top. ? Air-dry it.  Make sure it is dry before you refill it. Oxygen concentrator  Clean   the air filter at least twice a week according to directions from your home medical equipment and service company.  Wipe down the cabinet every day. To do this: ? Unplug the unit. ? Wipe down the cabinet with a damp cloth. ? Dry the cabinet. Other equipment  Change any extra tubing every 1-3 months.  Follow instructions from your health care provider about taking care of any other equipment. Safety tips Fire safety tips   Keep your oxygen and oxygen supplies at least 5 ft away from sources of heat, flames, and sparks at all times.  Do not allow smoking near your oxygen. Put up "no smoking" signs in  your home. Avoid smoking areas when in public.  Do not use materials that can burn (are flammable) while you use oxygen.  When you go to a restaurant with portable oxygen, ask to be seated in the nonsmoking section.  Keep a fire extinguisher close by. Let your fire department know that you have oxygen in your home.  Test your home smoke detectors regularly. Traveling  Secure your oxygen tank in the vehicle so that it does not move around. Follow instructions from your medical device company about how to safely secure your tank.  Make sure you have enough oxygen for the amount of time you will be away from home.  If you are planning air travel, contact the airline to find out if they allow the use of an approved portable oxygen concentrator. You may also need documents from your health care provider and medical device company before you travel. General safety tips  If you use an oxygen cylinder, make sure it is in a stand or secured to an object that will not move (fixed object).  If you use liquid oxygen, make sure its container is kept upright.  If you use an oxygen concentrator: ? Tell your electric company. Make sure you are given priority service in the event that your power goes out. ? Avoid using extension cords, if possible. Follow these instructions at home:  Use oxygen only as told by your health care provider.  Do not use alcohol or other drugs that make you relax (sedating drugs) unless instructed. They can slow down your breathing rate and make it hard to get in enough oxygen.  Know how and when to order a refill of oxygen.  Always keep a spare tank of oxygen. Plan ahead for holidays when you may not be able to get a prescription filled.  Use water-based lubricants on your lips or nostrils. Do not use oil-based products like petroleum jelly.  To prevent skin irritation on your cheeks or behind your ears, tuck some gauze under the tubing. Contact a health care  provider if:  You get headaches often.  You have shortness of breath.  You have a lasting cough.  You have anxiety.  You are sleepy all the time.  You develop an illness that affects your breathing.  You cannot exercise at your regular level.  You are restless.  You have difficult or irregular breathing, and it is getting worse.  You have a fever.  You have persistent redness under your nose. Get help right away if:  You are confused.  You have blue lips or fingernails.  You are struggling to breathe. Summary  Your health care provider or a representative from your medical device company will show you how to use your oxygen device. Follow her or his instructions.  If you use an oxygen concentrator, make   sure it is plugged in.  Make sure the liter-flow setting on the machine is at the level prescribed by your health care provider.  Keep your oxygen and oxygen supplies at least 5 ft away from sources of heat, flames, and sparks at all times. This information is not intended to replace advice given to you by your health care provider. Make sure you discuss any questions you have with your health care provider. Document Released: 11/06/2003 Document Revised: 02/02/2018 Document Reviewed: 03/09/2016 Elsevier Patient Education  2020 [employer].

## 2019-03-31 ENCOUNTER — Other Ambulatory Visit: Payer: Self-pay | Admitting: Internal Medicine

## 2019-03-31 DIAGNOSIS — J301 Allergic rhinitis due to pollen: Secondary | ICD-10-CM

## 2019-04-06 ENCOUNTER — Other Ambulatory Visit: Payer: Self-pay

## 2019-04-06 MED ORDER — SUCRALFATE 1 G PO TABS: 1.0000 g | ORAL_TABLET | Freq: Three times a day (TID) | ORAL | 1 refills | Status: DC

## 2019-05-08 ENCOUNTER — Other Ambulatory Visit: Payer: Self-pay

## 2019-05-08 MED ORDER — SUCRALFATE 1 G PO TABS: 1.0000 g | ORAL_TABLET | Freq: Three times a day (TID) | ORAL | 1 refills | Status: DC

## 2019-05-14 DIAGNOSIS — Z79899 Other long term (current) drug therapy: Secondary | ICD-10-CM | POA: Diagnosis not present

## 2019-05-14 DIAGNOSIS — Z23 Encounter for immunization: Secondary | ICD-10-CM | POA: Diagnosis not present

## 2019-05-14 DIAGNOSIS — J449 Chronic obstructive pulmonary disease, unspecified: Secondary | ICD-10-CM | POA: Diagnosis not present

## 2019-05-14 DIAGNOSIS — R0602 Shortness of breath: Secondary | ICD-10-CM | POA: Diagnosis not present

## 2019-05-14 DIAGNOSIS — J9611 Chronic respiratory failure with hypoxia: Secondary | ICD-10-CM | POA: Diagnosis not present

## 2019-05-14 DIAGNOSIS — J849 Interstitial pulmonary disease, unspecified: Secondary | ICD-10-CM | POA: Diagnosis not present

## 2019-05-14 DIAGNOSIS — B440 Invasive pulmonary aspergillosis: Secondary | ICD-10-CM | POA: Diagnosis not present

## 2019-06-05 ENCOUNTER — Other Ambulatory Visit: Payer: Self-pay

## 2019-06-05 MED ORDER — SUCRALFATE 1 G PO TABS: 1.0000 g | ORAL_TABLET | Freq: Three times a day (TID) | ORAL | 1 refills | Status: DC

## 2019-06-06 ENCOUNTER — Other Ambulatory Visit: Payer: Self-pay

## 2019-06-06 MED ORDER — LEVOTHYROXINE SODIUM 50 MCG PO TABS: 50.0000 ug | ORAL_TABLET | Freq: Every day | ORAL | 0 refills | Status: DC

## 2019-06-25 ENCOUNTER — Other Ambulatory Visit: Payer: Self-pay

## 2019-06-25 DIAGNOSIS — J301 Allergic rhinitis due to pollen: Secondary | ICD-10-CM

## 2019-06-25 MED ORDER — LORATADINE 10 MG PO TABS: ORAL_TABLET | ORAL | 0 refills | Status: DC

## 2019-07-06 ENCOUNTER — Other Ambulatory Visit: Payer: Self-pay

## 2019-07-06 MED ORDER — SUCRALFATE 1 G PO TABS: 1.0000 g | ORAL_TABLET | Freq: Three times a day (TID) | ORAL | 1 refills | Status: DC

## 2019-07-17 ENCOUNTER — Emergency Department (HOSPITAL_COMMUNITY): Payer: Medicare Other

## 2019-07-17 ENCOUNTER — Other Ambulatory Visit: Payer: Self-pay

## 2019-07-17 ENCOUNTER — Inpatient Hospital Stay (HOSPITAL_COMMUNITY)
Admission: EM | Admit: 2019-07-17 | Discharge: 2019-07-21 | DRG: 068 | Disposition: A | Discharge status: Home / Self Care | Payer: Medicare Other | Source: Ambulatory Visit | Attending: Internal Medicine | Admitting: Internal Medicine

## 2019-07-17 DIAGNOSIS — I5032 Chronic diastolic (congestive) heart failure: Secondary | ICD-10-CM | POA: Diagnosis present

## 2019-07-17 DIAGNOSIS — I10 Essential (primary) hypertension: Secondary | ICD-10-CM | POA: Diagnosis not present

## 2019-07-17 DIAGNOSIS — Z8249 Family history of ischemic heart disease and other diseases of the circulatory system: Secondary | ICD-10-CM | POA: Diagnosis not present

## 2019-07-17 DIAGNOSIS — E1122 Type 2 diabetes mellitus with diabetic chronic kidney disease: Secondary | ICD-10-CM | POA: Diagnosis present

## 2019-07-17 DIAGNOSIS — Z87891 Personal history of nicotine dependence: Secondary | ICD-10-CM

## 2019-07-17 DIAGNOSIS — H5462 Unqualified visual loss, left eye, normal vision right eye: Secondary | ICD-10-CM | POA: Diagnosis not present

## 2019-07-17 DIAGNOSIS — Z809 Family history of malignant neoplasm, unspecified: Secondary | ICD-10-CM | POA: Diagnosis not present

## 2019-07-17 DIAGNOSIS — I639 Cerebral infarction, unspecified: Secondary | ICD-10-CM | POA: Diagnosis present

## 2019-07-17 DIAGNOSIS — R911 Solitary pulmonary nodule: Secondary | ICD-10-CM | POA: Diagnosis present

## 2019-07-17 DIAGNOSIS — Z9981 Dependence on supplemental oxygen: Secondary | ICD-10-CM | POA: Diagnosis not present

## 2019-07-17 DIAGNOSIS — Z7951 Long term (current) use of inhaled steroids: Secondary | ICD-10-CM

## 2019-07-17 DIAGNOSIS — H43822 Vitreomacular adhesion, left eye: Secondary | ICD-10-CM | POA: Diagnosis not present

## 2019-07-17 DIAGNOSIS — I6523 Occlusion and stenosis of bilateral carotid arteries: Principal | ICD-10-CM | POA: Diagnosis present

## 2019-07-17 DIAGNOSIS — I771 Stricture of artery: Secondary | ICD-10-CM | POA: Diagnosis not present

## 2019-07-17 DIAGNOSIS — H35033 Hypertensive retinopathy, bilateral: Secondary | ICD-10-CM | POA: Diagnosis not present

## 2019-07-17 DIAGNOSIS — J9611 Chronic respiratory failure with hypoxia: Secondary | ICD-10-CM | POA: Diagnosis present

## 2019-07-17 DIAGNOSIS — H34231 Retinal artery branch occlusion, right eye: Secondary | ICD-10-CM | POA: Diagnosis present

## 2019-07-17 DIAGNOSIS — J849 Interstitial pulmonary disease, unspecified: Secondary | ICD-10-CM | POA: Diagnosis present

## 2019-07-17 DIAGNOSIS — Z20828 Contact with and (suspected) exposure to other viral communicable diseases: Secondary | ICD-10-CM | POA: Diagnosis present

## 2019-07-17 DIAGNOSIS — I658 Occlusion and stenosis of other precerebral arteries: Secondary | ICD-10-CM | POA: Diagnosis not present

## 2019-07-17 DIAGNOSIS — D509 Iron deficiency anemia, unspecified: Secondary | ICD-10-CM | POA: Diagnosis present

## 2019-07-17 DIAGNOSIS — K219 Gastro-esophageal reflux disease without esophagitis: Secondary | ICD-10-CM | POA: Diagnosis present

## 2019-07-17 DIAGNOSIS — E039 Hypothyroidism, unspecified: Secondary | ICD-10-CM | POA: Diagnosis present

## 2019-07-17 DIAGNOSIS — N1832 Chronic kidney disease, stage 3b: Secondary | ICD-10-CM | POA: Diagnosis present

## 2019-07-17 DIAGNOSIS — N183 Chronic kidney disease, stage 3 unspecified: Secondary | ICD-10-CM | POA: Diagnosis not present

## 2019-07-17 DIAGNOSIS — I2510 Atherosclerotic heart disease of native coronary artery without angina pectoris: Secondary | ICD-10-CM | POA: Diagnosis not present

## 2019-07-17 DIAGNOSIS — E876 Hypokalemia: Secondary | ICD-10-CM | POA: Diagnosis not present

## 2019-07-17 DIAGNOSIS — I130 Hypertensive heart and chronic kidney disease with heart failure and stage 1 through stage 4 chronic kidney disease, or unspecified chronic kidney disease: Secondary | ICD-10-CM | POA: Diagnosis present

## 2019-07-17 DIAGNOSIS — H5461 Unqualified visual loss, right eye, normal vision left eye: Secondary | ICD-10-CM | POA: Diagnosis present

## 2019-07-17 DIAGNOSIS — M2578 Osteophyte, vertebrae: Secondary | ICD-10-CM | POA: Diagnosis not present

## 2019-07-17 DIAGNOSIS — M4312 Spondylolisthesis, cervical region: Secondary | ICD-10-CM | POA: Diagnosis not present

## 2019-07-17 DIAGNOSIS — J439 Emphysema, unspecified: Secondary | ICD-10-CM | POA: Diagnosis not present

## 2019-07-17 DIAGNOSIS — E785 Hyperlipidemia, unspecified: Secondary | ICD-10-CM | POA: Diagnosis present

## 2019-07-17 DIAGNOSIS — I6389 Other cerebral infarction: Secondary | ICD-10-CM | POA: Diagnosis not present

## 2019-07-17 DIAGNOSIS — D631 Anemia in chronic kidney disease: Secondary | ICD-10-CM | POA: Diagnosis present

## 2019-07-17 DIAGNOSIS — I6521 Occlusion and stenosis of right carotid artery: Secondary | ICD-10-CM | POA: Diagnosis not present

## 2019-07-17 DIAGNOSIS — I70208 Unspecified atherosclerosis of native arteries of extremities, other extremity: Secondary | ICD-10-CM | POA: Diagnosis present

## 2019-07-17 DIAGNOSIS — I700 Atherosclerosis of aorta: Secondary | ICD-10-CM | POA: Diagnosis not present

## 2019-07-17 DIAGNOSIS — N189 Chronic kidney disease, unspecified: Secondary | ICD-10-CM

## 2019-07-17 DIAGNOSIS — Z79890 Hormone replacement therapy (postmenopausal): Secondary | ICD-10-CM | POA: Diagnosis not present

## 2019-07-17 DIAGNOSIS — Z833 Family history of diabetes mellitus: Secondary | ICD-10-CM | POA: Diagnosis not present

## 2019-07-17 DIAGNOSIS — H547 Unspecified visual loss: Secondary | ICD-10-CM | POA: Diagnosis not present

## 2019-07-17 DIAGNOSIS — Z03818 Encounter for observation for suspected exposure to other biological agents ruled out: Secondary | ICD-10-CM | POA: Diagnosis not present

## 2019-07-17 DIAGNOSIS — J449 Chronic obstructive pulmonary disease, unspecified: Secondary | ICD-10-CM | POA: Diagnosis present

## 2019-07-17 DIAGNOSIS — H353132 Nonexudative age-related macular degeneration, bilateral, intermediate dry stage: Secondary | ICD-10-CM | POA: Diagnosis not present

## 2019-07-17 DIAGNOSIS — I272 Pulmonary hypertension, unspecified: Secondary | ICD-10-CM | POA: Diagnosis present

## 2019-07-17 DIAGNOSIS — I6501 Occlusion and stenosis of right vertebral artery: Secondary | ICD-10-CM | POA: Diagnosis not present

## 2019-07-17 DIAGNOSIS — E041 Nontoxic single thyroid nodule: Secondary | ICD-10-CM | POA: Diagnosis not present

## 2019-07-17 DIAGNOSIS — M47812 Spondylosis without myelopathy or radiculopathy, cervical region: Secondary | ICD-10-CM | POA: Diagnosis not present

## 2019-07-17 LAB — CBC
HCT: 29.2 % — ABNORMAL LOW (ref 36.0–46.0)
Hemoglobin: 8.6 g/dL — ABNORMAL LOW (ref 12.0–15.0)
MCH: 23.7 pg — ABNORMAL LOW (ref 26.0–34.0)
MCHC: 29.5 g/dL — ABNORMAL LOW (ref 30.0–36.0)
MCV: 80.4 fL (ref 80.0–100.0)
Platelets: 457 10*3/uL — ABNORMAL HIGH (ref 150–400)
RBC: 3.63 MIL/uL — ABNORMAL LOW (ref 3.87–5.11)
RDW: 17.5 % — ABNORMAL HIGH (ref 11.5–15.5)
WBC: 6.8 10*3/uL (ref 4.0–10.5)
nRBC: 0 % (ref 0.0–0.2)

## 2019-07-17 LAB — COMPREHENSIVE METABOLIC PANEL
ALT: 10 U/L (ref 0–44)
AST: 15 U/L (ref 15–41)
Albumin: 3.3 g/dL — ABNORMAL LOW (ref 3.5–5.0)
Alkaline Phosphatase: 70 U/L (ref 38–126)
Anion gap: 15 (ref 5–15)
BUN: 13 mg/dL (ref 8–23)
CO2: 22 mmol/L (ref 22–32)
Calcium: 8.2 mg/dL — ABNORMAL LOW (ref 8.9–10.3)
Chloride: 101 mmol/L (ref 98–111)
Creatinine, Ser: 1.67 mg/dL — ABNORMAL HIGH (ref 0.44–1.00)
GFR calc Af Amer: 36 mL/min — ABNORMAL LOW (ref >60)
GFR calc non Af Amer: 31 mL/min — ABNORMAL LOW (ref >60)
Glucose, Bld: 93 mg/dL (ref 70–99)
Potassium: 2.7 mmol/L — CL (ref 3.5–5.1)
Sodium: 138 mmol/L (ref 135–145)
Total Bilirubin: 0.5 mg/dL (ref 0.3–1.2)
Total Protein: 7.7 g/dL (ref 6.5–8.1)

## 2019-07-17 LAB — DIFFERENTIAL
Abs Immature Granulocytes: 0.02 10*3/uL (ref 0.00–0.07)
Basophils Absolute: 0 10*3/uL (ref 0.0–0.1)
Basophils Relative: 1 %
Eosinophils Absolute: 0.3 10*3/uL (ref 0.0–0.5)
Eosinophils Relative: 4 %
Immature Granulocytes: 0 %
Lymphocytes Relative: 28 %
Lymphs Abs: 1.9 10*3/uL (ref 0.7–4.0)
Monocytes Absolute: 0.5 10*3/uL (ref 0.1–1.0)
Monocytes Relative: 7 %
Neutro Abs: 4.1 10*3/uL (ref 1.7–7.7)
Neutrophils Relative %: 60 %

## 2019-07-17 LAB — CBG MONITORING, ED: Glucose-Capillary: 84 mg/dL (ref 70–99)

## 2019-07-17 LAB — PROTIME-INR
INR: 1.1 (ref 0.8–1.2)
Prothrombin Time: 14.1 seconds (ref 11.4–15.2)

## 2019-07-17 LAB — MAGNESIUM: Magnesium: 1.3 mg/dL — ABNORMAL LOW (ref 1.7–2.4)

## 2019-07-17 LAB — APTT: aPTT: 27 seconds (ref 24–36)

## 2019-07-17 MED ORDER — ALBUTEROL SULFATE (2.5 MG/3ML) 0.083% IN NEBU: 2.5000 mg | INHALATION_SOLUTION | Freq: Four times a day (QID) | RESPIRATORY_TRACT | Status: DC | PRN

## 2019-07-17 MED ORDER — FLUTICASONE FUROATE-VILANTEROL 200-25 MCG/INH IN AEPB
1.0000 | INHALATION_SPRAY | Freq: Every day | RESPIRATORY_TRACT | Status: DC
  Administered 2019-07-19 – 2019-07-21 (×3): 1 via RESPIRATORY_TRACT
  Filled 2019-07-17: qty 28

## 2019-07-17 MED ORDER — MAGNESIUM SULFATE 2 GM/50ML IV SOLN
2.0000 g | Freq: Once | INTRAVENOUS | Status: AC
  Administered 2019-07-17: 2 g via INTRAVENOUS

## 2019-07-17 MED ORDER — SUCRALFATE 1 G PO TABS
1.0000 g | ORAL_TABLET | Freq: Three times a day (TID) | ORAL | Status: DC
  Administered 2019-07-18 – 2019-07-21 (×11): 1 g via ORAL
  Filled 2019-07-17 (×11): qty 1

## 2019-07-17 MED ORDER — ASPIRIN 325 MG PO TABS
325.0000 mg | ORAL_TABLET | Freq: Every day | ORAL | Status: DC
  Administered 2019-07-18 – 2019-07-21 (×4): 325 mg via ORAL
  Filled 2019-07-17 (×4): qty 1

## 2019-07-17 MED ORDER — GADOBUTROL 1 MMOL/ML IV SOLN
6.0000 mL | Freq: Once | INTRAVENOUS | Status: AC | PRN
  Administered 2019-07-17: 6 mL via INTRAVENOUS

## 2019-07-17 MED ORDER — LORATADINE 10 MG PO TABS: 10.0000 mg | ORAL_TABLET | Freq: Every day | ORAL | Status: DC | PRN

## 2019-07-17 MED ORDER — DOCUSATE SODIUM 100 MG PO CAPS: 100.0000 mg | ORAL_CAPSULE | Freq: Two times a day (BID) | ORAL | Status: DC | PRN

## 2019-07-17 MED ORDER — PANTOPRAZOLE SODIUM 40 MG PO TBEC
40.0000 mg | DELAYED_RELEASE_TABLET | Freq: Two times a day (BID) | ORAL | Status: DC
  Administered 2019-07-18 – 2019-07-21 (×6): 40 mg via ORAL
  Filled 2019-07-17 (×6): qty 1

## 2019-07-17 MED ORDER — POTASSIUM CHLORIDE 10 MEQ/100ML IV SOLN
10.0000 meq | INTRAVENOUS | Status: DC
  Administered 2019-07-17 – 2019-07-18 (×2): 10 meq via INTRAVENOUS
  Filled 2019-07-17: qty 100

## 2019-07-17 MED ORDER — POTASSIUM CHLORIDE CRYS ER 20 MEQ PO TBCR
40.0000 meq | EXTENDED_RELEASE_TABLET | Freq: Once | ORAL | Status: AC
  Administered 2019-07-17: 40 meq via ORAL
  Filled 2019-07-17: qty 2

## 2019-07-17 MED ORDER — LEVOTHYROXINE SODIUM 50 MCG PO TABS
50.0000 ug | ORAL_TABLET | Freq: Every day | ORAL | Status: DC
  Administered 2019-07-18 – 2019-07-21 (×4): 50 ug via ORAL
  Filled 2019-07-17 (×4): qty 1

## 2019-07-17 MED ORDER — SODIUM CHLORIDE 0.9% FLUSH
3.0000 mL | Freq: Once | INTRAVENOUS | Status: DC
Start: 2019-07-17 — End: 2019-07-21

## 2019-07-17 MED ORDER — MIRTAZAPINE 15 MG PO TABS
7.5000 mg | ORAL_TABLET | Freq: Every day | ORAL | Status: DC
  Administered 2019-07-18 – 2019-07-20 (×4): 7.5 mg via ORAL
  Filled 2019-07-17 (×6): qty 1

## 2019-07-17 MED ORDER — ATORVASTATIN CALCIUM 40 MG PO TABS
40.0000 mg | ORAL_TABLET | Freq: Every day | ORAL | Status: DC
  Administered 2019-07-18 – 2019-07-20 (×3): 40 mg via ORAL
  Filled 2019-07-17 (×3): qty 1

## 2019-07-17 MED ORDER — ENOXAPARIN SODIUM 40 MG/0.4ML ~~LOC~~ SOLN: 40.0000 mg | SUBCUTANEOUS | Status: DC

## 2019-07-17 NOTE — H&P (Signed)
History and Physical  Melissa Knox PYK:[id number] DOB: 26-Aug-1951 DOA: 07/17/2019  Referring physician: ER provider PCP: Ronnell Freshwater, NP  Outpatient Specialists:    Patient coming from: Ophthalmology office  Chief Complaint: Visual loss right eye  HPI: Patient is a 68 year old female with past medical history significant for hypertension, hyperlipidemia, hypercalcemia, COPD, CKD, diastolic CHF, pulmonary nodule and interstitial pneumonia.  Patient presents with partial visual loss involving the right eye.  According to the patient, the problem started about 2 weeks prior to presentation.  Patient suddenly developed inability to see through the medial aspect of the right.  Patient was seen by the ophthalmologist earlier today and was asked to come to the emergency room for acute CVA to be ruled out.  Patient has had MRI of the head, MRI of the orbits with and without contrast on both have been nonrevealing.  No headache, no neck pain, no chest pain, no shortness of breath, no URI symptoms, no GI symptoms and no urinary symptoms.  Patient will be admitted for further assessment and management.   ED Course: On presentation to the ER, temperature is 98.7, blood pressure 133/54, heart rate of 79 with respiratory rate of 19 and O2 sat of 94% on room air.  MRI of the brain is negative.  MRI of the socket without contrast came back negative.  Neurology team has seen patient and a full dose aspirin advised.  Admission for further CVA work-up was also advised by the neurology team as per the ER provider.  Pertinent labs: Chemistry reveals sodium of 138, potassium of 2.7, chloride 101, CO2 22, BUN of 39 creatinine of 1.67 with a blood sugar of 93.  Magnesium is 1.3.  EKG: Independently reviewed.   Imaging: independently reviewed.  MRI brain was negative for acute CVA.  MRI of the second with and without contrast was also negative.  Review of Systems:  Negative for fever, sore throat, rash, new  muscle aches, chest pain, SOB, dysuria, bleeding, n/v/abdominal pain.  Past Medical History:  Diagnosis Date  . Atherosclerotic heart disease 06/12/2015  . CHF (congestive heart failure) ([place])   . Chronic congestive heart failure with left ventricular diastolic dysfunction ([place]) 04/12/2018  . CKD (chronic kidney disease) stage 2, GFR 60-89 ml/min 06/12/2015  . COPD (chronic obstructive pulmonary disease) ([place])   . Hypercalcemia 06/12/2015  . Hyperlipidemia 06/12/2015  . Hypertension 06/12/2015  . Hypothyroidism 06/12/2015  . Insomnia 06/12/2015  . Interstitial pneumonia ([place]) 04/12/2018  . 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 Shortness of breath 06/12/2015  . Solitary pulmonary nodule 06/12/2015    Past Surgical History:  Procedure Laterality Date  . GALLBLADDER SURGERY  2012     reports that she quit smoking about 7 years ago. She has never used smokeless tobacco. She reports that she does not drink alcohol or use drugs.  No Known Allergies  Family History  Problem Relation Age of Onset  . Cancer Mother   . Hypertension Father   . Diabetes Father   . Cancer Father      Prior to Admission medications   Medication Sig Start Date End Date Taking? Authorizing Provider  albuterol (PROVENTIL) (2.5 MG/3ML) 0.083% nebulizer solution Take 2.5 mg by nebulization every 6 (six) hours as needed for wheezing or shortness of breath.   Yes [provider]  amLODipine (NORVASC) 10 MG tablet Take 1 tablet (10 mg total) by mouth daily. 02/15/19  Yes Ronnell Freshwater, NP  atorvastatin (LIPITOR) 10 MG tablet  Take 1 tablet (10 mg total) by mouth daily at 6 PM. 02/15/19  Yes Boscia, Greer Ee, NP  budesonide-formoterol (SYMBICORT) 160-4.5 MCG/ACT inhaler Inhale 2 puffs into the lungs 2 (two) times daily. 06/22/18  Yes Scarboro, Audie Clear, NP  carvedilol (COREG) 6.25 MG tablet Take 1 tablet (6.25 mg total) by mouth 2 (two) times daily. 07/17/18  Yes Boscia, Greer Ee, NP  docusate  sodium (COLACE) 100 MG capsule Take 1 capsule (100 mg total) by mouth 2 (two) times daily as needed for mild constipation. 04/14/18  Yes Gouru, Illene Silver, MD  furosemide (LASIX) 20 MG tablet Take 1 tablet (20 mg total) by mouth daily. 09/14/18  Yes Scarboro, Audie Clear, NP  levothyroxine (SYNTHROID) 50 MCG tablet Take 1 tablet (50 mcg total) by mouth daily before breakfast. 06/06/19  Yes Boscia, Heather E, NP  loratadine (CLARITIN) 10 MG tablet TAKE 1 TABLET BY MOUTH EVERY DAY FOR ALLERGIES AS NEEDED Patient taking differently: Take 10 mg by mouth daily as needed for allergies.  06/25/19  Yes Boscia, Greer Ee, NP  losartan (COZAAR) 100 MG tablet Take 100 mg by mouth daily.   Yes Lateef, Munsoor, MD  meclizine (ANTIVERT) 12.5 MG tablet Take 12.5 mg by mouth 3 (three) times daily as needed for dizziness.  06/02/16  Yes [provider]  mirtazapine (REMERON) 15 MG tablet TAKE 1/2 OF A TABLET (7.5 MG TOTAL) BY MOUTH AT BEDTIME. Patient taking differently: Take 7.5 mg by mouth at bedtime.  01/31/19  Yes Boscia, Greer Ee, NP  OXYGEN Inhale 3 L into the lungs.   Yes [provider]  pantoprazole (PROTONIX) 40 MG tablet Take 1 tablet (40 mg total) by mouth 2 (two) times daily. 02/15/19  Yes Boscia, Heather E, NP  sucralfate (CARAFATE) 1 g tablet Take 1 tablet (1 g total) by mouth 4 (four) times daily -  with meals and at bedtime. 07/06/19  Yes Ronnell Freshwater, NP    Physical Exam: Vitals:   07/17/19 1144 07/17/19 1145  BP: 135/67   Pulse: 91   Resp: 18   Temp: 98.7 F (37.1 C)   TempSrc: Oral   SpO2: (S) 91%   Weight:  62.6 kg  Height:  5' (1.524 m)     Constitutional:  . Appears calm and comfortable Eyes:  . No pallor. No jaundice.  Loss of vision medial aspect of the right eye  ENMT:  . external ears, nose appear normal Neck:  . Neck is supple. No JVD Respiratory:  . CTA bilaterally, no w/r/r.  . Respiratory effort normal. No retractions or accessory muscle use  Cardiovascular:  . S1S2 . No LE extremity edema   Abdomen:  . Abdomen is soft and non tender. Organs are difficult to assess. Neurologic:  . Awake and alert. . Moves all limbs. . Loss of vision medial aspect of the right eye   Wt Readings from Last 3 Encounters:  07/17/19 62.6 kg  03/29/19 63.5 kg  02/15/19 64.7 kg    I have personally reviewed following labs and imaging studies  Labs on Admission:  CBC: Recent Labs  Lab 07/17/19 1222  WBC 6.8  NEUTROABS 4.1  HGB 8.6*  HCT 29.2*  MCV 80.4  PLT 782*   Basic Metabolic Panel: Recent Labs  Lab 07/17/19 1222 07/17/19 1520  NA 138  --   K 2.7*  --   CL 101  --   CO2 22  --   GLUCOSE 93  --   BUN  13  --   CREATININE 1.67*  --   CALCIUM 8.2*  --   MG  --  1.3*   Liver Function Tests: Recent Labs  Lab 07/17/19 1222  AST 15  ALT 10  ALKPHOS 70  BILITOT 0.5  PROT 7.7  ALBUMIN 3.3*   No results for input(s): LIPASE, AMYLASE in the last 168 hours. No results for input(s): AMMONIA in the last 168 hours. Coagulation Profile: Recent Labs  Lab 07/17/19 1222  INR 1.1   Cardiac Enzymes: No results for input(s): CKTOTAL, CKMB, CKMBINDEX, TROPONINI in the last 168 hours. BNP (last 3 results) No results for input(s): PROBNP in the last 8760 hours. HbA1C: No results for input(s): HGBA1C in the last 72 hours. CBG: Recent Labs  Lab 07/17/19 1418  GLUCAP 84   Lipid Profile: No results for input(s): CHOL, HDL, LDLCALC, TRIG, CHOLHDL, LDLDIRECT in the last 72 hours. Thyroid Function Tests: No results for input(s): TSH, T4TOTAL, FREET4, T3FREE, THYROIDAB in the last 72 hours. Anemia Panel: No results for input(s): VITAMINB12, FOLATE, FERRITIN, TIBC, IRON, RETICCTPCT in the last 72 hours. Urine analysis:    Component Value Date/Time   COLORURINE COLORLESS (A) 06/03/2018 1200   APPEARANCEUR Clear 07/17/2018 1140   LABSPEC 1.005 06/03/2018 1200   PHURINE 6.0 06/03/2018 1200   GLUCOSEU Negative 07/17/2018  1140   HGBUR NEGATIVE 06/03/2018 1200   BILIRUBINUR Negative 07/17/2018 1140   KETONESUR NEGATIVE 06/03/2018 1200   PROTEINUR Negative 07/17/2018 1140   PROTEINUR NEGATIVE 06/03/2018 1200   NITRITE Negative 07/17/2018 1140   NITRITE NEGATIVE 06/03/2018 1200   LEUKOCYTESUR 2+ (A) 07/17/2018 1140   Sepsis Labs: @LABRCNTIP (procalcitonin:4,lacticidven:4) )No results found for this or any previous visit (from the past 240 hour(s)).    Radiological Exams on Admission: Ct Head Wo Contrast  Result Date: 07/17/2019 CLINICAL DATA:  Rule out ocular stroke. Unable to see at the right eye for 2 weeks EXAM: CT HEAD WITHOUT CONTRAST TECHNIQUE: Contiguous axial images were obtained from the base of the skull through the vertex without intravenous contrast. COMPARISON:  05/28/2017 FINDINGS: Brain: No evidence of acute infarction, hemorrhage, extra-axial collection, ventriculomegaly, or mass effect. Small right basal ganglia lacunar infarct. Generalized cerebral atrophy. Periventricular white matter low attenuation likely secondary to microangiopathy. Vascular: Cerebrovascular atherosclerotic calcifications are noted. Skull: Negative for fracture or focal lesion. Sinuses/Orbits: Visualized portions of the orbits are unremarkable. Visualized portions of the paranasal sinuses are unremarkable. Visualized portions of the mastoid air cells are unremarkable. Other: None. IMPRESSION: No acute intracranial pathology. Electronically Signed   By: Kathreen Devoid   On: 07/17/2019 12:57   Mr Brain Wo Contrast  Result Date: 07/17/2019 CLINICAL DATA:  68 year old female with right monocular nasal field vision loss x2 weeks. Awoke with symptoms, which remain unchanged-without improvement since on set. EXAM: MRI HEAD WITHOUT CONTRAST MRI ORBITS WITHOUT AND WITH CONTRAST TECHNIQUE: Multiplanar, multiecho pulse sequences of the brain and surrounding structures were obtained without and with intravenous contrast. Multiplanar,  multiecho pulse sequences of the orbits and surrounding structures were obtained including fat saturation techniques, before and after intravenous contrast administration. CONTRAST:  35mL GADAVIST GADOBUTROL 1 MMOL/ML IV SOLN COMPARISON:  Head CT earlier today. FINDINGS: MRI HEAD FINDINGS Brain: No restricted diffusion to suggest acute infarction. No midline shift, mass effect, evidence of mass lesion, ventriculomegaly, extra-axial collection or acute intracranial hemorrhage. Cervicomedullary junction and pituitary are within normal limits. Scattered small cerebral white matter T2 and FLAIR hyperintense foci in both hemispheres, mild to moderate for  age and in a nonspecific pattern. No cortical encephalomalacia or chronic cerebral blood products identified. 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 Otherwise negative deep gray matter nuclei, brainstem and cerebellum. Vascular: Major intracranial vascular flow voids are preserved. Skull and upper cervical spine: Advanced cervical spine degeneration including spondylolisthesis at C3-C4. Evidence of spinal stenosis from C3 inferiorly (series 7, image 10). Normal background bone marrow signal. Other: Visible internal auditory structures appear normal. Mastoids are clear. Scalp and face soft tissues appear negative. MRI ORBITS FINDINGS Orbits: Normal suprasellar cistern. The optic chiasm appears normal. Unremarkable cavernous sinus. Symmetric appearing pre chiasmatic optic nerves. No optic nerve T2 hyperintensity or enhancement. Both globes appear normal. No intraorbital mass or inflammation. No abnormal intraorbital enhancement. Gaze does appear to be mildly Disconjugate on series 4, image 9. Visualized sinuses: Mild ethmoid sinus mucosal thickening more pronounced on the right. No complicating features. Soft tissues: Superficial and deep soft tissue spaces of the face appear symmetric and normal. IMPRESSION: 1. Symmetric and  normal MRI appearance of the orbits. No explanation for right side monocular vision loss. Gaze does appear to be mildly disconjugate. 2.  No acute intracranial abnormality. Mild to moderate for age signal changes in the brain, including a small chronic lacune in the right caudate, compatible with small vessel disease. 3. 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 Electronically Signed   By: Genevie Ann M.D.   On: 07/17/2019 19:41   Mr Rosealee Albee GX Contrast  Result Date: 07/17/2019 CLINICAL DATA:  68 year old female with right monocular nasal field vision loss x2 weeks. Awoke with symptoms, which remain unchanged-without improvement since on set. EXAM: MRI HEAD WITHOUT CONTRAST MRI ORBITS WITHOUT AND WITH CONTRAST TECHNIQUE: Multiplanar, multiecho pulse sequences of the brain and surrounding structures were obtained without and with intravenous contrast. Multiplanar, multiecho pulse sequences of the orbits and surrounding structures were obtained including fat saturation techniques, before and after intravenous contrast administration. CONTRAST:  18mL GADAVIST GADOBUTROL 1 MMOL/ML IV SOLN COMPARISON:  Head CT earlier today. FINDINGS: MRI HEAD FINDINGS Brain: No restricted diffusion to suggest acute infarction. No midline shift, mass effect, evidence of mass lesion, ventriculomegaly, extra-axial collection or acute intracranial hemorrhage. Cervicomedullary junction and pituitary are within normal limits. Scattered small cerebral white matter T2 and FLAIR hyperintense foci in both hemispheres, mild to moderate for age and in a nonspecific pattern. No cortical encephalomalacia or chronic cerebral blood products identified. 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 Otherwise negative deep gray matter nuclei, brainstem and cerebellum. Vascular: Major intracranial vascular flow voids are preserved. Skull and upper cervical spine: Advanced cervical spine  degeneration including spondylolisthesis at C3-C4. Evidence of spinal stenosis from C3 inferiorly (series 7, image 10). Normal background bone marrow signal. Other: Visible internal auditory structures appear normal. Mastoids are clear. Scalp and face soft tissues appear negative. MRI ORBITS FINDINGS Orbits: Normal suprasellar cistern. The optic chiasm appears normal. Unremarkable cavernous sinus. Symmetric appearing pre chiasmatic optic nerves. No optic nerve T2 hyperintensity or enhancement. Both globes appear normal. No intraorbital mass or inflammation. No abnormal intraorbital enhancement. Gaze does appear to be mildly Disconjugate on series 4, image 9. Visualized sinuses: Mild ethmoid sinus mucosal thickening more pronounced on the right. No complicating features. Soft tissues: Superficial and deep soft tissue spaces of the face appear symmetric and normal. IMPRESSION: 1. Symmetric and normal MRI appearance of the orbits. No explanation  for right side monocular vision loss. Gaze does appear to be mildly disconjugate. 2.  No acute intracranial abnormality. Mild to moderate for age signal changes in the brain, including a small chronic lacune in the right caudate, compatible with small vessel disease. 3. 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 Electronically Signed   By: Genevie Ann M.D.   On: 07/17/2019 19:41    EKG: Independently reviewed.   Active Problems:   * No active hospital problems. *   Assessment/Plan Possible acute CVA with partial right eye visual loss: Admit patient for further assessment and management Full dose aspirin Results of the MRI brain and dissected noted Neurology input is appreciated Further management depend on hospital course  CHF: Stable. Continue current management.  Hypertension: Hold all antihypertensives Permissive hypertension Further management will depend on above.  COPD: Stable. Continue home medication  DVT prophylaxis:  Subcu Lovenox Code Status: Full code Family Communication: Husband Disposition Plan: Home eventually Consults called: Neurology Admission status: Inpatient  Time spent: 65 minutes.   Dana Allan, MD  [facility] Pager #: [phone] 7PM-7AM contact night coverage as above  07/17/2019, 9:38 PM

## 2019-07-17 NOTE — ED Notes (Signed)
Tele   Hospital bed ordered  

## 2019-07-17 NOTE — ED Provider Notes (Signed)
[facility] EMERGENCY DEPARTMENT Provider Note   CSN: [id number] Arrival date & time: 07/17/19  1055     History   Chief Complaint Chief Complaint  Patient presents with   Stroke Symptoms   sent from dr's office    HPI Rocquel Hesser is a 68 y.o. female possible history of CHF, CKD, COPD, HLD, HTN who presents for evaluation of vision changes that been ongoing for 2 weeks.  Patient reports that over the last 2 weeks, she has had blurry vision in the right eye.  She states it has progressed to almost complete vision loss in the lateral aspect of her right visual field.  She says that she can see in the medial aspect of her right eye but has trouble with peripheral vision.  She went to see an eye doctor today with the [place] [facility] specialist and sent her over to the emergency department to rule out ocular stroke.  Patient states that she has not had any trauma to the eye.  She wears glasses but has not had any contact use.  She denies any fevers, drainage from the eye.  She has not had any new cough, difficulty breathing.  She is on oxygen at home.  She is not currently on blood thinners.     The history is provided by the patient.    Past Medical History:  Diagnosis Date   Atherosclerotic heart disease 06/12/2015   CHF (congestive heart failure) (HCC)    Chronic congestive heart failure with left ventricular diastolic dysfunction ([place]) 04/12/2018   CKD (chronic kidney disease) stage 2, GFR 60-89 ml/min 06/12/2015   COPD (chronic obstructive pulmonary disease) (HCC)    Hypercalcemia 06/12/2015   Hyperlipidemia 06/12/2015   Hypertension 06/12/2015   Hypothyroidism 06/12/2015   Insomnia 06/12/2015   Interstitial pneumonia ([place]) 04/12/2018   Pulmonary heart disease ([place]) 06/12/2015   Shortness of breath 06/12/2015   Solitary pulmonary nodule 06/12/2015    Patient Active Problem List   Diagnosis Date Noted   Peptic ulcer disease 02/17/2019     Malnutrition of moderate degree 09/07/2018   Acute on chronic respiratory failure with hypoxia ([place]) 09/05/2018   Screening for breast cancer 07/19/2018   HCAP (healthcare-associated pneumonia) 06/03/2018   Chronic congestive heart failure with left ventricular diastolic dysfunction ([place]) 04/12/2018   Obstructive chronic bronchitis without exacerbation ([place]) 02/17/2018   Major depression, chronic 02/17/2018   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   Pollen allergies 01/16/2018   CHF (congestive heart failure) ([place]) 05/28/2017   Syncope 02/06/2017   Pancreatic mass 02/06/2017   Iron deficiency anemia 05/01/2016   Anemia in chronic renal disease 04/26/2016   Insomnia 06/12/2015   CKD (chronic kidney disease) stage 2, GFR 60-89 ml/min 06/12/2015   Hypercalcemia 06/12/2015   Hypothyroidism 06/12/2015   Pulmonary heart disease ([place]) 06/12/2015   Hyperlipidemia 06/12/2015   Essential hypertension 06/12/2015   Atherosclerotic heart disease 06/12/2015   Solitary pulmonary nodule 06/12/2015    Past Surgical History:  Procedure Laterality Date   GALLBLADDER SURGERY  2012     OB History   No obstetric history on file.      Home Medications    Prior to Admission medications   Medication Sig Start Date End Date Taking? Authorizing Provider  albuterol (PROVENTIL) (2.5 MG/3ML) 0.083% nebulizer solution Take 2.5 mg by nebulization every 6 (six) hours as needed for wheezing or shortness of breath.   Yes [provider]  amLODipine (NORVASC) 10 MG tablet Take 1 tablet (10  mg total) by mouth daily. 02/15/19  Yes Boscia, Greer Ee, NP  atorvastatin (LIPITOR) 10 MG tablet Take 1 tablet (10 mg total) by mouth daily at 6 PM. 02/15/19  Yes Boscia, Greer Ee, NP  budesonide-formoterol (SYMBICORT) 160-4.5 MCG/ACT inhaler Inhale 2 puffs into the lungs 2 (two) times daily. 06/22/18  Yes Scarboro, Audie Clear, NP  carvedilol (COREG) 6.25 MG tablet Take 1 tablet (6.25 mg total) by  mouth 2 (two) times daily. 07/17/18  Yes Boscia, Greer Ee, NP  docusate sodium (COLACE) 100 MG capsule Take 1 capsule (100 mg total) by mouth 2 (two) times daily as needed for mild constipation. 04/14/18  Yes Gouru, Illene Silver, MD  furosemide (LASIX) 20 MG tablet Take 1 tablet (20 mg total) by mouth daily. 09/14/18  Yes Scarboro, Audie Clear, NP  levothyroxine (SYNTHROID) 50 MCG tablet Take 1 tablet (50 mcg total) by mouth daily before breakfast. 06/06/19  Yes Boscia, Heather E, NP  loratadine (CLARITIN) 10 MG tablet TAKE 1 TABLET BY MOUTH EVERY DAY FOR ALLERGIES AS NEEDED Patient taking differently: Take 10 mg by mouth daily as needed for allergies.  06/25/19  Yes Boscia, Greer Ee, NP  losartan (COZAAR) 100 MG tablet Take 100 mg by mouth daily.   Yes Lateef, Munsoor, MD  meclizine (ANTIVERT) 12.5 MG tablet Take 12.5 mg by mouth 3 (three) times daily as needed for dizziness.  06/02/16  Yes [provider]  mirtazapine (REMERON) 15 MG tablet TAKE 1/2 OF A TABLET (7.5 MG TOTAL) BY MOUTH AT BEDTIME. Patient taking differently: Take 7.5 mg by mouth at bedtime.  01/31/19  Yes Boscia, Greer Ee, NP  OXYGEN Inhale 3 L into the lungs.   Yes [provider]  pantoprazole (PROTONIX) 40 MG tablet Take 1 tablet (40 mg total) by mouth 2 (two) times daily. 02/15/19  Yes Boscia, Heather E, NP  sucralfate (CARAFATE) 1 g tablet Take 1 tablet (1 g total) by mouth 4 (four) times daily -  with meals and at bedtime. 07/06/19  Yes Ronnell Freshwater, NP    Family History Family History  Problem Relation Age of Onset   Cancer Mother    Hypertension Father    Diabetes Father    Cancer Father     Social History Social History   Tobacco Use   Smoking status: Former Smoker    Quit date: 2013    Years since quitting: 7.8   Smokeless tobacco: Never Used  Substance Use Topics   Alcohol use: No    Alcohol/week: 0.0 standard drinks   Drug use: No     Allergies   Patient has no known  allergies.   Review of Systems Review of Systems  Constitutional: Negative for fever.  Eyes: Positive for visual disturbance. Negative for photophobia, pain and redness.  Respiratory: Negative for cough and shortness of breath.   Cardiovascular: Negative for chest pain.  Gastrointestinal: Negative for abdominal pain, nausea and vomiting.  Neurological: Negative for numbness and headaches.  All other systems reviewed and are negative.    Physical Exam Updated Vital Signs BP 135/67 (BP Location: Left Arm)    Pulse 91    Temp 98.7 F (37.1 C) (Oral)    Resp 18    Ht 5' (1.524 m)    Wt 62.6 kg    SpO2 (S) 91% Comment: uses O2 at night   BMI 26.95 kg/m   Physical Exam Vitals signs and nursing note reviewed.  Constitutional:      Appearance: Normal  appearance. She is well-developed.  HENT:     Head: Normocephalic and atraumatic.  Eyes:     General: Lids are normal.     Conjunctiva/sclera: Conjunctivae normal.     Pupils: Pupils are equal, round, and reactive to light.      Comments: PERRL. EOMs intact.  Lateral aspect of her right visual field with deficits.  She has decreased peripheral vision in has trouble telling me fingers I am holding up when right eye is isolated.  Normal visual acuity and peripheral vision in left eye.  Neck:     Musculoskeletal: Full passive range of motion without pain.  Cardiovascular:     Rate and Rhythm: Normal rate and regular rhythm.     Pulses: Normal pulses.     Heart sounds: Normal heart sounds. No murmur. No friction rub. No gallop.   Pulmonary:     Effort: Pulmonary effort is normal.     Breath sounds: Normal breath sounds.  Abdominal:     Palpations: Abdomen is soft. Abdomen is not rigid.     Tenderness: There is no abdominal tenderness. There is no guarding.  Musculoskeletal: Normal range of motion.  Skin:    General: Skin is warm and dry.     Capillary Refill: Capillary refill takes less than 2 seconds.  Neurological:     Mental  Status: She is alert and oriented to person, place, and time.     Comments: Cranial nerves III-XII intact Follows commands, Moves all extremities  5/5 strength to BUE and BLE  Sensation intact throughout all major nerve distributions No slurred speech. No facial droop.   Psychiatric:        Speech: Speech normal.      ED Treatments / Results  Labs (all labs ordered are listed, but only abnormal results are displayed) Labs Reviewed  CBC - Abnormal; Notable for the following components:      Result Value   RBC 3.63 (*)    Hemoglobin 8.6 (*)    HCT 29.2 (*)    MCH 23.7 (*)    MCHC 29.5 (*)    RDW 17.5 (*)    Platelets 457 (*)    All other components within normal limits  COMPREHENSIVE METABOLIC PANEL - Abnormal; Notable for the following components:   Potassium 2.7 (*)    Creatinine, Ser 1.67 (*)    Calcium 8.2 (*)    Albumin 3.3 (*)    GFR calc non Af Amer 31 (*)    GFR calc Af Amer 36 (*)    All other components within normal limits  MAGNESIUM - Abnormal; Notable for the following components:   Magnesium 1.3 (*)    All other components within normal limits  SARS CORONAVIRUS 2 (TAT 6-24 HRS)  PROTIME-INR  APTT  DIFFERENTIAL  I-STAT CHEM 8, ED  CBG MONITORING, ED    EKG EKG Interpretation  Date/Time:  Tuesday July 17 2019 12:11:52 EST Ventricular Rate:  86 PR Interval:  130 QRS Duration: 72 QT Interval:  380 QTC Calculation: 454 R Axis:   -20 Text Interpretation: Normal sinus rhythm Nonspecific ST abnormality Abnormal ECG when compared to prior, slower rate. No STEMI Confirmed by Antony Blackbird [phone]) on 07/17/2019 2:10:52 PM   Radiology Ct Head Wo Contrast  Result Date: 07/17/2019 CLINICAL DATA:  Rule out ocular stroke. Unable to see at the right eye for 2 weeks EXAM: CT HEAD WITHOUT CONTRAST TECHNIQUE: Contiguous axial images were obtained from the base of the skull through  the vertex without intravenous contrast. COMPARISON:  05/28/2017 FINDINGS:  Brain: No evidence of acute infarction, hemorrhage, extra-axial collection, ventriculomegaly, or mass effect. Small right basal ganglia lacunar infarct. Generalized cerebral atrophy. Periventricular white matter low attenuation likely secondary to microangiopathy. Vascular: Cerebrovascular atherosclerotic calcifications are noted. Skull: Negative for fracture or focal lesion. Sinuses/Orbits: Visualized portions of the orbits are unremarkable. Visualized portions of the paranasal sinuses are unremarkable. Visualized portions of the mastoid air cells are unremarkable. Other: None. IMPRESSION: No acute intracranial pathology. Electronically Signed   By: Kathreen Devoid   On: 07/17/2019 12:57   Mr Brain Wo Contrast  Result Date: 07/17/2019 CLINICAL DATA:  68 year old female with right monocular nasal field vision loss x2 weeks. Awoke with symptoms, which remain unchanged-without improvement since on set. EXAM: MRI HEAD WITHOUT CONTRAST MRI ORBITS WITHOUT AND WITH CONTRAST TECHNIQUE: Multiplanar, multiecho pulse sequences of the brain and surrounding structures were obtained without and with intravenous contrast. Multiplanar, multiecho pulse sequences of the orbits and surrounding structures were obtained including fat saturation techniques, before and after intravenous contrast administration. CONTRAST:  8mL GADAVIST GADOBUTROL 1 MMOL/ML IV SOLN COMPARISON:  Head CT earlier today. FINDINGS: MRI HEAD FINDINGS Brain: No restricted diffusion to suggest acute infarction. No midline shift, mass effect, evidence of mass lesion, ventriculomegaly, extra-axial collection or acute intracranial hemorrhage. Cervicomedullary junction and pituitary are within normal limits. Scattered small cerebral white matter T2 and FLAIR hyperintense foci in both hemispheres, mild to moderate for age and in a nonspecific pattern. No cortical encephalomalacia or chronic cerebral blood products identified. There is a small chronic lacunar infarct  in the right caudate on series 8, image 17. Otherwise negative deep gray matter nuclei, brainstem and cerebellum. Vascular: Major intracranial vascular flow voids are preserved. Skull and upper cervical spine: Advanced cervical spine degeneration including spondylolisthesis at C3-C4. Evidence of spinal stenosis from C3 inferiorly (series 7, image 10). Normal background bone marrow signal. Other: Visible internal auditory structures appear normal. Mastoids are clear. Scalp and face soft tissues appear negative. MRI ORBITS FINDINGS Orbits: Normal suprasellar cistern. The optic chiasm appears normal. Unremarkable cavernous sinus. Symmetric appearing pre chiasmatic optic nerves. No optic nerve T2 hyperintensity or enhancement. Both globes appear normal. No intraorbital mass or inflammation. No abnormal intraorbital enhancement. Gaze does appear to be mildly Disconjugate on series 4, image 9. Visualized sinuses: Mild ethmoid sinus mucosal thickening more pronounced on the right. No complicating features. Soft tissues: Superficial and deep soft tissue spaces of the face appear symmetric and normal. IMPRESSION: 1. Symmetric and normal MRI appearance of the orbits. No explanation for right side monocular vision loss. Gaze does appear to be mildly disconjugate. 2.  No acute intracranial abnormality. Mild to moderate for age signal changes in the brain, including a small chronic lacune in the right caudate, compatible with small vessel disease. 3. 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 Electronically Signed   By: Genevie Ann M.D.   On: 07/17/2019 19:41   Mr Rosealee Albee IN Contrast  Result Date: 07/17/2019 CLINICAL DATA:  68 year old female with right monocular nasal field vision loss x2 weeks. Awoke with symptoms, which remain unchanged-without improvement since on set. EXAM: MRI HEAD WITHOUT CONTRAST MRI ORBITS WITHOUT AND WITH CONTRAST TECHNIQUE: Multiplanar, multiecho pulse sequences of the  brain and surrounding structures were obtained without and with intravenous contrast. Multiplanar, multiecho pulse sequences of the orbits and surrounding structures were obtained including fat saturation techniques, before and after intravenous contrast administration. CONTRAST:  31mL GADAVIST GADOBUTROL  1 MMOL/ML IV SOLN COMPARISON:  Head CT earlier today. FINDINGS: MRI HEAD FINDINGS Brain: No restricted diffusion to suggest acute infarction. No midline shift, mass effect, evidence of mass lesion, ventriculomegaly, extra-axial collection or acute intracranial hemorrhage. Cervicomedullary junction and pituitary are within normal limits. Scattered small cerebral white matter T2 and FLAIR hyperintense foci in both hemispheres, mild to moderate for age and in a nonspecific pattern. No cortical encephalomalacia or chronic cerebral blood products identified. 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 Otherwise negative deep gray matter nuclei, brainstem and cerebellum. Vascular: Major intracranial vascular flow voids are preserved. Skull and upper cervical spine: Advanced cervical spine degeneration including spondylolisthesis at C3-C4. Evidence of spinal stenosis from C3 inferiorly (series 7, image 10). Normal background bone marrow signal. Other: Visible internal auditory structures appear normal. Mastoids are clear. Scalp and face soft tissues appear negative. MRI ORBITS FINDINGS Orbits: Normal suprasellar cistern. The optic chiasm appears normal. Unremarkable cavernous sinus. Symmetric appearing pre chiasmatic optic nerves. No optic nerve T2 hyperintensity or enhancement. Both globes appear normal. No intraorbital mass or inflammation. No abnormal intraorbital enhancement. Gaze does appear to be mildly Disconjugate on series 4, image 9. Visualized sinuses: Mild ethmoid sinus mucosal thickening more pronounced on the right. No complicating features. Soft tissues: Superficial  and deep soft tissue spaces of the face appear symmetric and normal. IMPRESSION: 1. Symmetric and normal MRI appearance of the orbits. No explanation for right side monocular vision loss. Gaze does appear to be mildly disconjugate. 2.  No acute intracranial abnormality. Mild to moderate for age signal changes in the brain, including a small chronic lacune in the right caudate, compatible with small vessel disease. 3. 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 Electronically Signed   By: Genevie Ann M.D.   On: 07/17/2019 19:41    Procedures Procedures (including critical care time)  Medications Ordered in ED Medications  sodium chloride flush (NS) 0.9 % injection 3 mL (0 mLs Intravenous Hold 07/17/19 1556)  potassium chloride 10 mEq in 100 mL IVPB (0 mEq Intravenous Stopped 07/17/19 1655)  magnesium sulfate IVPB 2 g 50 mL (has no administration in time range)  potassium chloride SA (KLOR-CON) CR tablet 40 mEq (40 mEq Oral Given 07/17/19 1556)  gadobutrol (GADAVIST) 1 MMOL/ML injection 6 mL (6 mLs Intravenous Contrast Given 07/17/19 1908)     Initial Impression / Assessment and Plan / ED Course  I have reviewed the triage vital signs and the nursing notes.  Pertinent labs & imaging results that were available during my care of the patient were reviewed by me and considered in my medical decision making (see chart for details).        68 year old female who presents for evaluation of blurry vision x2 weeks.  Sent over by eye doctor for evaluation of ocular stroke.  She states that she has had blurry vision that progressed to visual loss in the lateral aspect of her right visual field.  No weakness.  No trauma to the eye.  On initial ED arrival, she is afebrile, nontoxic-appearing.  O2 sats are 91%.  She does require oxygen at home.  Plan for labs, CT head and will consult neuro.  CMP shows potassium of 2.7.  Creatinine is 1.67.  CBC shows hemoglobin of 8.6.  CT head  shows no acute intracranial pathology.  Previous provider discussed with neuro hospitalist who recommended MRI brain and MRI orbits without contrast.  MRI brain  shows normal appearance of the orbit.  No acute intracranial abnormality.  I discussed with Dr. Malen Gauze (neuro).  Previous provider Dr. Cheral Marker had recommended admission with observation over 24 hours.  Discussed with patient.  She is agreeable.  We will plan for admission.  Discussed with Dr. Marthenia Rolling (hospitalist).  Will plan to admit.  He recommends that we give magnesium for her hypokalemia.  Portions of this note were generated with [employer]. Dictation errors may occur despite best attempts at proofreading.   Final Clinical Impressions(s) / ED Diagnoses   Final diagnoses:  Vision loss of right eye  Hypokalemia    ED Discharge Orders    None       Desma Mcgregor 07/17/19 2129    Lucrezia Starch, MD 07/17/19 2326

## 2019-07-17 NOTE — ED Triage Notes (Signed)
Sent from eye doctor's office -- r/o ocular stroke-- states has not been able to see out of right eye for 2 weeks

## 2019-07-17 NOTE — ED Notes (Signed)
Patient transported to MRI 

## 2019-07-17 NOTE — ED Provider Notes (Signed)
MSE was initiated and I personally evaluated the patient and placed orders (if any) at  2:41 PM on July 17, 2019.  Patient sent in from ophthalmologist office due to blurry vision along with changes in her vision on the right eye for the past week.  Was evaluated by ophthalmologist and sent in for likely an ocular stroke.  She reports she has not had any symptoms aside from the changes in her vision, she is unsure why she is here.  She reports it has been going on for 2 weeks however she had a difficulty seeing her ophthalmologist.  Does have a prior history of interstitial lung disease, currently on home oxygen 3L.   Physical Exam Vitals signs and nursing note reviewed.  Constitutional:      Appearance: Normal appearance.  HENT:     Head: Normocephalic and atraumatic.     Nose: Nose normal.     Mouth/Throat:     Mouth: Mucous membranes are moist.  Neck:     Musculoskeletal: Normal range of motion and neck supple.  Cardiovascular:     Rate and Rhythm: Normal rate.  Pulmonary:     Effort: Pulmonary effort is normal.  Abdominal:     General: Abdomen is flat.  Skin:    General: Skin is warm and dry.  Neurological:     Mental Status: She is alert and oriented to person, place, and time.   BP (!) 122/51   Pulse 72   Temp 98.3 F (36.8 C)   Resp 20   Ht 5' (1.524 m)   Wt 62.6 kg   SpO2 95%   BMI 26.95 kg/m   Patient's laboratory results remarkable for hypokalemia, she will be replaced with IV potassium x2, she will also receive oral potassium.  We will also consult neuro in order to obtain proper imaging for ocular infarct. A Visual acuity has also been ordered.    The patient appears stable so that the remainder of the MSE may be completed by another provider.   Janeece Fitting, PA-C 07/27/19 2316    Tegeler, Gwenyth Allegra, MD 07/30/19 [phone]

## 2019-07-17 NOTE — Consult Note (Signed)
Referring Physician: Dr. Roslynn Amble    Chief Complaint: Right monocular partial vision loss  HPI: Melissa Knox is an 68 y.o. female presenting with right monocular nasal field visual loss of acute onset 2 weeks ago. She states that she woke up with the symptoms. She could not see objects within the nasal half of the visual field of her right eye and also had impaired central vision being unable to read letters when looking directly at text. She could see normally within the temporal visual fields of her right eye. Her left eye was unaffected. No pain associated with the vision loss. The vision loss has not worsened or improved since it was first noticed 2 weeks ago.   She takes a baby ASA intermittently as an outpatient.   She denies left eye symptoms, facial weakness, confusion, language deficit, limb weakness, limb numbness, CP, headache, fever, SOB, cough, abdominal pain.   Past Medical History:  Diagnosis Date  . Atherosclerotic heart disease 06/12/2015  . CHF (congestive heart failure) ([place])   . Chronic congestive heart failure with left ventricular diastolic dysfunction ([place]) 04/12/2018  . CKD (chronic kidney disease) stage 2, GFR 60-89 ml/min 06/12/2015  . COPD (chronic obstructive pulmonary disease) ([place])   . Hypercalcemia 06/12/2015  . Hyperlipidemia 06/12/2015  . Hypertension 06/12/2015  . Hypothyroidism 06/12/2015  . Insomnia 06/12/2015  . Interstitial pneumonia ([place]) 04/12/2018  . 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 Shortness of breath 06/12/2015  . Solitary pulmonary nodule 06/12/2015    Past Surgical History:  Procedure Laterality Date  . GALLBLADDER SURGERY  2012    Family History  Problem Relation Age of Onset  . Cancer Mother   . Hypertension Father   . Diabetes Father   . Cancer Father    Social History:  reports that she quit smoking about 7 years ago. She has never used smokeless tobacco. She reports that she does not drink alcohol or use  drugs.  Allergies: No Known Allergies  Medications:  No current facility-administered medications on file prior to encounter.    Current Outpatient Medications on File Prior to Encounter  Medication Sig Dispense Refill  . albuterol (PROVENTIL) (2.5 MG/3ML) 0.083% nebulizer solution Take 2.5 mg by nebulization every 6 (six) hours as needed for wheezing or shortness of breath.    Marland Kitchen amLODipine (NORVASC) 10 MG tablet Take 1 tablet (10 mg total) by mouth daily. 30 tablet 5  . atorvastatin (LIPITOR) 10 MG tablet Take 1 tablet (10 mg total) by mouth daily at 6 PM. 30 tablet 5  . budesonide-formoterol (SYMBICORT) 160-4.5 MCG/ACT inhaler Inhale 2 puffs into the lungs 2 (two) times daily. 1 Inhaler 2  . carvedilol (COREG) 6.25 MG tablet Take 1 tablet (6.25 mg total) by mouth 2 (two) times daily. 60 tablet 5  . docusate sodium (COLACE) 100 MG capsule Take 1 capsule (100 mg total) by mouth 2 (two) times daily as needed for mild constipation. 10 capsule 0  . furosemide (LASIX) 20 MG tablet Take 1 tablet (20 mg total) by mouth daily. 30 tablet 6  . levothyroxine (SYNTHROID) 50 MCG tablet Take 1 tablet (50 mcg total) by mouth daily before breakfast. 90 tablet 0  . loratadine (CLARITIN) 10 MG tablet TAKE 1 TABLET BY MOUTH EVERY DAY FOR ALLERGIES AS NEEDED 90 tablet 0  . losartan (COZAAR) 100 MG tablet Take 100 mg by mouth daily.    . meclizine (ANTIVERT) 12.5 MG tablet Take 12.5 mg by mouth 3 (three) times daily as needed  for dizziness.     . mirtazapine (REMERON) 15 MG tablet TAKE 1/2 OF A TABLET (7.5 MG TOTAL) BY MOUTH AT BEDTIME. 45 tablet 1  . OXYGEN Inhale 3 L into the lungs.    . pantoprazole (PROTONIX) 40 MG tablet Take 1 tablet (40 mg total) by mouth 2 (two) times daily. 60 tablet 5  . sucralfate (CARAFATE) 1 g tablet Take 1 tablet (1 g total) by mouth 4 (four) times daily -  with meals and at bedtime. 120 tablet 1  . voriconazole (VFEND) 200 MG tablet Take 200 mg by mouth 2 (two) times daily.       ROS: As per HPI. Comprehensive ROS otherwise negative.   Physical Examination: Blood pressure 135/67, pulse 91, temperature 98.7 F (37.1 C), temperature source Oral, resp. rate 18, height 5' (1.524 m), weight 62.6 kg, SpO2 (S) 91 %.  HEENT: [place]/AT Lungs: Respirations unlabored Ext: No edema  Neurologic Examination: Mental Status: Alert, oriented, thought content appropriate.  Speech fluent without evidence of aphasia.  Able to follow all commands without difficulty. Cranial Nerves: II:  OS: Visual fields left eye are intact.  OD: Absent vision superior nasal quadrant and decreased vision inferior nasal quadrant. Also with impaired central vision. Temporal fields intact.  Left funduscopic exam normal.  Right funduscopic exam with 2 Hollenhorst plaques seen at the optic disc. Some of the retinal vessels OD appear smaller in caliber than on the left Right pupil 4 mm and reactive. Left pupil 3 mm and reactive (post-pupillary dilation at outpatient Ophthalmology appointment).   III,IV, VI: No ptosis. EOMI. No nystagmus.  V,VII: Smile symmetric, facial temp sensation normal bilaterally VIII: hearing intact to conversation IX,X: Palate rises symmetrically XI: Symmetric XII: midline tongue extension  Motor: Right : Upper extremity   5/5    Left:     Upper extremity   5/5  Lower extremity   5/5     Lower extremity   5/5 No pronator drift Sensory: Temp and light touch intact bilaterally Deep Tendon Reflexes:  2+ bilateral upper extremities and patellae.  Cerebellar: No ataxia with FNF bilaterally  Gait: Deferred  Results for orders placed or performed during the hospital encounter of 07/17/19 (from the past 48 hour(s))  Protime-INR     Status: None   Collection Time: 07/17/19 12:22 PM  Result Value Ref Range   Prothrombin Time 14.1 11.4 - 15.2 seconds   INR 1.1 0.8 - 1.2    Comment: (NOTE) INR goal varies based on device and disease states. Performed at [facility],  [place] [address], [place], [place] [address]   APTT     Status: None   Collection Time: 07/17/19 12:22 PM  Result Value Ref Range   aPTT 27 24 - 36 seconds    Comment: Performed at [facility], [place] [address] [place], [place] [address]  CBC     Status: Abnormal   Collection Time: 07/17/19 12:22 PM  Result Value Ref Range   WBC 6.8 4.0 - 10.5 K/uL   RBC 3.63 (L) 3.87 - 5.11 MIL/uL   Hemoglobin 8.6 (L) 12.0 - 15.0 g/dL   HCT 29.2 (L) 36.0 - 46.0 %   MCV 80.4 80.0 - 100.0 fL   MCH 23.7 (L) 26.0 - 34.0 pg   MCHC 29.5 (L) 30.0 - 36.0 g/dL   RDW 17.5 (H) 11.5 - 15.5 %   Platelets 457 (H) 150 - 400 K/uL   nRBC 0.0 0.0 - 0.2 %  Comment: Performed at [facility], [place] [address] [place], [place] [address]  Differential     Status: None   Collection Time: 07/17/19 12:22 PM  Result Value Ref Range   Neutrophils Relative % 60 %   Neutro Abs 4.1 1.7 - 7.7 K/uL   Lymphocytes Relative 28 %   Lymphs Abs 1.9 0.7 - 4.0 K/uL   Monocytes Relative 7 %   Monocytes Absolute 0.5 0.1 - 1.0 K/uL   Eosinophils Relative 4 %   Eosinophils Absolute 0.3 0.0 - 0.5 K/uL   Basophils Relative 1 %   Basophils Absolute 0.0 0.0 - 0.1 K/uL   Immature Granulocytes 0 %   Abs Immature Granulocytes 0.02 0.00 - 0.07 K/uL    Comment: Performed at [facility] [address] [place], [place] [address]  Comprehensive metabolic panel     Status: Abnormal   Collection Time: 07/17/19 12:22 PM  Result Value Ref Range   Sodium 138 135 - 145 mmol/L   Potassium 2.7 (LL) 3.5 - 5.1 mmol/L    Comment: CRITICAL RESULT CALLED TO, READ BACK BY AND VERIFIED WITH: KAREN COBB,RN AT 1324 07/17/2019 BY ZBEECH.    Chloride 101 98 - 111 mmol/L   CO2 22 22 - 32 mmol/L   Glucose, Bld 93 70 - 99 mg/dL   BUN 13 8 - 23 mg/dL   Creatinine, Ser 1.67 (H) 0.44 - 1.00 mg/dL   Calcium 8.2 (L) 8.9 - 10.3 mg/dL   Total Protein 7.7 6.5 - 8.1 g/dL   Albumin 3.3 (L) 3.5 - 5.0 g/dL   AST 15 15 - 41 U/L   ALT 10 0 - 44 U/L    Alkaline Phosphatase 70 38 - 126 U/L   Total Bilirubin 0.5 0.3 - 1.2 mg/dL   GFR calc non Af Amer 31 (L) >60 mL/min   GFR calc Af Amer 36 (L) >60 mL/min   Anion gap 15 5 - 15    Comment: Performed at [facility], [address] [place],  [address]  CBG monitoring, ED     Status: None   Collection Time: 07/17/19  2:18 PM  Result Value Ref Range   Glucose-Capillary 84 70 - 99 mg/dL   Ct Head Wo Contrast  Result Date: 07/17/2019 CLINICAL DATA:  Rule out ocular stroke. Unable to see at the right eye for 2 weeks EXAM: CT HEAD WITHOUT CONTRAST TECHNIQUE: Contiguous axial images were obtained from the base of the skull through the vertex without intravenous contrast. COMPARISON:  05/28/2017 FINDINGS: Brain: No evidence of acute infarction, hemorrhage, extra-axial collection, ventriculomegaly, or mass effect. Small right basal ganglia lacunar infarct. Generalized cerebral atrophy. Periventricular white matter low attenuation likely secondary to microangiopathy. Vascular: Cerebrovascular atherosclerotic calcifications are noted. Skull: Negative for fracture or focal lesion. Sinuses/Orbits: Visualized portions of the orbits are unremarkable. Visualized portions of the paranasal sinuses are unremarkable. Visualized portions of the mastoid air cells are unremarkable. Other: None. IMPRESSION: No acute intracranial pathology. Electronically Signed   By: Kathreen Devoid   On: 07/17/2019 12:57    Assessment: 68 y.o. female presenting with right monocular nasal field visual loss of acute onset 2 weeks ago.  1. 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 2 Hollenhorst plaques are seen within the right optic disc. 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  2. Stroke Risk Factors - CHF, HLD and HTN   Recommendations: 1. HgbA1c, fasting  lipid panel 2. MRI, MRA of the brain without contrast 3. PT consult, OT consult, Speech consult 4. Echocardiogram 5.  Carotid dopplers 6. Counseled patient to no longer take ASA intermittently. It should be taken every day without missing doses. Also recommended switching from 81 mg to 325 mg.  7. Risk factor modification 8. Telemetry monitoring 9. Frequent neuro checks 10. BP management.  11. Increase Lipitor to 40 mg po qd   @Electronically  signed: Dr. Kerney Elbe 07/17/2019, 3:42 PM

## 2019-07-18 ENCOUNTER — Inpatient Hospital Stay (HOSPITAL_COMMUNITY): Payer: Medicare Other

## 2019-07-18 DIAGNOSIS — E876 Hypokalemia: Secondary | ICD-10-CM

## 2019-07-18 DIAGNOSIS — I6389 Other cerebral infarction: Secondary | ICD-10-CM

## 2019-07-18 DIAGNOSIS — I6523 Occlusion and stenosis of bilateral carotid arteries: Secondary | ICD-10-CM

## 2019-07-18 DIAGNOSIS — H5461 Unqualified visual loss, right eye, normal vision left eye: Secondary | ICD-10-CM

## 2019-07-18 LAB — CBC
HCT: 28.9 % — ABNORMAL LOW (ref 36.0–46.0)
Hemoglobin: 8.6 g/dL — ABNORMAL LOW (ref 12.0–15.0)
MCH: 24.2 pg — ABNORMAL LOW (ref 26.0–34.0)
MCHC: 29.8 g/dL — ABNORMAL LOW (ref 30.0–36.0)
MCV: 81.4 fL (ref 80.0–100.0)
Platelets: 459 10*3/uL — ABNORMAL HIGH (ref 150–400)
RBC: 3.55 MIL/uL — ABNORMAL LOW (ref 3.87–5.11)
RDW: 17.8 % — ABNORMAL HIGH (ref 11.5–15.5)
WBC: 7 10*3/uL (ref 4.0–10.5)
nRBC: 0 % (ref 0.0–0.2)

## 2019-07-18 LAB — SARS CORONAVIRUS 2 (TAT 6-24 HRS): SARS Coronavirus 2: NEGATIVE

## 2019-07-18 LAB — LIPID PANEL
Cholesterol: 148 mg/dL (ref 0–200)
HDL: 52 mg/dL (ref >40)
LDL Cholesterol: 73 mg/dL (ref 0–99)
Total CHOL/HDL Ratio: 2.8 RATIO
Triglycerides: 113 mg/dL (ref <150)
VLDL: 23 mg/dL (ref 0–40)

## 2019-07-18 LAB — BASIC METABOLIC PANEL
Anion gap: 13 (ref 5–15)
BUN: 13 mg/dL (ref 8–23)
CO2: 25 mmol/L (ref 22–32)
Calcium: 8.4 mg/dL — ABNORMAL LOW (ref 8.9–10.3)
Chloride: 105 mmol/L (ref 98–111)
Creatinine, Ser: 1.43 mg/dL — ABNORMAL HIGH (ref 0.44–1.00)
GFR calc Af Amer: 44 mL/min — ABNORMAL LOW (ref >60)
GFR calc non Af Amer: 38 mL/min — ABNORMAL LOW (ref >60)
Glucose, Bld: 95 mg/dL (ref 70–99)
Potassium: 2.9 mmol/L — ABNORMAL LOW (ref 3.5–5.1)
Sodium: 143 mmol/L (ref 135–145)

## 2019-07-18 LAB — URINALYSIS, COMPLETE (UACMP) WITH MICROSCOPIC
Bilirubin Urine: NEGATIVE
Glucose, UA: NEGATIVE mg/dL
Hgb urine dipstick: NEGATIVE
Ketones, ur: NEGATIVE mg/dL
Nitrite: NEGATIVE
Protein, ur: NEGATIVE mg/dL
Specific Gravity, Urine: 1.016 (ref 1.005–1.030)
pH: 6 (ref 5.0–8.0)

## 2019-07-18 LAB — I-STAT CHEM 8, ED
BUN: 16 mg/dL (ref 8–23)
Calcium, Ion: 1.09 mmol/L — ABNORMAL LOW (ref 1.15–1.40)
Chloride: 103 mmol/L (ref 98–111)
Creatinine, Ser: 1.5 mg/dL — ABNORMAL HIGH (ref 0.44–1.00)
Glucose, Bld: 86 mg/dL (ref 70–99)
HCT: 30 % — ABNORMAL LOW (ref 36.0–46.0)
Hemoglobin: 10.2 g/dL — ABNORMAL LOW (ref 12.0–15.0)
Potassium: 3.1 mmol/L — ABNORMAL LOW (ref 3.5–5.1)
Sodium: 145 mmol/L (ref 135–145)
TCO2: 31 mmol/L (ref 22–32)

## 2019-07-18 LAB — HEMOGLOBIN A1C
Hgb A1c MFr Bld: 6.2 % — ABNORMAL HIGH (ref 4.8–5.6)
Mean Plasma Glucose: 131.24 mg/dL

## 2019-07-18 LAB — ECHOCARDIOGRAM COMPLETE
Height: 60 in
Weight: 2208 oz

## 2019-07-18 LAB — HIV ANTIBODY (ROUTINE TESTING W REFLEX): HIV Screen 4th Generation wRfx: NONREACTIVE

## 2019-07-18 LAB — TSH: TSH: 0.45 u[IU]/mL (ref 0.350–4.500)

## 2019-07-18 MED ORDER — ENOXAPARIN SODIUM 40 MG/0.4ML ~~LOC~~ SOLN
40.0000 mg | SUBCUTANEOUS | Status: DC
  Administered 2019-07-19 – 2019-07-20 (×2): 40 mg via SUBCUTANEOUS
  Filled 2019-07-18 (×2): qty 0.4

## 2019-07-18 MED ORDER — CARVEDILOL 6.25 MG PO TABS
6.2500 mg | ORAL_TABLET | Freq: Two times a day (BID) | ORAL | Status: DC
  Administered 2019-07-18 – 2019-07-21 (×5): 6.25 mg via ORAL
  Filled 2019-07-18 (×5): qty 1

## 2019-07-18 MED ORDER — CLOPIDOGREL BISULFATE 75 MG PO TABS
75.0000 mg | ORAL_TABLET | Freq: Every day | ORAL | Status: DC
  Administered 2019-07-18 – 2019-07-21 (×4): 75 mg via ORAL
  Filled 2019-07-18 (×4): qty 1

## 2019-07-18 MED ORDER — POTASSIUM CHLORIDE CRYS ER 20 MEQ PO TBCR
40.0000 meq | EXTENDED_RELEASE_TABLET | ORAL | Status: AC
  Administered 2019-07-18 (×2): 40 meq via ORAL
  Filled 2019-07-18 (×2): qty 2

## 2019-07-18 MED ORDER — SODIUM CHLORIDE 0.9 % IV SOLN
INTRAVENOUS | Status: DC
  Administered 2019-07-18 – 2019-07-19 (×3): via INTRAVENOUS

## 2019-07-18 MED ORDER — IOHEXOL 350 MG/ML SOLN
50.0000 mL | Freq: Once | INTRAVENOUS | Status: AC | PRN
  Administered 2019-07-18: 50 mL via INTRAVENOUS

## 2019-07-18 MED ORDER — SODIUM CHLORIDE 0.9 % IV BOLUS
1000.0000 mL | Freq: Once | INTRAVENOUS | Status: AC
  Administered 2019-07-18: 1000 mL via INTRAVENOUS

## 2019-07-18 MED ORDER — AMLODIPINE BESYLATE 10 MG PO TABS: 10.0000 mg | ORAL_TABLET | Freq: Every day | ORAL | Status: DC

## 2019-07-18 NOTE — CV Procedure (Signed)
Echocardiogram not completed, patient is currently in CT.   Darlina Sicilian RDCS

## 2019-07-18 NOTE — ED Notes (Signed)
Patient transported to CT 

## 2019-07-18 NOTE — Consult Note (Signed)
Chief Complaint: Patient was seen in consultation today for image guided diagnostic cerebral angiogram.  Referring Physician(s): Dr. Erlinda Hong  Supervising Physician: Luanne Bras  Patient Status: [facility] - In-pt  History of Present Illness: Melissa Knox is a 68 y.o. female with a past medical history significant for CAD, CHF, HTN, HLD, COPD and CKD II who presented to [facility] ED yesterday with complaints of vision changes x 2 weeks. She reports that over 2 weeks ago she began to have painless, blurry vision in her right eye which recently progressed to near complete vision loss in the lateral aspect of her right visual field. She was seen earlier that day by an eye doctor who recommended she present to the ED to rule out ocular stroke. 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 CT head w/o contrast showed no acute intracranial pathology. Follow up MRI brain/orbits showed no explanation for right sided monocular vision loss. MRA of the head/neck showed severe bilateral carotid bifurcation atherosclerosis with moderate to severe proximal ICA stenoses, worse on the right, loss of flow signal in the brachiocephalic artery suspicious for high-grade stenosis and suspicion of right subclavian steal syndrome. IR has been consulted for image guided diagnostic cerebral angiogram to further evaluate these findings.   Melissa Knox confirms history as detailed above and states she feels like her vision has improved since being in the hospital. She denies any other neurologic complaints. She expresses frustration that she was unclear why she was staying in the hospital however she was seen by Dr. Erlinda Hong just prior to my arrival and states he explained the reasoning for her being here very clearly, which she appreciates very much. She states understanding of the requested procedure and  wishes to proceed.   Past Medical History:  Diagnosis Date   Atherosclerotic heart disease 06/12/2015   CHF (congestive heart failure) (HCC)    Chronic congestive heart failure with left ventricular diastolic dysfunction ([place]) 04/12/2018   CKD (chronic kidney disease) stage 2, GFR 60-89 ml/min 06/12/2015   COPD (chronic obstructive pulmonary disease) (HCC)    Hypercalcemia 06/12/2015   Hyperlipidemia 06/12/2015   Hypertension 06/12/2015   Hypothyroidism 06/12/2015   Insomnia 06/12/2015   Interstitial pneumonia ([place]) 04/12/2018   Pulmonary heart disease ([place]) 06/12/2015   Shortness of breath 06/12/2015   Solitary pulmonary nodule 06/12/2015    Past Surgical History:  Procedure Laterality Date   GALLBLADDER SURGERY  2012    Allergies: Patient has no known allergies.  Medications: Prior to Admission medications   Medication Sig Start Date End Date Taking? Authorizing Provider  albuterol (PROVENTIL) (2.5 MG/3ML) 0.083% nebulizer solution Take 2.5 mg by nebulization every 6 (six) hours as needed for wheezing or shortness of breath.   Yes [provider]  amLODipine (NORVASC) 10 MG tablet Take 1 tablet (10 mg total) by mouth daily. 02/15/19  Yes Boscia, Greer Ee, NP  atorvastatin (LIPITOR) 10 MG tablet Take 1 tablet (10 mg total) by mouth daily at 6 PM. 02/15/19  Yes Boscia, Greer Ee, NP  budesonide-formoterol (SYMBICORT) 160-4.5 MCG/ACT inhaler Inhale 2 puffs into the lungs 2 (two) times daily. 06/22/18  Yes Scarboro, Audie Clear, NP  carvedilol (COREG) 6.25 MG tablet Take 1 tablet (6.25 mg total) by mouth 2 (two) times daily. 07/17/18  Yes Boscia, Greer Ee, NP  docusate sodium (COLACE) 100 MG capsule Take  1 capsule (100 mg total) by mouth 2 (two) times daily as needed for mild constipation. 04/14/18  Yes Gouru, Illene Silver, MD  furosemide (LASIX) 20 MG tablet Take 1 tablet (20 mg total) by mouth daily. 09/14/18  Yes Scarboro, Audie Clear, NP  levothyroxine (SYNTHROID) 50 MCG  tablet Take 1 tablet (50 mcg total) by mouth daily before breakfast. 06/06/19  Yes Boscia, Heather E, NP  loratadine (CLARITIN) 10 MG tablet TAKE 1 TABLET BY MOUTH EVERY DAY FOR ALLERGIES AS NEEDED Patient taking differently: Take 10 mg by mouth daily as needed for allergies.  06/25/19  Yes Boscia, Greer Ee, NP  losartan (COZAAR) 100 MG tablet Take 100 mg by mouth daily.   Yes Lateef, Munsoor, MD  meclizine (ANTIVERT) 12.5 MG tablet Take 12.5 mg by mouth 3 (three) times daily as needed for dizziness.  06/02/16  Yes [provider]  mirtazapine (REMERON) 15 MG tablet TAKE 1/2 OF A TABLET (7.5 MG TOTAL) BY MOUTH AT BEDTIME. Patient taking differently: Take 7.5 mg by mouth at bedtime.  01/31/19  Yes Boscia, Greer Ee, NP  OXYGEN Inhale 3 L into the lungs.   Yes [provider]  pantoprazole (PROTONIX) 40 MG tablet Take 1 tablet (40 mg total) by mouth 2 (two) times daily. 02/15/19  Yes Boscia, Heather E, NP  sucralfate (CARAFATE) 1 g tablet Take 1 tablet (1 g total) by mouth 4 (four) times daily -  with meals and at bedtime. 07/06/19  Yes Ronnell Freshwater, NP     Family History  Problem Relation Age of Onset   Cancer Mother    Hypertension Father    Diabetes Father    Cancer Father     Social History   Socioeconomic History   Marital status: Married    Spouse name: Daryl   Number of children: 3   Years of education: 12   Highest education level: 12th grade  Occupational History   Not on file  Social Needs   Financial resource strain: Not hard at all   Food insecurity    Worry: Never true    Inability: Never true   Transportation needs    Medical: No    Non-medical: No  Tobacco Use   Smoking status: Former Smoker    Quit date: 2013    Years since quitting: 7.8   Smokeless tobacco: Never Used  Substance and Sexual Activity   Alcohol use: No    Alcohol/week: 0.0 standard drinks   Drug use: No   Sexual activity: Yes    Birth control/protection:  None  Lifestyle   Physical activity    Days per week: 2 days    Minutes per session: 30 min   Stress: Not at all  Relationships   Social connections    Talks on phone: Twice a week    Gets together: Once a week    Attends religious service: 1 to 4 times per year    Active member of club or organization: No    Attends meetings of clubs or organizations: Never    Relationship status: Married  Other Topics Concern   Not on file  Social History Narrative   Not on file     Review of Systems: A 12 point ROS discussed and pertinent positives are indicated in the HPI above.  All other systems are negative.  Review of Systems  Constitutional: Negative for appetite change, chills and fever.  HENT: Negative for tinnitus and trouble swallowing.   Eyes: Positive  for visual disturbance (blurry vision right eye - improving per patient). Negative for photophobia and pain.  Respiratory: Negative for cough and shortness of breath.   Cardiovascular: Negative for chest pain.  Gastrointestinal: Negative for abdominal pain, diarrhea, nausea and vomiting.  Musculoskeletal: Negative for back pain and gait problem.  Skin: Negative for rash and wound.  Neurological: Negative for dizziness, facial asymmetry, speech difficulty, weakness, numbness and headaches.    Vital Signs: BP (!) 85/66 (BP Location: Right Arm)    Pulse 83    Temp 98.4 F (36.9 C) (Oral)    Resp 18    Ht 5' (1.524 m)    Wt 138 lb (62.6 kg)    SpO2 93%    BMI 26.95 kg/m   Physical Exam Vitals signs reviewed.  HENT:     Head: Normocephalic.  Cardiovascular:     Rate and Rhythm: Normal rate and regular rhythm.  Pulmonary:     Effort: Pulmonary effort is normal.     Breath sounds: Normal breath sounds.  Abdominal:     General: There is no distension.     Palpations: Abdomen is soft.     Tenderness: There is no abdominal tenderness.  Skin:    General: Skin is warm and dry.  Neurological:     Mental Status: She is alert  and oriented to person, place, and time.  Psychiatric:        Mood and Affect: Mood normal.        Behavior: Behavior normal.        Thought Content: Thought content normal.        Judgment: Judgment normal.      MD Evaluation Airway: WNL Heart: WNL Abdomen: WNL Chest/ Lungs: WNL ASA  Classification: 3 Mallampati/Airway Score: Two   Imaging: Ct Head Wo Contrast  Result Date: 07/17/2019 CLINICAL DATA:  Rule out ocular stroke. Unable to see at the right eye for 2 weeks EXAM: CT HEAD WITHOUT CONTRAST TECHNIQUE: Contiguous axial images were obtained from the base of the skull through the vertex without intravenous contrast. COMPARISON:  05/28/2017 FINDINGS: Brain: No evidence of acute infarction, hemorrhage, extra-axial collection, ventriculomegaly, or mass effect. Small right basal ganglia lacunar infarct. Generalized cerebral atrophy. Periventricular white matter low attenuation likely secondary to microangiopathy. Vascular: Cerebrovascular atherosclerotic calcifications are noted. Skull: Negative for fracture or focal lesion. Sinuses/Orbits: Visualized portions of the orbits are unremarkable. Visualized portions of the paranasal sinuses are unremarkable. Visualized portions of the mastoid air cells are unremarkable. Other: None. IMPRESSION: No acute intracranial pathology. Electronically Signed   By: Kathreen Devoid   On: 07/17/2019 12:57   Ct Angio Neck W Or Wo Contrast  Result Date: 07/18/2019 CLINICAL DATA:  Amaurosis fugax, EXAM: CT ANGIOGRAPHY NECK TECHNIQUE: Multidetector CT imaging of the neck was performed using the standard protocol during bolus administration of intravenous contrast. Multiplanar CT image reconstructions and MIPs were obtained to evaluate the vascular anatomy. Carotid stenosis measurements (when applicable) are obtained utilizing NASCET criteria, using the distal internal carotid diameter as the denominator. CONTRAST:  17mL OMNIPAQUE IOHEXOL 350 MG/ML SOLN  COMPARISON:  MRA head/neck performed earlier the same day 07/18/2019 FINDINGS: Aortic arch: Standard branching. Prominent calcified plaque within the visualized aortic arch and proximal major branch vessels of the neck. Extensive calcified plaque within the proximal innominate artery with high-grade stenosis (suspected at least 80%). Right carotid system: Scattered calcified plaque within the proximal and mid right common carotid artery without significant stenosis (50% or greater). There is  severe calcified atherosclerotic plaque within the distal common carotid artery, at the bifurcation and within the proximal ICA. There is resultant high-grade stenosis of the distal common carotid artery and at the origin of the ICA, although exact quantification of stenosis is difficult due to blooming from prominent irregular calcified plaque. Additional calcified plaque more distally within the right carotid bulb, although without significant stenosis as compared to the more distal vessel. Scattered calcified plaque within the right internal carotid siphon without significant stenosis. Left carotid system: Calcified plaque at the origin of the left common carotid artery without stenosis (50% or greater). Additional scattered calcified plaque within the proximal and mid left common carotid artery without significant stenosis. Prominent calcified plaque within the distal common carotid artery with less than 50% stenosis. Significant calcified plaque also present at the carotid bifurcation and within the proximal left ICA. Resultant 30% narrowing of the proximal left ICA as compared to more distal vessel. Scattered calcified plaque within the left internal carotid artery siphon without significant stenosis. Vertebral arteries: Dominant left vertebral artery. Calcified plaque at the origin of the non dominant right vertebral artery without significant ostial stenosis. The vertebral arteries are otherwise patent within the neck  without significant stenosis (50% or greater). The intracranial vertebral arteries are patent without significant stenosis, as is the basilar artery. Skeleton: No acute bony abnormality. Cervical levocurvature. Cervical spondylosis greatest at C5-C6 where there is a small posterior disc osteophyte. 6 mm C3-C4 grade I/II anterolisthesis. Other neck: No soft tissue neck mass or pathologically enlarged cervical chain lymph nodes. 4 mm nodule within the right thyroid lobe. Upper chest: Redemonstrated prominent centrilobular and paraseptal emphysema within the imaged lung apices with associated right apical pleuroparenchymal scarring. IMPRESSION: 1. Prominent calcified plaque results in high-grade (likely greater than 80%) stenosis of the proximal innominate artery. The right vertebral artery is patent. However, please note there was suspicion for right subclavian steal phenomenon on MRA neck performed earlier the same day. 2. Prominent calcified plaque within the distal right common carotid artery, at the bifurcation and within the proximal ICA. Resultant high-grade stenosis of the distal right common carotid artery and at the origin of the right ICA. Exact quantification of stenosis is difficult due to blooming from prominent calcified plaque. Consider carotid artery duplex for further evaluation. 3. Prominent calcified plaque within the distal left common carotid artery, at the bifurcation and within the proximal ICA. Less than 50% stenosis of the distal left CCA. Estimated 30% stenosis at the origin of the left ICA. 4. Bilateral vertebral arteries patent within the neck without significant stenosis (50% or greater). 5. Cervical spondylosis with 6 mm C3-C4 degenerative grade I/II anterolisthesis. Electronically Signed   By: Kellie Simmering DO   On: 07/18/2019 09:08   Mr Jodene Nam Head Wo Contrast  Result Date: 07/18/2019 CLINICAL DATA:  68 year old female with right monocular nasal field vision loss x2 weeks. Awoke with  symptoms, without improvement since onset. 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 EXAM: MRA HEAD WITHOUT CONTRAST MRA NECK WITHOUT CONTRAST TECHNIQUE: Angiographic images of the Circle of Willis were obtained using MRA technique without intravenous contrast. Angiographic images of the neck were obtained using MRA technique without intravenous contrast. Carotid stenosis measurements (when applicable) are obtained utilizing NASCET criteria, using the distal internal carotid diameter as the denominator. COMPARISON:  Brain and orbit MRI earlier tonight. FINDINGS: MRA NECK FINDINGS Antegrade flow in the left vertebral artery from the thoracic inlet  to the skull base, but no antegrade flow signal in the right Vertebral despite a preserved distal right vertebral artery flow void earlier tonight. The cervical left vertebral artery appears normal. Evidence of a 3 vessel arch configuration. Flow signal gap at the brachiocephalic artery of unclear significance, but conspicuous compared to the preserved flow signal in the proximal left CCA and subclavian arteries. Antegrade flow in the bilateral cervical carotid arteries, although with severe right carotid bifurcation irregularity and up to high-grade stenosis of the right ICA origin (series 1003, image 15). Beyond the bulb flow signal in the cervical right ICA appears normal. Normal left CCA up to the bifurcation. Moderate irregularity of the left carotid bifurcation and at least moderate stenosis of the proximal left ICA (series 1009, image 8). Distal to the bulb left ICA flow signal appears normal. MRA HEAD FINDINGS There is antegrade flow signal in both distal vertebral arteries on these images although the left appears dominant. No distal vertebral stenosis. Both PICA origins are patent. Patent vertebrobasilar junction and basilar artery without stenosis. Patent SCA and PCA origins. Tortuous left  P1, possibly with a fetal type origin. Right posterior communicating artery is present. Bilateral PCA branches are within normal limits. Antegrade flow in both ICA siphons appears symmetric. Bilateral siphon irregularity in keeping with atherosclerosis, but no significant siphon stenosis. Patent and normal ophthalmic artery origins. Flow signal in the left ophthalmic artery does appear asymmetrically greater on series 5, image 94. Patent carotid termini. Normal MCA and ACA origins. Visible ACA branches are normal aside from mild tortuosity. Tortuous left MCA M1 segment with patent left MCA trifurcation, no stenosis. Right MCA M1 and right MCA bifurcation are patent without stenosis. Visible bilateral MCA branches are within normal limits. IMPRESSION: 1. 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 2. Loss of flow signal in the Brachiocephalic Artery also suspicious for high-grade stenosis. 3. ICA siphon atherosclerosis without significant stenosis. Patent ophthalmic artery origins, although suggestion of decreased ophthalmic artery flow on the right. 4. 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 Electronically Signed   By: Genevie Ann M.D.   On: 07/18/2019 03:32   Mr Angio Neck Wo Contrast  Result Date: 07/18/2019 CLINICAL DATA:  69 year old female with right monocular nasal field vision loss x2 weeks. Awoke with symptoms, without improvement since onset. 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 EXAM: MRA HEAD WITHOUT CONTRAST MRA NECK WITHOUT CONTRAST TECHNIQUE: Angiographic images of the Circle of Willis were obtained using MRA technique without intravenous contrast. Angiographic images of the neck were obtained  using MRA technique without intravenous contrast. Carotid stenosis measurements (when applicable) are obtained utilizing NASCET criteria, using the distal internal carotid diameter as the denominator. COMPARISON:  Brain and orbit MRI earlier tonight. FINDINGS: MRA NECK FINDINGS Antegrade flow in the left vertebral artery from the thoracic inlet to the skull base, but no antegrade flow signal in the right Vertebral despite a preserved distal right vertebral artery flow void earlier tonight. The cervical left vertebral artery appears normal. Evidence of a 3 vessel arch configuration. Flow signal gap at the brachiocephalic artery of unclear significance, but conspicuous compared to the preserved flow signal in the proximal left CCA and subclavian arteries. Antegrade flow in the bilateral cervical carotid arteries, although with severe  right carotid bifurcation irregularity and up to high-grade stenosis of the right ICA origin (series 1003, image 15). Beyond the bulb flow signal in the cervical right ICA appears normal. Normal left CCA up to the bifurcation. Moderate irregularity of the left carotid bifurcation and at least moderate stenosis of the proximal left ICA (series 1009, image 8). Distal to the bulb left ICA flow signal appears normal. MRA HEAD FINDINGS There is antegrade flow signal in both distal vertebral arteries on these images although the left appears dominant. No distal vertebral stenosis. Both PICA origins are patent. Patent vertebrobasilar junction and basilar artery without stenosis. Patent SCA and PCA origins. Tortuous left P1, possibly with a fetal type origin. Right posterior communicating artery is present. Bilateral PCA branches are within normal limits. Antegrade flow in both ICA siphons appears symmetric. Bilateral siphon irregularity in keeping with atherosclerosis, but no significant siphon stenosis. Patent and normal ophthalmic artery origins. Flow signal in the left ophthalmic artery does  appear asymmetrically greater on series 5, image 94. Patent carotid termini. Normal MCA and ACA origins. Visible ACA branches are normal aside from mild tortuosity. Tortuous left MCA M1 segment with patent left MCA trifurcation, no stenosis. Right MCA M1 and right MCA bifurcation are patent without stenosis. Visible bilateral MCA branches are within normal limits. IMPRESSION: 1. 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 2. Loss of flow signal in the Brachiocephalic Artery also suspicious for high-grade stenosis. 3. ICA siphon atherosclerosis without significant stenosis. Patent ophthalmic artery origins, although suggestion of decreased ophthalmic artery flow on the right. 4. 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 Electronically Signed   By: Genevie Ann M.D.   On: 07/18/2019 03:32   Mr Brain Wo Contrast  Result Date: 07/17/2019 CLINICAL DATA:  68 year old female with right monocular nasal field vision loss x2 weeks. Awoke with symptoms, which remain unchanged-without improvement since on set. EXAM: MRI HEAD WITHOUT CONTRAST MRI ORBITS WITHOUT AND WITH CONTRAST TECHNIQUE: Multiplanar, multiecho pulse sequences of the brain and surrounding structures were obtained without and with intravenous contrast. Multiplanar, multiecho pulse sequences of the orbits and surrounding structures were obtained including fat saturation techniques, before and after intravenous contrast administration. CONTRAST:  61mL GADAVIST GADOBUTROL 1 MMOL/ML IV SOLN COMPARISON:  Head CT earlier today. FINDINGS: MRI HEAD FINDINGS Brain: No restricted diffusion to suggest acute infarction. No midline shift, mass effect, evidence of mass lesion, ventriculomegaly, extra-axial collection or acute intracranial  hemorrhage. Cervicomedullary junction and pituitary are within normal limits. Scattered small cerebral white matter T2 and FLAIR hyperintense foci in both hemispheres, mild to moderate for age and in a nonspecific pattern. No cortical encephalomalacia or chronic cerebral blood products identified. 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 Otherwise negative deep gray matter nuclei, brainstem and cerebellum. Vascular: Major intracranial vascular flow voids are preserved. Skull and upper cervical spine: Advanced cervical spine degeneration including spondylolisthesis at C3-C4. Evidence of spinal stenosis from C3 inferiorly (series 7, image 10). Normal background bone marrow signal. Other: Visible internal auditory structures appear normal. Mastoids are clear. Scalp and face soft tissues appear negative. MRI ORBITS FINDINGS Orbits: Normal suprasellar cistern. The optic chiasm appears normal. Unremarkable cavernous sinus. Symmetric appearing pre chiasmatic optic nerves. No optic nerve T2 hyperintensity or enhancement. Both globes appear  normal. No intraorbital mass or inflammation. No abnormal intraorbital enhancement. Gaze does appear to be mildly Disconjugate on series 4, image 9. Visualized sinuses: Mild ethmoid sinus mucosal thickening more pronounced on the right. No complicating features. Soft tissues: Superficial and deep soft tissue spaces of the face appear symmetric and normal. IMPRESSION: 1. Symmetric and normal MRI appearance of the orbits. No explanation for right side monocular vision loss. Gaze does appear to be mildly disconjugate. 2.  No acute intracranial abnormality. Mild to moderate for age signal changes in the brain, including a small chronic lacune in the right caudate, compatible with small vessel disease. 3. 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 Electronically Signed   By: Genevie Ann M.D.   On: 07/17/2019 19:41   Vas US  Carotid  Result Date: 07/18/2019 Carotid Arterial Duplex Study Risk Factors: Hypertension, hyperlipidemia. Performing Technologist: Maudry Mayhew MHA, RDMS, RVT, RDCS  Examination Guidelines: A complete evaluation includes B-mode imaging, spectral Doppler, color Doppler, and power Doppler as needed of all accessible portions of each vessel. Bilateral testing is considered an integral part of a complete examination. Limited examinations for reoccurring indications may be performed as noted.  Right Carotid Findings: +----------+-------+-------+--------+---------------------------------+--------+             PSV     EDV     Stenosis Plaque Description                Comments              cm/s    cm/s                                                         +----------+-------+-------+--------+---------------------------------+--------+  CCA Prox   63      13                                                           +----------+-------+-------+--------+---------------------------------+--------+  CCA Distal 37      15                                                           +----------+-------+-------+--------+---------------------------------+--------+  ICA Prox   157     59               heterogenous, irregular and                                                      calcific                                    +----------+-------+-------+--------+---------------------------------+--------+  ICA Distal 27      10                                                           +----------+-------+-------+--------+---------------------------------+--------+ +----------+--------+-------+----------+-------------------+  PSV cm/s EDV cms Describe   Arm Pressure (mmHG)  +----------+--------+-------+----------+-------------------+  Subclavian 115              Monophasic                      +----------+--------+-------+----------+-------------------+ +---------+--------+---+--------+----------+  Vertebral PSV  cm/s -92 EDV cm/s Retrograde  +---------+--------+---+--------+----------+  Left Carotid Findings: +----------+-------+-------+--------+---------------------------------+--------+             PSV     EDV     Stenosis Plaque Description                Comments              cm/s    cm/s                                                         +----------+-------+-------+--------+---------------------------------+--------+  CCA Prox   107     17                                                           +----------+-------+-------+--------+---------------------------------+--------+  CCA Distal 89      17               smooth and heterogenous                     +----------+-------+-------+--------+---------------------------------+--------+  ICA Prox   195     43               heterogenous, calcific and                                                       irregular                                   +----------+-------+-------+--------+---------------------------------+--------+  ICA Distal 176     31                                                           +----------+-------+-------+--------+---------------------------------+--------+  ECA        191                                                                  +----------+-------+-------+--------+---------------------------------+--------+ +----------+--------+--------+----------------+-------------------+             PSV cm/s EDV cm/s Describe         Arm Pressure (mmHG)  +----------+--------+--------+----------------+-------------------+  Subclavian 252  Multiphasic, WNL                      +----------+--------+--------+----------------+-------------------+ +---------+--------+---+--------+--+---------+  Vertebral PSV cm/s 234 EDV cm/s 16 Antegrade  +---------+--------+---+--------+--+---------+  Summary: Right Carotid: Velocities in the right ICA are consistent with a 40-59%                stenosis. Left Carotid: Velocities in the left ICA are  consistent with a 40-59% stenosis. Vertebrals:  Left vertebral artery demonstrates antegrade flow. Right vertebral              artery demonstrates retrograde flow, suggestive of proximal              obstruction. Subclavians: Normal flow hemodynamics were seen in the left subclavian artery.              Right subclavian artery exhibits monophasic flow, suggestive of              proximal obstruction. *See table(s) above for measurements and observations.     Preliminary    Mr Rosealee Albee Wo Contrast  Result Date: 07/17/2019 CLINICAL DATA:  68 year old female with right monocular nasal field vision loss x2 weeks. Awoke with symptoms, which remain unchanged-without improvement since on set. EXAM: MRI HEAD WITHOUT CONTRAST MRI ORBITS WITHOUT AND WITH CONTRAST TECHNIQUE: Multiplanar, multiecho pulse sequences of the brain and surrounding structures were obtained without and with intravenous contrast. Multiplanar, multiecho pulse sequences of the orbits and surrounding structures were obtained including fat saturation techniques, before and after intravenous contrast administration. CONTRAST:  11mL GADAVIST GADOBUTROL 1 MMOL/ML IV SOLN COMPARISON:  Head CT earlier today. FINDINGS: MRI HEAD FINDINGS Brain: No restricted diffusion to suggest acute infarction. No midline shift, mass effect, evidence of mass lesion, ventriculomegaly, extra-axial collection or acute intracranial hemorrhage. Cervicomedullary junction and pituitary are within normal limits. Scattered small cerebral white matter T2 and FLAIR hyperintense foci in both hemispheres, mild to moderate for age and in a nonspecific pattern. No cortical encephalomalacia or chronic cerebral blood products identified. 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 Otherwise negative deep gray matter nuclei, brainstem and cerebellum. Vascular: Major intracranial vascular flow voids are preserved. Skull and upper cervical spine:  Advanced cervical spine degeneration including spondylolisthesis at C3-C4. Evidence of spinal stenosis from C3 inferiorly (series 7, image 10). Normal background bone marrow signal. Other: Visible internal auditory structures appear normal. Mastoids are clear. Scalp and face soft tissues appear negative. MRI ORBITS FINDINGS Orbits: Normal suprasellar cistern. The optic chiasm appears normal. Unremarkable cavernous sinus. Symmetric appearing pre chiasmatic optic nerves. No optic nerve T2 hyperintensity or enhancement. Both globes appear normal. No intraorbital mass or inflammation. No abnormal intraorbital enhancement. Gaze does appear to be mildly Disconjugate on series 4, image 9. Visualized sinuses: Mild ethmoid sinus mucosal thickening more pronounced on the right. No complicating features. Soft tissues: Superficial and deep soft tissue spaces of the face appear symmetric and normal. IMPRESSION: 1. Symmetric and normal MRI appearance of the orbits. No explanation for right side monocular vision loss. Gaze does appear to be mildly disconjugate. 2.  No acute intracranial abnormality. Mild to moderate for age signal changes in the brain, including a small chronic lacune in the right caudate, compatible with small vessel disease. 3. 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 Electronically Signed   By: Genevie Ann M.D.   On: 07/17/2019 19:41    Labs:  CBC:  Recent Labs    09/08/18 0340 09/22/18 1013 07/17/19 1222 07/17/19 1635 07/18/19 0400  WBC 14.7* 9.8 6.8  --  7.0  HGB 9.6* 10.1* 8.6* 10.2* 8.6*  HCT 32.2* 32.8* 29.2* 30.0* 28.9*  PLT 382 383 457*  --  459*    COAGS: Recent Labs    07/17/19 1222  INR 1.1  APTT 27    BMP: Recent Labs    09/08/18 0340 09/22/18 1013 07/17/19 1222 07/17/19 1635 07/18/19 0400  NA 145 140 138 145 143  K 3.5 4.2 2.7* 3.1* 2.9*  CL 113* 99 101 103 105  CO2 25 24 22   --  25  GLUCOSE 115* 85 93 86 95  BUN 25* 20 13 16 13     CALCIUM 8.7* 8.9 8.2*  --  8.4*  CREATININE 1.05* 1.47* 1.67* 1.50* 1.43*  GFRNONAA 55* 37* 31*  --  38*  GFRAA >60 42* 36*  --  44*    LIVER FUNCTION TESTS: Recent Labs    09/05/18 0648 09/22/18 1013 07/17/19 1222  BILITOT 0.6 0.2 0.5  AST 17 17 15   ALT 9 22 10   ALKPHOS 66 76 70  PROT 7.8 6.8 7.7  ALBUMIN 2.9* 3.5* 3.3*    TUMOR MARKERS: No results for input(s): AFPTM, CEA, CA199, CHROMGRNA in the last 8760 hours.  Assessment and Plan:  68 y/o F with worsening right sided vision loss who presented to [facility] ED yesterday at the request of her eye doctor due to concern for ocular stroke. 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 NIR has been consulted for diagnostic cerebral angiogram to further evaluate the above findings - patient history and imaging has been reviewed by Dr. Estanislado Pandy who approves procedure. Will tentatively plan on procedure tomorrow (11/19) pending any emergent procedures - patient and family are aware of this and agree.  Patient to be NPO after midnight until post procedure, continue Plavix and ASA, hold Lovenox until post procedure, IR will call for patient when ready. Dr. Estanislado Pandy will discuss findings and further plans with patient/family post procedure.  Risks and benefits of diagnostic cerebral angiogram were discussed with the patient including, but not limited to bleeding, infection, vascular injury, stroke, or contrast induced renal failure. This interventional procedure involves the use of X-rays and because of the nature of the planned procedure, it is possible that we will have prolonged use of X-ray fluoroscopy. Potential radiation risks to you include (but are not limited to) the following: - A  slightly elevated risk for cancer  several years later in life. This risk is typically less than 0.5% percent. This risk is low in comparison to the normal incidence of human cancer, which is 33% for women and 50% for men according to the [employer]. - Radiation induced injury can include skin redness, resembling a rash, tissue breakdown / ulcers and hair loss (which can be temporary or permanent).  The likelihood of either of these occurring depends on the difficulty of the procedure and whether you are sensitive to radiation due to previous procedures, disease, or genetic conditions.  IF your procedure requires a prolonged use of radiation, you will be notified and given written instructions for further action.  It is your responsibility to monitor the irradiated area for the 2 weeks  following the procedure and to notify your physician if you are concerned that you have suffered a radiation induced injury.   All of the patient's questions were answered, patient is agreeable to proceed. Consent signed and in chart.  Thank you for this interesting consult.  I greatly enjoyed meeting Melissa Knox and look forward to participating in their care.  A copy of this report was sent to the requesting provider on this date.  Electronically Signed: Joaquim Nam, PA-C 07/18/2019, 2:59 PM   I spent a total of 20 Minutesin face to face in clinical consultation, greater than 50% of which was counseling/coordinating care for diagnostic cerebral angiogram.

## 2019-07-18 NOTE — ED Notes (Signed)
Report given to 3W, pt is eating lunch at the bedside

## 2019-07-18 NOTE — Progress Notes (Signed)
Attempted report x 1 ED RN not avalible

## 2019-07-18 NOTE — ED Notes (Signed)
Pt walked to the bathroom where she voided.  Pt was able to ambulate independently without any difficulty. Pt is alert and oriented.  Husband is visiting pt.

## 2019-07-18 NOTE — Progress Notes (Signed)
  Echocardiogram 2D Echocardiogram has been performed.  Darlina Sicilian M 07/18/2019, 9:11 AM

## 2019-07-18 NOTE — ED Notes (Signed)
Patient transported to MRI 

## 2019-07-18 NOTE — Progress Notes (Signed)
STROKE TEAM PROGRESS NOTE   INTERVAL HISTORY Pt son at bedside. Pt still has right eye partial visual loss, no pain. 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 Dr. Pietro Cassis has contacted Dr. Estanislado Pandy for IR intervention.   Vitals:   07/18/19 0600 07/18/19 0800 07/18/19 0900 07/18/19 1206  BP: (!) 116/51 (!) 133/57 134/60 93/67  Pulse: 68 75 74   Resp: 19 17 18    Temp:      TempSrc:      SpO2: 100% 100% 100%   Weight:      Height:        CBC:  Recent Labs  Lab 07/17/19 1222 07/17/19 1635 07/18/19 0400  WBC 6.8  --  7.0  NEUTROABS 4.1  --   --   HGB 8.6* 10.2* 8.6*  HCT 29.2* 30.0* 28.9*  MCV 80.4  --  81.4  PLT 457*  --  459*    Basic Metabolic Panel:  Recent Labs  Lab 07/17/19 1222 07/17/19 1520 07/17/19 1635 07/18/19 0400  NA 138  --  145 143  K 2.7*  --  3.1* 2.9*  CL 101  --  103 105  CO2 22  --   --  25  GLUCOSE 93  --  86 95  BUN 13  --  16 13  CREATININE 1.67*  --  1.50* 1.43*  CALCIUM 8.2*  --   --  8.4*  MG  --  1.3*  --   --    Lipid Panel:     Component Value Date/Time   CHOL 148 07/18/2019 0400   TRIG 113 07/18/2019 0400   HDL 52 07/18/2019 0400   CHOLHDL 2.8 07/18/2019 0400   VLDL 23 07/18/2019 0400   LDLCALC 73 07/18/2019 0400   HgbA1c:  Lab Results  Component Value Date   HGBA1C 6.2 (H) 07/18/2019   Urine Drug Screen: No results found for: LABOPIA, COCAINSCRNUR, LABBENZ, AMPHETMU, THCU, LABBARB  Alcohol Level No results found for: ETH  IMAGING Ct Head Wo Contrast  Result Date: 07/17/2019 CLINICAL DATA:  Rule out ocular stroke. Unable to see at the right eye for 2 weeks EXAM: CT HEAD WITHOUT CONTRAST TECHNIQUE: Contiguous axial images were obtained from the base of the skull through the vertex without intravenous contrast. COMPARISON:  05/28/2017 FINDINGS: Brain: No evidence of acute infarction, hemorrhage, extra-axial  collection, ventriculomegaly, or mass effect. Small right basal ganglia lacunar infarct. Generalized cerebral atrophy. Periventricular white matter low attenuation likely secondary to microangiopathy. Vascular: Cerebrovascular atherosclerotic calcifications are noted. Skull: Negative for fracture or focal lesion. Sinuses/Orbits: Visualized portions of the orbits are unremarkable. Visualized portions of the paranasal sinuses are unremarkable. Visualized portions of the mastoid air cells are unremarkable. Other: None. IMPRESSION: No acute intracranial pathology. Electronically Signed   By: Kathreen Devoid   On: 07/17/2019 12:57   Ct Angio Neck W Or Wo Contrast  Result Date: 07/18/2019 CLINICAL DATA:  Amaurosis fugax, EXAM: CT ANGIOGRAPHY NECK TECHNIQUE: Multidetector CT imaging of the neck was performed using the standard protocol during bolus administration of intravenous contrast. Multiplanar CT image reconstructions and MIPs were obtained to evaluate the vascular anatomy. Carotid stenosis measurements (when applicable) are obtained utilizing NASCET criteria, using the distal internal carotid diameter as the denominator. CONTRAST:  15mL OMNIPAQUE IOHEXOL 350 MG/ML SOLN COMPARISON:  MRA head/neck performed earlier the same day 07/18/2019 FINDINGS: Aortic arch: Standard  branching. Prominent calcified plaque within the visualized aortic arch and proximal major branch vessels of the neck. Extensive calcified plaque within the proximal innominate artery with high-grade stenosis (suspected at least 80%). Right carotid system: Scattered calcified plaque within the proximal and mid right common carotid artery without significant stenosis (50% or greater). There is severe calcified atherosclerotic plaque within the distal common carotid artery, at the bifurcation and within the proximal ICA. There is resultant high-grade stenosis of the distal common carotid artery and at the origin of the ICA, although exact  quantification of stenosis is difficult due to blooming from prominent irregular calcified plaque. Additional calcified plaque more distally within the right carotid bulb, although without significant stenosis as compared to the more distal vessel. Scattered calcified plaque within the right internal carotid siphon without significant stenosis. Left carotid system: Calcified plaque at the origin of the left common carotid artery without stenosis (50% or greater). Additional scattered calcified plaque within the proximal and mid left common carotid artery without significant stenosis. Prominent calcified plaque within the distal common carotid artery with less than 50% stenosis. Significant calcified plaque also present at the carotid bifurcation and within the proximal left ICA. Resultant 30% narrowing of the proximal left ICA as compared to more distal vessel. Scattered calcified plaque within the left internal carotid artery siphon without significant stenosis. Vertebral arteries: Dominant left vertebral artery. Calcified plaque at the origin of the non dominant right vertebral artery without significant ostial stenosis. The vertebral arteries are otherwise patent within the neck without significant stenosis (50% or greater). The intracranial vertebral arteries are patent without significant stenosis, as is the basilar artery. Skeleton: No acute bony abnormality. Cervical levocurvature. Cervical spondylosis greatest at C5-C6 where there is a small posterior disc osteophyte. 6 mm C3-C4 grade I/II anterolisthesis. Other neck: No soft tissue neck mass or pathologically enlarged cervical chain lymph nodes. 4 mm nodule within the right thyroid lobe. Upper chest: Redemonstrated prominent centrilobular and paraseptal emphysema within the imaged lung apices with associated right apical pleuroparenchymal scarring. IMPRESSION: 1. Prominent calcified plaque results in high-grade (likely greater than 80%) stenosis of the  proximal innominate artery. The right vertebral artery is patent. However, please note there was suspicion for right subclavian steal phenomenon on MRA neck performed earlier the same day. 2. Prominent calcified plaque within the distal right common carotid artery, at the bifurcation and within the proximal ICA. Resultant high-grade stenosis of the distal right common carotid artery and at the origin of the right ICA. Exact quantification of stenosis is difficult due to blooming from prominent calcified plaque. Consider carotid artery duplex for further evaluation. 3. Prominent calcified plaque within the distal left common carotid artery, at the bifurcation and within the proximal ICA. Less than 50% stenosis of the distal left CCA. Estimated 30% stenosis at the origin of the left ICA. 4. Bilateral vertebral arteries patent within the neck without significant stenosis (50% or greater). 5. Cervical spondylosis with 6 mm C3-C4 degenerative grade I/II anterolisthesis. Electronically Signed   By: Kellie Simmering DO   On: 07/18/2019 09:08   Mr Jodene Nam Head Wo Contrast  Result Date: 07/18/2019 CLINICAL DATA:  68 year old female with right monocular nasal field vision loss x2 weeks. Awoke with symptoms, without improvement since onset. 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 EXAM: MRA HEAD WITHOUT CONTRAST MRA NECK WITHOUT CONTRAST TECHNIQUE: Angiographic images of the Circle of Willis were obtained using MRA technique without intravenous contrast.  Angiographic images of the neck were obtained using MRA technique without intravenous contrast. Carotid stenosis measurements (when applicable) are obtained utilizing NASCET criteria, using the distal internal carotid diameter as the denominator. COMPARISON:  Brain and orbit MRI earlier tonight. FINDINGS: MRA NECK FINDINGS Antegrade flow in the left vertebral artery from the thoracic inlet to the skull  base, but no antegrade flow signal in the right Vertebral despite a preserved distal right vertebral artery flow void earlier tonight. The cervical left vertebral artery appears normal. Evidence of a 3 vessel arch configuration. Flow signal gap at the brachiocephalic artery of unclear significance, but conspicuous compared to the preserved flow signal in the proximal left CCA and subclavian arteries. Antegrade flow in the bilateral cervical carotid arteries, although with severe right carotid bifurcation irregularity and up to high-grade stenosis of the right ICA origin (series 1003, image 15). Beyond the bulb flow signal in the cervical right ICA appears normal. Normal left CCA up to the bifurcation. Moderate irregularity of the left carotid bifurcation and at least moderate stenosis of the proximal left ICA (series 1009, image 8). Distal to the bulb left ICA flow signal appears normal. MRA HEAD FINDINGS There is antegrade flow signal in both distal vertebral arteries on these images although the left appears dominant. No distal vertebral stenosis. Both PICA origins are patent. Patent vertebrobasilar junction and basilar artery without stenosis. Patent SCA and PCA origins. Tortuous left P1, possibly with a fetal type origin. Right posterior communicating artery is present. Bilateral PCA branches are within normal limits. Antegrade flow in both ICA siphons appears symmetric. Bilateral siphon irregularity in keeping with atherosclerosis, but no significant siphon stenosis. Patent and normal ophthalmic artery origins. Flow signal in the left ophthalmic artery does appear asymmetrically greater on series 5, image 94. Patent carotid termini. Normal MCA and ACA origins. Visible ACA branches are normal aside from mild tortuosity. Tortuous left MCA M1 segment with patent left MCA trifurcation, no stenosis. Right MCA M1 and right MCA bifurcation are patent without stenosis. Visible bilateral MCA branches are within normal  limits. IMPRESSION: 1. 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 2. Loss of flow signal in the Brachiocephalic Artery also suspicious for high-grade stenosis. 3. ICA siphon atherosclerosis without significant stenosis. Patent ophthalmic artery origins, although suggestion of decreased ophthalmic artery flow on the right. 4. 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 Electronically Signed   By: Genevie Ann M.D.   On: 07/18/2019 03:32   Mr Angio Neck Wo Contrast  Result Date: 07/18/2019 CLINICAL DATA:  68 year old female with right monocular nasal field vision loss x2 weeks. Awoke with symptoms, without improvement since onset. 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 EXAM: MRA HEAD WITHOUT CONTRAST MRA NECK WITHOUT CONTRAST TECHNIQUE: Angiographic images of the Circle of Willis were obtained using MRA technique without intravenous contrast. Angiographic images of the neck were obtained using MRA technique without intravenous contrast. Carotid stenosis measurements (when applicable) are obtained utilizing NASCET criteria, using the distal internal carotid diameter as the denominator. COMPARISON:  Brain and orbit MRI earlier tonight. FINDINGS: MRA NECK FINDINGS Antegrade flow in the left vertebral artery from the thoracic inlet to the skull base, but no antegrade flow signal in the right Vertebral despite a preserved distal right vertebral artery  flow void earlier tonight. The cervical left vertebral artery appears normal. Evidence of a 3 vessel arch configuration. Flow signal gap at the brachiocephalic artery of unclear significance, but conspicuous compared to the preserved flow signal in the proximal left CCA and  subclavian arteries. Antegrade flow in the bilateral cervical carotid arteries, although with severe right carotid bifurcation irregularity and up to high-grade stenosis of the right ICA origin (series 1003, image 15). Beyond the bulb flow signal in the cervical right ICA appears normal. Normal left CCA up to the bifurcation. Moderate irregularity of the left carotid bifurcation and at least moderate stenosis of the proximal left ICA (series 1009, image 8). Distal to the bulb left ICA flow signal appears normal. MRA HEAD FINDINGS There is antegrade flow signal in both distal vertebral arteries on these images although the left appears dominant. No distal vertebral stenosis. Both PICA origins are patent. Patent vertebrobasilar junction and basilar artery without stenosis. Patent SCA and PCA origins. Tortuous left P1, possibly with a fetal type origin. Right posterior communicating artery is present. Bilateral PCA branches are within normal limits. Antegrade flow in both ICA siphons appears symmetric. Bilateral siphon irregularity in keeping with atherosclerosis, but no significant siphon stenosis. Patent and normal ophthalmic artery origins. Flow signal in the left ophthalmic artery does appear asymmetrically greater on series 5, image 94. Patent carotid termini. Normal MCA and ACA origins. Visible ACA branches are normal aside from mild tortuosity. Tortuous left MCA M1 segment with patent left MCA trifurcation, no stenosis. Right MCA M1 and right MCA bifurcation are patent without stenosis. Visible bilateral MCA branches are within normal limits. IMPRESSION: 1. 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 2. Loss of flow signal in the Brachiocephalic Artery also suspicious for high-grade stenosis. 3. ICA siphon atherosclerosis without significant stenosis. Patent ophthalmic artery origins, although suggestion of decreased ophthalmic artery  flow on the right. 4. 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 Electronically Signed   By: Genevie Ann M.D.   On: 07/18/2019 03:32   Mr Brain Wo Contrast  Result Date: 07/17/2019 CLINICAL DATA:  68 year old female with right monocular nasal field vision loss x2 weeks. Awoke with symptoms, which remain unchanged-without improvement since on set. EXAM: MRI HEAD WITHOUT CONTRAST MRI ORBITS WITHOUT AND WITH CONTRAST TECHNIQUE: Multiplanar, multiecho pulse sequences of the brain and surrounding structures were obtained without and with intravenous contrast. Multiplanar, multiecho pulse sequences of the orbits and surrounding structures were obtained including fat saturation techniques, before and after intravenous contrast administration. CONTRAST:  4mL GADAVIST GADOBUTROL 1 MMOL/ML IV SOLN COMPARISON:  Head CT earlier today. FINDINGS: MRI HEAD FINDINGS Brain: No restricted diffusion to suggest acute infarction. No midline shift, mass effect, evidence of mass lesion, ventriculomegaly, extra-axial collection or acute intracranial hemorrhage. Cervicomedullary junction and pituitary are within normal limits. Scattered small cerebral white matter T2 and FLAIR hyperintense foci in both hemispheres, mild to moderate for age and in a nonspecific pattern. No cortical encephalomalacia or chronic cerebral blood products identified. 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 Otherwise negative deep gray matter nuclei, brainstem and cerebellum. Vascular: Major intracranial vascular flow voids are preserved. Skull and upper cervical spine: Advanced cervical spine degeneration including spondylolisthesis at C3-C4. Evidence of spinal stenosis from C3 inferiorly (series 7,  image 10). Normal background bone marrow signal. Other: Visible  internal auditory structures appear normal. Mastoids are clear. Scalp and face soft tissues appear negative. MRI ORBITS FINDINGS Orbits: Normal suprasellar cistern. The optic chiasm appears normal. Unremarkable cavernous sinus. Symmetric appearing pre chiasmatic optic nerves. No optic nerve T2 hyperintensity or enhancement. Both globes appear normal. No intraorbital mass or inflammation. No abnormal intraorbital enhancement. Gaze does appear to be mildly Disconjugate on series 4, image 9. Visualized sinuses: Mild ethmoid sinus mucosal thickening more pronounced on the right. No complicating features. Soft tissues: Superficial and deep soft tissue spaces of the face appear symmetric and normal. IMPRESSION: 1. Symmetric and normal MRI appearance of the orbits. No explanation for right side monocular vision loss. Gaze does appear to be mildly disconjugate. 2.  No acute intracranial abnormality. Mild to moderate for age signal changes in the brain, including a small chronic lacune in the right caudate, compatible with small vessel disease. 3. 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 Electronically Signed   By: Genevie Ann M.D.   On: 07/17/2019 19:41   Mr Rosealee Albee [place] Contrast  Result Date: 07/17/2019 CLINICAL DATA:  68 year old female with right monocular nasal field vision loss x2 weeks. Awoke with symptoms, which remain unchanged-without improvement since on set. EXAM: MRI HEAD WITHOUT CONTRAST MRI ORBITS WITHOUT AND WITH CONTRAST TECHNIQUE: Multiplanar, multiecho pulse sequences of the brain and surrounding structures were obtained without and with intravenous contrast. Multiplanar, multiecho pulse sequences of the orbits and surrounding structures were obtained including fat saturation techniques, before and after intravenous contrast administration. CONTRAST:  35mL GADAVIST GADOBUTROL 1 MMOL/ML IV SOLN COMPARISON:  Head CT earlier today. FINDINGS: MRI HEAD FINDINGS Brain: No restricted  diffusion to suggest acute infarction. No midline shift, mass effect, evidence of mass lesion, ventriculomegaly, extra-axial collection or acute intracranial hemorrhage. Cervicomedullary junction and pituitary are within normal limits. Scattered small cerebral white matter T2 and FLAIR hyperintense foci in both hemispheres, mild to moderate for age and in a nonspecific pattern. No cortical encephalomalacia or chronic cerebral blood products identified. 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 Otherwise negative deep gray matter nuclei, brainstem and cerebellum. Vascular: Major intracranial vascular flow voids are preserved. Skull and upper cervical spine: Advanced cervical spine degeneration including spondylolisthesis at C3-C4. Evidence of spinal stenosis from C3 inferiorly (series 7, image 10). Normal background bone marrow signal. Other: Visible internal auditory structures appear normal. Mastoids are clear. Scalp and face soft tissues appear negative. MRI ORBITS FINDINGS Orbits: Normal suprasellar cistern. The optic chiasm appears normal. Unremarkable cavernous sinus. Symmetric appearing pre chiasmatic optic nerves. No optic nerve T2 hyperintensity or enhancement. Both globes appear normal. No intraorbital mass or inflammation. No abnormal intraorbital enhancement. Gaze does appear to be mildly Disconjugate on series 4, image 9. Visualized sinuses: Mild ethmoid sinus mucosal thickening more pronounced on the right. No complicating features. Soft tissues: Superficial and deep soft tissue spaces of the face appear symmetric and normal. IMPRESSION: 1. Symmetric and normal MRI appearance of the orbits. No explanation for right side monocular vision loss. Gaze does appear to be mildly disconjugate. 2.  No acute intracranial abnormality. Mild to moderate for age signal changes in the brain, including a small chronic lacune in the right caudate, compatible with small vessel  disease. 3. 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 Electronically Signed   By: Genevie Ann M.D.   On: 07/17/2019 19:41  PHYSICAL EXAM  Temp:  [98.1 F (36.7 C)-98.4 F (36.9 C)] 98.4 F (36.9 C) (11/18 1402) Pulse Rate:  [68-91] 83 (11/18 1402) Resp:  [16-20] 18 (11/18 1402) BP: (85-134)/(51-73) 85/66 (11/18 1402) SpO2:  [93 %-100 %] 93 % (11/18 1402)  General - Well nourished, well developed, in no apparent distress.  Ophthalmologic - fundi not visualized due to noncooperation.  Cardiovascular - Regular rhythm and rate.  Mental Status -  Level of arousal and orientation to time, place, and person were intact. Language including expression, naming, repetition, comprehension was assessed and found intact.  Cranial Nerves II - XII - II - Visual field intact OS. Right eye right upper quadrant visual field preserved, right lower and left upper quadrants decreased visual acuity but able to count fingers, left lower quadrant vision loss. III, IV, VI - Extraocular movements intact. V - Facial sensation intact bilaterally. VII - Facial movement intact bilaterally. VIII - Hearing & vestibular intact bilaterally. X - Palate elevates symmetrically. XI - Chin turning & shoulder shrug intact bilaterally. XII - Tongue protrusion intact.  Motor Strength - The patient's strength was normal in all extremities and pronator drift was absent.  Bulk was normal and fasciculations were absent.   Motor Tone - Muscle tone was assessed at the neck and appendages and was normal.  Reflexes - The patient's reflexes were symmetrical in all extremities and she had no pathological reflexes.  Sensory - Light touch, temperature/pinprick were assessed and were symmetrical.    Coordination - The patient had normal movements in the hands and feet with no ataxia or dysmetria.  Tremor was absent.  Gait and Station - deferred.   ASSESSMENT/PLAN Ms. Onetta Spainhower is a 68 y.o.  female with history of HTN, HLD, CHF, CKD, COPD, CAD, Pulmonary HTN presenting with painless R monocular nasal field vision loss 2 weeks ago.   R BRAO in setting of high grade R CCA/ICA stenosis  CT head No acute abnormality.  MRI orbits Unremarkable  MRI brain no acute abnormality. Small vessel disease. Old R caudate head lacune.   MRA head and neck Severe B ICA bifurcation atherosclerosis w/ moderate to severe proximal R>L stenosis. Probable brachiocephalic high grade stenosis. ICA siphon atherosclerosis. Probably R subclavian steal w/ loss of flow R VA.    CTA neck high grade (> 80%) stenosis proximal innominate. R VA patent. Plaque distal R CCA at ICA bifurcation w/ high grade stenosis. L CCA atherosclerosis w/ <50% stenosis. I ICA origin 30% stenosis.  Carotid Doppler b/l ICA 40-59% stenosis, right VA retorgrade flow and right subclavian A monophasic flow, suggestive of proximal obstruction  2D Echo pending  IR consulted and angiogram pending  LDL 73  HgbA1c 6.2  Lovenox 40 mg sq daily for VTE prophylaxis  intermittent aspirin 81 prior to admission, now on aspirin 325 mg daily and plavix 75. Continue DAPT for 3 months and then plavix alone.   Therapy recommendations:  pending   Disposition:  pending   Right subclavian steal syndrome  CTA showed right innominate artery high grade stenosis  MRA and CUS suggestive of right VA retrograde flow  Pt denies relevant symptoms  On DAPT  Cerebral angiogram pending  Hypertension  Stable . Permissive hypertension (OK if < 220/120) but gradually normalize in 5-7 days . Long-term BP goal 130-150 given multi-vessel occlusion  Hyperlipidemia  Home meds:  lipitor 10  Now on lipitor 40  LDL 73, goal < 70  Continue statin at discharge  Diabetes type  II Controlled  HgbA1c 6.2, goal < 7.0  CBGs  SSI  PCP follow up  Other Stroke Risk Factors  Advanced age  Former Cigarette smoker  Coronary artery  disease  Chronic diastolic Congestive heart failure w/ LV dysfunction  Other Active Problems  CKD  Hypokalemia  Hypothyroidism  GERD  COPD  Hospital day # 1  Rosalin Hawking, MD PhD Stroke Neurology 07/18/2019 3:38 PM    To contact Stroke Continuity provider, please refer to [email or web address] After hours, contact General Neurology

## 2019-07-18 NOTE — ED Notes (Signed)
Breakfast Ordered 

## 2019-07-18 NOTE — Progress Notes (Signed)
PROGRESS NOTE  Melissa Knox  DOB: April 11, 1951  PCP: Ronnell Freshwater, NP GQQ:[id number]  DOA: 07/17/2019  LOS: 1 day   Chief Complaint  Patient presents with   Stroke Symptoms   sent from dr's office   Brief narrative: Melissa Knox an 68 y.o.femalewith PMH of HTN, HLD, CAD, diastolic CHF, CKD, COPD, hypothyroidism. Patient presented to the ED on 11/17 with right monocular nasal field visual loss of acute onset 2 weeks ago.She states that she woke up with the symptoms. She could not see objects within the nasal half of the visual field of her right eye and also had impaired central vision being unable to read letters when looking directly at text. She could see normally within the temporal visual fields of her right eye. Her left eye was unaffected. No pain associated with the vision loss.The vision loss has not worsened or improved since it was first noticed 2 weeks ago.  She takes a baby ASA intermittently as an outpatient.  CT head did not show any acute abnormality. MRI brain also did not show any acute abnormality. MR angiography of head and neck showed  1. Severe bilateral carotid bifurcation atherosclerosis with moderate to severe proximal ICA stenosis worse in the right. 2.  Loss of flow signal in the brachiocephalic artery also suspicious for high-grade stenosis 3. ICA siphon atherosclerosis without significant stenosis. Patent ophthalmic artery origins, although suggestion of decreased ophthalmic artery flow on the right. 4.Appearance suspicious for Right Subclavian Steal Syndrome: loss of antegrade flow signal in the cervical right vertebral artery, although preserved flow void and flow signal in the posterior fossa Patient was admitted under hospitalist medicine service.  Neurology consultation was obtained.    Subjective: Patient was seen and examined this morning.  Pleasant elderly Caucasian female.  Lying down in bed.  Not in distress.  Vision impairment  seems to be clearing up.  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 -CT scan of head, MRI brain and MRA head and neck findings as above. -PT/OT consult pending. -HbA1c 6.2, fasting lipid panel showed HDL 52, LDL 73 -Echo pending. -Patient has been taking aspirin 81 mg intermittently.  Neurology recommended to switch to 325 mg daily.  Also on Plavix 75 mg daily. Increased Lipitor to 40 mg daily.  Severe intracranial vascular stenosis -MRI finding as above showing severe bilateral intracranial vascular stenosis. -Interventional radiology Dr. Clinton Sawyer has been consulted.  -Continue antiplatelets  Other cardiovascular issues: HTN, HLD, CAD, diastolic CHF, CKD,  -Home meds include amlodipine, Coreg, Lasix, losartan, Lipitor. -Resume amlodipine, Coreg.  Keep Lasix and losartan on hold.  Continue Lipitor. -Creatinine at baseline.  Hypokalemia -Potassium low at 2.9 this morning.  Oral replacement with 2 doses of 40 mg potassium given.  Hypothyroidism -continue Synthroid  GERD -continue Carafate, Protonix  COPD -stable, not on oxygen.  Continue bronchodilators  Mobility: PT eval pending, increase ambulation DVT prophylaxis:  Lovenox subcu Code Status:   Code Status: Full Code  Family Communication:  No one at bedside Expected Discharge:  Pending vascular intervention  Consultants: Neurology, neuro interventional surgery  Procedures:    Antimicrobials: Anti-infectives (From admission, onward)   None      Diet Order            Diet Heart Room service appropriate? Yes; Fluid consistency: Thin  Diet effective now  Infusions:   sodium chloride 75 mL/hr at 07/18/19 0623    Scheduled Meds:  aspirin  325 mg Oral Daily   atorvastatin  40 mg Oral q1800   clopidogrel  75 mg Oral Daily   enoxaparin (LOVENOX) injection  40 mg  Subcutaneous Q24H   fluticasone furoate-vilanterol  1 puff Inhalation Daily   levothyroxine  50 mcg Oral QAC breakfast   mirtazapine  7.5 mg Oral QHS   pantoprazole  40 mg Oral BID   sodium chloride flush  3 mL Intravenous Once   sucralfate  1 g Oral TID WC & HS    PRN meds: albuterol, docusate sodium, loratadine   Objective: Vitals:   07/18/19 0800 07/18/19 0900  BP: (!) 133/57 134/60  Pulse: 75 74  Resp: 17 18  Temp:    SpO2: 100% 100%    Intake/Output Summary (Last 24 hours) at 07/18/2019 1134 Last data filed at 07/17/2019 1655 Gross per 24 hour  Intake 100 ml  Output --  Net 100 ml   Filed Weights   07/17/19 1145  Weight: 62.6 kg   Weight change:  Body mass index is 26.95 kg/m.   Physical Exam: General exam: Appears calm and comfortable.  Skin: No rashes, lesions or ulcers. HEENT: Atraumatic, normocephalic, supple neck, no obvious bleeding Lungs: Clear to auscultation bilaterally CVS: Regular rate and rhythm, no murmur GI/Abd soft, nontender, nondistended, bowel sound present CNS: Alert, awake, oriented x3, right vision is less blurry now Psychiatry: Mood appropriate Extremities: No pedal edema no calf tenderness  Data Review: I have personally reviewed the laboratory data and studies available.  Recent Labs  Lab 07/17/19 1222 07/17/19 1635 07/18/19 0400  WBC 6.8  --  7.0  NEUTROABS 4.1  --   --   HGB 8.6* 10.2* 8.6*  HCT 29.2* 30.0* 28.9*  MCV 80.4  --  81.4  PLT 457*  --  459*   Recent Labs  Lab 07/17/19 1222 07/17/19 1520 07/17/19 1635 07/18/19 0400  NA 138  --  145 143  K 2.7*  --  3.1* 2.9*  CL 101  --  103 105  CO2 22  --   --  25  GLUCOSE 93  --  86 95  BUN 13  --  16 13  CREATININE 1.67*  --  1.50* 1.43*  CALCIUM 8.2*  --   --  8.4*  MG  --  1.3*  --   --     Terrilee Croak, MD  [facility] 07/18/2019

## 2019-07-18 NOTE — Evaluation (Addendum)
Physical Therapy Evaluation & Discharge Patient Details Name: Melissa Knox MRN: [id number] DOB: 01-06-1951 Today's Date: 07/18/2019   History of Present Illness  Pt is a 68 y.o. female admitted 07/17/19 with c/o worsening vision in R eye. Head CT/MRI without acute abnormalitiy. MRA with severe bilateral ICA bifurcation atherosclerosis with moderate to severe proximal  R>L stenosis. IR consulted and cerebral angiogram pending. PMH includes CHF, CKD, COPD (baseline 3L O2), HTN.    Clinical Impression  Patient evaluated by Physical Therapy with no further acute PT needs identified. PTA, pt independent and lives with husband and granddaughter. Today, pt moving well at supervision-level; SpO2 99% on 3L O2 Melissa Knox. Pt reports some improvement in vision since this morning. Reviewed stroke signs/symptoms (BE FAST) with pt and husband. All education has been completed and the patient has no further questions. Encouraged continued ambulation with supervision from family and/or nursing staff. Acute PT is signing off. Thank you for this referral.    Follow Up Recommendations No PT follow up;Supervision - Intermittent    Equipment Recommendations  None recommended by PT    Recommendations for Other Services       Precautions / Restrictions Precautions Precautions: Fall Restrictions Weight Bearing Restrictions: No      Mobility  Bed Mobility Overal bed mobility: Modified Independent             General bed mobility comments: HOB elevated  Transfers Overall transfer level: Needs assistance Equipment used: None Transfers: Sit to/from Stand Sit to Stand: Supervision            Ambulation/Gait Ambulation/Gait assistance: Supervision Gait Distance (Feet): 40 Feet Assistive device: None Gait Pattern/deviations: Step-through pattern;Decreased stride length Gait velocity: Decreased Gait velocity interpretation: 1.31 - 2.62 ft/sec, indicative of limited community ambulator General Gait  Details: Slow, steady gait in room without DME, 1x reaching to sink for added stability; declined hallway ambulation due to fatigue. SpO2 99% on 3L O2 [name]  Stairs            Wheelchair Mobility    Modified Rankin (Stroke Patients Only) Modified Rankin (Stroke Patients Only) Pre-Morbid Rankin Score: No symptoms Modified Rankin: Slight disability     Balance Overall balance assessment: Needs assistance   Sitting balance-Leahy Scale: Good       Standing balance-Leahy Scale: Good                               Pertinent Vitals/Pain Pain Assessment: No/denies pain    Home Living Family/patient expects to be discharged to:: Private residence Living Arrangements: Spouse/significant other;Other relatives Available Help at Discharge: Family;Available 24 hours/day Type of Home: House Home Access: Level entry     Home Layout: Two level;Bed/bath upstairs Home Equipment: Walker - 2 wheels;Walker - 4 wheels;Shower seat;Cane - single point;Other (comment) Additional Comments: Wears 3L O2 [name] baseline    Prior Function Level of Independence: Independent         Comments: No longer uses DME for ambulation. Lives with 25 y.o. granddaughter who she helps care for     Hand Dominance        Extremity/Trunk Assessment   Upper Extremity Assessment Upper Extremity Assessment: Generalized weakness    Lower Extremity Assessment Lower Extremity Assessment: Generalized weakness       Communication   Communication: No difficulties  Cognition Arousal/Alertness: Awake/alert Behavior During Therapy: WFL for tasks assessed/performed Overall Cognitive Status: Within Functional Limits for tasks assessed  General Comments General comments (skin integrity, edema, etc.): Husband present and supportive. Pt initially on RA with SpO2 82%, replaced 3L O2 (baseline) with SpO2 up to 99%    Exercises      Assessment/Plan    PT Assessment Patent does not need any further PT services  PT Problem List         PT Treatment Interventions      PT Goals (Current goals can be found in the Care Plan section)  Acute Rehab PT Goals PT Goal Formulation: All assessment and education complete, DC therapy    Frequency     Barriers to discharge        Co-evaluation               AM-PAC PT "6 Clicks" Mobility  Outcome Measure Help needed turning from your back to your side while in a flat bed without using bedrails?: None Help needed moving from lying on your back to sitting on the side of a flat bed without using bedrails?: None Help needed moving to and from a bed to a chair (including a wheelchair)?: None Help needed standing up from a chair using your arms (e.g., wheelchair or bedside chair)?: A Little Help needed to walk in hospital room?: A Little Help needed climbing 3-5 steps with a railing? : A Little 6 Click Score: 21    End of Session Equipment Utilized During Treatment: Oxygen Activity Tolerance: Patient tolerated treatment well Patient left: in bed;with call bell/phone within reach;with family/visitor present Nurse Communication: Mobility status PT Visit Diagnosis: Other abnormalities of gait and mobility (R26.89);Other symptoms and signs involving the nervous system (R29.898)    Time: 1500-1520 PT Time Calculation (min) (ACUTE ONLY): 20 min   Charges:   PT Evaluation $PT Eval Moderate Complexity: [place], PT, DPT Acute Rehabilitation Services  Pager [phone] Office [place] 07/18/2019, 5:37 PM

## 2019-07-18 NOTE — Progress Notes (Signed)
Carotid artery duplex completed. Refer to "CV Proc" under chart review to view preliminary results.  07/18/2019 1:41 PM Kelby Aline., MHA, RVT, RDCS, RDMS

## 2019-07-19 ENCOUNTER — Inpatient Hospital Stay (HOSPITAL_COMMUNITY): Payer: Medicare Other

## 2019-07-19 ENCOUNTER — Telehealth: Payer: Self-pay

## 2019-07-19 DIAGNOSIS — I2510 Atherosclerotic heart disease of native coronary artery without angina pectoris: Secondary | ICD-10-CM

## 2019-07-19 DIAGNOSIS — N183 Chronic kidney disease, stage 3 unspecified: Secondary | ICD-10-CM

## 2019-07-19 DIAGNOSIS — I6521 Occlusion and stenosis of right carotid artery: Secondary | ICD-10-CM

## 2019-07-19 HISTORY — PX: IR ARCH CERVICICEBRAL NON-SEL (MS): IMG5354

## 2019-07-19 LAB — BASIC METABOLIC PANEL
Anion gap: 8 (ref 5–15)
BUN: 9 mg/dL (ref 8–23)
CO2: 21 mmol/L — ABNORMAL LOW (ref 22–32)
Calcium: 8 mg/dL — ABNORMAL LOW (ref 8.9–10.3)
Chloride: 112 mmol/L — ABNORMAL HIGH (ref 98–111)
Creatinine, Ser: 1.48 mg/dL — ABNORMAL HIGH (ref 0.44–1.00)
GFR calc Af Amer: 42 mL/min — ABNORMAL LOW (ref >60)
GFR calc non Af Amer: 36 mL/min — ABNORMAL LOW (ref >60)
Glucose, Bld: 90 mg/dL (ref 70–99)
Potassium: 3.5 mmol/L (ref 3.5–5.1)
Sodium: 141 mmol/L (ref 135–145)

## 2019-07-19 LAB — CBC WITH DIFFERENTIAL/PLATELET
Abs Immature Granulocytes: 0.01 10*3/uL (ref 0.00–0.07)
Basophils Absolute: 0 10*3/uL (ref 0.0–0.1)
Basophils Relative: 1 %
Eosinophils Absolute: 0.3 10*3/uL (ref 0.0–0.5)
Eosinophils Relative: 5 %
HCT: 24.9 % — ABNORMAL LOW (ref 36.0–46.0)
Hemoglobin: 7.3 g/dL — ABNORMAL LOW (ref 12.0–15.0)
Immature Granulocytes: 0 %
Lymphocytes Relative: 30 %
Lymphs Abs: 2 10*3/uL (ref 0.7–4.0)
MCH: 23.9 pg — ABNORMAL LOW (ref 26.0–34.0)
MCHC: 29.3 g/dL — ABNORMAL LOW (ref 30.0–36.0)
MCV: 81.6 fL (ref 80.0–100.0)
Monocytes Absolute: 0.5 10*3/uL (ref 0.1–1.0)
Monocytes Relative: 8 %
Neutro Abs: 3.8 10*3/uL (ref 1.7–7.7)
Neutrophils Relative %: 56 %
Platelets: 414 10*3/uL — ABNORMAL HIGH (ref 150–400)
RBC: 3.05 MIL/uL — ABNORMAL LOW (ref 3.87–5.11)
RDW: 17.8 % — ABNORMAL HIGH (ref 11.5–15.5)
WBC: 6.7 10*3/uL (ref 4.0–10.5)
nRBC: 0 % (ref 0.0–0.2)

## 2019-07-19 LAB — GLUCOSE, CAPILLARY: Glucose-Capillary: 85 mg/dL (ref 70–99)

## 2019-07-19 MED ORDER — IOHEXOL 300 MG/ML  SOLN
150.0000 mL | Freq: Once | INTRAMUSCULAR | Status: AC | PRN
  Administered 2019-07-19: 20 mL via INTRA_ARTERIAL

## 2019-07-19 MED ORDER — SODIUM CHLORIDE 0.9 % IV BOLUS
INTRAVENOUS | Status: AC | PRN
  Administered 2019-07-19: 250 mL via INTRAVENOUS

## 2019-07-19 MED ORDER — FENTANYL CITRATE (PF) 100 MCG/2ML IJ SOLN
INTRAMUSCULAR | Status: AC
  Filled 2019-07-19: qty 2

## 2019-07-19 MED ORDER — FENTANYL CITRATE (PF) 100 MCG/2ML IJ SOLN
INTRAMUSCULAR | Status: AC | PRN
  Administered 2019-07-19: 25 ug via INTRAVENOUS

## 2019-07-19 MED ORDER — HEPARIN SODIUM (PORCINE) 1000 UNIT/ML IJ SOLN
INTRAMUSCULAR | Status: AC
  Filled 2019-07-19: qty 1

## 2019-07-19 MED ORDER — MIDAZOLAM HCL 2 MG/2ML IJ SOLN
INTRAMUSCULAR | Status: AC | PRN
  Administered 2019-07-19: 1 mg via INTRAVENOUS

## 2019-07-19 MED ORDER — LIDOCAINE HCL 1 % IJ SOLN
INTRAMUSCULAR | Status: AC
  Filled 2019-07-19: qty 20

## 2019-07-19 MED ORDER — SODIUM CHLORIDE 0.9 % IV SOLN: INTRAVENOUS | Status: AC

## 2019-07-19 MED ORDER — HEPARIN SODIUM (PORCINE) 1000 UNIT/ML IJ SOLN
INTRAMUSCULAR | Status: AC | PRN
  Administered 2019-07-19: 1000 [IU] via INTRAVENOUS

## 2019-07-19 MED ORDER — MIDAZOLAM HCL 2 MG/2ML IJ SOLN
INTRAMUSCULAR | Status: AC
  Filled 2019-07-19: qty 2

## 2019-07-19 MED ORDER — IOHEXOL 300 MG/ML  SOLN
100.0000 mL | Freq: Once | INTRAMUSCULAR | Status: AC | PRN
  Administered 2019-07-19: 80 mL via INTRA_ARTERIAL

## 2019-07-19 NOTE — Progress Notes (Signed)
PROGRESS NOTE  Melissa Knox  DOB: 01-23-1951  PCP: Ronnell Freshwater, NP OZH:[id number]  DOA: 07/17/2019  LOS: 2 days   Chief Complaint  Patient presents with  . Stroke Symptoms  . sent from dr's office   Brief narrative: Denim Start an 68 y.o.femalewith PMH of HTN, HLD, CAD, diastolic CHF, CKD, COPD, hypothyroidism. Patient presented to the ED on 11/17 with right monocular nasal field visual loss of acute onset 2 weeks ago.She states that she woke up with the symptoms. She could not see objects within the nasal half of the visual field of her right eye and also had impaired central vision being unable to read letters when looking directly at text. She could see normally within the temporal visual fields of her right eye. Her left eye was unaffected. No pain associated with the vision loss.The vision loss has not worsened or improved since it was first noticed 2 weeks ago.  She takes a baby ASA intermittently as an outpatient.  CT head did not show any acute abnormality. MRI brain also did not show any acute abnormality. MR angiography of head and neck showed  1. Severe bilateral carotid bifurcation atherosclerosis with moderate to severe proximal ICA stenosis worse in the right. 2.  Loss of flow signal in the brachiocephalic artery also suspicious for high-grade stenosis 3. ICA siphon atherosclerosis without significant stenosis. Patent ophthalmic artery origins, although suggestion of decreased ophthalmic artery flow on the right. 4.Appearance suspicious for Right Subclavian Steal Syndrome: loss of antegrade flow signal in the cervical right vertebral artery, although preserved flow void and flow signal in the posterior fossa Patient was admitted under hospitalist medicine service.  Neurology consultation was obtained.    Subjective: Patient was seen and examined this morning. Pleasant elderly African-American female, lying on bed. Not in distress. No fever last 24 hours.  Blood pressure stable. 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 -CT scan of head, MRI brain and MRA head and neck findings as above. -PT/OT consult appreciated. No further needs. -HbA1c 6.2, fasting lipid panel showed HDL 52, LDL 73 -Echo showed EF of 60 to 65%, without any cardiac source of embolization. -Patient has been taking aspirin 81 mg intermittently.  Neurology recommended to switch to 325 mg daily.  Also on Plavix 75 mg daily. Increased Lipitor to 40 mg daily.  Severe intracranial vascular stenosis -MRI finding as above showing severe bilateral intracranial vascular stenosis. -Interventional radiology Dr. Clinton Sawyer was consulted. Planned for arteriogram today -Continue antiplatelets. Lovenox held.  Other cardiovascular issues: HTN, HLD, CAD, diastolic CHF, CKD,  -Home meds include amlodipine, Coreg, Lasix, losartan, Lipitor. -Resume amlodipine, Coreg.  Keep Lasix and losartan on hold.  Continue Lipitor. -Creatinine at baseline.  Hypokalemia -Potassium low at 2.9 on 11/18.  Oral replacement with 2 doses of 40 mg potassium given. Potassium level improved to 3.5 today  Hypothyroidism -continue Synthroid  GERD -continue Carafate, Protonix  COPD -stable, not on oxygen.  Continue bronchodilators  Mobility: PT eval appreciated. No needs DVT prophylaxis:  Lovenox subcu  Code Status:   Code Status: Full Code  Family Communication:  No one at bedside Expected Discharge:  Pending vascular intervention  Consultants: Neurology, neuro interventional surgery  Procedures:    Antimicrobials: Anti-infectives (From admission, onward)   None      Diet Order  Diet NPO time specified  Diet effective midnight              Infusions:  . sodium chloride 75 mL/hr at 07/19/19 0500    Scheduled  Meds: . aspirin  325 mg Oral Daily  . atorvastatin  40 mg Oral q1800  . carvedilol  6.25 mg Oral BID  . clopidogrel  75 mg Oral Daily  . enoxaparin (LOVENOX) injection  40 mg Subcutaneous Q24H  . fluticasone furoate-vilanterol  1 puff Inhalation Daily  . levothyroxine  50 mcg Oral QAC breakfast  . mirtazapine  7.5 mg Oral QHS  . pantoprazole  40 mg Oral BID  . sodium chloride flush  3 mL Intravenous Once  . sucralfate  1 g Oral TID WC & HS    PRN meds: albuterol, docusate sodium, loratadine   Objective: Vitals:   07/19/19 0358 07/19/19 0740  BP: (!) 121/50   Pulse: 70   Resp: 16   Temp: 98.6 F (37 C)   SpO2: 99% 99%    Intake/Output Summary (Last 24 hours) at 07/19/2019 0844 Last data filed at 07/19/2019 0500 Gross per 24 hour  Intake 975.55 ml  Output -  Net 975.55 ml   Filed Weights   07/17/19 1145  Weight: 62.6 kg   Weight change:  Body mass index is 26.95 kg/m.   Physical Exam: General exam: Appears calm and comfortable.  Skin: No rashes, lesions or ulcers. HEENT: Atraumatic, normocephalic, supple neck, no obvious bleeding Lungs: Clear to auscultation bilaterally CVS: Regular rate and rhythm, no murmur GI/Abd soft, nontender, nondistended, bowel sound present CNS: Alert, awake, oriented x3, right vision is less blurry now Psychiatry: Mood appropriate Extremities: No pedal edema no calf tenderness  Data Review: I have personally reviewed the laboratory data and studies available.  Recent Labs  Lab 07/17/19 1222 07/17/19 1635 07/18/19 0400 07/19/19 0409  WBC 6.8  --  7.0 6.7  NEUTROABS 4.1  --   --  3.8  HGB 8.6* 10.2* 8.6* 7.3*  HCT 29.2* 30.0* 28.9* 24.9*  MCV 80.4  --  81.4 81.6  PLT 457*  --  459* 414*   Recent Labs  Lab 07/17/19 1222 07/17/19 1520 07/17/19 1635 07/18/19 0400 07/19/19 0409  NA 138  --  145 143 141  K 2.7*  --  3.1* 2.9* 3.5  CL 101  --  103 105 112*  CO2 22  --   --  25 21*  GLUCOSE 93  --  86 95 90  BUN 13  --   16 13 9   CREATININE 1.67*  --  1.50* 1.43* 1.48*  CALCIUM 8.2*  --   --  8.4* 8.0*  MG  --  1.3*  --   --   --     Terrilee Croak, MD  [facility] 07/19/2019

## 2019-07-19 NOTE — Consult Note (Addendum)
Hospital Consult    Reason for Consult: Right eye vision loss, high grade right ICA/innominate stenosis Referring Physician: Neurology/hospitlaist  MRN #:  [id number]  History of Present Illness: This is a 68 y.o. 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  Patient states she was in her normal state of health until 2 weeks ago when she lost vision in her right eye in the medial part of her vision field.  She ultimately went to the ED after consulting with an ophthalmologist that recommended urgent evaluation in the emergency department.  She denies any previous TIAs or strokes prior to this event.  She states she was on just aspirin at home and is now on dual antiplatelet with aspirin and Plavix.  She states she has no neurologic deficits at this time other than the vision loss in her right eye.  No previous head or neck surgery.  She has no symptoms in her right arm given right innominate stenosis.  She denies any right arm fatigue.  She denies any dizziness or posterior circulation symptoms.  States she has quit smoking at this time.  States she is ambulatory and functional at home.  Husband is at bedside.  Past Medical History:  Diagnosis Date  . Atherosclerotic heart disease 06/12/2015  . CHF (congestive heart failure) ([place])   . Chronic congestive heart failure with left ventricular diastolic dysfunction ([place]) 04/12/2018  . CKD (chronic kidney disease) stage 2, GFR 60-89 ml/min 06/12/2015  . COPD (chronic obstructive pulmonary disease) ([employer])   . Hypercalcemia 06/12/2015  . Hyperlipidemia 06/12/2015  . Hypertension 06/12/2015  . Hypothyroidism 06/12/2015  . Insomnia 06/12/2015  . Interstitial pneumonia ([place])  04/12/2018  . 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 Shortness of breath 06/12/2015  . Solitary pulmonary nodule 06/12/2015    Past Surgical History:  Procedure Laterality Date  . GALLBLADDER SURGERY  2012    No Known Allergies  Prior to Admission medications   Medication Sig Start Date End Date Taking? Authorizing Provider  albuterol (PROVENTIL) (2.5 MG/3ML) 0.083% nebulizer solution Take 2.5 mg by nebulization every 6 (six) hours as needed for wheezing or shortness of breath.   Yes [provider]  amLODipine (NORVASC) 10 MG tablet Take 1 tablet (10 mg total) by mouth daily. 02/15/19  Yes Boscia, Greer Ee, NP  atorvastatin (LIPITOR) 10 MG tablet Take 1 tablet (10 mg total) by mouth daily at 6 PM. 02/15/19  Yes Boscia, Greer Ee, NP  budesonide-formoterol (SYMBICORT) 160-4.5 MCG/ACT inhaler Inhale 2 puffs into the lungs 2 (two) times daily. 06/22/18  Yes Scarboro, Audie Clear, NP  carvedilol (COREG) 6.25 MG tablet Take 1 tablet (6.25 mg total) by mouth 2 (two) times daily. 07/17/18  Yes Boscia, Greer Ee, NP  docusate sodium (COLACE) 100 MG capsule Take 1 capsule (100 mg total) by mouth 2 (two) times daily as needed for mild constipation. 04/14/18  Yes Gouru, Illene Silver, MD  furosemide (LASIX) 20 MG tablet Take 1 tablet (20 mg total) by mouth daily. 09/14/18  Yes Scarboro, Audie Clear, NP  levothyroxine (SYNTHROID) 50 MCG tablet Take 1 tablet (50 mcg total) by mouth daily before breakfast. 06/06/19  Yes Boscia, Heather E, NP  loratadine (CLARITIN) 10 MG tablet TAKE 1 TABLET  BY MOUTH EVERY DAY FOR ALLERGIES AS NEEDED Patient taking differently: Take 10 mg by mouth daily as needed for allergies.  06/25/19  Yes Boscia, Greer Ee, NP  losartan (COZAAR) 100 MG tablet Take 100 mg by mouth daily.   Yes Lateef, Munsoor, MD  meclizine (ANTIVERT) 12.5 MG tablet Take 12.5 mg by mouth 3 (three) times daily as needed for dizziness.  06/02/16  Yes [provider]  mirtazapine (REMERON) 15 MG tablet  TAKE 1/2 OF A TABLET (7.5 MG TOTAL) BY MOUTH AT BEDTIME. Patient taking differently: Take 7.5 mg by mouth at bedtime.  01/31/19  Yes Boscia, Greer Ee, NP  OXYGEN Inhale 3 L into the lungs.   Yes [provider]  pantoprazole (PROTONIX) 40 MG tablet Take 1 tablet (40 mg total) by mouth 2 (two) times daily. 02/15/19  Yes Boscia, Heather E, NP  sucralfate (CARAFATE) 1 g tablet Take 1 tablet (1 g total) by mouth 4 (four) times daily -  with meals and at bedtime. 07/06/19  Yes Ronnell Freshwater, NP    Social History   Socioeconomic History  . Marital status: Married    Spouse name: Daryl  . Number of children: 3  . Years of education: 61  . Highest education level: 12th grade  Occupational History  . Not on file  Social Needs  . Financial resource strain: Not hard at all  . Food insecurity    Worry: Never true    Inability: Never true  . Transportation needs    Medical: No    Non-medical: No  Tobacco Use  . Smoking status: Former Smoker    Quit date: 2013    Years since quitting: 7.8  . Smokeless tobacco: Never Used  Substance and Sexual Activity  . Alcohol use: No    Alcohol/week: 0.0 standard drinks  . Drug use: No  . Sexual activity: Yes    Birth control/protection: None  Lifestyle  . Physical activity    Days per week: 2 days    Minutes per session: 30 min  . Stress: Not at all  Relationships  . Social Herbalist on phone: Twice a week    Gets together: Once a week    Attends religious service: 1 to 4 times per year    Active member of club or organization: No    Attends meetings of clubs or organizations: Never    Relationship status: Married  . Intimate partner violence    Fear of current or ex partner: No    Emotionally abused: No    Physically abused: No    Forced sexual activity: No  Other Topics Concern  . Not on file  Social History Narrative  . Not on file     Family History  Problem Relation Age of Onset  . Cancer Mother   .  Hypertension Father   . Diabetes Father   . Cancer Father     ROS: [x]  Positive   [ ]  Negative   [ ]  All sytems reviewed and are negative  Cardiovascular: []  chest pain/pressure []  palpitations []  SOB lying flat []  DOE []  pain in legs while walking []  pain in legs at rest []  pain in legs at night []  non-healing ulcers []  hx of DVT []  swelling in legs  Pulmonary: []  productive cough []  asthma/wheezing []  home O2  Neurologic: []  weakness in []  arms []  legs []  numbness in []  arms []  legs []  hx of CVA []  mini  stroke [] difficulty speaking or slurred speech [x]  temporary loss of vision in one eye (right eye) []  dizziness  Hematologic: []  hx of cancer []  bleeding problems []  problems with blood clotting easily  Endocrine:   []  diabetes []  thyroid disease  GI []  vomiting blood []  blood in stool  GU: []  CKD/renal failure []  HD--[]  M/W/F or []  T/T/S []  burning with urination []  blood in urine  Psychiatric: []  anxiety []  depression  Musculoskeletal: []  arthritis []  joint pain  Integumentary: []  rashes []  ulcers  Constitutional: []  fever []  chills   Physical Examination  Vitals:   07/19/19 1605 07/19/19 1620  BP: (!) 141/58 (!) 138/52  Pulse:    Resp: 19 12  Temp: 98 F (36.7 C) 97.7 F (36.5 C)  SpO2: 93% 92%   Body mass index is 26.95 kg/m.  General:  WDWN in NAD Gait: Not observed HENT: WNL, normocephalic Pulmonary: normal non-labored breathing, without Rales, rhonchi,  wheezing Cardiac: regular, without  Murmurs, rubs or gallops Abdomen: soft, NT/ND, no masses Vascular Exam/Pulses:  Right Left  Radial absent 2+ (normal)  Ulnar    Femoral Pressure dressing Weakly palpable  Popliteal    DP 1+ (weak) Good signal  PT     Extremities: without ischemic changes, without Gangrene , without cellulitis; without open wounds;  Musculoskeletal: no muscle wasting or atrophy  Neurologic: A&O X 3; Appropriate Affect ; SENSATION: normal; MOTOR  FUNCTION:  moving all extremities equally. Speech is fluent/normal   CBC    Component Value Date/Time   WBC 6.7 07/19/2019 0409   RBC 3.05 (L) 07/19/2019 0409   HGB 7.3 (L) 07/19/2019 0409   HGB 10.1 (L) 09/22/2018 1013   HCT 24.9 (L) 07/19/2019 0409   HCT 32.8 (L) 09/22/2018 1013   PLT 414 (H) 07/19/2019 0409   PLT 383 09/22/2018 1013   MCV 81.6 07/19/2019 0409   MCV 79 09/22/2018 1013   MCV 101 (H) 08/26/2012 0524   MCH 23.9 (L) 07/19/2019 0409   MCHC 29.3 (L) 07/19/2019 0409   RDW 17.8 (H) 07/19/2019 0409   RDW 18.7 (H) 09/22/2018 1013   RDW 13.8 08/26/2012 0524   LYMPHSABS 2.0 07/19/2019 0409   LYMPHSABS 2.0 09/22/2018 1013   LYMPHSABS 1.1 08/26/2012 0524   MONOABS 0.5 07/19/2019 0409   MONOABS 0.9 08/26/2012 0524   EOSABS 0.3 07/19/2019 0409   EOSABS 0.6 (H) 09/22/2018 1013   EOSABS 0.0 08/26/2012 0524   BASOSABS 0.0 07/19/2019 0409   BASOSABS 0.1 09/22/2018 1013   BASOSABS 0.0 08/26/2012 0524    BMET    Component Value Date/Time   NA 141 07/19/2019 0409   NA 140 09/22/2018 1013   NA 138 08/24/2012 1531   K 3.5 07/19/2019 0409   K 3.8 08/27/2012 0417   CL 112 (H) 07/19/2019 0409   CL 106 08/24/2012 1531   CO2 21 (L) 07/19/2019 0409   CO2 25 08/24/2012 1531   GLUCOSE 90 07/19/2019 0409   GLUCOSE 80 08/24/2012 1531   BUN 9 07/19/2019 0409   BUN 20 09/22/2018 1013   BUN 12 08/24/2012 1531   CREATININE 1.48 (H) 07/19/2019 0409   CREATININE 0.92 08/24/2012 1531   CALCIUM 8.0 (L) 07/19/2019 0409   CALCIUM 8.0 (L) 08/24/2012 1531   GFRNONAA 36 (L) 07/19/2019 0409   GFRNONAA >60 08/24/2012 1531   GFRAA 42 (L) 07/19/2019 0409   GFRAA >60 08/24/2012 1531    COAGS: Lab Results  Component Value Date   INR 1.1 07/17/2019  INR 1.22 02/06/2017     Non-Invasive Vascular Imaging:    MRI brain 07/17/19: no acute abnormality.  CTA neck from yesterday was personally reviewed by me.  She has a high-grade calcified atherosclerotic plaque in the distal common  carotid proximal ICA bifurcation.  Separately she has a high-grade heavily calcified plaque in the innominate origin as well.  I also reviewed Dr. Abram Sander angiogram pictures from today which confirms high-grade right ICA lesion and high-grade innominate lesion that is very calcified.   ASSESSMENT/PLAN: This is a 68 y.o. female with multiple medical problems as listed above that presents with right eye vision loss approximately 2 weeks ago in the setting of high-grade right ICA lesion and high-grade innominate lesion, both very calcified.  Suspect atheroembolic event from one of these lesions.  I discussed with the patient and her husband in detail about options moving forward.  Certainly could perform carotid endarterectomy with retrograde innominate stenting; however, the innominate lesion is heavily calcified and I have concern that a stent would be too high risk and probably would not completely deploy or open.  The alternative would be a aorto innominate bypass with mini sternotomy with graft originating off the ascending aorta and right carotid endarterectomy at the same time.  I have asked Dr. Kipp Brood with CT surgery to see the patient tomorrow.  Patient will need CTA chest to evaluate if the ascending aorta would be an option for graft implantation.  Partially imaged arch looks heavily calcified.  Certainly if she is deemed too high risk from CT surgery the other option would be right carotid endarterectomy and/or TCAR with medical management of her innominate lesion.  Vascular will follow.  Marty Heck, MD [facility] Office: [phone] Pager: [place]

## 2019-07-19 NOTE — Progress Notes (Signed)
Patient transferred to 1I45. Beside report given to Laverda Sorenson, Therapist, sports. Groin site check along with explanation of removal of quick clot in 24 hours explained, pulses checked, orders reviewed with receiving RN. This RN explained Subclavian steal syndrome to floor RN and how BP should only be taken in left arm because of inaccuracy with readings in R arm because of this.

## 2019-07-19 NOTE — Telephone Encounter (Signed)
Left message for patient to confirm and go over screening questions for 07-23-19 ov.

## 2019-07-19 NOTE — Evaluation (Signed)
Occupational Therapy Evaluation and Discharge Summary Patient Details Name: Melissa Knox MRN: [id number] DOB: 05/29/1951 Today's Date: 07/19/2019    History of Present Illness Pt is a 68 y.o. female admitted 07/17/19 with c/o worsening vision in R eye. Head CT/MRI without acute abnormalitiy. MRA with severe bilateral ICA bifurcation atherosclerosis with moderate to severe proximal  R>L stenosis. IR consulted and cerebral angiogram pending. PMH includes CHF, CKD, COPD (baseline 3L O2), HTN.   Clinical Impression   Pt admitted with the above diagnosis and has the deficits listed below. Pt is currently at baseline with all her basic adls despite this visual change she has reported.  Pt can cook, clean, dress, bathe etc.and states she is driving as well.  Instructed pt to talk to MD about driving and recommend follow up with eye specialist as recommended by d/c MD.  No further acute OT needs at this time.      Follow Up Recommendations  No OT follow up;Other (comment)(follow up with eye specialist as recommended by MD)    Equipment Recommendations  None recommended by OT    Recommendations for Other Services       Precautions / Restrictions Precautions Precautions: Fall Restrictions Weight Bearing Restrictions: No      Mobility Bed Mobility Overal bed mobility: Modified Independent             General bed mobility comments: HOB elevated  Transfers Overall transfer level: Modified independent Equipment used: None Transfers: Sit to/from Stand Sit to Stand: Modified independent (Device/Increase time)         General transfer comment: Pt with no physical assist needed.  First time seen for this pt.  Pt with IV so mod I.    Balance Overall balance assessment: No apparent balance deficits (not formally assessed)   Sitting balance-Leahy Scale: Good       Standing balance-Leahy Scale: Good                             ADL either performed or assessed with  clinical judgement   ADL Overall ADL's : At baseline                                       General ADL Comments: Pt fully independent with basic adls.      Vision Baseline Vision/History: (hs had this change in vision in r eye for last 3 weeks.) Patient Visual Report: Blurring of vision Vision Assessment?: Yes Eye Alignment: Within Functional Limits Ocular Range of Motion: Within Functional Limits Alignment/Gaze Preference: Within Defined Limits Tracking/Visual Pursuits: Able to track stimulus in all quads without difficulty Saccades: Within functional limits Convergence: Within functional limits Visual Fields: No apparent deficits Additional Comments: Pt did not overshoot or undershoot on eval but states she has been doing this at home.  States she has done both depending on lighting.     Perception Perception Perception Tested?: Yes   Praxis Praxis Praxis tested?: Within functional limits    Pertinent Vitals/Pain Pain Assessment: No/denies pain     Hand Dominance Right   Extremity/Trunk Assessment Upper Extremity Assessment Upper Extremity Assessment: Overall WFL for tasks assessed   Lower Extremity Assessment Lower Extremity Assessment: Defer to PT evaluation   Cervical / Trunk Assessment Cervical / Trunk Assessment: Normal   Communication Communication Communication: No difficulties   Cognition Arousal/Alertness: Awake/alert Behavior During  Therapy: WFL for tasks assessed/performed Overall Cognitive Status: Within Functional Limits for tasks assessed                                 General Comments: Pt with visual changes but so far feels it has not impacted her self care.  She is still driving with this visual change in the R eye.  Instructed pt to talk to her MD about this.   General Comments  Pt overall not affected by this vision deficit with basic adls. Pt states she can still safely cook, clean and take care of her  granddaughter.  Recommendations given to talk to MD about driving.     Exercises     Shoulder Instructions      Home Living Family/patient expects to be discharged to:: Private residence Living Arrangements: Spouse/significant other;Other relatives Available Help at Discharge: Family;Available 24 hours/day Type of Home: House Home Access: Level entry     Home Layout: Two level;Bed/bath upstairs Alternate Level Stairs-Number of Steps: Flight Alternate Level Stairs-Rails: Right Bathroom Shower/Tub: Teacher, early years/pre: Standard     Home Equipment: Environmental consultant - 2 wheels;Walker - 4 wheels;Shower seat;Cane - single point;Other (comment)   Additional Comments: Wears 3L O2 [place] baseline      Prior Functioning/Environment Level of Independence: Independent        Comments: No longer uses DME for ambulation. Lives with 35 y.o. granddaughter who she helps care for        OT Problem List:        OT Treatment/Interventions:      OT Goals(Current goals can be found in the care plan section) Acute Rehab OT Goals Patient Stated Goal: to get home OT Goal Formulation: All assessment and education complete, DC therapy  OT Frequency:     Barriers to D/C:            Co-evaluation              AM-PAC OT "6 Clicks" Daily Activity     Outcome Measure Help from another person eating meals?: None Help from another person taking care of personal grooming?: None Help from another person toileting, which includes using toliet, bedpan, or urinal?: None Help from another person bathing (including washing, rinsing, drying)?: None Help from another person to put on and taking off regular upper body clothing?: None Help from another person to put on and taking off regular lower body clothing?: None 6 Click Score: 24   End of Session Equipment Utilized During Treatment: Oxygen Nurse Communication: Mobility status  Activity Tolerance: Patient tolerated treatment  well Patient left: in bed  OT Visit Diagnosis: Other symptoms and signs involving the nervous system (R29.898)                Time: 4742-5956 OT Time Calculation (min): 11 min Charges:  OT General Charges $OT Visit: 1 Visit OT Evaluation $OT Eval Low Complexity: 1 Low  Glenford Peers 07/19/2019, 10:58 AM

## 2019-07-19 NOTE — Plan of Care (Signed)
  Problem: Education: Goal: Knowledge of patient specific risk factors addressed and post discharge goals established will improve Outcome: Progressing   

## 2019-07-19 NOTE — Procedures (Signed)
S/p Arch aortgram. RT CFA approach  Findings. 1. Severe RT ICA prox stenosis ,annd mod severe prox  Lt ICA stenosis. 2.Severe innominate artery stenosis. 3. RT subclavian steal via RT VA. . 3. Circle of Willis grossly patent . S.Tracia Lacomb MD

## 2019-07-19 NOTE — Progress Notes (Signed)
STROKE TEAM PROGRESS NOTE   INTERVAL HISTORY Pt lying in bed, her husband at bedside. No acute event overnight. No neuro changes. Still has right side visual disturbance.  Cerebral angiogram today with Dr. 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  High risk for endovascular treatment of right innominate artery, as well as difficult access to right CCA/ICA without addressing right innominate artery.  Will need a vascular surgery consultation.  Vitals:   07/19/19 0358 07/19/19 0740 07/19/19 0912 07/19/19 1213  BP: (!) 121/50  128/70 (!) 133/53  Pulse: 70  66 70  Resp: 16  17 16   Temp: 98.6 F (37 C)  98.9 F (37.2 C) 98.6 F (37 C)  TempSrc: Oral  Oral Oral  SpO2: 99% 99% 100% 100%  Weight:      Height:        CBC:  Recent Labs  Lab 07/17/19 1222  07/18/19 0400 07/19/19 0409  WBC 6.8  --  7.0 6.7  NEUTROABS 4.1  --   --  3.8  HGB 8.6*   < > 8.6* 7.3*  HCT 29.2*   < > 28.9* 24.9*  MCV 80.4  --  81.4 81.6  PLT 457*  --  459* 414*   < > = values in this interval not displayed.    Basic Metabolic Panel:  Recent Labs  Lab 07/17/19 1520  07/18/19 0400 07/19/19 0409  NA  --    < > 143 141  K  --    < > 2.9* 3.5  CL  --    < > 105 112*  CO2  --   --  25 21*  GLUCOSE  --    < > 95 90  BUN  --    < > 13 9  CREATININE  --    < > 1.43* 1.48*  CALCIUM  --   --  8.4* 8.0*  MG 1.3*  --   --   --    < > = values in this interval not displayed.   Lipid Panel:     Component Value Date/Time   CHOL 148 07/18/2019 0400   TRIG 113 07/18/2019 0400   HDL 52 07/18/2019 0400   CHOLHDL 2.8 07/18/2019 0400   VLDL 23 07/18/2019 0400   LDLCALC 73 07/18/2019 0400   HgbA1c:  Lab Results  Component Value Date   HGBA1C 6.2 (H) 07/18/2019   Urine Drug Screen: No results found for: LABOPIA, COCAINSCRNUR, LABBENZ, AMPHETMU, THCU, LABBARB  Alcohol Level No results found for: ETH  IMAGING Ct Angio Neck W Or Wo Contrast  Result Date:  07/18/2019 CLINICAL DATA:  Amaurosis fugax, EXAM: CT ANGIOGRAPHY NECK TECHNIQUE: Multidetector CT imaging of the neck was performed using the standard protocol during bolus administration of intravenous contrast. Multiplanar CT image reconstructions and MIPs were obtained to evaluate the vascular anatomy. Carotid stenosis measurements (when applicable) are obtained utilizing NASCET criteria, using the distal internal carotid diameter as the denominator. CONTRAST:  60mL OMNIPAQUE IOHEXOL 350 MG/ML SOLN COMPARISON:  MRA head/neck performed earlier the same day 07/18/2019 FINDINGS: Aortic arch: Standard branching. Prominent calcified plaque within the visualized aortic arch and proximal major branch vessels of the neck. Extensive calcified plaque within the proximal innominate artery with high-grade stenosis (suspected at least 80%). Right carotid system: Scattered calcified plaque within the proximal and mid right common carotid artery without significant stenosis (50% or greater). There is severe calcified atherosclerotic plaque within the distal common carotid artery, at  the bifurcation and within the proximal ICA. There is resultant high-grade stenosis of the distal common carotid artery and at the origin of the ICA, although exact quantification of stenosis is difficult due to blooming from prominent irregular calcified plaque. Additional calcified plaque more distally within the right carotid bulb, although without significant stenosis as compared to the more distal vessel. Scattered calcified plaque within the right internal carotid siphon without significant stenosis. Left carotid system: Calcified plaque at the origin of the left common carotid artery without stenosis (50% or greater). Additional scattered calcified plaque within the proximal and mid left common carotid artery without significant stenosis. Prominent calcified plaque within the distal common carotid artery with less than 50% stenosis.  Significant calcified plaque also present at the carotid bifurcation and within the proximal left ICA. Resultant 30% narrowing of the proximal left ICA as compared to more distal vessel. Scattered calcified plaque within the left internal carotid artery siphon without significant stenosis. Vertebral arteries: Dominant left vertebral artery. Calcified plaque at the origin of the non dominant right vertebral artery without significant ostial stenosis. The vertebral arteries are otherwise patent within the neck without significant stenosis (50% or greater). The intracranial vertebral arteries are patent without significant stenosis, as is the basilar artery. Skeleton: No acute bony abnormality. Cervical levocurvature. Cervical spondylosis greatest at C5-C6 where there is a small posterior disc osteophyte. 6 mm C3-C4 grade I/II anterolisthesis. Other neck: No soft tissue neck mass or pathologically enlarged cervical chain lymph nodes. 4 mm nodule within the right thyroid lobe. Upper chest: Redemonstrated prominent centrilobular and paraseptal emphysema within the imaged lung apices with associated right apical pleuroparenchymal scarring. IMPRESSION: 1. Prominent calcified plaque results in high-grade (likely greater than 80%) stenosis of the proximal innominate artery. The right vertebral artery is patent. However, please note there was suspicion for right subclavian steal phenomenon on MRA neck performed earlier the same day. 2. Prominent calcified plaque within the distal right common carotid artery, at the bifurcation and within the proximal ICA. Resultant high-grade stenosis of the distal right common carotid artery and at the origin of the right ICA. Exact quantification of stenosis is difficult due to blooming from prominent calcified plaque. Consider carotid artery duplex for further evaluation. 3. Prominent calcified plaque within the distal left common carotid artery, at the bifurcation and within the proximal  ICA. Less than 50% stenosis of the distal left CCA. Estimated 30% stenosis at the origin of the left ICA. 4. Bilateral vertebral arteries patent within the neck without significant stenosis (50% or greater). 5. Cervical spondylosis with 6 mm C3-C4 degenerative grade I/II anterolisthesis. Electronically Signed   By: Kellie Simmering DO   On: 07/18/2019 09:08   Mr Jodene Nam Head Wo Contrast  Result Date: 07/18/2019 CLINICAL DATA:  68 year old female with right monocular nasal field vision loss x2 weeks. Awoke with symptoms, without improvement since onset. 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 EXAM: MRA HEAD WITHOUT CONTRAST MRA NECK WITHOUT CONTRAST TECHNIQUE: Angiographic images of the Circle of Willis were obtained using MRA technique without intravenous contrast. Angiographic images of the neck were obtained using MRA technique without intravenous contrast. Carotid stenosis measurements (when applicable) are obtained utilizing NASCET criteria, using the distal internal carotid diameter as the denominator. COMPARISON:  Brain and orbit MRI earlier tonight. FINDINGS: MRA NECK FINDINGS Antegrade flow in the left vertebral artery from the thoracic inlet to the skull base, but no antegrade flow signal in the  right Vertebral despite a preserved distal right vertebral artery flow void earlier tonight. The cervical left vertebral artery appears normal. Evidence of a 3 vessel arch configuration. Flow signal gap at the brachiocephalic artery of unclear significance, but conspicuous compared to the preserved flow signal in the proximal left CCA and subclavian arteries. Antegrade flow in the bilateral cervical carotid arteries, although with severe right carotid bifurcation irregularity and up to high-grade stenosis of the right ICA origin (series 1003, image 15). Beyond the bulb flow signal in the cervical right ICA appears normal. Normal left CCA up to  the bifurcation. Moderate irregularity of the left carotid bifurcation and at least moderate stenosis of the proximal left ICA (series 1009, image 8). Distal to the bulb left ICA flow signal appears normal. MRA HEAD FINDINGS There is antegrade flow signal in both distal vertebral arteries on these images although the left appears dominant. No distal vertebral stenosis. Both PICA origins are patent. Patent vertebrobasilar junction and basilar artery without stenosis. Patent SCA and PCA origins. Tortuous left P1, possibly with a fetal type origin. Right posterior communicating artery is present. Bilateral PCA branches are within normal limits. Antegrade flow in both ICA siphons appears symmetric. Bilateral siphon irregularity in keeping with atherosclerosis, but no significant siphon stenosis. Patent and normal ophthalmic artery origins. Flow signal in the left ophthalmic artery does appear asymmetrically greater on series 5, image 94. Patent carotid termini. Normal MCA and ACA origins. Visible ACA branches are normal aside from mild tortuosity. Tortuous left MCA M1 segment with patent left MCA trifurcation, no stenosis. Right MCA M1 and right MCA bifurcation are patent without stenosis. Visible bilateral MCA branches are within normal limits. IMPRESSION: 1. 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 2. Loss of flow signal in the Brachiocephalic Artery also suspicious for high-grade stenosis. 3. ICA siphon atherosclerosis without significant stenosis. Patent ophthalmic artery origins, although suggestion of decreased ophthalmic artery flow on the right. 4. 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 Electronically Signed   By: Genevie Ann M.D.   On: 07/18/2019 03:32   Mr Angio Neck Wo  Contrast  Result Date: 07/18/2019 CLINICAL DATA:  68 year old female with right monocular nasal field vision loss x2 weeks. Awoke with symptoms, without improvement since onset. 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 EXAM: MRA HEAD WITHOUT CONTRAST MRA NECK WITHOUT CONTRAST TECHNIQUE: Angiographic images of the Circle of Willis were obtained using MRA technique without intravenous contrast. Angiographic images of the neck were obtained using MRA technique without intravenous contrast. Carotid stenosis measurements (when applicable) are obtained utilizing NASCET criteria, using the distal internal carotid diameter as the denominator. COMPARISON:  Brain and orbit MRI earlier tonight. FINDINGS: MRA NECK FINDINGS Antegrade flow in the left vertebral artery from the thoracic inlet to the skull base, but no antegrade flow signal in the right Vertebral despite a preserved distal right vertebral artery flow void earlier tonight. The cervical left vertebral artery appears normal. Evidence of a 3 vessel arch configuration. Flow signal gap at the brachiocephalic artery of unclear significance, but conspicuous compared to the preserved flow signal in the proximal left CCA and subclavian arteries. Antegrade flow in the bilateral cervical carotid arteries, although with severe right carotid bifurcation irregularity and up to high-grade stenosis of the  right ICA origin (series 1003, image 15). Beyond the bulb flow signal in the cervical right ICA appears normal. Normal left CCA up to the bifurcation. Moderate irregularity of the left carotid bifurcation and at least moderate stenosis of the proximal left ICA (series 1009, image 8). Distal to the bulb left ICA flow signal appears normal. MRA HEAD FINDINGS There is antegrade flow signal in both distal vertebral arteries on these images although the left appears dominant. No distal vertebral stenosis.  Both PICA origins are patent. Patent vertebrobasilar junction and basilar artery without stenosis. Patent SCA and PCA origins. Tortuous left P1, possibly with a fetal type origin. Right posterior communicating artery is present. Bilateral PCA branches are within normal limits. Antegrade flow in both ICA siphons appears symmetric. Bilateral siphon irregularity in keeping with atherosclerosis, but no significant siphon stenosis. Patent and normal ophthalmic artery origins. Flow signal in the left ophthalmic artery does appear asymmetrically greater on series 5, image 94. Patent carotid termini. Normal MCA and ACA origins. Visible ACA branches are normal aside from mild tortuosity. Tortuous left MCA M1 segment with patent left MCA trifurcation, no stenosis. Right MCA M1 and right MCA bifurcation are patent without stenosis. Visible bilateral MCA branches are within normal limits. IMPRESSION: 1. 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 2. Loss of flow signal in the Brachiocephalic Artery also suspicious for high-grade stenosis. 3. ICA siphon atherosclerosis without significant stenosis. Patent ophthalmic artery origins, although suggestion of decreased ophthalmic artery flow on the right. 4. 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 Electronically Signed   By: Genevie Ann M.D.   On: 07/18/2019 03:32   Mr Brain Wo Contrast  Result Date: 07/17/2019 CLINICAL DATA:  68 year old female with right monocular nasal field vision loss x2 weeks. Awoke with symptoms, which remain unchanged-without improvement since on set. EXAM: MRI HEAD WITHOUT CONTRAST MRI ORBITS WITHOUT AND WITH CONTRAST TECHNIQUE: Multiplanar, multiecho pulse sequences of the brain and surrounding structures were obtained without  and with intravenous contrast. Multiplanar, multiecho pulse sequences of the orbits and surrounding structures were obtained including fat saturation techniques, before and after intravenous contrast administration. CONTRAST:  87mL GADAVIST GADOBUTROL 1 MMOL/ML IV SOLN COMPARISON:  Head CT earlier today. FINDINGS: MRI HEAD FINDINGS Brain: No restricted diffusion to suggest acute infarction. No midline shift, mass effect, evidence of mass lesion, ventriculomegaly, extra-axial collection or acute intracranial hemorrhage. Cervicomedullary junction and pituitary are within normal limits. Scattered small cerebral white matter T2 and FLAIR hyperintense foci in both hemispheres, mild to moderate for age and in a nonspecific pattern. No cortical encephalomalacia or chronic cerebral blood products identified. 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 Otherwise negative deep gray matter nuclei, brainstem and cerebellum. Vascular: Major intracranial vascular flow voids are preserved. Skull and upper cervical spine: Advanced cervical spine degeneration including spondylolisthesis at C3-C4. Evidence of spinal stenosis from C3 inferiorly (series 7, image 10). Normal background bone marrow signal. Other: Visible internal auditory structures appear normal. Mastoids are clear. Scalp and face soft tissues appear negative. MRI ORBITS FINDINGS Orbits: Normal suprasellar cistern. The optic chiasm appears normal. Unremarkable cavernous sinus. Symmetric appearing pre chiasmatic optic nerves. No optic nerve T2 hyperintensity or enhancement. Both globes appear normal. No intraorbital mass or inflammation. No abnormal intraorbital enhancement. Gaze  does appear to be mildly Disconjugate on series 4, image 9. Visualized sinuses: Mild ethmoid sinus mucosal thickening more pronounced on the right. No complicating features. Soft tissues: Superficial and deep soft tissue spaces of the face appear symmetric  and normal. IMPRESSION: 1. Symmetric and normal MRI appearance of the orbits. No explanation for right side monocular vision loss. Gaze does appear to be mildly disconjugate. 2.  No acute intracranial abnormality. Mild to moderate for age signal changes in the brain, including a small chronic lacune in the right caudate, compatible with small vessel disease. 3. 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 Electronically Signed   By: Genevie Ann M.D.   On: 07/17/2019 19:41   Vas US Carotid  Result Date: 07/18/2019 Carotid Arterial Duplex Study Risk Factors: Hypertension, hyperlipidemia. Performing Technologist: Maudry Mayhew MHA, RDMS, RVT, RDCS  Examination Guidelines: A complete evaluation includes B-mode imaging, spectral Doppler, color Doppler, and power Doppler as needed of all accessible portions of each vessel. Bilateral testing is considered an integral part of a complete examination. Limited examinations for reoccurring indications may be performed as noted.  Right Carotid Findings: +----------+-------+-------+--------+---------------------------------+--------+           PSV    EDV    StenosisPlaque Description               Comments           cm/s   cm/s                                                     +----------+-------+-------+--------+---------------------------------+--------+ CCA Prox  63     13                                                       +----------+-------+-------+--------+---------------------------------+--------+ CCA Distal37     15                                                       +----------+-------+-------+--------+---------------------------------+--------+ ICA Prox  157    59             heterogenous, irregular and                                               calcific                                  +----------+-------+-------+--------+---------------------------------+--------+ ICA Distal27      10                                                       +----------+-------+-------+--------+---------------------------------+--------+ +----------+--------+-------+----------+-------------------+  PSV cm/sEDV cmsDescribe  Arm Pressure (mmHG) +----------+--------+-------+----------+-------------------+ Subclavian115            Monophasic                    +----------+--------+-------+----------+-------------------+ +---------+--------+---+--------+----------+ VertebralPSV cm/s-92EDV cm/sRetrograde +---------+--------+---+--------+----------+  Left Carotid Findings: +----------+-------+-------+--------+---------------------------------+--------+           PSV    EDV    StenosisPlaque Description               Comments           cm/s   cm/s                                                     +----------+-------+-------+--------+---------------------------------+--------+ CCA Prox  107    17                                                       +----------+-------+-------+--------+---------------------------------+--------+ CCA Distal89     17             smooth and heterogenous                   +----------+-------+-------+--------+---------------------------------+--------+ ICA Prox  195    43             heterogenous, calcific and                                                irregular                                 +----------+-------+-------+--------+---------------------------------+--------+ ICA Distal176    31                                                       +----------+-------+-------+--------+---------------------------------+--------+ ECA       191                                                             +----------+-------+-------+--------+---------------------------------+--------+ +----------+--------+--------+----------------+-------------------+           PSV cm/sEDV cm/sDescribe        Arm Pressure  (mmHG) +----------+--------+--------+----------------+-------------------+ [id number]             Multiphasic, WNL                    +----------+--------+--------+----------------+-------------------+ +---------+--------+---+--------+--+---------+ VertebralPSV cm/s234EDV cm/s16Antegrade +---------+--------+---+--------+--+---------+  Summary: Right Carotid: Velocities in the right ICA are consistent with a 40-59%                stenosis. Left Carotid: Velocities in the left ICA are consistent with a 40-59% stenosis.  Vertebrals:  Left vertebral artery demonstrates antegrade flow. Right vertebral              artery demonstrates retrograde flow, suggestive of proximal              obstruction. Subclavians: Normal flow hemodynamics were seen in the left subclavian artery.              Right subclavian artery exhibits monophasic flow, suggestive of              proximal obstruction. *See table(s) above for measurements and observations.     Preliminary    Mr Rosealee Albee Wo Contrast  Result Date: 07/17/2019 CLINICAL DATA:  68 year old female with right monocular nasal field vision loss x2 weeks. Awoke with symptoms, which remain unchanged-without improvement since on set. EXAM: MRI HEAD WITHOUT CONTRAST MRI ORBITS WITHOUT AND WITH CONTRAST TECHNIQUE: Multiplanar, multiecho pulse sequences of the brain and surrounding structures were obtained without and with intravenous contrast. Multiplanar, multiecho pulse sequences of the orbits and surrounding structures were obtained including fat saturation techniques, before and after intravenous contrast administration. CONTRAST:  34mL GADAVIST GADOBUTROL 1 MMOL/ML IV SOLN COMPARISON:  Head CT earlier today. FINDINGS: MRI HEAD FINDINGS Brain: No restricted diffusion to suggest acute infarction. No midline shift, mass effect, evidence of mass lesion, ventriculomegaly, extra-axial collection or acute intracranial hemorrhage. Cervicomedullary junction and  pituitary are within normal limits. Scattered small cerebral white matter T2 and FLAIR hyperintense foci in both hemispheres, mild to moderate for age and in a nonspecific pattern. No cortical encephalomalacia or chronic cerebral blood products identified. 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 Otherwise negative deep gray matter nuclei, brainstem and cerebellum. Vascular: Major intracranial vascular flow voids are preserved. Skull and upper cervical spine: Advanced cervical spine degeneration including spondylolisthesis at C3-C4. Evidence of spinal stenosis from C3 inferiorly (series 7, image 10). Normal background bone marrow signal. Other: Visible internal auditory structures appear normal. Mastoids are clear. Scalp and face soft tissues appear negative. MRI ORBITS FINDINGS Orbits: Normal suprasellar cistern. The optic chiasm appears normal. Unremarkable cavernous sinus. Symmetric appearing pre chiasmatic optic nerves. No optic nerve T2 hyperintensity or enhancement. Both globes appear normal. No intraorbital mass or inflammation. No abnormal intraorbital enhancement. Gaze does appear to be mildly Disconjugate on series 4, image 9. Visualized sinuses: Mild ethmoid sinus mucosal thickening more pronounced on the right. No complicating features. Soft tissues: Superficial and deep soft tissue spaces of the face appear symmetric and normal. IMPRESSION: 1. Symmetric and normal MRI appearance of the orbits. No explanation for right side monocular vision loss. Gaze does appear to be mildly disconjugate. 2.  No acute intracranial abnormality. Mild to moderate for age signal changes in the brain, including a small chronic lacune in the right caudate, compatible with small vessel disease. 3. 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 Electronically Signed   By: Genevie Ann M.D.   On: 07/17/2019 19:41    PHYSICAL EXAM    Temp:  [97.5 F (36.4 C)-98.9  F (37.2 C)] 98.6 F (37 C) (11/19 1213) Pulse Rate:  [66-108] 70 (11/19 1213) Resp:  [16-18] 16 (11/19 1213) BP: (80-133)/(50-74) 133/53 (11/19 1213) SpO2:  [92 %-100 %] 100 % (11/19 1213)  General - Well nourished, well developed, in no apparent distress.  Ophthalmologic - fundi not visualized due to noncooperation.  Cardiovascular - Regular rhythm and rate.  Mental Status -  Level of arousal  and orientation to time, place, and person were intact. Language including expression, naming, repetition, comprehension was assessed and found intact.  Cranial Nerves II - XII - II - Visual field intact OS. Right eye right upper quadrant visual field preserved, right lower and left upper quadrants decreased visual acuity but able to count fingers, left lower quadrant vision loss. III, IV, VI - Extraocular movements intact. V - Facial sensation intact bilaterally. VII - Facial movement intact bilaterally. VIII - Hearing & vestibular intact bilaterally. X - Palate elevates symmetrically. XI - Chin turning & shoulder shrug intact bilaterally. XII - Tongue protrusion intact.  Motor Strength - The patient's strength was normal in all extremities and pronator drift was absent.  Bulk was normal and fasciculations were absent.   Motor Tone - Muscle tone was assessed at the neck and appendages and was normal.  Reflexes - The patient's reflexes were symmetrical in all extremities and she had no pathological reflexes.  Sensory - Light touch, temperature/pinprick were assessed and were symmetrical.    Coordination - The patient had normal movements in the hands and feet with no ataxia or dysmetria.  Tremor was absent.  Gait and Station - deferred.   ASSESSMENT/PLAN [name] is a 68 y.o. female with history of HTN, HLD, CHF, CKD, COPD, CAD, Pulmonary HTN presenting with painless R monocular nasal field vision loss 2 weeks ago.   R BRAO in setting of high grade R CCA/ICA stenosis  CT  head No acute abnormality.  MRI orbits Unremarkable  MRI brain no acute abnormality. Small vessel disease. Old R caudate head lacune.   MRA head and neck Severe B ICA bifurcation atherosclerosis w/ moderate to severe proximal R>L stenosis. Probable brachiocephalic high grade stenosis. ICA siphon atherosclerosis. Probably R subclavian steal w/ loss of flow R VA.    CTA neck high grade (> 80%) stenosis proximal innominate. R VA patent. Plaque distal R CCA at ICA bifurcation w/ high grade stenosis. L CCA atherosclerosis w/ <50% stenosis. I ICA origin 30% stenosis.  Carotid Doppler b/l ICA 40-59% stenosis, right VA retorgrade flow and right subclavian A monophasic flow, suggestive of proximal obstruction  2D Echo EF 60-65%. No source of embolus   IR consulted and angiogram confirmed right innominate artery and CCA/ICA high-grade stenosis with calcified plaques  LDL 73  HgbA1c 6.2  Lovenox 40 mg sq daily for VTE prophylaxis  intermittent aspirin 81 prior to admission, now on aspirin 325 mg daily and plavix 75. Continue DAPT for 3 months and then plavix alone.   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 Permissive hypertension (OK if < 220/120) but gradually normalize in 5-7 days . Long-term BP goal 130-160 given multi-vessel  occlusion  Hyperlipidemia  Home meds:  lipitor 10  Now on lipitor 40  LDL 73, goal < 70  Continue statin at discharge  Diabetes type II Controlled  HgbA1c 6.2, goal <  7.0  CBGs  SSI  PCP follow up   Anemia of chronic disease  Hemoglobin 10.2->8.6->7.3   CBC in a.m.  May consider hold off aspirin Plavix if anemia continue getting worse  May consider PRBC transfusion if hemoglobin < 7.0  Other Stroke Risk Factors  Advanced age  Former Cigarette smoker  Coronary artery disease  Chronic diastolic Congestive heart failure w/ LV dysfunction  Other Active Problems  CKD  Hypokalemia  Hypothyroidism  GERD  COPD  Hospital day # 2  Rosalin Hawking, MD PhD Stroke Neurology 07/19/2019 1:05 PM    To contact Stroke Continuity provider, please refer to [email or web address] After hours, contact General Neurology

## 2019-07-20 ENCOUNTER — Encounter: Payer: Self-pay | Admitting: Gastroenterology

## 2019-07-20 ENCOUNTER — Inpatient Hospital Stay (HOSPITAL_COMMUNITY): Payer: Medicare Other

## 2019-07-20 DIAGNOSIS — I771 Stricture of artery: Secondary | ICD-10-CM

## 2019-07-20 LAB — CBC
HCT: 25.1 % — ABNORMAL LOW (ref 36.0–46.0)
Hemoglobin: 7.6 g/dL — ABNORMAL LOW (ref 12.0–15.0)
MCH: 24.1 pg — ABNORMAL LOW (ref 26.0–34.0)
MCHC: 30.3 g/dL (ref 30.0–36.0)
MCV: 79.4 fL — ABNORMAL LOW (ref 80.0–100.0)
Platelets: 407 K/uL — ABNORMAL HIGH (ref 150–400)
RBC: 3.16 MIL/uL — ABNORMAL LOW (ref 3.87–5.11)
RDW: 17.6 % — ABNORMAL HIGH (ref 11.5–15.5)
WBC: 8.6 K/uL (ref 4.0–10.5)
nRBC: 0 % (ref 0.0–0.2)

## 2019-07-20 MED ORDER — CLOPIDOGREL BISULFATE 75 MG PO TABS: 75.0000 mg | ORAL_TABLET | Freq: Every day | ORAL | 0 refills | Status: DC

## 2019-07-20 MED ORDER — SODIUM CHLORIDE 0.9 % IV SOLN
250.0000 mg | Freq: Once | INTRAVENOUS | Status: AC
  Administered 2019-07-20: 250 mg via INTRAVENOUS
  Filled 2019-07-20: qty 20

## 2019-07-20 MED ORDER — ASPIRIN 325 MG PO TABS: 325.0000 mg | ORAL_TABLET | Freq: Every day | ORAL | 0 refills | Status: DC

## 2019-07-20 MED ORDER — FERROUS SULFATE 325 (65 FE) MG PO TABS: 325.0000 mg | ORAL_TABLET | Freq: Two times a day (BID) | ORAL | 0 refills | Status: DC

## 2019-07-20 MED ORDER — FERROUS SULFATE 325 (65 FE) MG PO TABS
325.0000 mg | ORAL_TABLET | Freq: Two times a day (BID) | ORAL | Status: DC
  Administered 2019-07-20 – 2019-07-21 (×2): 325 mg via ORAL
  Filled 2019-07-20 (×2): qty 1

## 2019-07-20 NOTE — Care Management Important Message (Signed)
Important Message  Patient Details  Name: Melissa Knox MRN: [id number] Date of Birth: 1951/05/23   Medicare Important Message Given:  Yes     Melissa Knox 07/20/2019, 2:05 PM

## 2019-07-20 NOTE — Progress Notes (Signed)
[facility]  Subjective  -no new neurologic events.   Objective (!) 120/52 79 98.3 F (36.8 C) (Oral) 16 99%  Intake/Output Summary (Last 24 hours) at 07/20/2019 1130 Last data filed at 07/20/2019 0600 Gross per 24 hour  Intake 1916.74 ml  Output 800 ml  Net 1116.74 ml    No focal deficits on exam.  Sitting on the side of the bed.  Laboratory Lab Results: Recent Labs    07/19/19 0409 07/20/19 0254  WBC 6.7 8.6  HGB 7.3* 7.6*  HCT 24.9* 25.1*  PLT 414* 407*   BMET Recent Labs    07/18/19 0400 07/19/19 0409  NA 143 141  K 2.9* 3.5  CL 105 112*  CO2 25 21*  GLUCOSE 95 90  BUN 13 9  CREATININE 1.43* 1.48*  CALCIUM 8.4* 8.0*    COAG Lab Results  Component Value Date   INR 1.1 07/17/2019   INR 1.22 02/06/2017   No results found for: PTT  Assessment/Planning:  68 y.o. female with multiple medical problems that presented with right eye vision loss approximately 2 weeks ago in the setting of high-grade right ICA lesion and high-grade innominate lesion, both very calcified.  Suspect atheroembolic event from one of these lesions.  I discussed with the patient and her husband in detail last night about options moving forward.  Certainly could perform carotid endarterectomy with retrograde innominate stenting; however, the innominate lesion is heavily calcified and I have concern that a stent would be too high risk and probably would not completely deploy or open.  The alternative would be a aorto innominate bypass with mini sternotomy with graft originating off the ascending aorta and right carotid endarterectomy at the same time.  I have asked Dr. Kipp Brood with CT surgery to see the patient.  Patient will need CTA chest to evaluate if the ascending aorta would be an option for graft implantation.  Partially imaged arch looks heavily calcified.  Certainly if she is deemed too high risk from CT surgery the other option would be  right carotid endarterectomy and/or TCAR with medical management of her innominate lesion.    In discussing options again with her this morning she states she still needs to talk to her husband and think about options.  Also waiting for Dr. Kipp Brood to see her.  Whatever is performed will likely be the week after Thanksgiving, I am out next week.  Dr. Donzetta Matters will see her this weekend and see what she decides.     Marty Heck 07/20/2019 11:30 AM --

## 2019-07-20 NOTE — Progress Notes (Signed)
STROKE TEAM PROGRESS NOTE   INTERVAL HISTORY Husband at bedside. Pt is waiting decisions from [facility] Dr. Carlis Abbott and CT surgery Dr. Kipp Brood. She has no acute event and no complains.    Vitals:   07/19/19 2346 07/20/19 0336 07/20/19 0811 07/20/19 0908  BP: (!) 121/52 (!) 121/47  (!) 120/52  Pulse: 73 83  79  Resp: 18 17  16   Temp: 98.3 F (36.8 C) 98.6 F (37 C)  98.3 F (36.8 C)  TempSrc: Oral   Oral  SpO2: 98% 100% 98% 99%  Weight:      Height:        CBC:  Recent Labs  Lab 07/17/19 1222  07/19/19 0409 07/20/19 0254  WBC 6.8   < > 6.7 8.6  NEUTROABS 4.1  --  3.8  --   HGB 8.6*   < > 7.3* 7.6*  HCT 29.2*   < > 24.9* 25.1*  MCV 80.4   < > 81.6 79.4*  PLT 457*   < > 414* 407*   < > = values in this interval not displayed.    Basic Metabolic Panel:  Recent Labs  Lab 07/17/19 1520  07/18/19 0400 07/19/19 0409  NA  --    < > 143 141  K  --    < > 2.9* 3.5  CL  --    < > 105 112*  CO2  --   --  25 21*  GLUCOSE  --    < > 95 90  BUN  --    < > 13 9  CREATININE  --    < > 1.43* 1.48*  CALCIUM  --   --  8.4* 8.0*  MG 1.3*  --   --   --    < > = values in this interval not displayed.   Lipid Panel:     Component Value Date/Time   CHOL 148 07/18/2019 0400   TRIG 113 07/18/2019 0400   HDL 52 07/18/2019 0400   CHOLHDL 2.8 07/18/2019 0400   VLDL 23 07/18/2019 0400   LDLCALC 73 07/18/2019 0400   HgbA1c:  Lab Results  Component Value Date   HGBA1C 6.2 (H) 07/18/2019   Urine Drug Screen: No results found for: LABOPIA, COCAINSCRNUR, LABBENZ, AMPHETMU, THCU, LABBARB  Alcohol Level No results found for: [facility]  IMAGING Vas US Carotid  Result Date: 07/19/2019 Carotid Arterial Duplex Study Risk Factors: Hypertension, hyperlipidemia. Limitations   Today's exam was limited due to heavy calcification and the               resulting shadowing and the body habitus of the patient. Performing Technologist: Maudry Mayhew MHA, RDMS, RVT, RDCS  Examination Guidelines: A  complete evaluation includes B-mode imaging, spectral Doppler, color Doppler, and power Doppler as needed of all accessible portions of each vessel. Bilateral testing is considered an integral part of a complete examination. Limited examinations for reoccurring indications may be performed as noted.  Right Carotid Findings: +----------+-------+-------+--------+---------------------------------+--------+           PSV    EDV    StenosisPlaque Description               Comments           cm/s   cm/s                                                     +----------+-------+-------+--------+---------------------------------+--------+  CCA Prox  63     13                                                       +----------+-------+-------+--------+---------------------------------+--------+ CCA Distal37     15                                                       +----------+-------+-------+--------+---------------------------------+--------+ ICA Prox  157    59             heterogenous, irregular and                                               calcific                                  +----------+-------+-------+--------+---------------------------------+--------+ ICA Distal27     10                                                       +----------+-------+-------+--------+---------------------------------+--------+ +----------+--------+-------+----------+-------------------+           PSV cm/sEDV cmsDescribe  Arm Pressure (mmHG) +----------+--------+-------+----------+-------------------+ Subclavian115            Monophasic                    +----------+--------+-------+----------+-------------------+ +---------+--------+---+--------+----------+ VertebralPSV cm/s-92EDV cm/sRetrograde +---------+--------+---+--------+----------+  Left Carotid Findings: +----------+-------+-------+--------+---------------------------------+--------+           PSV    EDV     StenosisPlaque Description               Comments           cm/s   cm/s                                                     +----------+-------+-------+--------+---------------------------------+--------+ CCA Prox  107    17                                                       +----------+-------+-------+--------+---------------------------------+--------+ CCA Distal89     17             smooth and heterogenous                   +----------+-------+-------+--------+---------------------------------+--------+ ICA Prox  195    43             heterogenous, calcific and  irregular                                 +----------+-------+-------+--------+---------------------------------+--------+ ICA Distal176    31                                                       +----------+-------+-------+--------+---------------------------------+--------+ ECA       191                                                             +----------+-------+-------+--------+---------------------------------+--------+ +----------+--------+--------+----------------+-------------------+           PSV cm/sEDV cm/sDescribe        Arm Pressure (mmHG) +----------+--------+--------+----------------+-------------------+ [id number]             Multiphasic, WNL                    +----------+--------+--------+----------------+-------------------+ +---------+--------+---+--------+--+---------+ VertebralPSV cm/s234EDV cm/s16Antegrade +---------+--------+---+--------+--+---------+  Summary: Right Carotid: Velocities in the right ICA are consistent with a 40-59%                stenosis. Left Carotid: Velocities in the left ICA are consistent with a 40-59% stenosis. Vertebrals:  Left vertebral artery demonstrates antegrade flow. Right vertebral              artery demonstrates retrograde flow, suggestive of proximal               obstruction. Subclavians: Normal flow hemodynamics were seen in the left subclavian artery.              Right subclavian artery exhibits monophasic flow, suggestive of              proximal obstruction. *See table(s) above for measurements and observations.  Electronically signed by Antony Contras MD on 07/19/2019 at 6:07:13 PM.    Final     PHYSICAL EXAM  Temp:  [97.5 F (36.4 C)-98.6 F (37 C)] 98.3 F (36.8 C) (11/20 0908) Pulse Rate:  [67-103] 79 (11/20 0908) Resp:  [12-24] 16 (11/20 0908) BP: (109-153)/(47-74) 120/52 (11/20 0908) SpO2:  [91 %-100 %] 99 % (11/20 0908) FiO2 (%):  [28 %] 28 % (11/20 0811)  General - Well nourished, well developed, in no apparent distress.  Ophthalmologic - fundi not visualized due to noncooperation.  Cardiovascular - Regular rhythm and rate.  Mental Status -  Level of arousal and orientation to time, place, and person were intact. Language including expression, naming, repetition, comprehension was assessed and found intact.  Cranial Nerves II - XII - II - Visual field intact OS. Right eye right upper quadrant visual field preserved, right lower and left upper quadrants decreased visual acuity but able to count fingers, left lower quadrant vision loss. III, IV, VI - Extraocular movements intact. V - Facial sensation intact bilaterally. VII - Facial movement intact bilaterally. VIII - Hearing & vestibular intact bilaterally. X - Palate elevates symmetrically. XI - Chin turning & shoulder shrug intact bilaterally. XII - Tongue protrusion intact.  Motor Strength - The patient's strength was normal in all extremities and pronator drift  was absent.  Bulk was normal and fasciculations were absent.   Motor Tone - Muscle tone was assessed at the neck and appendages and was normal.  Reflexes - The patient's reflexes were symmetrical in all extremities and she had no pathological reflexes.  Sensory - Light touch, temperature/pinprick were assessed and  were symmetrical.    Coordination - The patient had normal movements in the hands and feet with no ataxia or dysmetria.  Tremor was absent.  Gait and Station - deferred.   ASSESSMENT/PLAN Ms. Eriyanna Kofoed is a 68 y.o. female with history of HTN, HLD, CHF, CKD, COPD, CAD, Pulmonary HTN presenting with painless R monocular nasal field vision loss 2 weeks ago.   R BRAO in setting of high grade R CCA/ICA stenosis  CT head No acute abnormality.  MRI orbits Unremarkable  MRI brain no acute abnormality. Small vessel disease. Old R caudate head lacune.   MRA head and neck Severe B ICA bifurcation atherosclerosis w/ moderate to severe proximal R>L stenosis. Probable brachiocephalic high grade stenosis. ICA siphon atherosclerosis. Probably R subclavian steal w/ loss of flow R VA.    CTA neck high grade (> 80%) stenosis proximal innominate. R VA patent. Plaque distal R CCA at ICA bifurcation w/ high grade stenosis. L CCA atherosclerosis w/ <50% stenosis. I ICA origin 30% stenosis.  Carotid Doppler b/l ICA 40-59% stenosis, right VA retorgrade flow and right subclavian A monophasic flow, suggestive of proximal obstruction  2D Echo EF 60-65%. No source of embolus   IR consulted and angiogram confirmed right innominate artery and CCA/ICA high-grade stenosis with calcified plaques  LDL 73  HgbA1c 6.2  Lovenox 40 mg sq daily for VTE prophylaxis  intermittent aspirin 81 prior to admission, now on aspirin 325 mg daily and plavix 75. Continue DAPT for 3 months and then plavix alone.   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  Deemed high risk with endovascular surgery only  [facility] consult Carlis Abbott) - surgical options discussed, pending further decision on surgical plan   CT Surgery [facility]) consulted as well  Pt and family considering surgical options   Right subclavian steal syndrome  CTA showed  right innominate artery high grade stenosis  MRA and CUS suggestive of right VA retrograde flow  Pt denies relevant symptoms  On DAPT  Cerebral angiogram confirmed right subclavian artery steal from right VA  VVS and CT surgery is considering options to fix innominate artery stenosis  Hypertension  Stable . Permissive hypertension (OK if < 220/120) but gradually normalize in 5-7 days . Long-term BP goal 130-160 given multi-vessel occlusion  Hyperlipidemia  Home meds:  lipitor 10  Now on lipitor 40  LDL 73, goal < 70  Continue statin at discharge  Diabetes type II Controlled  HgbA1c 6.2, goal < 7.0  CBGs  SSI  PCP follow up   Anemia of chronic disease Fe deficiency anemia  Hemoglobin 10.2->8.6->7.3->7.6    May consider hold off aspirin Plavix if anemia continue getting worse  May consider PRBC transfusion if hemoglobin < 7.0  For OP GI workup  Other Stroke Risk Factors  Advanced age  Former Cigarette smoker  Coronary artery disease  Chronic diastolic Congestive heart failure w/ LV dysfunction  Other Active Problems  CKD  Hypokalemia  Hypothyroidism  GERD  COPD  Hospital day # 3  Neurology will sign off. Please call with questions. Pt will follow up  with stroke clinic Dr. Leonie [facility] at [facility] in about 4 weeks. Thanks for the consult.   Rosalin Hawking, MD PhD Stroke Neurology 07/20/2019 12:23 PM    To contact Stroke Continuity provider, please refer to [email or web address] After hours, contact General Neurology

## 2019-07-20 NOTE — Progress Notes (Addendum)
PROGRESS NOTE  Melissa Knox  DOB: [birth year]  PCP: Ronnell Freshwater, NP STM:[id number]  DOA: 07/17/2019  LOS: 3 days   Chief Complaint  Patient presents with  . Stroke Symptoms  . sent from dr's office   Brief narrative: Champagne Paletta an 68 y.o.femalewith PMH of HTN, HLD, CAD, diastolic CHF, CKD, COPD, hypothyroidism. Patient presented to the ED on 11/17 with right monocular nasal field visual loss of acute onset 2 weeks ago.She states that she woke up with the symptoms. She could not see objects within the nasal half of the visual field of her right eye and also had impaired central vision being unable to read letters when looking directly at text. She could see normally within the temporal visual fields of her right eye. Her left eye was unaffected. No pain associated with the vision loss.The vision loss has not worsened or improved since it was first noticed 2 weeks ago.  She takes a baby ASA intermittently as an outpatient.  CT head did not show any acute abnormality. MRI brain also did not show any acute abnormality. MR angiography of head and neck showed  1. Severe bilateral carotid bifurcation atherosclerosis with moderate to severe proximal ICA stenosis worse in the right. 2.  Loss of flow signal in the brachiocephalic artery also suspicious for high-grade stenosis 3. ICA siphon atherosclerosis without significant stenosis. Patent ophthalmic artery origins, although suggestion of decreased ophthalmic artery flow on the right. 4.Appearance suspicious for Right Subclavian Steal Syndrome: loss of antegrade flow signal in the cervical right vertebral artery, although preserved flow void and flow signal in the posterior fossa Patient was admitted under hospitalist medicine service.  Neurology consultation was obtained.    Subjective: Patient was seen and examined this morning.  Not in distress.  Was seen by vascular surgery yesterday for right ICA stenosis.  Pending  evaluation by CVTS today.  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 -CT scan of head, MRI brain and MRA head and neck findings as above. -PT/OT consult appreciated. No further needs. -HbA1c 6.2, fasting lipid panel showed HDL 52, LDL 73 -Echo showed EF of 60 to 65%, without any cardiac source of embolization. -Patient has been taking aspirin 81 mg intermittently.  Neurology recommended to switch to 325 mg daily and also added Plavix 75 mg daily.  Patient will continue DAPT for 3 months and then Plavix alone. Increased Lipitor to 40 mg daily.  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 -Interventional radiology Dr. Estanislado Pandy data diagnostic CT angiogram on 11/19.   -Patient seen by vascular surgeon Dr. Carlis Abbott for consideration of right ICA CEA.  -CVTS Dr. Kipp Brood consulted for innominate artery stenosis.  Right subclavian steal syndrome -CTA showed right innominate artery high grade stenosis -MRA and CUS suggestive of right VA retrograde flow -Pt denies relevant symptoms -On DAPT  Other cardiovascular issues: HTN, HLD, CAD, diastolic CHF, GXQ1J,  -Home meds include amlodipine, Coreg, Lasix, losartan, Lipitor. -Resume amlodipine, Coreg.  Continue to hold Lasix and losartan.  Continue Lipitor. -Creatinine at baseline.  Hypokalemia -Potassium level improved with replacement.  Acute on chronic anemia Iron deficiency anemia GERD -Hemoglobin at baseline is between 9-10. -No active bleeding but hemoglobin this hospitalization keep down as low as 7.3 on 11/19.  Up at 7.6 today. -Discussed with patient history of anemia.  Patient states that she had required blood transfusion  once in the past.  She does not recall having any EGD or colonoscopy.  On chart review, I noted that, she had an endoscopic ultrasound done in 2018  for a pancreatic mass.  Patient then stated to me that she was told it turned out negative for cancer. -Ferritin level was low at 18 in January 2020.  I will give her 1 dose of IV iron today.  Also start on iron tablets. -Continue Carafate and Protonix. -I recommended her to get an evaluation by GI for possible need of EGD and colonoscopy.  Patient stated that she would like to think about it.  I ordered for referral to GI as an outpatient.  Hypothyroidism -continue Synthroid  COPD -stable, not on oxygen.  Continue bronchodilators  Mobility: PT eval appreciated. No needs DVT prophylaxis:  Lovenox subcu  Code Status:   Code Status: Full Code  Family Communication:  No one at bedside Expected Discharge:  Pending vascular intervention   Consultants: Neurology, neuro interventional radiology, vascular surgery, CVTS   Procedures:    Antimicrobials: Anti-infectives (From admission, onward)   None      Diet Order            Diet Heart Room service appropriate? Yes with Assist; Fluid consistency: Thin  Diet effective now              Infusions:  . sodium chloride 75 mL/hr at 07/20/19 0600    Scheduled Meds: . aspirin  325 mg Oral Daily  . atorvastatin  40 mg Oral q1800  . carvedilol  6.25 mg Oral BID  . clopidogrel  75 mg Oral Daily  . enoxaparin (LOVENOX) injection  40 mg Subcutaneous Q24H  . fluticasone furoate-vilanterol  1 puff Inhalation Daily  . levothyroxine  50 mcg Oral QAC breakfast  . mirtazapine  7.5 mg Oral QHS  . pantoprazole  40 mg Oral BID  . sodium chloride flush  3 mL Intravenous Once  . sucralfate  1 g Oral TID WC & HS    PRN meds: albuterol, docusate sodium, loratadine   Objective: Vitals:   07/20/19 0336 07/20/19 0811  BP: (!) 121/47   Pulse: 83   Resp: 17   Temp: 98.6 F (37 C)   SpO2: 100% 98%    Intake/Output Summary (Last 24 hours) at 07/20/2019 0901 Last data filed at 07/20/2019 0600 Gross per 24 hour  Intake 1916.74 ml  Output  800 ml  Net 1116.74 ml   Filed Weights   07/17/19 1145  Weight: 62.6 kg   Weight change:  Body mass index is 26.95 kg/m.   Physical Exam: General exam: Appears calm and comfortable.  Skin: No rashes, lesions or ulcers. HEENT: Atraumatic, normocephalic, supple neck, no obvious bleeding Lungs: Clear to auscultation bilaterally CVS: Regular rate and rhythm, no murmur GI/Abd soft, nontender, nondistended, bowel sound present CNS: Alert, awake, oriented x3, right vision is less blurry now Psychiatry: Mood appropriate Extremities: No pedal edema, no calf tenderness  Data Review: I have personally reviewed the laboratory data and studies available.  Recent Labs  Lab 07/17/19 1222 07/17/19 1635 07/18/19 0400 07/19/19 0409 07/20/19 0254  WBC 6.8  --  7.0 6.7 8.6  NEUTROABS 4.1  --   --  3.8  --   HGB 8.6* 10.2* 8.6* 7.3* 7.6*  HCT 29.2* 30.0* 28.9* 24.9* 25.1*  MCV 80.4  --  81.4 81.6 79.4*  PLT 457*  --  459* 414* 407*   Recent Labs  Lab 07/17/19  1222 07/17/19 1520 07/17/19 1635 07/18/19 0400 07/19/19 0409  NA 138  --  145 143 141  K 2.7*  --  3.1* 2.9* 3.5  CL 101  --  103 105 112*  CO2 22  --   --  25 21*  GLUCOSE 93  --  86 95 90  BUN 13  --  16 13 9   CREATININE 1.67*  --  1.50* 1.43* 1.48*  CALCIUM 8.2*  --   --  8.4* 8.0*  MG  --  1.3*  --   --   --     Terrilee Croak, MD  [facility] 07/20/2019

## 2019-07-20 NOTE — Progress Notes (Signed)
     [address]       [place] [address]             [phone]       68 year old female with multiple medical problems, is admitted following her strokelike symptoms.  She was noted to have high-grade stenosis in the right internal carotid artery as well as high-grade stenosis in the innominate artery.  Vascular surgery has asked me to evaluate the patient with the potential combination procedure that would require a mini sternotomy and an asending aorta to innominate artery bypass, followed by a right carotid endarterectomy.    On review of her imaging from 2019, her asending aorta did not appear to have much plaque, thus this procedure feasible.  High risk from a pulmonary standpoint.  She has severe COPD, with bullous emphysema, and her pulmonary function tests in January 2020 show a DLCO of 18%.  I discussed the risks and benefits of the procedure and she would like some time to discuss this with her husband.

## 2019-07-21 NOTE — Progress Notes (Signed)
   Patient has elected to forego sternotomy with treatment of innominate artery and will plan medical management of this lesion.  She will be scheduled as an outpatient for right carotid endarterectomy versus transcarotid artery stenting.  She should be discharged on aspirin and Plavix and a statin drug.  Appreciate Dr. Abran Duke input and will get her scheduled for Dr. Carlis Abbott as an outpatient.  Ermon Sagan C. Donzetta Matters, MD [facility] Office: [phone] Pager: [phone]

## 2019-07-21 NOTE — Discharge Summary (Signed)
Physician Discharge Summary  Melissa Knox AYT:[id number] DOB: 1950-12-13 DOA: 07/17/2019  PCP: Ronnell Freshwater, NP  Admit date: 07/17/2019 Discharge date: 07/21/2019  Admitted From: Home Discharge disposition: Home   Code Status: Full Code  Diet Recommendation: Cardiac diet  Recommendations for Outpatient Follow-Up:   1. Follow-up with vascular surgery as an outpatient 2. Follow-up with cardiothoracic surgery as an outpatient  Discharge Diagnosis:   Active Problems:   Acute CVA (cerebrovascular accident) [facility])  History of Present Illness / Brief narrative:  Melissa Woodsis an 68 y.o.femalewith PMH of HTN, HLD, CAD, diastolic CHF, CKD, COPD, hypothyroidism. Patient presented to the ED on 11/17 with right monocular nasal field visual loss of acute onset 2 weeks ago.She states that she woke up with the symptoms. She could not see objects within the nasal half of the visual field of her right eye and also had impaired central vision being unable to read letters when looking directly at text. She could see normally within the temporal visual fields of her right eye. Her left eye was unaffected. No pain associated with the vision loss.The vision loss has not worsened or improved since it was first noticed 2 weeks ago.  She takes a baby ASA intermittently as an outpatient.  CT head did not show any acute abnormality. MRI brain also did not show any acute abnormality. MR angiography of head and neck showed 1. Severe bilateral carotid bifurcation atherosclerosis with moderate to severe proximal ICA stenosis worse in the right. 2.Loss of flow signal in the brachiocephalic artery also suspicious for high-grade stenosis 3.ICA siphon atherosclerosis without significant stenosis. Patent ophthalmic artery origins, although suggestion of decreased ophthalmic artery flow on the right. 4.Appearance suspicious for Right Subclavian Steal Syndrome: loss of antegrade flow signal in the  cervical right vertebral artery, although preserved flow void and flow signal in the posterior fossa  Patient was admitted under hospitalist medicine service. Neurology consultation was obtained.   Hospital Course:  Right BRAO in the setting of high-grade right CCA/RCA stenosis. -CT scan of head, MRI brain and MRA head and neck findings as above. -PT/OT consult appreciated. No further needs. -HbA1c 6.2, fasting lipid panel showed HDL 52, LDL 73 -Echo showed EF of 60 to 65%, without any cardiac source of embolization. -Patient has been taking aspirin 81 mg intermittently. Neurology recommended to switch to 325 mg daily and also added Plavix 75 mg daily.  Patient will continue DAPT for 3 months and then Plavix alone. IncreasedLipitor to 40 mg daily.  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 -Interventional radiology Dr. Estanislado Pandy data diagnostic CT angiogram on 11/19.   -Patient seen by vascular surgeon Dr. Carlis Abbott for consideration of right ICA CEA.  -CVTS Dr. Kipp Brood consulted for innominate artery stenosis. -Patient with vascular surgeon as well as CVTS as an outpatient for further outpatient studies and surgical intervention.  Right subclavian steal syndrome -CTA showed right innominate artery high grade stenosis -MRA and CUS suggestive of right VA retrograde flow -Pt denies relevant symptoms -On DAPT  Other cardiovascular issues: HTN, HLD, CAD, diastolic CHF, TFT7D,  -Home meds include amlodipine, Coreg, Lasix, losartan, Lipitor. -Resume amlodipine, Coreg.  Resume Lasix and losartan post discharge.  Continue Lipitor. -Creatinine at baseline.  Hypokalemia -Potassium level improved with replacement.  Acute on chronic anemia Iron deficiency anemia GERD -Hemoglobin at baseline is between 9-10. -No active bleeding but hemoglobin this  hospitalization keep down as low as 7.3 on 11/19.  Up at 7.6 today. -Discussed with patient history of anemia.  Patient states that she had required blood transfusion once in the past.  She does not recall having any EGD or colonoscopy.  On chart review, I noted that, she had an endoscopic ultrasound done in 2018 for a pancreatic mass.  Patient then stated to me that she was told it turned out negative for cancer. -Ferritin level was low at 18 in January 2020.  I gave her 1 dose of IV iron today.  Also started on iron tablets. -Continue Carafate and Protonix. -I recommended her to get an evaluation by GI for possible need of EGD and colonoscopy.  Patient stated that she would like to think about it.  I ordered for referral to GI as an outpatient.  Hypothyroidism - continue Synthroid  COPD on 2 to 3 L oxygen at home. -Continue bronchodilators.  Stable for discharge to home today.  Subjective:  Patient was seen and examined this morning.  Pleasant elderly African-American female.  Propped up in bed.  On 3 L oxygen by nasal cannula at home rate.  Not in distress.  Vision impairment improved.  Seen by Dr. Kipp Brood yesterday.  Patient states she was seen by vascular surgery for follow-up this morning as well.  Discharge Exam:   Vitals:   07/21/19 0003 07/21/19 0346 07/21/19 0748 07/21/19 0821  BP: (!) 127/58 (!) 118/43 (!) 128/53   Pulse: 81 73 76   Resp: 18 18 20    Temp: 98.6 F (37 C) 98.5 F (36.9 C) 98.4 F (36.9 C)   TempSrc: Oral Oral Oral   SpO2: 98% 99% 94% 94%  Weight:      Height:        Body mass index is 26.95 kg/m.  General exam: Appears calm and comfortable.  Skin: No rashes, lesions or ulcers. HEENT: Atraumatic, normocephalic, supple neck, no obvious bleeding Lungs: Clear to auscultation bilaterally CVS: Regular rate and rhythm, no murmur GI/Abd soft, nontender, nondistended, bowel sound present CNS: Alert, awake, oriented x3, vision impairment  improved. Psychiatry: Mood appropriate Extremities: No pedal edema, no calf tenderness  Discharge Instructions:  Wound care: None Discharge Instructions    Ambulatory referral to Gastroenterology   Complete by: As directed    Ambulatory referral to Neurology   Complete by: As directed    Follow up with Dr. Leonie Man at [facility] in 4-6 weeks. Too complicated for NP to follow. Thanks.   Diet - low sodium heart healthy   Complete by: As directed    Increase activity slowly   Complete by: As directed      Follow-up Information    Garvin Fila, MD. Schedule an appointment as soon as possible for a visit in 4 week(s).   Specialties: Neurology, Radiology Contact information: [address] [place] [address] [phone]          Allergies as of 07/21/2019   No Known Allergies     Medication List    TAKE these medications   albuterol (2.5 MG/3ML) 0.083% nebulizer solution Commonly known as: PROVENTIL Take 2.5 mg by nebulization every 6 (six) hours as needed for wheezing or shortness of breath.   amLODipine 10 MG tablet Commonly known as: NORVASC Take 1 tablet (10 mg total) by mouth daily.   aspirin 325 MG tablet Take 1 tablet (325 mg total) by mouth daily.   atorvastatin 10 MG tablet Commonly known as: LIPITOR  Take 1 tablet (10 mg total) by mouth daily at 6 PM.   budesonide-formoterol 160-4.5 MCG/ACT inhaler Commonly known as: SYMBICORT Inhale 2 puffs into the lungs 2 (two) times daily.   carvedilol 6.25 MG tablet Commonly known as: COREG Take 1 tablet (6.25 mg total) by mouth 2 (two) times daily.   clopidogrel 75 MG tablet Commonly known as: PLAVIX Take 1 tablet (75 mg total) by mouth daily.   docusate sodium 100 MG capsule Commonly known as: COLACE Take 1 capsule (100 mg total) by mouth 2 (two) times daily as needed for mild constipation.   ferrous sulfate 325 (65 FE) MG tablet Take 1 tablet (325 mg total) by mouth 2 (two) times daily with a  meal.   furosemide 20 MG tablet Commonly known as: LASIX Take 1 tablet (20 mg total) by mouth daily.   levothyroxine 50 MCG tablet Commonly known as: SYNTHROID Take 1 tablet (50 mcg total) by mouth daily before breakfast.   loratadine 10 MG tablet Commonly known as: CLARITIN TAKE 1 TABLET BY MOUTH EVERY DAY FOR ALLERGIES AS NEEDED What changed:   how much to take  how to take this  when to take this  reasons to take this  additional instructions   losartan 100 MG tablet Commonly known as: COZAAR Take 100 mg by mouth daily.   meclizine 12.5 MG tablet Commonly known as: ANTIVERT Take 12.5 mg by mouth 3 (three) times daily as needed for dizziness.   mirtazapine 15 MG tablet Commonly known as: REMERON TAKE 1/2 OF A TABLET (7.5 MG TOTAL) BY MOUTH AT BEDTIME. What changed: See the new instructions.   OXYGEN Inhale 3 L into the lungs.   pantoprazole 40 MG tablet Commonly known as: Protonix Take 1 tablet (40 mg total) by mouth 2 (two) times daily.   sucralfate 1 g tablet Commonly known as: CARAFATE Take 1 tablet (1 g total) by mouth 4 (four) times daily -  with meals and at bedtime.       Time coordinating discharge: 35 minutes  The results of significant diagnostics from this hospitalization (including imaging, microbiology, ancillary and laboratory) are listed below for reference.    Procedures and Diagnostic Studies:   Ct Head Wo Contrast  Result Date: 07/17/2019 CLINICAL DATA:  Rule out ocular stroke. Unable to see at the right eye for 2 weeks EXAM: CT HEAD WITHOUT CONTRAST TECHNIQUE: Contiguous axial images were obtained from the base of the skull through the vertex without intravenous contrast. COMPARISON:  05/28/2017 FINDINGS: Brain: No evidence of acute infarction, hemorrhage, extra-axial collection, ventriculomegaly, or mass effect. Small right basal ganglia lacunar infarct. Generalized cerebral atrophy. Periventricular white matter low attenuation likely  secondary to microangiopathy. Vascular: Cerebrovascular atherosclerotic calcifications are noted. Skull: Negative for fracture or focal lesion. Sinuses/Orbits: Visualized portions of the orbits are unremarkable. Visualized portions of the paranasal sinuses are unremarkable. Visualized portions of the mastoid air cells are unremarkable. Other: None. IMPRESSION: No acute intracranial pathology. Electronically Signed   By: Kathreen Devoid   On: 07/17/2019 12:57   Ct Angio Neck W Or Wo Contrast  Result Date: 07/18/2019 CLINICAL DATA:  Amaurosis fugax, EXAM: CT ANGIOGRAPHY NECK TECHNIQUE: Multidetector CT imaging of the neck was performed using the standard protocol during bolus administration of intravenous contrast. Multiplanar CT image reconstructions and MIPs were obtained to evaluate the vascular anatomy. Carotid stenosis measurements (when applicable) are obtained utilizing NASCET criteria, using the distal internal carotid diameter as the denominator. CONTRAST:  15mL OMNIPAQUE IOHEXOL  350 MG/ML SOLN COMPARISON:  MRA head/neck performed earlier the same day 07/18/2019 FINDINGS: Aortic arch: Standard branching. Prominent calcified plaque within the visualized aortic arch and proximal major branch vessels of the neck. Extensive calcified plaque within the proximal innominate artery with high-grade stenosis (suspected at least 80%). Right carotid system: Scattered calcified plaque within the proximal and mid right common carotid artery without significant stenosis (50% or greater). There is severe calcified atherosclerotic plaque within the distal common carotid artery, at the bifurcation and within the proximal ICA. There is resultant high-grade stenosis of the distal common carotid artery and at the origin of the ICA, although exact quantification of stenosis is difficult due to blooming from prominent irregular calcified plaque. Additional calcified plaque more distally within the right carotid bulb, although  without significant stenosis as compared to the more distal vessel. Scattered calcified plaque within the right internal carotid siphon without significant stenosis. Left carotid system: Calcified plaque at the origin of the left common carotid artery without stenosis (50% or greater). Additional scattered calcified plaque within the proximal and mid left common carotid artery without significant stenosis. Prominent calcified plaque within the distal common carotid artery with less than 50% stenosis. Significant calcified plaque also present at the carotid bifurcation and within the proximal left ICA. Resultant 30% narrowing of the proximal left ICA as compared to more distal vessel. Scattered calcified plaque within the left internal carotid artery siphon without significant stenosis. Vertebral arteries: Dominant left vertebral artery. Calcified plaque at the origin of the non dominant right vertebral artery without significant ostial stenosis. The vertebral arteries are otherwise patent within the neck without significant stenosis (50% or greater). The intracranial vertebral arteries are patent without significant stenosis, as is the basilar artery. Skeleton: No acute bony abnormality. Cervical levocurvature. Cervical spondylosis greatest at C5-C6 where there is a small posterior disc osteophyte. 6 mm C3-C4 grade I/II anterolisthesis. Other neck: No soft tissue neck mass or pathologically enlarged cervical chain lymph nodes. 4 mm nodule within the right thyroid lobe. Upper chest: Redemonstrated prominent centrilobular and paraseptal emphysema within the imaged lung apices with associated right apical pleuroparenchymal scarring. IMPRESSION: 1. Prominent calcified plaque results in high-grade (likely greater than 80%) stenosis of the proximal innominate artery. The right vertebral artery is patent. However, please note there was suspicion for right subclavian steal phenomenon on MRA neck performed earlier the same  day. 2. Prominent calcified plaque within the distal right common carotid artery, at the bifurcation and within the proximal ICA. Resultant high-grade stenosis of the distal right common carotid artery and at the origin of the right ICA. Exact quantification of stenosis is difficult due to blooming from prominent calcified plaque. Consider carotid artery duplex for further evaluation. 3. Prominent calcified plaque within the distal left common carotid artery, at the bifurcation and within the proximal ICA. Less than 50% stenosis of the distal left CCA. Estimated 30% stenosis at the origin of the left ICA. 4. Bilateral vertebral arteries patent within the neck without significant stenosis (50% or greater). 5. Cervical spondylosis with 6 mm C3-C4 degenerative grade I/II anterolisthesis. Electronically Signed   By: Kellie Simmering DO   On: 07/18/2019 09:08   Mr Jodene Nam Head Wo Contrast  Result Date: 07/18/2019 CLINICAL DATA:  68 year old female with right monocular nasal field vision loss x2 weeks. Awoke with symptoms, without improvement since onset. 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 EXAM: MRA HEAD WITHOUT CONTRAST MRA NECK WITHOUT  CONTRAST TECHNIQUE: Angiographic images of the Circle of Willis were obtained using MRA technique without intravenous contrast. Angiographic images of the neck were obtained using MRA technique without intravenous contrast. Carotid stenosis measurements (when applicable) are obtained utilizing NASCET criteria, using the distal internal carotid diameter as the denominator. COMPARISON:  Brain and orbit MRI earlier tonight. FINDINGS: MRA NECK FINDINGS Antegrade flow in the left vertebral artery from the thoracic inlet to the skull base, but no antegrade flow signal in the right Vertebral despite a preserved distal right vertebral artery flow void earlier tonight. The cervical left vertebral artery appears normal.  Evidence of a 3 vessel arch configuration. Flow signal gap at the brachiocephalic artery of unclear significance, but conspicuous compared to the preserved flow signal in the proximal left CCA and subclavian arteries. Antegrade flow in the bilateral cervical carotid arteries, although with severe right carotid bifurcation irregularity and up to high-grade stenosis of the right ICA origin (series 1003, image 15). Beyond the bulb flow signal in the cervical right ICA appears normal. Normal left CCA up to the bifurcation. Moderate irregularity of the left carotid bifurcation and at least moderate stenosis of the proximal left ICA (series 1009, image 8). Distal to the bulb left ICA flow signal appears normal. MRA HEAD FINDINGS There is antegrade flow signal in both distal vertebral arteries on these images although the left appears dominant. No distal vertebral stenosis. Both PICA origins are patent. Patent vertebrobasilar junction and basilar artery without stenosis. Patent SCA and PCA origins. Tortuous left P1, possibly with a fetal type origin. Right posterior communicating artery is present. Bilateral PCA branches are within normal limits. Antegrade flow in both ICA siphons appears symmetric. Bilateral siphon irregularity in keeping with atherosclerosis, but no significant siphon stenosis. Patent and normal ophthalmic artery origins. Flow signal in the left ophthalmic artery does appear asymmetrically greater on series 5, image 94. Patent carotid termini. Normal MCA and ACA origins. Visible ACA branches are normal aside from mild tortuosity. Tortuous left MCA M1 segment with patent left MCA trifurcation, no stenosis. Right MCA M1 and right MCA bifurcation are patent without stenosis. Visible bilateral MCA branches are within normal limits. IMPRESSION: 1. 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 2. Loss of flow signal in the  Brachiocephalic Artery also suspicious for high-grade stenosis. 3. ICA siphon atherosclerosis without significant stenosis. Patent ophthalmic artery origins, although suggestion of decreased ophthalmic artery flow on the right. 4. 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 Electronically Signed   By: Genevie Ann M.D.   On: 07/18/2019 03:32   Mr Angio Neck Wo Contrast  Result Date: 07/18/2019 CLINICAL DATA:  68 year old female with right monocular nasal field vision loss x2 weeks. Awoke with symptoms, without improvement since onset. 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 EXAM: MRA HEAD WITHOUT CONTRAST MRA NECK WITHOUT CONTRAST TECHNIQUE: Angiographic images of the Circle of Willis were obtained using MRA technique without intravenous contrast. Angiographic images of the neck were obtained using MRA technique without intravenous contrast. Carotid stenosis measurements (when applicable) are obtained utilizing NASCET criteria, using the distal internal carotid diameter as the denominator. COMPARISON:  Brain and orbit MRI earlier tonight. FINDINGS: MRA NECK FINDINGS Antegrade flow in the left vertebral artery from the thoracic inlet to the skull  base, but no antegrade flow signal in the right Vertebral despite a preserved distal right vertebral artery flow void earlier tonight. The cervical left vertebral artery appears normal. Evidence of a 3 vessel arch configuration. Flow signal gap at the brachiocephalic artery of unclear significance, but conspicuous compared to the preserved flow signal in the proximal left CCA and subclavian arteries. Antegrade flow in the bilateral cervical carotid arteries, although with severe right carotid bifurcation irregularity and up to high-grade stenosis of  the right ICA origin (series 1003, image 15). Beyond the bulb flow signal in the cervical right ICA appears normal. Normal left CCA up to the bifurcation. Moderate irregularity of the left carotid bifurcation and at least moderate stenosis of the proximal left ICA (series 1009, image 8). Distal to the bulb left ICA flow signal appears normal. MRA HEAD FINDINGS There is antegrade flow signal in both distal vertebral arteries on these images although the left appears dominant. No distal vertebral stenosis. Both PICA origins are patent. Patent vertebrobasilar junction and basilar artery without stenosis. Patent SCA and PCA origins. Tortuous left P1, possibly with a fetal type origin. Right posterior communicating artery is present. Bilateral PCA branches are within normal limits. Antegrade flow in both ICA siphons appears symmetric. Bilateral siphon irregularity in keeping with atherosclerosis, but no significant siphon stenosis. Patent and normal ophthalmic artery origins. Flow signal in the left ophthalmic artery does appear asymmetrically greater on series 5, image 94. Patent carotid termini. Normal MCA and ACA origins. Visible ACA branches are normal aside from mild tortuosity. Tortuous left MCA M1 segment with patent left MCA trifurcation, no stenosis. Right MCA M1 and right MCA bifurcation are patent without stenosis. Visible bilateral MCA branches are within normal limits. IMPRESSION: 1. 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 2. Loss of flow signal in the Brachiocephalic Artery also suspicious for high-grade stenosis. 3. ICA siphon atherosclerosis without significant stenosis. Patent ophthalmic artery origins, although suggestion of decreased ophthalmic artery flow on the right. 4. Appearance suspicious for Right Subclavian Steal Syndrome: loss of antegrade flow signal in the cervical right vertebral artery, although preserved flow  void and flow signal in the posterior fossa. Electronically Signed   By: Genevie Ann M.D.   On: 07/18/2019 03:32   Mr Brain Wo Contrast  Result Date: 07/17/2019 CLINICAL DATA:  68 year old female with right monocular nasal field vision loss x2 weeks. Awoke with symptoms, which remain unchanged-without improvement since on set. EXAM: MRI HEAD WITHOUT CONTRAST MRI ORBITS WITHOUT AND WITH CONTRAST TECHNIQUE: Multiplanar, multiecho pulse sequences of the brain and surrounding structures were obtained without and with intravenous contrast. Multiplanar, multiecho pulse sequences of the orbits and surrounding structures were obtained including fat saturation techniques, before and after intravenous contrast administration. CONTRAST:  74mL GADAVIST GADOBUTROL 1 MMOL/ML IV SOLN COMPARISON:  Head CT earlier today. FINDINGS: MRI HEAD FINDINGS Brain: No restricted diffusion to suggest acute infarction. No midline shift, mass effect, evidence of mass lesion, ventriculomegaly, extra-axial collection or acute intracranial hemorrhage. Cervicomedullary junction and pituitary are within normal limits. Scattered small cerebral white matter T2 and FLAIR hyperintense foci in both hemispheres, mild to moderate for age and in a nonspecific pattern. No cortical encephalomalacia or chronic cerebral blood products identified. 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 Otherwise negative deep gray matter nuclei, brainstem and cerebellum. Vascular: Major intracranial vascular flow voids are preserved. Skull and upper cervical spine:  Advanced cervical spine degeneration including spondylolisthesis at C3-C4. Evidence of spinal stenosis from C3 inferiorly (series 7, image 10). Normal background bone marrow signal. Other: Visible internal auditory structures appear normal. Mastoids are clear. Scalp and face soft tissues appear negative. MRI ORBITS FINDINGS Orbits: Normal suprasellar cistern. The optic  chiasm appears normal. Unremarkable cavernous sinus. Symmetric appearing pre chiasmatic optic nerves. No optic nerve T2 hyperintensity or enhancement. Both globes appear normal. No intraorbital mass or inflammation. No abnormal intraorbital enhancement. Gaze does appear to be mildly Disconjugate on series 4, image 9. Visualized sinuses: Mild ethmoid sinus mucosal thickening more pronounced on the right. No complicating features. Soft tissues: Superficial and deep soft tissue spaces of the face appear symmetric and normal. IMPRESSION: 1. Symmetric and normal MRI appearance of the orbits. No explanation for right side monocular vision loss. Gaze does appear to be mildly disconjugate. 2.  No acute intracranial abnormality. Mild to moderate for age signal changes in the brain, including a small chronic lacune in the right caudate, compatible with small vessel disease. 3. 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 Electronically Signed   By: Genevie Ann M.D.   On: 07/17/2019 19:41   Vas US Carotid  Result Date: 07/19/2019 Carotid Arterial Duplex Study Risk Factors: Hypertension, hyperlipidemia. Limitations   Today's exam was limited due to heavy calcification and the               resulting shadowing and the body habitus of the patient. Performing Technologist: Maudry Mayhew MHA, RDMS, RVT, RDCS  Examination Guidelines: A complete evaluation includes B-mode imaging, spectral Doppler, color Doppler, and power Doppler as needed of all accessible portions of each vessel. Bilateral testing is considered an integral part of a complete examination. Limited examinations for reoccurring indications may be performed as noted.  Right Carotid Findings: +----------+-------+-------+--------+---------------------------------+--------+             PSV     EDV     Stenosis Plaque Description                Comments              cm/s    cm/s                                                          +----------+-------+-------+--------+---------------------------------+--------+  CCA Prox   63      13                                                           +----------+-------+-------+--------+---------------------------------+--------+  CCA Distal 37      15                                                           +----------+-------+-------+--------+---------------------------------+--------+  ICA Prox   157     59               heterogenous, irregular and  calcific                                    +----------+-------+-------+--------+---------------------------------+--------+  ICA Distal 27      10                                                           +----------+-------+-------+--------+---------------------------------+--------+ +----------+--------+-------+----------+-------------------+             PSV cm/s EDV cms Describe   Arm Pressure (mmHG)  +----------+--------+-------+----------+-------------------+  Subclavian 115              Monophasic                      +----------+--------+-------+----------+-------------------+ +---------+--------+---+--------+----------+  Vertebral PSV cm/s -92 EDV cm/s Retrograde  +---------+--------+---+--------+----------+  Left Carotid Findings: +----------+-------+-------+--------+---------------------------------+--------+             PSV     EDV     Stenosis Plaque Description                Comments              cm/s    cm/s                                                         +----------+-------+-------+--------+---------------------------------+--------+  CCA Prox   107     17                                                           +----------+-------+-------+--------+---------------------------------+--------+  CCA Distal 89      17               smooth and heterogenous                     +----------+-------+-------+--------+---------------------------------+--------+  ICA Prox   195     43                heterogenous, calcific and                                                       irregular                                   +----------+-------+-------+--------+---------------------------------+--------+  ICA Distal 176     31                                                           +----------+-------+-------+--------+---------------------------------+--------+  ECA        191                                                                  +----------+-------+-------+--------+---------------------------------+--------+ +----------+--------+--------+----------------+-------------------+             PSV cm/s EDV cm/s Describe         Arm Pressure (mmHG)  +----------+--------+--------+----------------+-------------------+  Subclavian 252               Multiphasic, WNL                      +----------+--------+--------+----------------+-------------------+ +---------+--------+---+--------+--+---------+  Vertebral PSV cm/s 234 EDV cm/s 16 Antegrade  +---------+--------+---+--------+--+---------+  Summary: Right Carotid: Velocities in the right ICA are consistent with a 40-59%                stenosis. Left Carotid: Velocities in the left ICA are consistent with a 40-59% stenosis. Vertebrals:  Left vertebral artery demonstrates antegrade flow. Right vertebral              artery demonstrates retrograde flow, suggestive of proximal              obstruction. Subclavians: Normal flow hemodynamics were seen in the left subclavian artery.              Right subclavian artery exhibits monophasic flow, suggestive of              proximal obstruction. *See table(s) above for measurements and observations.  Electronically signed by Antony Contras MD on 07/19/2019 at 72:07:13 PM.    Final    Mr Rosealee Albee Wo Contrast  Result Date: 07/17/2019 CLINICAL DATA:  68 year old female with right monocular nasal field vision loss x2 weeks. Awoke with symptoms, which remain unchanged-without improvement since on set. EXAM: MRI  HEAD WITHOUT CONTRAST MRI ORBITS WITHOUT AND WITH CONTRAST TECHNIQUE: Multiplanar, multiecho pulse sequences of the brain and surrounding structures were obtained without and with intravenous contrast. Multiplanar, multiecho pulse sequences of the orbits and surrounding structures were obtained including fat saturation techniques, before and after intravenous contrast administration. CONTRAST:  55mL GADAVIST GADOBUTROL 1 MMOL/ML IV SOLN COMPARISON:  Head CT earlier today. FINDINGS: MRI HEAD FINDINGS Brain: No restricted diffusion to suggest acute infarction. No midline shift, mass effect, evidence of mass lesion, ventriculomegaly, extra-axial collection or acute intracranial hemorrhage. Cervicomedullary junction and pituitary are within normal limits. Scattered small cerebral white matter T2 and FLAIR hyperintense foci in both hemispheres, mild to moderate for age and in a nonspecific pattern. No cortical encephalomalacia or chronic cerebral blood products identified. 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 Otherwise negative deep gray matter nuclei, brainstem and cerebellum. Vascular: Major intracranial vascular flow voids are preserved. Skull and upper cervical spine: Advanced cervical spine degeneration including spondylolisthesis at C3-C4. Evidence of spinal stenosis from C3 inferiorly (series 7, image 10). Normal background bone marrow signal. Other: Visible internal auditory structures appear normal. Mastoids are clear. Scalp and face soft tissues appear negative. MRI ORBITS FINDINGS Orbits: Normal suprasellar cistern. The optic chiasm appears normal. Unremarkable cavernous sinus. Symmetric appearing pre chiasmatic optic nerves. No optic nerve T2 hyperintensity or enhancement. Both globes appear normal. No intraorbital  mass or inflammation. No abnormal intraorbital enhancement. Gaze does appear to be mildly Disconjugate on series 4, image 9. Visualized sinuses: Mild  ethmoid sinus mucosal thickening more pronounced on the right. No complicating features. Soft tissues: Superficial and deep soft tissue spaces of the face appear symmetric and normal. IMPRESSION: 1. Symmetric and normal MRI appearance of the orbits. No explanation for right side monocular vision loss. Gaze does appear to be mildly disconjugate. 2.  No acute intracranial abnormality. Mild to moderate for age signal changes in the brain, including a small chronic lacune in the right caudate, compatible with small vessel disease. 3. 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 Electronically Signed   By: Genevie Ann M.D.   On: 07/17/2019 19:41     Labs:   Basic Metabolic Panel: Recent Labs  Lab 07/17/19 1222 07/17/19 1520 07/17/19 1635 07/18/19 0400 07/19/19 0409  NA 138  --  145 143 141  K 2.7*  --  3.1* 2.9* 3.5  CL 101  --  103 105 112*  CO2 22  --   --  25 21*  GLUCOSE 93  --  86 95 90  BUN 13  --  16 13 9   CREATININE 1.67*  --  1.50* 1.43* 1.48*  CALCIUM 8.2*  --   --  8.4* 8.0*  MG  --  1.3*  --   --   --    GFR Estimated Creatinine Clearance: 30 mL/min (A) (by C-G formula based on SCr of 1.48 mg/dL (H)). Liver Function Tests: Recent Labs  Lab 07/17/19 1222  AST 15  ALT 10  ALKPHOS 70  BILITOT 0.5  PROT 7.7  ALBUMIN 3.3*   No results for input(s): LIPASE, AMYLASE in the last 168 hours. No results for input(s): AMMONIA in the last 168 hours. Coagulation profile Recent Labs  Lab 07/17/19 1222  INR 1.1    CBC: Recent Labs  Lab 07/17/19 1222 07/17/19 1635 07/18/19 0400 07/19/19 0409 07/20/19 0254  WBC 6.8  --  7.0 6.7 8.6  NEUTROABS 4.1  --   --  3.8  --   HGB 8.6* 10.2* 8.6* 7.3* 7.6*  HCT 29.2* 30.0* 28.9* 24.9* 25.1*  MCV 80.4  --  81.4 81.6 79.4*  PLT 457*  --  459* 414* 407*   Cardiac Enzymes: No results for input(s): CKTOTAL, CKMB, CKMBINDEX, TROPONINI in the last 168 hours. BNP: Invalid input(s): POCBNP CBG: Recent Labs    Lab 07/17/19 1418 07/19/19 0616  GLUCAP 84 85   D-Dimer No results for input(s): DDIMER in the last 72 hours. Hgb A1c No results for input(s): HGBA1C in the last 72 hours. Lipid Profile No results for input(s): CHOL, HDL, LDLCALC, TRIG, CHOLHDL, LDLDIRECT in the last 72 hours. Thyroid function studies No results for input(s): TSH, T4TOTAL, T3FREE, THYROIDAB in the last 72 hours.  Invalid input(s): FREET3 Anemia work up No results for input(s): VITAMINB12, FOLATE, FERRITIN, TIBC, IRON, RETICCTPCT in the last 72 hours. Microbiology Recent Results (from the past 240 hour(s))  SARS CORONAVIRUS 2 (TAT 6-24 HRS) Nasopharyngeal Nasopharyngeal Swab     Status: None   Collection Time: 07/18/19  1:02 AM   Specimen: Nasopharyngeal Swab  Result Value Ref Range Status   SARS Coronavirus 2 NEGATIVE NEGATIVE Final    Comment: (NOTE) SARS-CoV-2 target nucleic acids are NOT DETECTED. The SARS-CoV-2 RNA is generally detectable in upper and lower respiratory specimens during the acute phase of infection. Negative results do not preclude SARS-CoV-2 infection, do  not rule out co-infections with other pathogens, and should not be used as the sole basis for treatment or other patient management decisions. Negative results must be combined with clinical observations, patient history, and epidemiological information. The expected result is Negative. Fact Sheet for Patients: SugarRoll.be Fact Sheet for Healthcare Providers: https://www.[employer]-mathews.com/ This test is not yet approved or cleared by the [place] FDA and  has been authorized for detection and/or diagnosis of SARS-CoV-2 by FDA under an Emergency Use Authorization (EUA). This EUA will remain  in effect (meaning this test can be used) for the duration of the COVID-19 declaration under Section 56 4(b)(1) of the Act, 21 U.S.C. section 360bbb-3(b)(1), unless the authorization is terminated  or revoked sooner. Performed at [facility], [place] [address], [place], [place] [address]     Please note: You were cared for by a hospitalist during your hospital stay. Once you are discharged, your primary care physician will handle any further medical issues. Please note that NO REFILLS for any discharge medications will be authorized once you are discharged, as it is imperative that you return to your primary care physician (or establish a relationship with a primary care physician if you do not have one) for your post hospital discharge needs so that they can reassess your need for medications and monitor your lab values.  Signed: Terrilee Croak  [facility] 07/21/2019, 9:24 AM

## 2019-07-21 NOTE — Plan of Care (Signed)
  Problem: Education: Goal: Knowledge of patient specific risk factors addressed and post discharge goals established will improve Outcome: Adequate for Discharge Goal: Individualized Educational Video(s) Outcome: Adequate for Discharge

## 2019-07-23 ENCOUNTER — Other Ambulatory Visit: Payer: Self-pay | Admitting: *Deleted

## 2019-07-23 ENCOUNTER — Ambulatory Visit: Payer: Self-pay | Admitting: Nurse Practitioner

## 2019-07-24 ENCOUNTER — Encounter (HOSPITAL_COMMUNITY): Payer: Self-pay | Admitting: Interventional Radiology

## 2019-07-30 ENCOUNTER — Encounter
Admission: RE | Admit: 2019-07-30 | Discharge: 2019-07-30 | Disposition: A | Discharge status: Home / Self Care | Payer: Medicare Other | Source: Ambulatory Visit | Attending: Vascular Surgery | Admitting: Vascular Surgery

## 2019-07-30 ENCOUNTER — Other Ambulatory Visit: Payer: Self-pay

## 2019-07-30 ENCOUNTER — Encounter (HOSPITAL_COMMUNITY): Payer: Self-pay | Admitting: *Deleted

## 2019-07-30 DIAGNOSIS — Z01812 Encounter for preprocedural laboratory examination: Secondary | ICD-10-CM | POA: Insufficient documentation

## 2019-07-30 DIAGNOSIS — Z20828 Contact with and (suspected) exposure to other viral communicable diseases: Secondary | ICD-10-CM | POA: Diagnosis not present

## 2019-07-30 LAB — SARS CORONAVIRUS 2 (TAT 6-24 HRS): SARS Coronavirus 2: NEGATIVE

## 2019-07-30 NOTE — Progress Notes (Signed)
Pt stated that she uses supplemental oxygen at night and PRN. Pt stated that she is under the care of Leretha Pol, NP, PCP and [facility], Cardiology. Pt denies any acute pulmonary issues. Pt denies chest pain. Pt denies having a cardiac cath. Pt stated that her last dose of Aspirin and Plavix was Friday. Nurse asked pt " who advised you to stop taking Aspirin and Plavix?  I have a note from the surgeon that you are to continue both medications. " Zigmund Daniel, Surgical Coordinator clarified that pt is to continue taking Aspirin, Plavix and Lipitor and that all three medications must be taken the morning of surgery. Nurse made pt aware to restart Aspirin and Plavix, pt took medication while speaking with nurse. Pt made aware to stop taking vitamins, fish oil and herbal medications. Do not take any NSAIDs ie: Ibuprofen, Advil, Naproxen (Aleve), Motrin, BC and Goody Powder. Pt verbalized understanding of all pre-op instructions. Pt chart forwarded to PA, Anesthesiology,  for review.

## 2019-07-31 NOTE — Progress Notes (Signed)
Anesthesia Chart Review: Same day workup  Pertinent hx includes HFpEF, CKD, COPD. Recently admitted for CVA. Patient presented to the ED on 11/17 with right monocular nasal field visual loss of acute onset 2 weeks prior. Found to have RightBRAOin the setting of high-grade rightCCA/RCAstenosis. Echo showed EF of 60 to 65%, without any cardiac source of embolization. 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 The pt has elected to forego sternotomy with treatment of innominate stenosis but will proceed with right CEA.  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 She was last seen 05/14/19. She is on supplemental O2 2-3L continuous.   Hx of CKD, creatinine 1.48 on BMP 07/19/19. Chronic anemia, Hgb 7.6 on 07/20/19. Baseline appears to be ~7.5-8.  PFTs 05/15/19 (care everywhere): FVC %Pre Pred 111 %  FEV1 %Pre Pred 105 %  FEV1/FVC %Pre Pred 94 %  FEF25-75% %Pre Pred 81 %  DLCO %Pre Pred 18 %    CT Chest 07/20/19: IMPRESSION: 1. Prominent calcified plaque at the origin of the brachiocephalic artery. Evaluation for the degree of stenosis is limited on this noncontrast exam. 2. 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 3. Advanced bullous centrilobular and paraseptal emphysema with chronic consolidation in the posterior aspect of the right upper lobe. 4. Chronic consolidation of the posterior aspect of the right upper lobe is slightly more confluent compared to prior study. Underlying neoplasm is not excluded. Consider PET-CT for further evaluation.  CTA neck 07/18/19: IMPRESSION: 1. Prominent calcified plaque results in high-grade  (likely greater than 80%) stenosis of the proximal innominate artery. The right vertebral artery is patent. However, please note there was suspicion for right subclavian steal phenomenon on MRA neck performed earlier the same day. 2. Prominent calcified plaque within the distal right common carotid artery, at the bifurcation and within the proximal ICA. Resultant high-grade stenosis of the distal right common carotid artery and at the origin of the right ICA. Exact quantification of stenosis is difficult due to blooming from prominent calcified plaque. Consider carotid artery duplex for further evaluation. 3. Prominent calcified plaque within the distal left common carotid artery, at the bifurcation and within the proximal ICA. Less than 50% stenosis of the distal left CCA. Estimated 30% stenosis at the origin of the left ICA. 4. Bilateral vertebral arteries patent within the neck without significant stenosis (50% or greater). 5. Cervical spondylosis with 6 mm C3-C4 degenerative grade I/II anterolisthesis.  TTE 07/18/19:  1. Left ventricular ejection fraction, by visual estimation, is 60 to 65%. The left ventricle has normal function. There is mildly increased left ventricular hypertrophy.  2. Left ventricular diastolic parameters are consistent with Grade I diastolic dysfunction (impaired relaxation).  3. The left ventricle has no regional wall motion abnormalities.  4. Global right ventricle has normal systolic function.The right ventricular size is normal. No increase in right ventricular wall thickness.  5. Left atrial size was normal.  6. Right atrial size was normal.  7. Trivial pericardial effusion is present.  8. The mitral valve is normal in structure. Trace mitral valve regurgitation. No evidence of mitral stenosis.  9. The tricuspid valve is normal in structure. Tricuspid valve regurgitation is trivial. 10.  The aortic valve is tricuspid. Aortic valve regurgitation is not  visualized. Mild aortic valve sclerosis without stenosis. 11. The tricuspid regurgitant velocity is 2.43 m/s, and with an assumed right atrial pressure of 3 mmHg, the estimated right ventricular systolic pressure is normal at 26.6 mmHg. 12. The inferior vena cava is normal in size with greater than 50% respiratory variability, suggesting right atrial pressure of 3 mmHg.

## 2019-07-31 NOTE — Anesthesia Preprocedure Evaluation (Addendum)
Anesthesia Evaluation  Patient identified by MRN, date of birth, ID band Patient awake    Reviewed: Allergy & Precautions, NPO status , Patient's Chart, lab work & pertinent test results  Airway Mallampati: II  TM Distance: >3 FB     Dental  (+) Edentulous Upper, Edentulous Lower   Pulmonary former smoker,     + decreased breath sounds      Cardiovascular hypertension,  Rhythm:Regular     Neuro/Psych    GI/Hepatic   Endo/Other    Renal/GU      Musculoskeletal   Abdominal   Peds  Hematology   Anesthesia Other Findings   Reproductive/Obstetrics                            Anesthesia Physical Anesthesia Plan  ASA: III  Anesthesia Plan: General   Post-op Pain Management:    Induction: Intravenous  PONV Risk Score and Plan: Ondansetron and Dexamethasone  Airway Management Planned: Oral ETT  Additional Equipment: Arterial line  Intra-op Plan:   Post-operative Plan: Extubation in OR  Informed Consent: I have reviewed the patients History and Physical, chart, labs and discussed the procedure including the risks, benefits and alternatives for the proposed anesthesia with the patient or authorized representative who has indicated his/her understanding and acceptance.       Plan Discussed with: CRNA and Anesthesiologist  Anesthesia Plan Comments: (Pertinent hx includes HFpEF, CKD, COPD. Recently admitted for CVA. Patient presented to the ED on 11/17 with right monocular nasal field visual loss of acute onset 2 weeks prior. Found to have RightBRAOin the setting of high-grade rightCCA/RCAstenosis. Echo showed EF of 60 to 65%, without any cardiac source of embolization. 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 The pt has elected to forego sternotomy with treatment of innominate stenosis but will proceed with right  CEA.  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 She was last seen 05/14/19. She is on supplemental O2 2-3L continuous.   Hx of CKD, creatinine 1.48 on BMP 07/19/19. Chronic anemia, Hgb 7.6 on 07/20/19. Baseline appears to be ~7.5-8.  PFT 05/15/19 (care everywhere): FVC %Pre Pred 111 % FEV1 %Pre Pred 105 % FEV1/FVC %Pre Pred 94 % FEF25-75% %Pre Pred 81 % DLCO %Pre Pred 18 %   CT Chest 07/20/19: IMPRESSION: 1. Prominent calcified plaque at the origin of the brachiocephalic artery. Evaluation for the degree of stenosis is limited on this noncontrast exam. 2. 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 3. Advanced bullous centrilobular and paraseptal emphysema with chronic consolidation in the posterior aspect of the right upper lobe. 4. Chronic consolidation of the posterior aspect of the right upper lobe is slightly more confluent compared to prior study. Underlying neoplasm is not excluded. Consider PET-CT for further evaluation.  CTA neck 07/18/19: IMPRESSION: 1. Prominent calcified plaque results in high-grade (likely greater than 80%) stenosis of the proximal innominate artery. The right vertebral artery is patent. However, please note there was suspicion for right subclavian steal phenomenon on MRA neck performed earlier the same day. 2. Prominent calcified plaque within the distal right common carotid artery, at the bifurcation and within  the proximal ICA. Resultant high-grade stenosis of the distal right common carotid artery and at the origin of the right ICA. Exact quantification of stenosis is difficult due to blooming from prominent calcified plaque. Consider carotid artery  duplex for further evaluation. 3. Prominent calcified plaque within the distal left common carotid artery, at the bifurcation and within the proximal ICA. Less than 50% stenosis of the distal left CCA. Estimated 30% stenosis at the origin of the left ICA. 4. Bilateral vertebral arteries patent within the neck without significant stenosis (50% or greater). 5. Cervical spondylosis with 6 mm C3-C4 degenerative grade I/II anterolisthesis.  TTE 07/18/19:  1. Left ventricular ejection fraction, by visual estimation, is 60 to 65%. The left ventricle has normal function. There is mildly increased left ventricular hypertrophy.  2. Left ventricular diastolic parameters are consistent with Grade I diastolic dysfunction (impaired relaxation).  3. The left ventricle has no regional wall motion abnormalities.  4. Global right ventricle has normal systolic function.The right ventricular size is normal. No increase in right ventricular wall thickness.  5. Left atrial size was normal.  6. Right atrial size was normal.  7. Trivial pericardial effusion is present.  8. The mitral valve is normal in structure. Trace mitral valve regurgitation. No evidence of mitral stenosis.  9. The tricuspid valve is normal in structure. Tricuspid valve regurgitation is trivial. 10. The aortic valve is tricuspid. Aortic valve regurgitation is not visualized. Mild aortic valve sclerosis without stenosis. 11. The tricuspid regurgitant velocity is 2.43 m/s, and with an assumed right atrial pressure of 3 mmHg, the estimated right ventricular systolic pressure is normal at 26.6 mmHg. 12. The inferior vena cava is normal in size with greater than 50% respiratory variability, suggesting right atrial pressure of 3 mmHg.)       Anesthesia Quick Evaluation

## 2019-08-01 ENCOUNTER — Inpatient Hospital Stay (HOSPITAL_COMMUNITY)
Admission: RE | Admit: 2019-08-01 | Discharge: 2019-08-02 | DRG: 038 | Disposition: A | Discharge status: Home / Self Care | Payer: Medicare Other | Attending: Vascular Surgery | Admitting: Vascular Surgery

## 2019-08-01 ENCOUNTER — Other Ambulatory Visit: Payer: Self-pay

## 2019-08-01 ENCOUNTER — Inpatient Hospital Stay (HOSPITAL_COMMUNITY): Payer: Medicare Other | Admitting: Physician Assistant

## 2019-08-01 ENCOUNTER — Encounter (HOSPITAL_COMMUNITY)
Admission: RE | Disposition: A | Discharge status: Home / Self Care | Payer: Self-pay | Source: Home / Self Care | Attending: Vascular Surgery

## 2019-08-01 ENCOUNTER — Encounter (HOSPITAL_COMMUNITY): Payer: Self-pay | Admitting: *Deleted

## 2019-08-01 DIAGNOSIS — J449 Chronic obstructive pulmonary disease, unspecified: Secondary | ICD-10-CM | POA: Diagnosis not present

## 2019-08-01 DIAGNOSIS — E785 Hyperlipidemia, unspecified: Secondary | ICD-10-CM | POA: Diagnosis present

## 2019-08-01 DIAGNOSIS — I2510 Atherosclerotic heart disease of native coronary artery without angina pectoris: Secondary | ICD-10-CM | POA: Diagnosis present

## 2019-08-01 DIAGNOSIS — Z79890 Hormone replacement therapy (postmenopausal): Secondary | ICD-10-CM | POA: Diagnosis not present

## 2019-08-01 DIAGNOSIS — Z7951 Long term (current) use of inhaled steroids: Secondary | ICD-10-CM

## 2019-08-01 DIAGNOSIS — Z833 Family history of diabetes mellitus: Secondary | ICD-10-CM | POA: Diagnosis not present

## 2019-08-01 DIAGNOSIS — I130 Hypertensive heart and chronic kidney disease with heart failure and stage 1 through stage 4 chronic kidney disease, or unspecified chronic kidney disease: Secondary | ICD-10-CM | POA: Diagnosis present

## 2019-08-01 DIAGNOSIS — J9611 Chronic respiratory failure with hypoxia: Secondary | ICD-10-CM | POA: Diagnosis not present

## 2019-08-01 DIAGNOSIS — N182 Chronic kidney disease, stage 2 (mild): Secondary | ICD-10-CM | POA: Diagnosis present

## 2019-08-01 DIAGNOSIS — I6521 Occlusion and stenosis of right carotid artery: Secondary | ICD-10-CM | POA: Diagnosis not present

## 2019-08-01 DIAGNOSIS — Z9981 Dependence on supplemental oxygen: Secondary | ICD-10-CM | POA: Diagnosis not present

## 2019-08-01 DIAGNOSIS — E039 Hypothyroidism, unspecified: Secondary | ICD-10-CM | POA: Diagnosis present

## 2019-08-01 DIAGNOSIS — I509 Heart failure, unspecified: Secondary | ICD-10-CM | POA: Diagnosis not present

## 2019-08-01 DIAGNOSIS — Z8673 Personal history of transient ischemic attack (TIA), and cerebral infarction without residual deficits: Secondary | ICD-10-CM | POA: Diagnosis not present

## 2019-08-01 DIAGNOSIS — Z87891 Personal history of nicotine dependence: Secondary | ICD-10-CM | POA: Diagnosis not present

## 2019-08-01 DIAGNOSIS — Z79899 Other long term (current) drug therapy: Secondary | ICD-10-CM | POA: Diagnosis not present

## 2019-08-01 DIAGNOSIS — I5032 Chronic diastolic (congestive) heart failure: Secondary | ICD-10-CM | POA: Diagnosis not present

## 2019-08-01 HISTORY — PX: CAROTID ENDARTERECTOMY: SUR193

## 2019-08-01 HISTORY — DX: Presence of dental prosthetic device (complete) (partial): K08.109

## 2019-08-01 HISTORY — DX: Presence of spectacles and contact lenses: Z97.3

## 2019-08-01 HISTORY — PX: ENDARTERECTOMY: SHX5162

## 2019-08-01 HISTORY — DX: Other specified health status: Z78.9

## 2019-08-01 HISTORY — PX: PATCH ANGIOPLASTY: SHX6230

## 2019-08-01 LAB — COMPREHENSIVE METABOLIC PANEL
ALT: 12 U/L (ref 0–44)
AST: 14 U/L — ABNORMAL LOW (ref 15–41)
Albumin: 3.6 g/dL (ref 3.5–5.0)
Alkaline Phosphatase: 75 U/L (ref 38–126)
Anion gap: 13 (ref 5–15)
BUN: 15 mg/dL (ref 8–23)
CO2: 23 mmol/L (ref 22–32)
Calcium: 9.5 mg/dL (ref 8.9–10.3)
Chloride: 105 mmol/L (ref 98–111)
Creatinine, Ser: 1.65 mg/dL — ABNORMAL HIGH (ref 0.44–1.00)
GFR calc Af Amer: 37 mL/min — ABNORMAL LOW (ref >60)
GFR calc non Af Amer: 32 mL/min — ABNORMAL LOW (ref >60)
Glucose, Bld: 98 mg/dL (ref 70–99)
Potassium: 3.3 mmol/L — ABNORMAL LOW (ref 3.5–5.1)
Sodium: 141 mmol/L (ref 135–145)
Total Bilirubin: 0.4 mg/dL (ref 0.3–1.2)
Total Protein: 8.5 g/dL — ABNORMAL HIGH (ref 6.5–8.1)

## 2019-08-01 LAB — CBC
HCT: 35.8 % — ABNORMAL LOW (ref 36.0–46.0)
Hemoglobin: 10.6 g/dL — ABNORMAL LOW (ref 12.0–15.0)
MCH: 25.5 pg — ABNORMAL LOW (ref 26.0–34.0)
MCHC: 29.6 g/dL — ABNORMAL LOW (ref 30.0–36.0)
MCV: 86.1 fL (ref 80.0–100.0)
Platelets: 498 10*3/uL — ABNORMAL HIGH (ref 150–400)
RBC: 4.16 MIL/uL (ref 3.87–5.11)
RDW: 23.7 % — ABNORMAL HIGH (ref 11.5–15.5)
WBC: 6.4 10*3/uL (ref 4.0–10.5)
nRBC: 0 % (ref 0.0–0.2)

## 2019-08-01 LAB — BLOOD GAS, ARTERIAL
Acid-Base Excess: 1.4 mmol/L (ref 0.0–2.0)
Bicarbonate: 25.3 mmol/L (ref 20.0–28.0)
FIO2: 32
O2 Saturation: 98.3 %
Patient temperature: 37
pCO2 arterial: 38.7 mmHg (ref 32.0–48.0)
pH, Arterial: 7.43 (ref 7.350–7.450)
pO2, Arterial: 112 mmHg — ABNORMAL HIGH (ref 83.0–108.0)

## 2019-08-01 LAB — URINALYSIS, ROUTINE W REFLEX MICROSCOPIC
Bilirubin Urine: NEGATIVE
Glucose, UA: NEGATIVE mg/dL
Hgb urine dipstick: NEGATIVE
Ketones, ur: NEGATIVE mg/dL
Nitrite: NEGATIVE
Protein, ur: NEGATIVE mg/dL
Specific Gravity, Urine: 1.011 (ref 1.005–1.030)
pH: 6 (ref 5.0–8.0)

## 2019-08-01 LAB — PROTIME-INR
INR: 1.1 (ref 0.8–1.2)
Prothrombin Time: 13.9 seconds (ref 11.4–15.2)

## 2019-08-01 LAB — SURGICAL PCR SCREEN
MRSA, PCR: NEGATIVE
Staphylococcus aureus: NEGATIVE

## 2019-08-01 LAB — TYPE AND SCREEN
ABO/RH(D): A POS
Antibody Screen: NEGATIVE

## 2019-08-01 LAB — APTT: aPTT: 31 seconds (ref 24–36)

## 2019-08-01 LAB — ABO/RH: ABO/RH(D): A POS

## 2019-08-01 SURGERY — ENDARTERECTOMY, CAROTID
Anesthesia: General | Site: Neck | Laterality: Right

## 2019-08-01 MED ORDER — LIDOCAINE HCL (PF) 1 % IJ SOLN
INTRAMUSCULAR | Status: AC
  Filled 2019-08-01: qty 30

## 2019-08-01 MED ORDER — ONDANSETRON HCL 4 MG/2ML IJ SOLN: 4.0000 mg | Freq: Once | INTRAMUSCULAR | Status: DC | PRN

## 2019-08-01 MED ORDER — PHENOL 1.4 % MT LIQD: 1.0000 | OROMUCOSAL | Status: DC | PRN

## 2019-08-01 MED ORDER — SODIUM CHLORIDE 0.9 % IV SOLN: 500.0000 mL | Freq: Once | INTRAVENOUS | Status: DC | PRN

## 2019-08-01 MED ORDER — DOCUSATE SODIUM 100 MG PO CAPS: 100.0000 mg | ORAL_CAPSULE | Freq: Two times a day (BID) | ORAL | Status: DC | PRN

## 2019-08-01 MED ORDER — CLOPIDOGREL BISULFATE 75 MG PO TABS
75.0000 mg | ORAL_TABLET | Freq: Every day | ORAL | Status: DC
  Administered 2019-08-02: 09:00:00 75 mg via ORAL
  Filled 2019-08-01: qty 1

## 2019-08-01 MED ORDER — FENTANYL CITRATE (PF) 250 MCG/5ML IJ SOLN
INTRAMUSCULAR | Status: AC
  Filled 2019-08-01: qty 5

## 2019-08-01 MED ORDER — ASPIRIN 325 MG PO TABS
325.0000 mg | ORAL_TABLET | Freq: Every day | ORAL | Status: DC
  Administered 2019-08-02: 325 mg via ORAL
  Filled 2019-08-01: qty 1

## 2019-08-01 MED ORDER — CARVEDILOL 6.25 MG PO TABS: 6.2500 mg | ORAL_TABLET | Freq: Two times a day (BID) | ORAL | Status: DC

## 2019-08-01 MED ORDER — 0.9 % SODIUM CHLORIDE (POUR BTL) OPTIME
TOPICAL | Status: DC | PRN
  Administered 2019-08-01 (×3): 1000 mL

## 2019-08-01 MED ORDER — PROTAMINE SULFATE 10 MG/ML IV SOLN
INTRAVENOUS | Status: AC
  Filled 2019-08-01: qty 5

## 2019-08-01 MED ORDER — PHENYLEPHRINE 40 MCG/ML (10ML) SYRINGE FOR IV PUSH (FOR BLOOD PRESSURE SUPPORT)
PREFILLED_SYRINGE | INTRAVENOUS | Status: AC
  Filled 2019-08-01: qty 10

## 2019-08-01 MED ORDER — HEMOSTATIC AGENTS (NO CHARGE) OPTIME
TOPICAL | Status: DC | PRN
  Administered 2019-08-01: 1 via TOPICAL

## 2019-08-01 MED ORDER — DOCUSATE SODIUM 100 MG PO CAPS
100.0000 mg | ORAL_CAPSULE | Freq: Every day | ORAL | Status: DC
  Filled 2019-08-01: qty 1

## 2019-08-01 MED ORDER — MECLIZINE HCL 25 MG PO TABS: 12.5000 mg | ORAL_TABLET | Freq: Three times a day (TID) | ORAL | Status: DC | PRN

## 2019-08-01 MED ORDER — ONDANSETRON HCL 4 MG/2ML IJ SOLN: 4.0000 mg | Freq: Four times a day (QID) | INTRAMUSCULAR | Status: DC | PRN

## 2019-08-01 MED ORDER — HEPARIN SODIUM (PORCINE) 1000 UNIT/ML IJ SOLN
INTRAMUSCULAR | Status: AC
  Filled 2019-08-01: qty 1

## 2019-08-01 MED ORDER — OXYCODONE HCL 5 MG PO TABS
5.0000 mg | ORAL_TABLET | Freq: Once | ORAL | Status: AC | PRN
  Administered 2019-08-01: 17:00:00 5 mg via ORAL

## 2019-08-01 MED ORDER — LABETALOL HCL 5 MG/ML IV SOLN: 10.0000 mg | INTRAVENOUS | Status: DC | PRN

## 2019-08-01 MED ORDER — FERROUS SULFATE 325 (65 FE) MG PO TABS
325.0000 mg | ORAL_TABLET | Freq: Two times a day (BID) | ORAL | Status: DC
  Administered 2019-08-02: 09:00:00 325 mg via ORAL
  Filled 2019-08-01: qty 1

## 2019-08-01 MED ORDER — AMLODIPINE BESYLATE 10 MG PO TABS
10.0000 mg | ORAL_TABLET | Freq: Every day | ORAL | Status: DC
  Administered 2019-08-02: 09:00:00 10 mg via ORAL
  Filled 2019-08-01: qty 1

## 2019-08-01 MED ORDER — OXYCODONE HCL 5 MG PO TABS
ORAL_TABLET | ORAL | Status: AC
  Filled 2019-08-01: qty 1

## 2019-08-01 MED ORDER — FENTANYL CITRATE (PF) 100 MCG/2ML IJ SOLN
INTRAMUSCULAR | Status: DC | PRN
  Administered 2019-08-01: 50 ug via INTRAVENOUS
  Administered 2019-08-01: 150 ug via INTRAVENOUS
  Administered 2019-08-01: 50 ug via INTRAVENOUS

## 2019-08-01 MED ORDER — MUPIROCIN 2 % EX OINT
TOPICAL_OINTMENT | CUTANEOUS | Status: AC
  Administered 2019-08-01: 1 via TOPICAL
  Filled 2019-08-01: qty 22

## 2019-08-01 MED ORDER — LIDOCAINE HCL (CARDIAC) PF 100 MG/5ML IV SOSY
PREFILLED_SYRINGE | INTRAVENOUS | Status: DC | PRN
  Administered 2019-08-01: 40 mg via INTRAVENOUS

## 2019-08-01 MED ORDER — SODIUM CHLORIDE 0.9 % IV SOLN
INTRAVENOUS | Status: DC
  Administered 2019-08-01: 19:00:00 via INTRAVENOUS

## 2019-08-01 MED ORDER — LIDOCAINE HCL (PF) 1 % IJ SOLN
INTRAMUSCULAR | Status: DC | PRN
  Administered 2019-08-01: .1 mL

## 2019-08-01 MED ORDER — ALBUTEROL SULFATE (2.5 MG/3ML) 0.083% IN NEBU: 2.5000 mg | INHALATION_SOLUTION | Freq: Four times a day (QID) | RESPIRATORY_TRACT | Status: DC | PRN

## 2019-08-01 MED ORDER — MORPHINE SULFATE (PF) 2 MG/ML IV SOLN
2.0000 mg | INTRAVENOUS | Status: DC | PRN
  Administered 2019-08-01: 2 mg via INTRAVENOUS
  Filled 2019-08-01: qty 1

## 2019-08-01 MED ORDER — ALBUMIN HUMAN 5 % IV SOLN
INTRAVENOUS | Status: DC | PRN
  Administered 2019-08-01: 15:00:00 via INTRAVENOUS

## 2019-08-01 MED ORDER — CHLORHEXIDINE GLUCONATE CLOTH 2 % EX PADS: 6.0000 | MEDICATED_PAD | Freq: Once | CUTANEOUS | Status: DC

## 2019-08-01 MED ORDER — OXYCODONE HCL 5 MG/5ML PO SOLN: 5.0000 mg | Freq: Once | ORAL | Status: AC | PRN

## 2019-08-01 MED ORDER — ONDANSETRON HCL 4 MG/2ML IJ SOLN
INTRAMUSCULAR | Status: AC
  Filled 2019-08-01: qty 2

## 2019-08-01 MED ORDER — PANTOPRAZOLE SODIUM 40 MG PO TBEC
40.0000 mg | DELAYED_RELEASE_TABLET | Freq: Two times a day (BID) | ORAL | Status: DC
  Administered 2019-08-01 – 2019-08-02 (×2): 40 mg via ORAL
  Filled 2019-08-01 (×2): qty 1

## 2019-08-01 MED ORDER — SODIUM CHLORIDE 0.9 % IV SOLN
INTRAVENOUS | Status: AC
  Filled 2019-08-01: qty 1.2

## 2019-08-01 MED ORDER — DEXAMETHASONE SODIUM PHOSPHATE 10 MG/ML IJ SOLN
INTRAMUSCULAR | Status: DC | PRN
  Administered 2019-08-01: 10 mg via INTRAVENOUS

## 2019-08-01 MED ORDER — PHENYLEPHRINE HCL (PRESSORS) 10 MG/ML IV SOLN
INTRAVENOUS | Status: DC | PRN
  Administered 2019-08-01: 80 ug via INTRAVENOUS
  Administered 2019-08-01: 40 ug via INTRAVENOUS

## 2019-08-01 MED ORDER — GLYCOPYRROLATE PF 0.2 MG/ML IJ SOSY
PREFILLED_SYRINGE | INTRAMUSCULAR | Status: AC
  Filled 2019-08-01: qty 1

## 2019-08-01 MED ORDER — GUAIFENESIN-DM 100-10 MG/5ML PO SYRP
15.0000 mL | ORAL_SOLUTION | ORAL | Status: DC | PRN
  Administered 2019-08-01: 22:00:00 15 mL via ORAL
  Filled 2019-08-01: qty 15

## 2019-08-01 MED ORDER — FENTANYL CITRATE (PF) 100 MCG/2ML IJ SOLN
25.0000 ug | INTRAMUSCULAR | Status: DC | PRN
  Administered 2019-08-01 (×2): 50 ug via INTRAVENOUS

## 2019-08-01 MED ORDER — BISACODYL 10 MG RE SUPP: 10.0000 mg | Freq: Every day | RECTAL | Status: DC | PRN

## 2019-08-01 MED ORDER — ALUM & MAG HYDROXIDE-SIMETH 200-200-20 MG/5ML PO SUSP: 15.0000 mL | ORAL | Status: DC | PRN

## 2019-08-01 MED ORDER — MOMETASONE FURO-FORMOTEROL FUM 200-5 MCG/ACT IN AERO
2.0000 | INHALATION_SPRAY | Freq: Two times a day (BID) | RESPIRATORY_TRACT | Status: DC
  Administered 2019-08-02: 2 via RESPIRATORY_TRACT
  Filled 2019-08-01: qty 8.8

## 2019-08-01 MED ORDER — LIDOCAINE 2% (20 MG/ML) 5 ML SYRINGE
INTRAMUSCULAR | Status: AC
  Filled 2019-08-01: qty 5

## 2019-08-01 MED ORDER — ACETAMINOPHEN 325 MG PO TABS: 325.0000 mg | ORAL_TABLET | ORAL | Status: DC | PRN

## 2019-08-01 MED ORDER — SODIUM CHLORIDE 0.9 % IV SOLN: INTRAVENOUS | Status: DC

## 2019-08-01 MED ORDER — POLYETHYLENE GLYCOL 3350 17 G PO PACK: 17.0000 g | PACK | Freq: Every day | ORAL | Status: DC | PRN

## 2019-08-01 MED ORDER — HEPARIN SODIUM (PORCINE) 1000 UNIT/ML IJ SOLN
INTRAMUSCULAR | Status: DC | PRN
  Administered 2019-08-01: 6000 [IU] via INTRAVENOUS

## 2019-08-01 MED ORDER — METOPROLOL TARTRATE 5 MG/5ML IV SOLN: 2.0000 mg | INTRAVENOUS | Status: DC | PRN

## 2019-08-01 MED ORDER — MAGNESIUM SULFATE 2 GM/50ML IV SOLN: 2.0000 g | Freq: Every day | INTRAVENOUS | Status: DC | PRN

## 2019-08-01 MED ORDER — ROCURONIUM BROMIDE 100 MG/10ML IV SOLN
INTRAVENOUS | Status: DC | PRN
  Administered 2019-08-01: 30 mg via INTRAVENOUS
  Administered 2019-08-01: 50 mg via INTRAVENOUS

## 2019-08-01 MED ORDER — MIRTAZAPINE 15 MG PO TABS
7.5000 mg | ORAL_TABLET | Freq: Every day | ORAL | Status: DC
  Administered 2019-08-01: 22:00:00 7.5 mg via ORAL
  Filled 2019-08-01: qty 1

## 2019-08-01 MED ORDER — SODIUM CHLORIDE 0.9 % IV SOLN
INTRAVENOUS | Status: DC | PRN
  Administered 2019-08-01: 14:00:00

## 2019-08-01 MED ORDER — EPHEDRINE SULFATE 50 MG/ML IJ SOLN
INTRAMUSCULAR | Status: DC | PRN
  Administered 2019-08-01 (×2): 5 mg via INTRAVENOUS
  Administered 2019-08-01: 10 mg via INTRAVENOUS
  Administered 2019-08-01: 5 mg via INTRAVENOUS

## 2019-08-01 MED ORDER — MUPIROCIN 2 % EX OINT
1.0000 "application " | TOPICAL_OINTMENT | Freq: Once | CUTANEOUS | Status: AC
  Administered 2019-08-01: 10:00:00 1 via TOPICAL

## 2019-08-01 MED ORDER — ATORVASTATIN CALCIUM 10 MG PO TABS: 10.0000 mg | ORAL_TABLET | Freq: Every day | ORAL | Status: DC

## 2019-08-01 MED ORDER — CEFAZOLIN SODIUM-DEXTROSE 2-4 GM/100ML-% IV SOLN
2.0000 g | Freq: Three times a day (TID) | INTRAVENOUS | Status: AC
  Administered 2019-08-01 – 2019-08-02 (×2): 2 g via INTRAVENOUS
  Filled 2019-08-01 (×2): qty 100

## 2019-08-01 MED ORDER — SUCRALFATE 1 G PO TABS
1.0000 g | ORAL_TABLET | Freq: Three times a day (TID) | ORAL | Status: DC
  Administered 2019-08-01 – 2019-08-02 (×3): 1 g via ORAL
  Filled 2019-08-01 (×3): qty 1

## 2019-08-01 MED ORDER — LACTATED RINGERS IV SOLN
INTRAVENOUS | Status: DC | PRN
  Administered 2019-08-01: 15:00:00 via INTRAVENOUS

## 2019-08-01 MED ORDER — OXYCODONE-ACETAMINOPHEN 5-325 MG PO TABS: 1.0000 | ORAL_TABLET | ORAL | Status: DC | PRN

## 2019-08-01 MED ORDER — SUGAMMADEX SODIUM 200 MG/2ML IV SOLN
INTRAVENOUS | Status: DC | PRN
  Administered 2019-08-01: 200 mg via INTRAVENOUS

## 2019-08-01 MED ORDER — GLYCOPYRROLATE 0.2 MG/ML IJ SOLN
INTRAMUSCULAR | Status: DC | PRN
  Administered 2019-08-01: 0.1 mg via INTRAVENOUS

## 2019-08-01 MED ORDER — PROTAMINE SULFATE 10 MG/ML IV SOLN
INTRAVENOUS | Status: DC | PRN
  Administered 2019-08-01 (×2): 10 mg via INTRAVENOUS
  Administered 2019-08-01: 20 mg via INTRAVENOUS

## 2019-08-01 MED ORDER — EPHEDRINE 5 MG/ML INJ
INTRAVENOUS | Status: AC
  Filled 2019-08-01: qty 10

## 2019-08-01 MED ORDER — FENTANYL CITRATE (PF) 100 MCG/2ML IJ SOLN
INTRAMUSCULAR | Status: AC
  Filled 2019-08-01: qty 2

## 2019-08-01 MED ORDER — CEFAZOLIN SODIUM-DEXTROSE 2-4 GM/100ML-% IV SOLN
2.0000 g | INTRAVENOUS | Status: AC
  Administered 2019-08-01: 2 g via INTRAVENOUS
  Filled 2019-08-01: qty 100

## 2019-08-01 MED ORDER — ACETAMINOPHEN 325 MG RE SUPP: 325.0000 mg | RECTAL | Status: DC | PRN

## 2019-08-01 MED ORDER — LORATADINE 10 MG PO TABS: 10.0000 mg | ORAL_TABLET | Freq: Every day | ORAL | Status: DC | PRN

## 2019-08-01 MED ORDER — PROPOFOL 10 MG/ML IV BOLUS
INTRAVENOUS | Status: DC | PRN
  Administered 2019-08-01: 100 mg via INTRAVENOUS

## 2019-08-01 MED ORDER — POTASSIUM CHLORIDE CRYS ER 20 MEQ PO TBCR: 20.0000 meq | EXTENDED_RELEASE_TABLET | Freq: Every day | ORAL | Status: DC | PRN

## 2019-08-01 MED ORDER — PHENYLEPHRINE HCL-NACL 10-0.9 MG/250ML-% IV SOLN
INTRAVENOUS | Status: DC | PRN
  Administered 2019-08-01: 40 ug/min via INTRAVENOUS

## 2019-08-01 MED ORDER — LEVOTHYROXINE SODIUM 50 MCG PO TABS
50.0000 ug | ORAL_TABLET | Freq: Every day | ORAL | Status: DC
  Administered 2019-08-02: 50 ug via ORAL
  Filled 2019-08-01: qty 1

## 2019-08-01 MED ORDER — LACTATED RINGERS IV SOLN
INTRAVENOUS | Status: DC
  Administered 2019-08-01: 10:00:00 via INTRAVENOUS

## 2019-08-01 MED ORDER — ONDANSETRON HCL 4 MG/2ML IJ SOLN
INTRAMUSCULAR | Status: DC | PRN
  Administered 2019-08-01: 4 mg via INTRAVENOUS

## 2019-08-01 MED ORDER — FUROSEMIDE 20 MG PO TABS
20.0000 mg | ORAL_TABLET | Freq: Every day | ORAL | Status: DC
  Administered 2019-08-02: 09:00:00 20 mg via ORAL
  Filled 2019-08-01: qty 1

## 2019-08-01 MED ORDER — HYDRALAZINE HCL 20 MG/ML IJ SOLN: 5.0000 mg | INTRAMUSCULAR | Status: DC | PRN

## 2019-08-01 MED ORDER — ROCURONIUM BROMIDE 10 MG/ML (PF) SYRINGE
PREFILLED_SYRINGE | INTRAVENOUS | Status: AC
  Filled 2019-08-01: qty 10

## 2019-08-01 SURGICAL SUPPLY — 56 items
CANISTER SUCT 3000ML PPV (MISCELLANEOUS) ×2 IMPLANT
CATH ROBINSON RED A/P 18FR (CATHETERS) ×2 IMPLANT
CLIP VESOCCLUDE MED 24/CT (CLIP) ×2 IMPLANT
CLIP VESOCCLUDE SM WIDE 24/CT (CLIP) ×2 IMPLANT
COVER PROBE W GEL 5X96 (DRAPES) ×1 IMPLANT
COVER TRANSDUCER ULTRASND GEL (DRAPE) ×1 IMPLANT
COVER WAND RF STERILE (DRAPES) ×1 IMPLANT
DERMABOND ADVANCED (GAUZE/BANDAGES/DRESSINGS) ×1
DERMABOND ADVANCED .7 DNX12 (GAUZE/BANDAGES/DRESSINGS) ×1 IMPLANT
DRAIN CHANNEL 15F RND FF W/TCR (WOUND CARE) IMPLANT
ELECT REM PT RETURN 9FT ADLT (ELECTROSURGICAL) ×2
ELECTRODE REM PT RTRN 9FT ADLT (ELECTROSURGICAL) ×1 IMPLANT
EVACUATOR SILICONE 100CC (DRAIN) IMPLANT
GAUZE 4X4 16PLY RFD (DISPOSABLE) ×1 IMPLANT
GLOVE BIO SURGEON STRL SZ7.5 (GLOVE) ×2 IMPLANT
GLOVE BIOGEL PI IND STRL 8 (GLOVE) ×1 IMPLANT
GLOVE BIOGEL PI INDICATOR 8 (GLOVE) ×1
GOWN STRL REUS W/ TWL LRG LVL3 (GOWN DISPOSABLE) ×2 IMPLANT
GOWN STRL REUS W/ TWL XL LVL3 (GOWN DISPOSABLE) ×2 IMPLANT
GOWN STRL REUS W/TWL LRG LVL3 (GOWN DISPOSABLE) ×2
GOWN STRL REUS W/TWL XL LVL3 (GOWN DISPOSABLE) ×1
HEMOSTAT SNOW SURGICEL 2X4 (HEMOSTASIS) IMPLANT
IV ADAPTER SYR DOUBLE MALE LL (MISCELLANEOUS) IMPLANT
KIT BASIN OR (CUSTOM PROCEDURE TRAY) ×2 IMPLANT
KIT SHUNT ARGYLE CAROTID ART 6 (VASCULAR PRODUCTS) IMPLANT
KIT TURNOVER KIT B (KITS) ×2 IMPLANT
LOOP VESSEL MINI RED (MISCELLANEOUS) ×1 IMPLANT
NDL HYPO 25GX1X1/2 BEV (NEEDLE) IMPLANT
NDL SPNL 20GX3.5 QUINCKE YW (NEEDLE) IMPLANT
NEEDLE HYPO 25GX1X1/2 BEV (NEEDLE) IMPLANT
NEEDLE SPNL 20GX3.5 QUINCKE YW (NEEDLE) IMPLANT
NS IRRIG 1000ML POUR BTL (IV SOLUTION) ×6 IMPLANT
PACK CAROTID (CUSTOM PROCEDURE TRAY) ×2 IMPLANT
PAD ARMBOARD 7.5X6 YLW CONV (MISCELLANEOUS) ×4 IMPLANT
PATCH VASC XENOSURE 1CMX6CM (Vascular Products) ×1 IMPLANT
PATCH VASC XENOSURE 1X6 (Vascular Products) IMPLANT
POSITIONER HEAD DONUT 9IN (MISCELLANEOUS) ×2 IMPLANT
SHUNT CAROTID BYPASS 10 (VASCULAR PRODUCTS) IMPLANT
SHUNT CAROTID BYPASS 12FRX15.5 (VASCULAR PRODUCTS) IMPLANT
SPONGE SURGIFOAM ABS GEL 100 (HEMOSTASIS) IMPLANT
STOPCOCK 4 WAY LG BORE MALE ST (IV SETS) IMPLANT
SUT ETHILON 3 0 PS 1 (SUTURE) IMPLANT
SUT MNCRL AB 4-0 PS2 18 (SUTURE) ×2 IMPLANT
SUT PROLENE 5 0 C 1 24 (SUTURE) ×2 IMPLANT
SUT PROLENE 6 0 BV (SUTURE) ×3 IMPLANT
SUT PROLENE 7 0 BV1 MDA (SUTURE) ×1 IMPLANT
SUT SILK 3 0 (SUTURE)
SUT SILK 3-0 18XBRD TIE 12 (SUTURE) IMPLANT
SUT VIC AB 2-0 CT1 27 (SUTURE) ×1
SUT VIC AB 2-0 CT1 TAPERPNT 27 (SUTURE) ×1 IMPLANT
SUT VIC AB 3-0 SH 27 (SUTURE) ×1
SUT VIC AB 3-0 SH 27X BRD (SUTURE) ×1 IMPLANT
SYR CONTROL 10ML LL (SYRINGE) IMPLANT
TOWEL GREEN STERILE (TOWEL DISPOSABLE) ×2 IMPLANT
TUBING ART PRESS 48 MALE/FEM (TUBING) IMPLANT
WATER STERILE IRR 1000ML POUR (IV SOLUTION) ×2 IMPLANT

## 2019-08-01 NOTE — Anesthesia Procedure Notes (Signed)
Arterial Line Insertion Start/End12/09/2018 10:55 AM, 08/01/2019 11:00 AM Performed by: Wilburn Cornelia, CRNA, CRNA  Patient location: Pre-op. Preanesthetic checklist: patient identified, IV checked, site marked, risks and benefits discussed, surgical consent, monitors and equipment checked, pre-op evaluation, timeout performed and anesthesia consent Lidocaine 1% used for infiltration Right, radial was placed Catheter size: 20 G Hand hygiene performed  and maximum sterile barriers used  Allen's test indicative of satisfactory collateral circulation Attempts: 2 Procedure performed without using ultrasound guided technique. Following insertion, dressing applied and Biopatch. Post procedure assessment: normal  Patient tolerated the procedure well with no immediate complications.

## 2019-08-01 NOTE — Anesthesia Postprocedure Evaluation (Signed)
Anesthesia Post Note  Patient: Melissa Knox  Procedure(s) Performed: ENDARTERECTOMY CAROTID (Right Neck) Patch Angioplasty (Right Neck)     Patient location during evaluation: PACU Anesthesia Type: General Level of consciousness: awake and alert Pain management: pain level controlled Vital Signs Assessment: post-procedure vital signs reviewed and stable Respiratory status: spontaneous breathing, nonlabored ventilation, respiratory function stable and patient connected to nasal cannula oxygen Cardiovascular status: blood pressure returned to baseline and stable Postop Assessment: no apparent nausea or vomiting Anesthetic complications: no    Last Vitals:  Vitals:   08/01/19 1709 08/01/19 1715  BP:  (!) 111/47  Pulse: 78 84  Resp: 17 16  Temp:    SpO2: 97% 99%    Last Pain:  Vitals:   08/01/19 1715  TempSrc:   PainSc: Asleep                 Ioan Landini COKER

## 2019-08-01 NOTE — Transfer of Care (Signed)
Immediate Anesthesia Transfer of Care Note  Patient: Melissa Knox  Procedure(s) Performed: ENDARTERECTOMY CAROTID (Right Neck) Patch Angioplasty (Right Neck)  Patient Location: PACU  Anesthesia Type:General  Level of Consciousness: awake, alert  and oriented  Airway & Oxygen Therapy: Patient Spontanous Breathing and Patient connected to nasal cannula oxygen  Post-op Assessment: Report given to RN, Post -op Vital signs reviewed and stable, Patient moving all extremities and Patient able to stick tongue midline  Post vital signs: Reviewed and stable  Last Vitals:  Vitals Value Taken Time  BP 110/62 08/01/19 1645  Temp 36.4 C 08/01/19 1645  Pulse 91 08/01/19 1650  Resp 19 08/01/19 1650  SpO2 96 % 08/01/19 1650  Vitals shown include unvalidated device data.  Last Pain:  Vitals:   08/01/19 1018  TempSrc:   PainSc: 0-No pain      Patients Stated Pain Goal: 0 (87/19/59 [id number])  Complications: No apparent anesthesia complications

## 2019-08-01 NOTE — Discharge Instructions (Signed)
° °  [facility] ° °Discharge Instructions °  °Carotid Endarterectomy (CEA) ° °Please refer to the following instructions for your post-procedure care. Your surgeon or physician assistant will discuss any changes with you. ° °Activity ° °You are encouraged to walk as much as you can. You can slowly return to normal activities but must avoid strenuous activity and heavy lifting until your doctor tell you it's okay. Avoid activities such as vacuuming or swinging a golf club. You can drive after one week if you are comfortable and you are no longer taking prescription pain medications. It is normal to feel tired for serval weeks after your surgery. It is also normal to have difficulty with sleep habits, eating, and bowel movements after surgery. These will go away with time. ° °Bathing/Showering ° °Shower daily after you go home. Do not soak in a bathtub, hot tub, or swim until the incision heals completely. ° °Incision Care ° °Shower every day. Clean your incision with mild soap and water. Pat the area dry with a clean towel. You do not need a bandage unless otherwise instructed. Do not apply any ointments or creams to your incision. You may have skin glue on your incision. Do not peel it off. It will come off on its own in about one week. Your incision may feel thickened and raised for several weeks after your surgery. This is normal and the skin will soften over time.  ° °For Men Only: It's okay to shave around the incision but do not shave the incision itself for 2 weeks. It is common to have numbness under your chin that could last for several months. ° °Diet ° °Resume your normal diet. There are no special food restrictions following this procedure. A low fat/low cholesterol diet is recommended for all patients with vascular disease. In order to heal from your surgery, it is CRITICAL to get adequate nutrition. Your body requires vitamins, minerals, and protein. Vegetables are the  best source of vitamins and minerals. Vegetables also provide the perfect balance of protein. Processed food has little nutritional value, so try to avoid this. ° °Medications ° °Resume taking all of your medications unless your doctor or physician assistant tells you not to. If your incision is causing pain, you may take over-the- counter pain relievers such as acetaminophen (Tylenol). If you were prescribed a stronger pain medication, please be aware these medications can cause nausea and constipation. Prevent nausea by taking the medication with a snack or meal. Avoid constipation by drinking plenty of fluids and eating foods with a high amount of fiber, such as fruits, vegetables, and grains.  °Do not take Tylenol if you are taking prescription pain medications. ° °Follow Up ° °Our office will schedule a follow up appointment 2-3 weeks following discharge. ° °Please call us immediately for any of the following conditions ° °Increased pain, redness, drainage (pus) from your incision site. °Fever of 101 degrees or higher. °If you should develop stroke (slurred speech, difficulty swallowing, weakness on one side of your body, loss of vision) you should call 911 and go to the nearest emergency room. ° °Reduce your risk of vascular disease: ° °Stop smoking. If you would like help call [facility] at 1-800-QUIT-NOW (1-800-784-8669) or [facility] at 336-586-4000. °Manage your cholesterol °Maintain a desired weight °Control your diabetes °Keep your blood pressure down ° °If you have any questions, please call the office at [phone]. ° °

## 2019-08-01 NOTE — Anesthesia Procedure Notes (Signed)
Procedure Name: Intubation Date/Time: 08/01/2019 2:28 PM Performed by: Lanissa Cashen T, CRNA Pre-anesthesia Checklist: Patient identified, Emergency Drugs available, Suction available and Patient being monitored Patient Re-evaluated:Patient Re-evaluated prior to induction Oxygen Delivery Method: Circle system utilized Preoxygenation: Pre-oxygenation with 100% oxygen Induction Type: IV induction Ventilation: Mask ventilation without difficulty Laryngoscope Size: Mac and 3 Grade View: Grade I Tube type: Oral Tube size: 7.5 mm Number of attempts: 1 Airway Equipment and Method: Stylet Placement Confirmation: ETT inserted through vocal cords under direct vision,  positive ETCO2 and breath sounds checked- equal and bilateral Secured at: 21 cm Tube secured with: Tape Dental Injury: Teeth and Oropharynx as per pre-operative assessment  Comments: Intubated by Orma Flaming, SRNA

## 2019-08-01 NOTE — Op Note (Signed)
OPERATIVE NOTE  PROCEDURE:   1.  right carotid endarterectomy with bovine patch angioplasty  PRE-OPERATIVE DIAGNOSIS: right symptomatic high grade carotid stenosis  POST-OPERATIVE DIAGNOSIS: same as above   SURGEON: Marty Heck, MD  ASSISTANT(S): Curt Jews, MD and Leontine Locket, [place]  ANESTHESIA: general  ESTIMATED BLOOD LOSS: <50 cc  FINDING(S): 1. High grade calcified right carotid plaque 2.  Continuous Doppler audible flow signatures are appropriate for each carotid artery after endartectomy and patch angioplasty. 3.  No evidence of intimal flap visualized on transverse or longitudinal ultrasonography.   SPECIMEN(S):  Carotid plaque (sent to Pathology)  INDICATIONS:   Melissa Knox is a 68 y.o. female who presents with right eye vision loss.  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  I initially recommended right carotid endarterectomy with aorto innominate bypass to treat her innominate lesion.  I did not feel the innominate lesion was amenable to stent given heavy calcification that was circumferential.  Ultimately the patient and her husband discussed options and elected for right carotid endarterectomy with medical management of the innominate lesion. I elected not to shunt during this case given the proximal innominate lesion that was identified on CTA prior to surgery.  She had good backbleeding from the ICA stump. The patient is aware that the risks of carotid endarterectomy include but are not limited to: bleeding, infection, stroke, myocardial infarction, death, cranial nerve injuries both temporary and permanent, neck hematoma, possible airway compromise, labile blood pressure post-operatively, cerebral hyperperfusion syndrome, and possible need for additional interventions in the future. The patient is aware of the risks and agrees to proceed forward with the  procedure.  DESCRIPTION: After full informed written consent was obtained from the patient, the patient was brought back to the operating room and placed supine upon the operating table.  Prior to induction, the patient received IV antibiotics.  After obtaining adequate anesthesia, the patient was placed into semi-Fowler position with a shoulder roll in place and the patient's neck slightly hyperextended and rotated away from the surgical site.  The patient was prepped in the standard fashion for a right carotid endarterectomy.  I made an incision anterior to the sternocleidomastoid muscle and dissected down through the subcutaneous tissue.  The platysma was opened with electrocautery.  I then used Bovie cautery and blunt dissection to dissect through the underlying platysma and to mobilize the anterior border of the sternocleidomastoid as well as the internal jugular vein laterally.  The facial vein was ligated with 3-0 silk and surgical clips and divided.  After identifying the carotid artery I used Metzenbaum scissors to bluntly dissect the common carotid artery and then controlled this with both a umbilical tape.  At this point in time the patient was given 100 units/kg of IV heparin and we checked an ACT to ensure it was greater than 250.  I then carried my dissection cephalad and mobilized the external carotid artery and superior thyroid artery and controlled each of these with a vessel loop.  I then dissected out the internal carotid artery well past the distal plaque.  The internal carotid artery was then controlled with a umbilical tape as well. I was careful to identify the vagus nerve between the internal jugular and common carotid and this was presereved.  We used an appendiceal retractor for added visualization.  I was also careful to identify and preserve the hypoglossal nerve and this was preserved.  Once our ACT was confirmed, I proceeded by clamping the internal carotid artery with a angled  bulldog clamp.  The proximal common carotid artery was controlled with a angled debakey clamp.  The external carotid was controlled with a vessel loop.  I subsequently opened the common carotid artery with an 11 blade scalpel in longitudinal fashion and extended the arteriotomy with Potts scissors onto the ICA past the distal plaque.  I tested the backbleeding of the ICA and it was excellent.  I had already planned not to shunt given the innominate lesion.  I then used a Garment/textile technologist and performed a endarterectomy starting in the common carotid artery.  The external carotid artery was endarterectomized with an eversion technique and I was careful to feather the distal ICA plaque.  The specimen was passed off the field.  The endarterectomy site was then flushed with heparinized saline and I was careful to ensure there were no flaps in the endarterectomy site.  There was one area in the distal common carotid artery where I had gotten thin with the endarterectomy and had to repair this with several 7-0 Prolene's in interrupted fashion.  I then brought a bovine carotid patch on the field and this was sewn in place with a running anastomosis using a 6-0 Prolene distally and a 5-0 proximal.  The bovine patch was trimmed accordingly.  The artery was flushed antegrade and retrograde prior to completion of the patch.  Once the patch was complete, I flushed up the external carotid artery first prior to releasing the internal carotid artery clamp.  An intraoperative doppler was performed with good signals in the internal and external carotid artery.  I also performed longitudinal ultrasonography that showed no intimal flaps.  Once I was happy with the intraoperative ultrasound the patient was given protamine for reversal.  I used surgical snow to get hemostasis around the patch.  Ultimately the platysma was closed in running fashion with 3-0 Vicryl.  The skin was closed with a running 4-0 Monocryl.  Dermabond was applied  with a dry sterile dressing.  The patient was awakened from anesthesia with no new neurological deficit and taken to PACU in stable condition.    COMPLICATIONS: None  CONDITION: Stable  Marty Heck, MD [facility] Office: [phone] Pager: [phone]  08/01/2019, 4:27 PM

## 2019-08-01 NOTE — H&P (Signed)
History and Physical Interval Note:  08/01/2019 1:09 PM  Melissa Knox  has presented today for surgery, with the diagnosis of right carotid stenosis.  The various methods of treatment have been discussed with the patient and family. After consideration of risks, benefits and other options for treatment, the patient has consented to  Procedure(s): ENDARTERECTOMY CAROTID (Right) as a surgical intervention.  The patient's history has been reviewed, patient examined, no change in status, stable for surgery.  I have reviewed the patient's chart and labs.  Questions were answered to the patient's satisfaction.    Plan right carotid endarterectomy.  The patient also has a high-grade innominate stenosis and I had discussed option of combined right carotid endarterectomy with aorto innominate bypass to appropriately treat the innominate lesion.  I do not think the innominate lesion is stentable given heavily calcification.  Ultimately due to her severe lung disease the patient and her husband have elected to not undergo sternotomy and to treat the innominate lesion with medications with dual antiplatelet therapy.  Melissa Knox   Reason for Consult: Right eye vision loss, high grade right ICA/innominate stenosis Referring Physician: Neurology/hospitlaist  MRN #:  [id number]  History of Present Illness: This is a 68 y.o. 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  Patient states she was in her normal state of health until 2 weeks ago when she lost vision in her right eye in the medial part of her vision field.  She ultimately went to the ED after consulting with an ophthalmologist that recommended urgent evaluation in  the emergency department.  She denies any previous TIAs or strokes prior to this event.  She states she was on just aspirin at home and is now on dual antiplatelet with aspirin and Plavix.  She states she has no neurologic deficits at this time other than the vision loss in her right eye.  No previous head or neck surgery.  She has no symptoms in her right arm given right innominate stenosis.  She denies any right arm fatigue.  She denies any dizziness or posterior circulation symptoms.  States she has quit smoking at this time.  States she is ambulatory and functional at home.  Husband is at bedside.      Past Medical History:  Diagnosis Date  . Atherosclerotic heart disease 06/12/2015  . CHF (congestive heart failure) ([place])   . Chronic congestive heart failure with left ventricular diastolic dysfunction ([place]) 04/12/2018  . CKD (chronic kidney disease) stage 2, GFR 60-89 ml/min 06/12/2015  . COPD (chronic obstructive pulmonary disease) ([place])   . Hypercalcemia 06/12/2015  . Hyperlipidemia 06/12/2015  . Hypertension 06/12/2015  . Hypothyroidism 06/12/2015  . Insomnia 06/12/2015  . Interstitial pneumonia ([place]) 04/12/2018  . 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 Shortness of breath 06/12/2015  . Solitary pulmonary nodule 06/12/2015         Past Surgical History:  Procedure Laterality Date  . GALLBLADDER SURGERY  2012    No Known Allergies         Prior to Admission medications   Medication Sig Start Date End Date Taking? Authorizing Provider  albuterol (PROVENTIL) (2.5 MG/3ML) 0.083% nebulizer solution Take 2.5 mg by nebulization every 6 (six) hours as  needed for wheezing or shortness of breath.   Yes [provider]  amLODipine (NORVASC) 10 MG tablet Take 1 tablet (10 mg total) by mouth daily. 02/15/19  Yes Boscia, Greer Ee, NP  atorvastatin (LIPITOR) 10 MG tablet Take 1 tablet (10 mg total) by mouth daily at 6 PM. 02/15/19  Yes Boscia, Greer Ee, NP   budesonide-formoterol (SYMBICORT) 160-4.5 MCG/ACT inhaler Inhale 2 puffs into the lungs 2 (two) times daily. 06/22/18  Yes Scarboro, Audie Clear, NP  carvedilol (COREG) 6.25 MG tablet Take 1 tablet (6.25 mg total) by mouth 2 (two) times daily. 07/17/18  Yes Boscia, Greer Ee, NP  docusate sodium (COLACE) 100 MG capsule Take 1 capsule (100 mg total) by mouth 2 (two) times daily as needed for mild constipation. 04/14/18  Yes Gouru, Illene Silver, MD  furosemide (LASIX) 20 MG tablet Take 1 tablet (20 mg total) by mouth daily. 09/14/18  Yes Scarboro, Audie Clear, NP  levothyroxine (SYNTHROID) 50 MCG tablet Take 1 tablet (50 mcg total) by mouth daily before breakfast. 06/06/19  Yes Boscia, Heather E, NP  loratadine (CLARITIN) 10 MG tablet TAKE 1 TABLET BY MOUTH EVERY DAY FOR ALLERGIES AS NEEDED Patient taking differently: Take 10 mg by mouth daily as needed for allergies.  06/25/19  Yes Boscia, Greer Ee, NP  losartan (COZAAR) 100 MG tablet Take 100 mg by mouth daily.   Yes Lateef, Munsoor, MD  meclizine (ANTIVERT) 12.5 MG tablet Take 12.5 mg by mouth 3 (three) times daily as needed for dizziness.  06/02/16  Yes [provider]  mirtazapine (REMERON) 15 MG tablet TAKE 1/2 OF A TABLET (7.5 MG TOTAL) BY MOUTH AT BEDTIME. Patient taking differently: Take 7.5 mg by mouth at bedtime.  01/31/19  Yes Boscia, Greer Ee, NP  OXYGEN Inhale 3 L into the lungs.   Yes [provider]  pantoprazole (PROTONIX) 40 MG tablet Take 1 tablet (40 mg total) by mouth 2 (two) times daily. 02/15/19  Yes Boscia, Heather E, NP  sucralfate (CARAFATE) 1 g tablet Take 1 tablet (1 g total) by mouth 4 (four) times daily -  with meals and at bedtime. 07/06/19  Yes Ronnell Freshwater, NP    Social History        Socioeconomic History  . Marital status: Married    Spouse name: Daryl  . Number of children: 3  . Years of education: 11  . Highest education level: 12th grade  Occupational History  . Not on file  Social  Needs  . Financial resource strain: Not hard at all  . Food insecurity    Worry: Never true    Inability: Never true  . Transportation needs    Medical: No    Non-medical: No  Tobacco Use  . Smoking status: Former Smoker    Quit date: 2013    Years since quitting: 7.8  . Smokeless tobacco: Never Used  Substance and Sexual Activity  . Alcohol use: No    Alcohol/week: 0.0 standard drinks  . Drug use: No  . Sexual activity: Yes    Birth control/protection: None  Lifestyle  . Physical activity    Days per week: 2 days    Minutes per session: 30 min  . Stress: Not at all  Relationships  . Social Herbalist on phone: Twice a week    Gets together: Once a week    Attends religious service: 1 to 4 times per year    Active member of club  or organization: No    Attends meetings of clubs or organizations: Never    Relationship status: Married  . Intimate partner violence    Fear of current or ex partner: No    Emotionally abused: No    Physically abused: No    Forced sexual activity: No  Other Topics Concern  . Not on file  Social History Narrative  . Not on file          Family History  Problem Relation Age of Onset  . Cancer Mother   . Hypertension Father   . Diabetes Father   . Cancer Father     ROS: [x]  Positive   [ ]  Negative   [ ]  All sytems reviewed and are negative  Cardiovascular: []  chest pain/pressure []  palpitations []  SOB lying flat []  DOE []  pain in legs while walking []  pain in legs at rest []  pain in legs at night []  non-healing ulcers []  hx of DVT []  swelling in legs  Pulmonary: []  productive cough []  asthma/wheezing []  home O2  Neurologic: []  weakness in []  arms []  legs []  numbness in []  arms []  legs []  hx of CVA []  mini stroke [] difficulty speaking or slurred speech [x]  temporary loss of vision in one eye (right eye) []  dizziness  Hematologic: []  hx of cancer []   bleeding problems []  problems with blood clotting easily  Endocrine:   []  diabetes []  thyroid disease  GI []  vomiting blood []  blood in stool  GU: []  CKD/renal failure []  HD--[]  M/W/F or []  T/T/S []  burning with urination []  blood in urine  Psychiatric: []  anxiety []  depression  Musculoskeletal: []  arthritis []  joint pain  Integumentary: []  rashes []  ulcers  Constitutional: []  fever []  chills   Physical Examination      Vitals:   07/19/19 1605 07/19/19 1620  BP: (!) 141/58 (!) 138/52  Pulse:    Resp: 19 12  Temp: 98 F (36.7 C) 97.7 F (36.5 C)  SpO2: 93% 92%   Body mass index is 26.95 kg/m.  General:  WDWN in NAD Gait: Not observed HENT: WNL, normocephalic Pulmonary: normal non-labored breathing, without Rales, rhonchi,  wheezing Cardiac: regular, without  Murmurs, rubs or gallops Abdomen: soft, NT/ND, no masses Vascular Exam/Pulses:  Right Left  Radial absent 2+ (normal)  Ulnar    Femoral Pressure dressing Weakly palpable  Popliteal    DP 1+ (weak) Good signal  PT     Extremities: without ischemic changes, without Gangrene , without cellulitis; without open wounds;  Musculoskeletal: no muscle wasting or atrophy       Neurologic: A&O X 3; Appropriate Affect ; SENSATION: normal; MOTOR FUNCTION:  moving all extremities equally. Speech is fluent/normal   CBC Labs (Brief)          Component Value Date/Time   WBC 6.7 07/19/2019 0409   RBC 3.05 (L) 07/19/2019 0409   HGB 7.3 (L) 07/19/2019 0409   HGB 10.1 (L) 09/22/2018 1013   HCT 24.9 (L) 07/19/2019 0409   HCT 32.8 (L) 09/22/2018 1013   PLT 414 (H) 07/19/2019 0409   PLT 383 09/22/2018 1013   MCV 81.6 07/19/2019 0409   MCV 79 09/22/2018 1013   MCV 101 (H) 08/26/2012 0524   MCH 23.9 (L) 07/19/2019 0409   MCHC 29.3 (L) 07/19/2019 0409   RDW 17.8 (H) 07/19/2019 0409   RDW 18.7 (H) 09/22/2018 1013   RDW 13.8 08/26/2012 0524   LYMPHSABS 2.0  07/19/2019 0409   LYMPHSABS 2.0  09/22/2018 1013   LYMPHSABS 1.1 08/26/2012 0524   MONOABS 0.5 07/19/2019 0409   MONOABS 0.9 08/26/2012 0524   EOSABS 0.3 07/19/2019 0409   EOSABS 0.6 (H) 09/22/2018 1013   EOSABS 0.0 08/26/2012 0524   BASOSABS 0.0 07/19/2019 0409   BASOSABS 0.1 09/22/2018 1013   BASOSABS 0.0 08/26/2012 0524      BMET Labs (Brief)          Component Value Date/Time   NA 141 07/19/2019 0409   NA 140 09/22/2018 1013   NA 138 08/24/2012 1531   K 3.5 07/19/2019 0409   K 3.8 08/27/2012 0417   CL 112 (H) 07/19/2019 0409   CL 106 08/24/2012 1531   CO2 21 (L) 07/19/2019 0409   CO2 25 08/24/2012 1531   GLUCOSE 90 07/19/2019 0409   GLUCOSE 80 08/24/2012 1531   BUN 9 07/19/2019 0409   BUN 20 09/22/2018 1013   BUN 12 08/24/2012 1531   CREATININE 1.48 (H) 07/19/2019 0409   CREATININE 0.92 08/24/2012 1531   CALCIUM 8.0 (L) 07/19/2019 0409   CALCIUM 8.0 (L) 08/24/2012 1531   GFRNONAA 36 (L) 07/19/2019 0409   GFRNONAA >60 08/24/2012 1531   GFRAA 42 (L) 07/19/2019 0409   GFRAA >60 08/24/2012 1531      COAGS: Recent Labs       Lab Results  Component Value Date   INR 1.1 07/17/2019   INR 1.22 02/06/2017       Non-Invasive Vascular Imaging:    MRI brain 07/17/19: no acute abnormality.  CTA neck from yesterday was personally reviewed by me.  She has a high-grade calcified atherosclerotic plaque in the distal common carotid proximal ICA bifurcation.  Separately she has a high-grade heavily calcified plaque in the innominate origin as well.  I also reviewed Dr. Abram Sander angiogram pictures from today which confirms high-grade right ICA lesion and high-grade innominate lesion that is very calcified.   ASSESSMENT/PLAN: This is a 68 y.o. female with multiple medical problems as listed above that presents with right eye vision loss approximately 2 weeks ago in the setting of high-grade right ICA lesion and high-grade  innominate lesion, both very calcified.  Suspect atheroembolic event from one of these lesions.  I discussed with the patient and her husband in detail about options moving forward.  Certainly could perform carotid endarterectomy with retrograde innominate stenting; however, the innominate lesion is heavily calcified and I have concern that a stent would be too high risk and probably would not completely deploy or open.  The alternative would be a aorto innominate bypass with mini sternotomy with graft originating off the ascending aorta and right carotid endarterectomy at the same time.  I have asked Dr. Kipp Brood with CT surgery to see the patient tomorrow.  Patient will need CTA chest to evaluate if the ascending aorta would be an option for graft implantation.  Partially imaged arch looks heavily calcified.  Certainly if she is deemed too high risk from CT surgery the other option would be right carotid endarterectomy and/or TCAR with medical management of her innominate lesion.  Vascular will follow.  Melissa Heck, MD [facility] [place] Office: [phone] Pager: [place]

## 2019-08-02 ENCOUNTER — Encounter (HOSPITAL_COMMUNITY): Payer: Self-pay | Admitting: General Practice

## 2019-08-02 LAB — CBC
HCT: 28.7 % — ABNORMAL LOW (ref 36.0–46.0)
Hemoglobin: 8.4 g/dL — ABNORMAL LOW (ref 12.0–15.0)
MCH: 25.4 pg — ABNORMAL LOW (ref 26.0–34.0)
MCHC: 29.3 g/dL — ABNORMAL LOW (ref 30.0–36.0)
MCV: 86.7 fL (ref 80.0–100.0)
Platelets: 421 10*3/uL — ABNORMAL HIGH (ref 150–400)
RBC: 3.31 MIL/uL — ABNORMAL LOW (ref 3.87–5.11)
RDW: 23 % — ABNORMAL HIGH (ref 11.5–15.5)
WBC: 7.8 10*3/uL (ref 4.0–10.5)
nRBC: 0 % (ref 0.0–0.2)

## 2019-08-02 LAB — BASIC METABOLIC PANEL
Anion gap: 8 (ref 5–15)
BUN: 11 mg/dL (ref 8–23)
CO2: 23 mmol/L (ref 22–32)
Calcium: 8.6 mg/dL — ABNORMAL LOW (ref 8.9–10.3)
Chloride: 108 mmol/L (ref 98–111)
Creatinine, Ser: 1.12 mg/dL — ABNORMAL HIGH (ref 0.44–1.00)
GFR calc Af Amer: 58 mL/min — ABNORMAL LOW (ref >60)
GFR calc non Af Amer: 50 mL/min — ABNORMAL LOW (ref >60)
Glucose, Bld: 126 mg/dL — ABNORMAL HIGH (ref 70–99)
Potassium: 4.1 mmol/L (ref 3.5–5.1)
Sodium: 139 mmol/L (ref 135–145)

## 2019-08-02 MED ORDER — OXYCODONE-ACETAMINOPHEN 5-325 MG PO TABS: 1.0000 | ORAL_TABLET | Freq: Four times a day (QID) | ORAL | 0 refills | Status: DC | PRN

## 2019-08-02 NOTE — Progress Notes (Addendum)
Progress Note    08/02/2019 7:23 AM 1 Day Post-Op  Subjective:  Denies further vision changes, one sided weakness or slurring speech.  Ready for discharge home today   Vitals:   08/02/19 0459 08/02/19 0500  BP: (!) 110/51 (!) 110/51  Pulse: 79 71  Resp: 16 15  Temp: 97.7 F (36.5 C)   SpO2: 100% 100%   Physical Exam: Lungs:  Non labored on 2L Incisions:  R neck incision soft Extremities:  Moving all extremities well Neurologic: A&O; CN grossly intact  CBC    Component Value Date/Time   WBC 7.8 08/02/2019 0411   RBC 3.31 (L) 08/02/2019 0411   HGB 8.4 (L) 08/02/2019 0411   HGB 10.1 (L) 09/22/2018 1013   HCT 28.7 (L) 08/02/2019 0411   HCT 32.8 (L) 09/22/2018 1013   PLT 421 (H) 08/02/2019 0411   PLT 383 09/22/2018 1013   MCV 86.7 08/02/2019 0411   MCV 79 09/22/2018 1013   MCV 101 (H) 08/26/2012 0524   MCH 25.4 (L) 08/02/2019 0411   MCHC 29.3 (L) 08/02/2019 0411   RDW 23.0 (H) 08/02/2019 0411   RDW 18.7 (H) 09/22/2018 1013   RDW 13.8 08/26/2012 0524   LYMPHSABS 2.0 07/19/2019 0409   LYMPHSABS 2.0 09/22/2018 1013   LYMPHSABS 1.1 08/26/2012 0524   MONOABS 0.5 07/19/2019 0409   MONOABS 0.9 08/26/2012 0524   EOSABS 0.3 07/19/2019 0409   EOSABS 0.6 (H) 09/22/2018 1013   EOSABS 0.0 08/26/2012 0524   BASOSABS 0.0 07/19/2019 0409   BASOSABS 0.1 09/22/2018 1013   BASOSABS 0.0 08/26/2012 0524    BMET    Component Value Date/Time   NA 139 08/02/2019 0411   NA 140 09/22/2018 1013   NA 138 08/24/2012 1531   K 4.1 08/02/2019 0411   K 3.8 08/27/2012 0417   CL 108 08/02/2019 0411   CL 106 08/24/2012 1531   CO2 23 08/02/2019 0411   CO2 25 08/24/2012 1531   GLUCOSE 126 (H) 08/02/2019 0411   GLUCOSE 80 08/24/2012 1531   BUN 11 08/02/2019 0411   BUN 20 09/22/2018 1013   BUN 12 08/24/2012 1531   CREATININE 1.12 (H) 08/02/2019 0411   CREATININE 0.92 08/24/2012 1531   CALCIUM 8.6 (L) 08/02/2019 0411   CALCIUM 8.0 (L) 08/24/2012 1531   GFRNONAA 50 (L) 08/02/2019  0411   GFRNONAA >60 08/24/2012 1531   GFRAA 58 (L) 08/02/2019 0411   GFRAA >60 08/24/2012 1531    INR    Component Value Date/Time   INR 1.1 08/01/2019 1035     Intake/Output Summary (Last 24 hours) at 08/02/2019 0723 Last data filed at 08/02/2019 0000 Gross per 24 hour  Intake 2549.11 ml  Output 700 ml  Net 1849.11 ml     Assessment/Plan:  68 y.o. female is s/p R CEA 1 Day Post-Op   Neuro exam remains at baseline Patient and husband elected medical management for innominate artery lesion Ok for discharge home this morning Follow up with Dr. Carlis Abbott in about 3 weeks for incision check   Dagoberto Ligas, PA-C [facility] [phone] 08/02/2019 7:23 AM  I have seen and evaluated the patient. I agree with the PA note as documented above.  Postop day 1 status post right carotid endarterectomy.  She is doing very well.  Remains neurologically intact.  Neck looks great.  She does have an innominate lesion that is high-grade that we did not treat given the patient elected conservative management of this.  We will need to continue aspirin Plavix.  Plan for discharge.  Follow-up in 3 weeks for wound check.  Marty Heck, MD [facility] [place] Office: [phone] Pager: [phone]

## 2019-08-02 NOTE — Discharge Summary (Signed)
Discharge Summary     Melissa Knox 07/03/1951 68 y.o. female  [id number]  Admission Date: 08/01/2019  Discharge Date: 08/02/19  Physician: Marty Heck, MD  Admission Diagnosis: right carotid stenosis  Discharge Day services:    see progress note 08/02/19 Physical Exam: Vitals:   08/02/19 0459 08/02/19 0500  BP: (!) 110/51 (!) 110/51  Pulse: 79 71  Resp: 16 15  Temp: 97.7 F (36.5 C)   SpO2: 100% 100%    Hospital Course:  The patient was admitted to the hospital and taken to the operating room on 08/01/2019 and underwent right carotid endarterectomy.  The pt tolerated the procedure well and was transported to the PACU in good condition.   By POD 1, the pt neuro status remained at baseline  The remainder of the hospital course consisted of increasing mobilization and increasing intake of solids without difficulty.  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  After weighing options ultimately patient and husband decided to proceed conservatively with medical management for innominate lesion.  She will follow-up in office for incision check in about 2 to 3 weeks with Dr. Carlis Abbott.  She will be discharged with 1 to 2 days of narcotic pain medication for continued postoperative pain control.  Discharge instructions were reviewed with the patient and she voices her understanding.  She will be discharged home this morning in stable condition.   Recent Labs    08/01/19 1035 08/02/19 0411  NA 141 139  K 3.3* 4.1  CL 105 108  CO2 23 23  GLUCOSE 98 126*  BUN 15 11  CALCIUM 9.5 8.6*   Recent Labs    08/01/19 1035 08/02/19 0411  WBC 6.4 7.8  HGB 10.6* 8.4*  HCT 35.8* 28.7*  PLT 498* 421*   Recent Labs    08/01/19 1035  INR 1.1       Discharge Diagnosis:  right carotid stenosis  Secondary Diagnosis: Patient Active Problem List   Diagnosis Date Noted  . Carotid artery stenosis, symptomatic, right 08/01/2019  .  Acute CVA (cerebrovascular accident) ([place]) 07/17/2019  . Peptic ulcer disease 02/17/2019  . Malnutrition of moderate degree 09/07/2018  . Acute on chronic respiratory failure with hypoxia ([place]) 09/05/2018  . Screening for breast cancer 07/19/2018  . HCAP (healthcare-associated pneumonia) 06/03/2018  . Chronic congestive heart failure with left ventricular diastolic dysfunction ([place]) 04/12/2018  . Obstructive chronic bronchitis without exacerbation ([place]) 02/17/2018  . Major depression, chronic 02/17/2018  . 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 Pollen allergies 01/16/2018  . CHF (congestive heart failure) ([place]) 05/28/2017  . Syncope 02/06/2017  . Pancreatic mass 02/06/2017  . Iron deficiency anemia 05/01/2016  . Anemia in chronic renal disease 04/26/2016  . Insomnia 06/12/2015  . CKD (chronic kidney disease) stage 2, GFR 60-89 ml/min 06/12/2015  . Hypercalcemia 06/12/2015  . Hypothyroidism 06/12/2015  . 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 Hyperlipidemia 06/12/2015  . Essential hypertension 06/12/2015  . Atherosclerotic heart disease 06/12/2015  . Solitary pulmonary nodule 06/12/2015   Past Medical History:  Diagnosis Date  . Atherosclerotic heart disease 06/12/2015  . CHF (congestive heart failure) ([place])   . Chronic congestive heart failure with left ventricular diastolic dysfunction ([place]) 04/12/2018  . CKD (chronic kidney disease) stage 2, GFR 60-89 ml/min 06/12/2015  . COPD (chronic obstructive pulmonary disease) ([place])   . Full dentures   . Hypercalcemia 06/12/2015  . Hyperlipidemia 06/12/2015  . Hypertension 06/12/2015  . Hypothyroidism 06/12/2015  . Insomnia  06/12/2015  . Interstitial pneumonia ([place]) 04/12/2018  . On supplemental oxygen by nasal cannula    at HS and PRN  . 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 Shortness of breath 06/12/2015  . Solitary pulmonary nodule 06/12/2015  . Wears glasses     Allergies as of 08/02/2019   No Known Allergies      Medication List    TAKE these medications   albuterol (2.5 MG/3ML) 0.083% nebulizer solution Commonly known as: PROVENTIL Take 2.5 mg by nebulization every 6 (six) hours as needed for wheezing or shortness of breath.   amLODipine 10 MG tablet Commonly known as: NORVASC Take 1 tablet (10 mg total) by mouth daily.   aspirin 325 MG tablet Take 1 tablet (325 mg total) by mouth daily.   atorvastatin 10 MG tablet Commonly known as: LIPITOR Take 1 tablet (10 mg total) by mouth daily at 6 PM.   budesonide-formoterol 160-4.5 MCG/ACT inhaler Commonly known as: SYMBICORT Inhale 2 puffs into the lungs 2 (two) times daily.   carvedilol 6.25 MG tablet Commonly known as: COREG Take 1 tablet (6.25 mg total) by mouth 2 (two) times daily.   clopidogrel 75 MG tablet Commonly known as: PLAVIX Take 1 tablet (75 mg total) by mouth daily.   docusate sodium 100 MG capsule Commonly known as: COLACE Take 1 capsule (100 mg total) by mouth 2 (two) times daily as needed for mild constipation.   ferrous sulfate 325 (65 FE) MG tablet Take 1 tablet (325 mg total) by mouth 2 (two) times daily with a meal.   furosemide 20 MG tablet Commonly known as: LASIX Take 1 tablet (20 mg total) by mouth daily.   levothyroxine 50 MCG tablet Commonly known as: SYNTHROID Take 1 tablet (50 mcg total) by mouth daily before breakfast.   loratadine 10 MG tablet Commonly known as: CLARITIN TAKE 1 TABLET BY MOUTH EVERY DAY FOR ALLERGIES AS NEEDED What changed:   how much to take  how to take this  when to take this  reasons to take this  additional instructions   losartan 100 MG tablet Commonly known as: COZAAR Take 100 mg by mouth daily.   meclizine 12.5 MG tablet Commonly known as: ANTIVERT Take 12.5 mg by mouth 3 (three) times daily as needed for dizziness.   mirtazapine 15 MG tablet Commonly known as: REMERON TAKE 1/2 OF A TABLET (7.5 MG TOTAL) BY MOUTH AT BEDTIME. What changed: See the new  instructions.   oxyCODONE-acetaminophen 5-325 MG tablet Commonly known as: PERCOCET/ROXICET Take 1 tablet by mouth every 6 (six) hours as needed for moderate pain.   OXYGEN Inhale 3 L into the lungs.   pantoprazole 40 MG tablet Commonly known as: Protonix Take 1 tablet (40 mg total) by mouth 2 (two) times daily.   sucralfate 1 g tablet Commonly known as: CARAFATE Take 1 tablet (1 g total) by mouth 4 (four) times daily -  with meals and at bedtime.        Discharge Instructions:   [facility] Discharge Instructions Carotid Endarterectomy (CEA)  Please refer to the following instructions for your post-procedure care. Your surgeon or physician assistant will discuss any changes with you.  Activity  You are encouraged to walk as much as you can. You can slowly return to normal activities but must avoid strenuous activity and heavy lifting until your doctor tell you it's OK. Avoid activities such as vacuuming or swinging a golf club. You can drive  after one week if you are comfortable and you are no longer taking prescription pain medications. It is normal to feel tired for serval weeks after your surgery. It is also normal to have difficulty with sleep habits, eating, and bowel movements after surgery. These will go away with time.  Bathing/Showering  You may shower after you come home. Do not soak in a bathtub, hot tub, or swim until the incision heals completely.  Incision Care  Shower every day. Clean your incision with mild soap and water. Pat the area dry with a clean towel. You do not need a bandage unless otherwise instructed. Do not apply any ointments or creams to your incision. You may have skin glue on your incision. Do not peel it off. It will come off on its own in about one week. Your incision may feel thickened and raised for several weeks after your surgery. This is normal and the skin will soften over time. For Men Only: It's OK to  shave around the incision but do not shave the incision itself for 2 weeks. It is common to have numbness under your chin that could last for several months.  Diet  Resume your normal diet. There are no special food restrictions following this procedure. A low fat/low cholesterol diet is recommended for all patients with vascular disease. In order to heal from your surgery, it is CRITICAL to get adequate nutrition. Your body requires vitamins, minerals, and protein. Vegetables are the best source of vitamins and minerals. Vegetables also provide the perfect balance of protein. Processed food has little nutritional value, so try to avoid this.  Medications  Resume taking all of your medications unless your doctor or physician assistant tells you not to.  If your incision is causing pain, you may take over-the- counter pain relievers such as acetaminophen (Tylenol). If you were prescribed a stronger pain medication, please be aware these medications can cause nausea and constipation.  Prevent nausea by taking the medication with a snack or meal. Avoid constipation by drinking plenty of fluids and eating foods with a high amount of fiber, such as fruits, vegetables, and grains. Do not take Tylenol if you are taking prescription pain medications.  Follow Up  Our office will schedule a follow up appointment 2-3 weeks following discharge.  Please call us immediately for any of the following conditions  Increased pain, redness, drainage (pus) from your incision site. Fever of 101 degrees or higher. If you should develop stroke (slurred speech, difficulty swallowing, weakness on one side of your body, loss of vision) you should call 911 and go to the nearest emergency room.  Reduce your risk of vascular disease:  Stop smoking. If you would like help call [facility] at 1-800-QUIT-NOW 7868618438) or [place] at (469)770-9607. Manage your cholesterol Maintain a desired weight Control your  diabetes Keep your blood pressure down  If you have any questions, please call the office at [phone].   Disposition: home  Patient's condition: is Good  Follow up: 1. Dr. Carlis Abbott in 2-3 weeks.   Dagoberto Ligas, PA-C [facility] [phone]   --- For [facility] Registry use ---   Modified Rankin score at D/C (0-6): 0  IV medication needed for:  1. Hypertension: No 2. Hypotension: No  Post-op Complications: No  1. Post-op CVA or TIA: No  If yes: Event classification (right eye, left eye, right cortical, left cortical, verterobasilar, other):   If yes: Timing of event (intra-op, <6 hrs post-op, >=6 hrs  post-op, unknown):   2. CN injury: No  If yes: CN  injuried   3. Myocardial infarction: No  If yes: Dx by (EKG or clinical, Troponin):   4.  CHF: No  5.  Dysrhythmia (new): No  6. Wound infection: No  7. Reperfusion symptoms: No  8. Return to OR: No  If yes: return to OR for (bleeding, neurologic, other CEA incision, other):   Discharge medications: Statin use:  Yes ASA use:  Yes   Beta blocker use:  Yes ACE-Inhibitor use:  No  ARB use:  Yes CCB use: Yes P2Y12 Antagonist use: Yes, [ x] Plavix, [ ]  Plasugrel, [ ]  Ticlopinine, [ ]  Ticagrelor, [ ]  Other, [ ]  No for medical reason, [ ]  Non-compliant, [ ]  Not-indicated Anti-coagulant use:  No, [ ]  Warfarin, [ ]  Rivaroxaban, [ ]  Dabigatran,

## 2019-08-02 NOTE — Consult Note (Signed)
   [facility] CM Inpatient Consult   08/02/2019  Melissa Knox May 01, 1951 [id number]    Patientwas reviewedfor having 30-day readmission and 2 hospitalizations in the past 6 months, with 22% high risk score forunplanned readmissions; and to check for potential needs of [facility] Care Management services under her Medicare/ [employer] insurance benefit   Patient had been previously engaged by [facility] RN CM for COPD management and [facility] pharmacy for medication assistance.  Perchart review and MDhistory and physical on 08/01/19 reveal as follows: 68 y.o.femalewith history of COPD on 3 L home oxygen, CKD stage II, CAD, history of remote tobacco abuse, hypertension, hyperlipidemia, diastolic heart failure,   presents with right eye vision loss, after further work-up, was found to have high-grade right carotid stenosis as well as a high-grade innominate stenosis, underwent right carotid endarterectomy.   Calledpatientand was able to speak to her over the phone. HIPAA verification obtained.Patient reports of possibly discharging home today. [facility] care management services discussed with patient, however, shehas indicated having no current needsor barrierswith medications(self-managed); [facility] transportation by husband- if needed, but reports still able to drive short distances; and verbalized that she is still independently functioning at home.  She also mentioned having good understanding of chronic health conditions like COPD and HF management. Patient does not believe needing additional services at this point. She agreed to follow-up with primary care provider post discharge and to notify provider of any health concerns or needs that may arise.  Patient endorses Leretha Pol, NP with [facility] as her primary care provider.   Patient made aware of [facility] calls to follow her up post discharge.   Of note, [facility] Care Management services does not replace or  interfere with any services that are arranged by transition of care case management or social work.  If there are changes in disposition and needs for appropriate community follow-up, please refer to [facility] caremanagement.    For questions and additional information, please contact:  Delmi Fulfer A. Shaneeka Scarboro, BSN, RN-BC [facility] Liaison Cell: [phone]

## 2019-08-02 NOTE — Progress Notes (Signed)
IV and telemetry discontinued at this time. CCMD notified. Discharge instructions reviewed with patient. All questions answered.  

## 2019-08-02 NOTE — Plan of Care (Signed)
Continue to monitor

## 2019-08-06 ENCOUNTER — Other Ambulatory Visit: Payer: Self-pay

## 2019-08-06 MED ORDER — SUCRALFATE 1 G PO TABS: 1.0000 g | ORAL_TABLET | Freq: Three times a day (TID) | ORAL | 1 refills | Status: DC

## 2019-08-07 LAB — POCT ACTIVATED CLOTTING TIME: Activated Clotting Time: 252 seconds

## 2019-08-14 ENCOUNTER — Other Ambulatory Visit: Payer: Self-pay | Admitting: Adult Health

## 2019-08-15 ENCOUNTER — Other Ambulatory Visit: Payer: Self-pay

## 2019-08-15 DIAGNOSIS — K279 Peptic ulcer, site unspecified, unspecified as acute or chronic, without hemorrhage or perforation: Secondary | ICD-10-CM

## 2019-08-15 MED ORDER — PANTOPRAZOLE SODIUM 40 MG PO TBEC: 40.0000 mg | DELAYED_RELEASE_TABLET | Freq: Two times a day (BID) | ORAL | 5 refills | Status: DC

## 2019-08-17 ENCOUNTER — Other Ambulatory Visit: Payer: Self-pay

## 2019-08-17 DIAGNOSIS — F329 Major depressive disorder, single episode, unspecified: Secondary | ICD-10-CM

## 2019-08-17 MED ORDER — MIRTAZAPINE 15 MG PO TABS: ORAL_TABLET | ORAL | 0 refills | Status: DC

## 2019-08-21 ENCOUNTER — Other Ambulatory Visit: Payer: Self-pay

## 2019-08-21 ENCOUNTER — Telehealth: Payer: Self-pay

## 2019-08-21 ENCOUNTER — Ambulatory Visit (INDEPENDENT_AMBULATORY_CARE_PROVIDER_SITE_OTHER): Payer: Self-pay | Admitting: Physician Assistant

## 2019-08-21 VITALS — BP 92/63 | HR 84 | Temp 97.0°F | Resp 16 | Ht 60.0 in | Wt 142.0 lb

## 2019-08-21 DIAGNOSIS — I6523 Occlusion and stenosis of bilateral carotid arteries: Secondary | ICD-10-CM

## 2019-08-21 NOTE — Telephone Encounter (Signed)
CONFIRMED AND SCREENED FOR 08-23-19 OV.

## 2019-08-21 NOTE — Progress Notes (Signed)
POST OPERATIVE OFFICE NOTE    CC:  F/u for surgery  HPI:  This is a 68 y.o. female who is s/p right carotid endarterectomy with bovine patch angioplasty for right symptomatic high grade carotid stenosis.  She had visual changes in the right eyeShe denise weakness in extremities, no aphasia, and no amaurosis.  No Known Allergies  Current Outpatient Medications  Medication Sig Dispense Refill  . albuterol (PROVENTIL) (2.5 MG/3ML) 0.083% nebulizer solution Take 2.5 mg by nebulization every 6 (six) hours as needed for wheezing or shortness of breath.    Marland Kitchen amLODipine (NORVASC) 10 MG tablet Take 1 tablet (10 mg total) by mouth daily. 30 tablet 5  . aspirin 325 MG tablet Take 1 tablet (325 mg total) by mouth daily. 90 tablet 0  . atorvastatin (LIPITOR) 10 MG tablet Take 1 tablet (10 mg total) by mouth daily at 6 PM. 30 tablet 5  . budesonide-formoterol (SYMBICORT) 160-4.5 MCG/ACT inhaler Inhale 2 puffs into the lungs 2 (two) times daily. 1 Inhaler 2  . carvedilol (COREG) 6.25 MG tablet Take 1 tablet (6.25 mg total) by mouth 2 (two) times daily. 60 tablet 5  . clopidogrel (PLAVIX) 75 MG tablet Take 1 tablet (75 mg total) by mouth daily. 90 tablet 0  . docusate sodium (COLACE) 100 MG capsule Take 1 capsule (100 mg total) by mouth 2 (two) times daily as needed for mild constipation. 10 capsule 0  . furosemide (LASIX) 20 MG tablet TAKE 1 TABLET BY MOUTH EVERY DAY 30 tablet 6  . levothyroxine (SYNTHROID) 50 MCG tablet Take 1 tablet (50 mcg total) by mouth daily before breakfast. 90 tablet 0  . loratadine (CLARITIN) 10 MG tablet TAKE 1 TABLET BY MOUTH EVERY DAY FOR ALLERGIES AS NEEDED (Patient taking differently: Take 10 mg by mouth daily as needed for allergies. ) 90 tablet 0  . losartan (COZAAR) 100 MG tablet Take 100 mg by mouth daily.    . meclizine (ANTIVERT) 12.5 MG tablet Take 12.5 mg by mouth 3 (three) times daily as needed for dizziness.     . mirtazapine (REMERON) 15 MG tablet TAKE 1/2 OF A  TABLET (7.5 MG TOTAL) BY MOUTH AT BEDTIME. 45 tablet 0  . oxyCODONE-acetaminophen (PERCOCET/ROXICET) 5-325 MG tablet Take 1 tablet by mouth every 6 (six) hours as needed for moderate pain. 20 tablet 0  . OXYGEN Inhale 3 L into the lungs.    . pantoprazole (PROTONIX) 40 MG tablet Take 1 tablet (40 mg total) by mouth 2 (two) times daily. 60 tablet 5  . sucralfate (CARAFATE) 1 g tablet Take 1 tablet (1 g total) by mouth 4 (four) times daily -  with meals and at bedtime. 120 tablet 1  . ferrous sulfate 325 (65 FE) MG tablet Take 1 tablet (325 mg total) by mouth 2 (two) times daily with a meal. 60 tablet 0   No current facility-administered medications for this visit.     ROS:  See HPI  Physical Exam:  Vitals:   08/21/19 1245 08/21/19 1249  BP: (!) 156/58 92/63  Pulse: 84   Resp: 16   Temp: (!) 97 F (36.1 C)   SpO2: (!) 85%      Incision:  Right neck incision well healed.  Removed partial skin glue today in office. Extremities:  Grip 5/5, palpable radial pulses B, motor all 4 ext. Intact without weakness. Neuro: No tongue deviation and no facial droop.   Assessment/Plan:  This is a 68 y.o. female who is  s/p: Symptomatic right ICA stenosis s/p right CEA    She will f/u in 9 months for repeat carotid duplex.  We reviewed signs of stroke.  If she has problems or concerns she will call.   Roxy Horseman PA-C [facility] [phone]  Clinic MD:  Early

## 2019-08-23 ENCOUNTER — Encounter: Payer: Self-pay | Admitting: Nurse Practitioner

## 2019-08-23 ENCOUNTER — Other Ambulatory Visit: Payer: Self-pay

## 2019-08-23 ENCOUNTER — Ambulatory Visit (INDEPENDENT_AMBULATORY_CARE_PROVIDER_SITE_OTHER): Payer: Medicare Other | Admitting: Nurse Practitioner

## 2019-08-23 VITALS — Ht 61.0 in

## 2019-08-23 DIAGNOSIS — D509 Iron deficiency anemia, unspecified: Secondary | ICD-10-CM

## 2019-08-23 DIAGNOSIS — F329 Major depressive disorder, single episode, unspecified: Secondary | ICD-10-CM | POA: Diagnosis not present

## 2019-08-23 DIAGNOSIS — J449 Chronic obstructive pulmonary disease, unspecified: Secondary | ICD-10-CM

## 2019-08-23 DIAGNOSIS — Z9981 Dependence on supplemental oxygen: Secondary | ICD-10-CM

## 2019-08-23 DIAGNOSIS — I679 Cerebrovascular disease, unspecified: Secondary | ICD-10-CM

## 2019-08-23 MED ORDER — ASPIRIN 325 MG PO TABS: 325.0000 mg | ORAL_TABLET | Freq: Every day | ORAL | 1 refills | Status: DC

## 2019-08-23 MED ORDER — FERROUS SULFATE 325 (65 FE) MG PO TABS: 325.0000 mg | ORAL_TABLET | Freq: Two times a day (BID) | ORAL | 1 refills | Status: DC

## 2019-08-23 MED ORDER — MIRTAZAPINE 15 MG PO TABS: ORAL_TABLET | ORAL | 1 refills | Status: DC

## 2019-08-23 MED ORDER — CLOPIDOGREL BISULFATE 75 MG PO TABS: 75.0000 mg | ORAL_TABLET | Freq: Every day | ORAL | 1 refills | Status: DC

## 2019-08-23 NOTE — Progress Notes (Signed)
[facility] [place], [place] [address]  Internal MEDICINE  Telephone Visit  Patient Name: Melissa Knox  [id number]  [id number]  Date of Service: 09/01/2019  I connected with the patient at 11:14am by telephone and verified the patients identity using two identifiers.   I discussed the limitations, risks, security and privacy concerns of performing an evaluation and management service by telephone and the availability of in person appointments. I also discussed with the patient that there may be a patient responsible charge related to the service.  The patient expressed understanding and agrees to proceed.    Chief Complaint  Patient presents with  . Telephone Assessment    follow-up from procedure, pt states she is doing good overall  . Telephone Screen  . Hypertension  . Medication Refill    remeron    The patient has been contacted via telephone for follow up visit due to concerns for spread of novel coronavirus. She presents for follow up visit. She recently had mini stroke, effecting the vision in her right eye. She had endarteriectomy on right side 08/01/2019. She had follow up with the surgeon this past Tuesday. Said she was healing well. Scar looks good. She has no concerns or complaints. She states that blood pressure has been well managed. She states that her breathing is doing well. She is wearing oxygen at all times.       Current Medication: Outpatient Encounter Medications as of 08/23/2019  Medication Sig Note  . albuterol (PROVENTIL) (2.5 MG/3ML) 0.083% nebulizer solution Take 2.5 mg by nebulization every 6 (six) hours as needed for wheezing or shortness of breath.   Marland Kitchen amLODipine (NORVASC) 10 MG tablet Take 1 tablet (10 mg total) by mouth daily.   Marland Kitchen aspirin 325 MG tablet Take 1 tablet (325 mg total) by mouth daily.   Marland Kitchen atorvastatin (LIPITOR) 10 MG tablet Take 1 tablet (10 mg total) by mouth daily at 6 PM.   . budesonide-formoterol  (SYMBICORT) 160-4.5 MCG/ACT inhaler Inhale 2 puffs into the lungs 2 (two) times daily.   . carvedilol (COREG) 6.25 MG tablet Take 1 tablet (6.25 mg total) by mouth 2 (two) times daily.   . clopidogrel (PLAVIX) 75 MG tablet Take 1 tablet (75 mg total) by mouth daily.   Marland Kitchen docusate sodium (COLACE) 100 MG capsule Take 1 capsule (100 mg total) by mouth 2 (two) times daily as needed for mild constipation. 07/17/2019: .  . furosemide (LASIX) 20 MG tablet TAKE 1 TABLET BY MOUTH EVERY DAY   . levothyroxine (SYNTHROID) 50 MCG tablet Take 1 tablet (50 mcg total) by mouth daily before breakfast.   . loratadine (CLARITIN) 10 MG tablet TAKE 1 TABLET BY MOUTH EVERY DAY FOR ALLERGIES AS NEEDED (Patient taking differently: Take 10 mg by mouth daily as needed for allergies. )   . losartan (COZAAR) 100 MG tablet Take 100 mg by mouth daily. 07/17/2019: .  . meclizine (ANTIVERT) 12.5 MG tablet Take 12.5 mg by mouth 3 (three) times daily as needed for dizziness.  07/17/2019: .  . mirtazapine (REMERON) 15 MG tablet TAKE 1/2 OF A TABLET (7.5 MG TOTAL) BY MOUTH AT BEDTIME.   Marland Kitchen oxyCODONE-acetaminophen (PERCOCET/ROXICET) 5-325 MG tablet Take 1 tablet by mouth every 6 (six) hours as needed for moderate pain.   . OXYGEN Inhale 3 L into the lungs.   . pantoprazole (PROTONIX) 40 MG tablet Take 1 tablet (40 mg total) by mouth 2 (two) times daily.   . sucralfate (  CARAFATE) 1 g tablet Take 1 tablet (1 g total) by mouth 4 (four) times daily -  with meals and at bedtime.   . [DISCONTINUED] aspirin 325 MG tablet Take 1 tablet (325 mg total) by mouth daily.   . [DISCONTINUED] clopidogrel (PLAVIX) 75 MG tablet Take 1 tablet (75 mg total) by mouth daily.   . [DISCONTINUED] mirtazapine (REMERON) 15 MG tablet TAKE 1/2 OF A TABLET (7.5 MG TOTAL) BY MOUTH AT BEDTIME.   . ferrous sulfate 325 (65 FE) MG tablet Take 1 tablet (325 mg total) by mouth 2 (two) times daily with a meal.   . [DISCONTINUED] ferrous sulfate 325 (65 FE) MG tablet Take 1  tablet (325 mg total) by mouth 2 (two) times daily with a meal.    No facility-administered encounter medications on file as of 08/23/2019.    Surgical History: Past Surgical History:  Procedure Laterality Date  . CAROTID ENDARTERECTOMY Right 08/01/2019  . ENDARTERECTOMY Right 08/01/2019   Procedure: ENDARTERECTOMY CAROTID;  Surgeon: Marty Heck, MD;  Location: [place];  Service: Vascular;  Laterality: Right;  . FRACTURE SURGERY     right ankle  . GALLBLADDER SURGERY  2012  . IR ARCH CERVICICEBRAL NON-SEL (MS)  07/19/2019  . MULTIPLE TOOTH EXTRACTIONS    . PATCH ANGIOPLASTY Right 08/01/2019   Procedure: Patch Angioplasty;  Surgeon: Marty Heck, MD;  Location: [place];  Service: Vascular;  Laterality: Right;  . TUBAL LIGATION      Medical History: Past Medical History:  Diagnosis Date  . Atherosclerotic heart disease 06/12/2015  . CHF (congestive heart failure) ([place])   . Chronic congestive heart failure with left ventricular diastolic dysfunction ([place]) 04/12/2018  . CKD (chronic kidney disease) stage 2, GFR 60-89 ml/min 06/12/2015  . COPD (chronic obstructive pulmonary disease) ([place])   . Full dentures   . Hypercalcemia 06/12/2015  . Hyperlipidemia 06/12/2015  . Hypertension 06/12/2015  . Hypothyroidism 06/12/2015  . Insomnia 06/12/2015  . Interstitial pneumonia ([place]) 04/12/2018  . On supplemental oxygen by nasal cannula    at HS and PRN  . 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 Shortness of breath 06/12/2015  . Solitary pulmonary nodule 06/12/2015  . Wears glasses     Family History: Family History  Problem Relation Age of Onset  . Cancer Mother   . Hypertension Father   . Diabetes Father   . Cancer Father     Social History   Socioeconomic History  . Marital status: Married    Spouse name: Daryl  . Number of children: 3  . Years of education: 12  . Highest education level: 12th grade  Occupational History  . Not on file  Tobacco Use  .  Smoking status: Former Smoker    Types: Cigarettes    Quit date: 2013    Years since quitting: 8.0  . Smokeless tobacco: Never Used  Substance and Sexual Activity  . Alcohol use: No    Alcohol/week: 0.0 standard drinks  . Drug use: No  . Sexual activity: Yes    Birth control/protection: None  Other Topics Concern  . Not on file  Social History Narrative  . Not on file   Social Determinants of Health   Financial Resource Strain:   . Difficulty of Paying Living Expenses: Not on file  Food Insecurity:   . Worried About Charity fundraiser in the Last Year: Not on file  . Ran Out of Food in the Last Year: Not on file  Transportation Needs:   . Film/video editor (Medical): Not on file  . Lack of Transportation (Non-Medical): Not on file  Physical Activity:   . Days of Exercise per Week: Not on file  . Minutes of Exercise per Session: Not on file  Stress:   . Feeling of Stress : Not on file  Social Connections:   . Frequency of Communication with Friends and Family: Not on file  . Frequency of Social Gatherings with Friends and Family: Not on file  . Attends Religious Services: Not on file  . Active Member of Clubs or Organizations: Not on file  . Attends Archivist Meetings: Not on file  . Marital Status: Not on file  Intimate Partner Violence:   . Fear of Current or Ex-Partner: Not on file  . Emotionally Abused: Not on file  . Physically Abused: Not on file  . Sexually Abused: Not on file      Review of Systems  Constitutional: Negative for chills, fatigue and unexpected weight change.  HENT: Negative for congestion, postnasal drip, rhinorrhea, sneezing and sore throat.   Eyes: Negative for redness.  Respiratory: Positive for shortness of breath. Negative for cough and chest tightness.        On oxygen via nasal cannula at all times.   Cardiovascular: Negative for chest pain and palpitations.  Gastrointestinal: Negative for abdominal pain,  constipation, diarrhea, nausea and vomiting.  Musculoskeletal: Negative for arthralgias, back pain, joint swelling and neck pain.  Skin: Negative for rash.  Neurological: Negative.  Negative for tremors and numbness.  Hematological: Negative for adenopathy. Does not bruise/bleed easily.  Psychiatric/Behavioral: Negative for behavioral problems (Depression), sleep disturbance and suicidal ideas. The patient is not nervous/anxious.     Today's Vitals   08/23/19 1109  Height: 5\' 1"  (1.549 m)   Body mass index is 26.83 kg/m.  Observation/Objective:   The patient is alert and oriented. She is pleasant and answers all questions appropriately. Breathing is non-labored. She is in no acute distress at this time.    Assessment/Plan: 1. Cerebrovascular disease Reviewed hospital records, labs, imaging, and progress notes with the patient. Will continue plavix and aspirin daily. Refills were provided for both today.  - clopidogrel (PLAVIX) 75 MG tablet; Take 1 tablet (75 mg total) by mouth daily.  Dispense: 90 tablet; Refill: 1 - aspirin 325 MG tablet; Take 1 tablet (325 mg total) by mouth daily.  Dispense: 90 tablet; Refill: 1  2. Major depression, chronic Continue remeron 15mg  tablets, taking 1/2 tablet every evening. Refills were sent today.  - mirtazapine (REMERON) 15 MG tablet; TAKE 1/2 OF A TABLET (7.5 MG TOTAL) BY MOUTH AT BEDTIME.  Dispense: 45 tablet; Refill: 1  3. Iron deficiency anemia, unspecified iron deficiency anemia type Patient iron deficient during hospital stay. Continue ferrous sulfate 325mg  twice daily. Refills provided today  - ferrous sulfate 325 (65 FE) MG tablet; Take 1 tablet (325 mg total) by mouth 2 (two) times daily with a meal.  Dispense: 180 tablet; Refill: 1  4. Obstructive chronic bronchitis without exacerbation ([place]) Continue inhalers as prescribed .  5. Oxygen dependent conitnue nasal cannula oxygen at all times.   General Counseling: Sheniah verbalizes  understanding of the findings of today's phone visit and agrees with plan of treatment. I have discussed any further diagnostic evaluation that may be needed or ordered today. We also reviewed her medications today. she has been encouraged to call the office with any questions or concerns that should arise  related to todays visit.   This patient was seen by [place] with Dr Lavera Guise as a part of collaborative care agreement  Meds ordered this encounter  Medications  . clopidogrel (PLAVIX) 75 MG tablet    Sig: Take 1 tablet (75 mg total) by mouth daily.    Dispense:  90 tablet    Refill:  1    Order Specific Question:   Supervising Provider    Answer:   Lavera Guise [[id number]]  . aspirin 325 MG tablet    Sig: Take 1 tablet (325 mg total) by mouth daily.    Dispense:  90 tablet    Refill:  1    Order Specific Question:   Supervising Provider    Answer:   Lavera Guise [[id number]]  . ferrous sulfate 325 (65 FE) MG tablet    Sig: Take 1 tablet (325 mg total) by mouth 2 (two) times daily with a meal.    Dispense:  180 tablet    Refill:  1    Order Specific Question:   Supervising Provider    Answer:   Lavera Guise [place]  . mirtazapine (REMERON) 15 MG tablet    Sig: TAKE 1/2 OF A TABLET (7.5 MG TOTAL) BY MOUTH AT BEDTIME.    Dispense:  45 tablet    Refill:  1    Order Specific Question:   Supervising Provider    Answer:   Lavera Guise [[id number]]    Time spent: 31 Minutes    Dr Lavera Guise Internal medicine

## 2019-08-28 NOTE — Progress Notes (Deleted)
Referring Provider: Ronnell Freshwater, NP Primary Care Physician:  Ronnell Freshwater, NP  Reason for Consultation:  Anemia   IMPRESSION:  ***  PLAN: ***  Please see the "Patient Instructions" section for addition details about the plan.  HPI: Melissa Knox is a 68 y.o. 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 Adjacent mild RP LAD was also seen. 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 FNB was performed. Despite the concerning appearance, the cytology showed benign pancreatic acinar tissue. She was scheduled for an MRCP in early July; however, she was not made aware of this appt and failed to present.  At the current time, she reports having no abdominal pain. Her weight is actually up 2 pounds since June. She has not been trying to lose or gain weight. She denies episodes of pancreatitis. Her stools are normal though there is some blood on the toilet tissue intermittently. She's never had a colonoscopy. No family history of CRC or pancreatic cancer.   Last visit with [facility] in 2018 recommended MRI/MRCP and colonoscopy.   EGD and EUS at [facility] to evaluate pancreatic fullness Inpatient GI consultation Dr. Allen Norris at [facility] 02/07/17: Abdominal pain due to DU and CT scan showing ischemic colitis. Hemoglobin was 6.8 at that time.   No known family history of colon cancer or polyps. No family history of  uterine/endometrial cancer, pancreatic cancer or gastric/stomach cancer.   Past Medical History:  Diagnosis Date  . Atherosclerotic heart disease 06/12/2015  . CHF (congestive heart failure) ([place])   . Chronic congestive heart failure with left ventricular diastolic dysfunction ([place]) 04/12/2018  . CKD (chronic kidney disease) stage 2, GFR 60-89 ml/min 06/12/2015  . COPD (chronic obstructive pulmonary disease) ([place])   . Full dentures   . Hypercalcemia 06/12/2015  . Hyperlipidemia 06/12/2015  . Hypertension 06/12/2015  . Hypothyroidism 06/12/2015  . Insomnia 06/12/2015  . Interstitial pneumonia ([place]) 04/12/2018  . On supplemental oxygen by nasal cannula    at HS and PRN  . 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 Shortness of breath 06/12/2015  . Solitary pulmonary nodule 06/12/2015  . Wears glasses     Past Surgical History:  Procedure Laterality Date  . CAROTID ENDARTERECTOMY Right 08/01/2019  . ENDARTERECTOMY Right 08/01/2019   Procedure: ENDARTERECTOMY CAROTID;  Surgeon: Marty Heck, MD;  Location: [place];  Service: Vascular;  Laterality: Right;  . FRACTURE SURGERY     right ankle  . GALLBLADDER SURGERY  2012  . IR ARCH CERVICICEBRAL NON-SEL (MS)  07/19/2019  . MULTIPLE TOOTH EXTRACTIONS    . PATCH ANGIOPLASTY Right 08/01/2019   Procedure: Patch Angioplasty;  Surgeon: Marty Heck, MD;  Location: [place];  Service: Vascular;  Laterality: Right;  . TUBAL LIGATION      Current Outpatient Medications  Medication Sig Dispense Refill  . albuterol (PROVENTIL) (2.5 MG/3ML) 0.083% nebulizer solution Take 2.5 mg by nebulization every 6 (six) hours as needed for wheezing or shortness of breath.    Marland Kitchen  amLODipine (NORVASC) 10 MG tablet Take 1 tablet (10 mg total) by mouth daily. 30 tablet 5  . aspirin 325 MG tablet Take 1 tablet (325 mg total) by mouth daily. 90 tablet 1  . atorvastatin (LIPITOR) 10 MG tablet Take 1 tablet (10 mg total) by mouth daily at 6 PM. 30 tablet  5  . budesonide-formoterol (SYMBICORT) 160-4.5 MCG/ACT inhaler Inhale 2 puffs into the lungs 2 (two) times daily. 1 Inhaler 2  . carvedilol (COREG) 6.25 MG tablet Take 1 tablet (6.25 mg total) by mouth 2 (two) times daily. 60 tablet 5  . clopidogrel (PLAVIX) 75 MG tablet Take 1 tablet (75 mg total) by mouth daily. 90 tablet 1  . docusate sodium (COLACE) 100 MG capsule Take 1 capsule (100 mg total) by mouth 2 (two) times daily as needed for mild constipation. 10 capsule 0  . ferrous sulfate 325 (65 FE) MG tablet Take 1 tablet (325 mg total) by mouth 2 (two) times daily with a meal. 180 tablet 1  . furosemide (LASIX) 20 MG tablet TAKE 1 TABLET BY MOUTH EVERY DAY 30 tablet 6  . levothyroxine (SYNTHROID) 50 MCG tablet Take 1 tablet (50 mcg total) by mouth daily before breakfast. 90 tablet 0  . loratadine (CLARITIN) 10 MG tablet TAKE 1 TABLET BY MOUTH EVERY DAY FOR ALLERGIES AS NEEDED (Patient taking differently: Take 10 mg by mouth daily as needed for allergies. ) 90 tablet 0  . losartan (COZAAR) 100 MG tablet Take 100 mg by mouth daily.    . meclizine (ANTIVERT) 12.5 MG tablet Take 12.5 mg by mouth 3 (three) times daily as needed for dizziness.     . mirtazapine (REMERON) 15 MG tablet TAKE 1/2 OF A TABLET (7.5 MG TOTAL) BY MOUTH AT BEDTIME. 45 tablet 1  . oxyCODONE-acetaminophen (PERCOCET/ROXICET) 5-325 MG tablet Take 1 tablet by mouth every 6 (six) hours as needed for moderate pain. 20 tablet 0  . OXYGEN Inhale 3 L into the lungs.    . pantoprazole (PROTONIX) 40 MG tablet Take 1 tablet (40 mg total) by mouth 2 (two) times daily. 60 tablet 5  . sucralfate (CARAFATE) 1 g tablet Take 1 tablet (1 g total) by mouth 4 (four) times daily -  with meals and at bedtime. 120 tablet 1   No current facility-administered medications for this visit.    Allergies as of 08/29/2019  . (No Known Allergies)    Family History  Problem Relation Age of Onset  . Cancer Mother   . Hypertension Father   . Diabetes  Father   . Cancer Father     Social History   Socioeconomic History  . Marital status: Married    Spouse name: Daryl  . Number of children: 3  . Years of education: 17  . Highest education level: 12th grade  Occupational History  . Not on file  Tobacco Use  . Smoking status: Former Smoker    Types: Cigarettes    Quit date: 2013    Years since quitting: 7.9  . Smokeless tobacco: Never Used  Substance and Sexual Activity  . Alcohol use: No    Alcohol/week: 0.0 standard drinks  . Drug use: No  . Sexual activity: Yes    Birth control/protection: None  Other Topics Concern  . Not on file  Social History Narrative  . Not on file   Social Determinants of Health   Financial Resource Strain:   . Difficulty of Paying Living Expenses: Not on file  Food Insecurity:   . Worried About Charity fundraiser in the Last Year: Not on file  . Ran Out of Food in the Last Year: Not on file  Transportation Needs:   . Lack of Transportation (Medical): Not on file  . Lack of Transportation (Non-Medical): Not on file  Physical Activity:   . Days of Exercise per Week: Not on file  . Minutes of Exercise per Session: Not on file  Stress:   . Feeling of Stress : Not on file  Social Connections:   . Frequency of Communication with Friends and Family: Not on file  . Frequency of Social Gatherings with Friends and Family: Not on file  . Attends Religious Services: Not on file  . Active Member of Clubs or Organizations: Not on file  . Attends Archivist Meetings: Not on file  . Marital Status: Not on file  Intimate Partner Violence:   . Fear of Current or Ex-Partner: Not on file  . Emotionally Abused: Not on file  . Physically Abused: Not on file  . Sexually Abused: Not on file    Review of Systems: 12 system ROS is negative except as noted above.   Physical Exam: General:   Alert,  well-nourished, pleasant and cooperative in NAD Head:  Normocephalic and atraumatic. Eyes:   Sclera clear, no icterus.   Conjunctiva pink. Ears:  Normal auditory acuity. Nose:  No deformity, discharge,  or lesions. Mouth:  No deformity or lesions.   Neck:  Supple; no masses or thyromegaly. Lungs:  Clear throughout to auscultation.   No wheezes. Heart:  Regular rate and rhythm; no murmurs. Abdomen:  Soft,nontender, nondistended, normal bowel sounds, no rebound or guarding. No hepatosplenomegaly.   Rectal:  Deferred  Msk:  Symmetrical. No boney deformities LAD: No inguinal or umbilical LAD Extremities:  No clubbing or edema. Neurologic:  Alert and  oriented x4;  grossly nonfocal Skin:  Intact without significant lesions or rashes. Psych:  Alert and cooperative. Normal mood and affect.    Cletis Clack L. Tarri Glenn, MD, MPH 08/28/2019, 9:25 PM

## 2019-08-29 ENCOUNTER — Ambulatory Visit: Payer: Medicare Other | Admitting: Gastroenterology

## 2019-08-30 ENCOUNTER — Telehealth: Payer: Self-pay

## 2019-08-30 NOTE — Telephone Encounter (Signed)
CONFIRMED AND SCREENED FOR 09-04-19 OV. 

## 2019-09-01 DIAGNOSIS — I679 Cerebrovascular disease, unspecified: Secondary | ICD-10-CM | POA: Insufficient documentation

## 2019-09-04 ENCOUNTER — Other Ambulatory Visit: Payer: Self-pay

## 2019-09-04 ENCOUNTER — Ambulatory Visit (INDEPENDENT_AMBULATORY_CARE_PROVIDER_SITE_OTHER): Payer: Medicare Other | Admitting: Nurse Practitioner

## 2019-09-04 ENCOUNTER — Encounter: Payer: Self-pay | Admitting: Nurse Practitioner

## 2019-09-04 VITALS — BP 142/54 | HR 92 | Resp 16 | Ht 60.0 in | Wt 143.8 lb

## 2019-09-04 DIAGNOSIS — Z9981 Dependence on supplemental oxygen: Secondary | ICD-10-CM | POA: Diagnosis not present

## 2019-09-04 DIAGNOSIS — I6521 Occlusion and stenosis of right carotid artery: Secondary | ICD-10-CM

## 2019-09-04 DIAGNOSIS — I679 Cerebrovascular disease, unspecified: Secondary | ICD-10-CM | POA: Diagnosis not present

## 2019-09-04 DIAGNOSIS — Z0001 Encounter for general adult medical examination with abnormal findings: Secondary | ICD-10-CM | POA: Diagnosis not present

## 2019-09-04 DIAGNOSIS — R300 Dysuria: Secondary | ICD-10-CM | POA: Diagnosis not present

## 2019-09-04 DIAGNOSIS — J449 Chronic obstructive pulmonary disease, unspecified: Secondary | ICD-10-CM

## 2019-09-04 MED ORDER — LEVOTHYROXINE SODIUM 50 MCG PO TABS: 50.0000 ug | ORAL_TABLET | Freq: Every day | ORAL | 1 refills | Status: DC

## 2019-09-04 NOTE — Progress Notes (Signed)
[facility] [place], [place] [address]  Internal MEDICINE  Office Visit Note  Patient Name: Melissa Knox  [id number]  [id number]  Date of Service: 09/08/2019  Chief Complaint  Patient presents with  . Medicare Wellness  . Hypertension  . Hyperlipidemia  . Hypothyroidism    Melissa Knox presents today for a wellness exam. She denies any specific complaints or the need for any medication refills. She states, "I'm feeling good." she was recently hospitalized due to ischemic stroke effecting the vision in her right eye. She had endarterectomy done, just prior to christmas. She continues to heal well  She is using her oxygen at all times.       Current Medication: Outpatient Encounter Medications as of 09/04/2019  Medication Sig Note  . albuterol (PROVENTIL) (2.5 MG/3ML) 0.083% nebulizer solution Take 2.5 mg by nebulization every 6 (six) hours as needed for wheezing or shortness of breath.   Marland Kitchen amLODipine (NORVASC) 10 MG tablet Take 1 tablet (10 mg total) by mouth daily.   Marland Kitchen aspirin 325 MG tablet Take 1 tablet (325 mg total) by mouth daily.   Marland Kitchen atorvastatin (LIPITOR) 10 MG tablet Take 1 tablet (10 mg total) by mouth daily at 6 PM.   . budesonide-formoterol (SYMBICORT) 160-4.5 MCG/ACT inhaler Inhale 2 puffs into the lungs 2 (two) times daily.   . carvedilol (COREG) 6.25 MG tablet Take 1 tablet (6.25 mg total) by mouth 2 (two) times daily.   . clopidogrel (PLAVIX) 75 MG tablet Take 1 tablet (75 mg total) by mouth daily.   Marland Kitchen docusate sodium (COLACE) 100 MG capsule Take 1 capsule (100 mg total) by mouth 2 (two) times daily as needed for mild constipation. 07/17/2019: .  Marland Kitchen ferrous sulfate 325 (65 FE) MG tablet Take 1 tablet (325 mg total) by mouth 2 (two) times daily with a meal.   . furosemide (LASIX) 20 MG tablet TAKE 1 TABLET BY MOUTH EVERY DAY   . levothyroxine (SYNTHROID) 50 MCG tablet Take 1 tablet (50 mcg total) by mouth daily before breakfast.   .  loratadine (CLARITIN) 10 MG tablet TAKE 1 TABLET BY MOUTH EVERY DAY FOR ALLERGIES AS NEEDED (Patient taking differently: Take 10 mg by mouth daily as needed for allergies. )   . losartan (COZAAR) 100 MG tablet Take 100 mg by mouth daily. 07/17/2019: .  . meclizine (ANTIVERT) 12.5 MG tablet Take 12.5 mg by mouth 3 (three) times daily as needed for dizziness.  07/17/2019: .  . mirtazapine (REMERON) 15 MG tablet TAKE 1/2 OF A TABLET (7.5 MG TOTAL) BY MOUTH AT BEDTIME.   Marland Kitchen oxyCODONE-acetaminophen (PERCOCET/ROXICET) 5-325 MG tablet Take 1 tablet by mouth every 6 (six) hours as needed for moderate pain.   . OXYGEN Inhale 3 L into the lungs.   . pantoprazole (PROTONIX) 40 MG tablet Take 1 tablet (40 mg total) by mouth 2 (two) times daily.   . sucralfate (CARAFATE) 1 g tablet Take 1 tablet (1 g total) by mouth 4 (four) times daily -  with meals and at bedtime.    No facility-administered encounter medications on file as of 09/04/2019.    Surgical History: Past Surgical History:  Procedure Laterality Date  . CAROTID ENDARTERECTOMY Right 08/01/2019  . ENDARTERECTOMY Right 08/01/2019   Procedure: ENDARTERECTOMY CAROTID;  Surgeon: Marty Heck, MD;  Location: [place];  Service: Vascular;  Laterality: Right;  . FRACTURE SURGERY     right ankle  . GALLBLADDER SURGERY  2012  .  IR ARCH CERVICICEBRAL NON-SEL (MS)  07/19/2019  . MULTIPLE TOOTH EXTRACTIONS    . PATCH ANGIOPLASTY Right 08/01/2019   Procedure: Patch Angioplasty;  Surgeon: Marty Heck, MD;  Location: [place];  Service: Vascular;  Laterality: Right;  . TUBAL LIGATION      Medical History: Past Medical History:  Diagnosis Date  . Atherosclerotic heart disease 06/12/2015  . CHF (congestive heart failure) ([place])   . Chronic congestive heart failure with left ventricular diastolic dysfunction ([place]) 04/12/2018  . CKD (chronic kidney disease) stage 2, GFR 60-89 ml/min 06/12/2015  . COPD (chronic obstructive pulmonary disease) ([place])   .  Full dentures   . Hypercalcemia 06/12/2015  . Hyperlipidemia 06/12/2015  . Hypertension 06/12/2015  . Hypothyroidism 06/12/2015  . Insomnia 06/12/2015  . Interstitial pneumonia ([place]) 04/12/2018  . On supplemental oxygen by nasal cannula    at HS and PRN  . 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 Shortness of breath 06/12/2015  . Solitary pulmonary nodule 06/12/2015  . Wears glasses     Family History: Family History  Problem Relation Age of Onset  . Cancer Mother   . Hypertension Father   . Diabetes Father   . Cancer Father     Social History   Socioeconomic History  . Marital status: Married    Spouse name: Daryl  . Number of children: 3  . Years of education: 16  . Highest education level: 12th grade  Occupational History  . Not on file  Tobacco Use  . Smoking status: Former Smoker    Types: Cigarettes    Quit date: 2013    Years since quitting: 8.0  . Smokeless tobacco: Never Used  Substance and Sexual Activity  . Alcohol use: No    Alcohol/week: 0.0 standard drinks  . Drug use: No  . Sexual activity: Yes    Birth control/protection: None  Other Topics Concern  . Not on file  Social History Narrative  . Not on file   Social Determinants of Health   Financial Resource Strain:   . Difficulty of Paying Living Expenses: Not on file  Food Insecurity:   . Worried About Charity fundraiser in the Last Year: Not on file  . Ran Out of Food in the Last Year: Not on file  Transportation Needs:   . Lack of Transportation (Medical): Not on file  . Lack of Transportation (Non-Medical): Not on file  Physical Activity:   . Days of Exercise per Week: Not on file  . Minutes of Exercise per Session: Not on file  Stress:   . Feeling of Stress : Not on file  Social Connections:   . Frequency of Communication with Friends and Family: Not on file  . Frequency of Social Gatherings with Friends and Family: Not on file  . Attends Religious Services: Not on file  .  Active Member of Clubs or Organizations: Not on file  . Attends Archivist Meetings: Not on file  . Marital Status: Not on file  Intimate Partner Violence:   . Fear of Current or Ex-Partner: Not on file  . Emotionally Abused: Not on file  . Physically Abused: Not on file  . Sexually Abused: Not on file      Review of Systems  Constitutional: Negative for appetite change, chills, fatigue, fever and unexpected weight change.  HENT: Negative for congestion, postnasal drip, rhinorrhea, sinus pressure, sinus pain, sore throat and tinnitus.        Rhinorrhea  with nasal cannula use  Eyes: Positive for visual disturbance. Negative for photophobia and pain.       Decreased vision in right eye s/p cerebrovascular incident  Respiratory: Positive for cough. Negative for shortness of breath and wheezing.        Intermittent productive cough. Shortness of breath with exertion. Using oxygen at all times.   Cardiovascular: Negative for chest pain and palpitations.  Gastrointestinal: Negative for abdominal pain, constipation, diarrhea, nausea and vomiting.  Endocrine: Negative for polydipsia.  Genitourinary: Negative for difficulty urinating.  Musculoskeletal: Positive for back pain. Negative for joint swelling and myalgias.       Ongoing intermittent back pain with prolonged standing  Skin: Positive for wound. Negative for rash.       Right neck surgical incision, healing  Allergic/Immunologic: Negative for environmental allergies.  Neurological: Positive for numbness. Negative for dizziness, light-headedness and headaches.       Right ear numbness s/p endarterectomy  Hematological: Negative for adenopathy.  Psychiatric/Behavioral: Negative for confusion, dysphoric mood and sleep disturbance.    Today's Vitals   09/04/19 1123  BP: (!) 142/54  Pulse: 92  Resp: 16  SpO2: 97%  Weight: 143 lb 12.8 oz (65.2 kg)  Height: 5' (1.524 m)   Body mass index is 28.08 kg/m.  Physical Exam  Vitals and nursing note reviewed.  Constitutional:      General: She is not in acute distress.    Appearance: Normal appearance.  HENT:     Head: Normocephalic and atraumatic.     Right Ear: There is impacted cerumen.     Left Ear: There is impacted cerumen.     Nose: Nose normal.  Eyes:     General: Lids are normal.     Pupils: Pupils are equal, round, and reactive to light.  Neck:     Trachea: Trachea normal.      Comments: Well healing endarterectomy scar on right side of the neck.  Cardiovascular:     Rate and Rhythm: Normal rate and regular rhythm.     Pulses: Normal pulses.     Heart sounds: Normal heart sounds.  Pulmonary:     Effort: Pulmonary effort is normal.     Breath sounds: Normal breath sounds.     Comments: Patient using nasal cannula oxygen at all times Abdominal:     General: Bowel sounds are normal. There is no distension.     Palpations: Abdomen is soft. There is no mass.     Tenderness: There is no abdominal tenderness.     Hernia: No hernia is present.  Musculoskeletal:        General: No swelling or tenderness. Normal range of motion.     Right shoulder: Normal.     Left shoulder: Normal.     Right upper arm: Normal.     Left upper arm: Normal.     Cervical back: Full passive range of motion without pain, normal range of motion and neck supple. No tenderness.  Skin:    General: Skin is warm and dry.     Capillary Refill: Capillary refill takes 2 to 3 seconds.  Neurological:     Mental Status: She is alert and oriented to person, place, and time. Mental status is at baseline.     Deep Tendon Reflexes:     Reflex Scores:      Patellar reflexes are 2+ on the right side and 2+ on the left side. Psychiatric:  Mood and Affect: Mood normal.        Behavior: Behavior normal.        Thought Content: Thought content normal.        Judgment: Judgment normal.    Depression screen [facility] 2/9 09/04/2019 08/23/2019 02/15/2019 09/15/2018 06/22/2018   Decreased Interest 0 0 0 0 0  Down, Depressed, Hopeless 0 0 0 0 0  PHQ - 2 Score 0 0 0 0 0    Functional Status Survey: Is the patient deaf or have difficulty hearing?: No Does the patient have difficulty seeing, even when wearing glasses/contacts?: No Does the patient have difficulty concentrating, remembering, or making decisions?: No Does the patient have difficulty walking or climbing stairs?: Yes(shortness of breath) Does the patient have difficulty dressing or bathing?: No Does the patient have difficulty doing errands alone such as visiting a doctor's office or shopping?: No  MMSE - [place] Exam 09/04/2019 07/17/2018  Orientation to time 5 5  Orientation to Place 5 5  Registration 3 3  Attention/ Calculation 5 5  Recall 3 3  Language- name 2 objects 2 2  Language- repeat 1 1  Language- follow 3 step command 3 3  Language- read & follow direction 1 1  Write a sentence 1 1  Copy design 1 0  Total score 30 29    Fall Risk  09/04/2019 08/23/2019 02/15/2019 11/27/2018 10/17/2018  Falls in the past year? 0 0 0 (No Data) (No Data)  Comment - - - Patient denies new/ recent falls Patient denies new/ recent falls  Number falls in past yr: - - - - -  Comment - - - - -  Injury with Fall? - - - - -  Risk for fall due to : - - - - -  Risk for fall due to: Comment - - - - -  Follow up - - - Falls prevention discussed Falls prevention discussed   Assessment/Plan:  1. Encounter for general adult medical examination with abnormal findings Annual health maintenance exam.   2. Cerebrovascular disease Stable. Continue regular visits with neurology/vascular surgery as scheduled.   3. Carotid artery stenosis, symptomatic, right Well-healing endarterectomy scar. Will continue to monitor   4. Obstructive chronic bronchitis without exacerbation (HCC) Stable. Continue inhalers and respiratory medication as prescribed   5. Oxygen dependent Continue to use oxygen therapy as  prescribed .  6. Dysuria - UA/M w/rflx Culture, Routine   General Counseling: Colbi verbalizes understanding of the findings of todays visit and agrees with plan of treatment. I have discussed any further diagnostic evaluation that may be needed or ordered today. We also reviewed her medications today. she has been encouraged to call the office with any questions or concerns that should arise related to todays visit.  Hypertension Counseling:   The following hypertensive lifestyle modification were recommended and discussed:  1. Limiting alcohol intake to less than 1 oz/day of ethanol:(24 oz of beer or 8 oz of wine or 2 oz of 100-proof whiskey). 2. Take baby ASA 81 mg daily. 3. Importance of regular aerobic exercise and losing weight. 4. Reduce dietary saturated fat and cholesterol intake for overall cardiovascular health. 5. Maintaining adequate dietary potassium, calcium, and magnesium intake. 6. Regular monitoring of the blood pressure. 7. Reduce sodium intake to less than 100 mmol/day (less than 2.3 gm of sodium or less than 6 gm of sodium choride)   This patient was seen by Gila with Dr Lavera Guise  as a part of collaborative care agreement  Orders Placed This Encounter  Procedures  . UA/M w/rflx Culture, Routine      Total Time spent:45 Minutes      Dr Lavera Guise Internal medicine

## 2019-09-06 ENCOUNTER — Other Ambulatory Visit: Payer: Self-pay | Admitting: *Deleted

## 2019-09-06 DIAGNOSIS — I6523 Occlusion and stenosis of bilateral carotid arteries: Secondary | ICD-10-CM

## 2019-09-08 DIAGNOSIS — R300 Dysuria: Secondary | ICD-10-CM | POA: Insufficient documentation

## 2019-09-08 DIAGNOSIS — Z0001 Encounter for general adult medical examination with abnormal findings: Secondary | ICD-10-CM | POA: Insufficient documentation

## 2019-09-10 ENCOUNTER — Other Ambulatory Visit: Payer: Self-pay

## 2019-09-10 MED ORDER — SUCRALFATE 1 G PO TABS: 1.0000 g | ORAL_TABLET | Freq: Three times a day (TID) | ORAL | 1 refills | Status: DC

## 2019-09-24 ENCOUNTER — Telehealth: Payer: Self-pay

## 2019-09-24 ENCOUNTER — Other Ambulatory Visit: Payer: Self-pay

## 2019-09-24 ENCOUNTER — Other Ambulatory Visit: Payer: Self-pay | Admitting: Nurse Practitioner

## 2019-09-24 DIAGNOSIS — E782 Mixed hyperlipidemia: Secondary | ICD-10-CM

## 2019-09-24 MED ORDER — ATORVASTATIN CALCIUM 10 MG PO TABS: 10.0000 mg | ORAL_TABLET | Freq: Every day | ORAL | 5 refills | Status: DC

## 2019-09-24 NOTE — Progress Notes (Signed)
Refilled atorvastatin and resent to her pharmacy.

## 2019-09-24 NOTE — Telephone Encounter (Signed)
Looks like this was sent 09/24/2019. Did they not get the refill?

## 2019-09-24 NOTE — Telephone Encounter (Signed)
Per patient pharmacy did not have this

## 2019-09-24 NOTE — Telephone Encounter (Signed)
Refilled atorvastatin and resent to her pharmacy.

## 2019-09-25 ENCOUNTER — Telehealth: Payer: Self-pay

## 2019-09-25 NOTE — Telephone Encounter (Signed)
Confirmed virtual visit with patient. klh 

## 2019-09-27 ENCOUNTER — Other Ambulatory Visit: Payer: Self-pay

## 2019-09-27 ENCOUNTER — Encounter: Payer: Self-pay | Admitting: Internal Medicine

## 2019-09-27 ENCOUNTER — Ambulatory Visit (INDEPENDENT_AMBULATORY_CARE_PROVIDER_SITE_OTHER): Payer: Medicare Other | Admitting: Internal Medicine

## 2019-09-27 VITALS — BP 138/54 | Ht 61.0 in | Wt 140.0 lb

## 2019-09-27 DIAGNOSIS — Z9981 Dependence on supplemental oxygen: Secondary | ICD-10-CM

## 2019-09-27 DIAGNOSIS — J449 Chronic obstructive pulmonary disease, unspecified: Secondary | ICD-10-CM | POA: Diagnosis not present

## 2019-09-27 DIAGNOSIS — J849 Interstitial pulmonary disease, unspecified: Secondary | ICD-10-CM

## 2019-09-27 DIAGNOSIS — J9611 Chronic respiratory failure with hypoxia: Secondary | ICD-10-CM | POA: Diagnosis not present

## 2019-09-27 MED ORDER — BUDESONIDE-FORMOTEROL FUMARATE 160-4.5 MCG/ACT IN AERO: 2.0000 | INHALATION_SPRAY | Freq: Two times a day (BID) | RESPIRATORY_TRACT | 2 refills | Status: AC

## 2019-09-27 MED ORDER — ALBUTEROL SULFATE (2.5 MG/3ML) 0.083% IN NEBU: 2.5000 mg | INHALATION_SOLUTION | Freq: Four times a day (QID) | RESPIRATORY_TRACT | 1 refills | Status: AC | PRN

## 2019-09-27 NOTE — Progress Notes (Signed)
[facility] [place], [place] [address]  Internal MEDICINE  Telephone Visit  Patient Name: Bryant Lipps  [id number]  [id number]  Date of Service: 09/27/2019  I connected with the patient at 1203 by telephone and verified the patients identity using two identifiers.   I discussed the limitations, risks, security and privacy concerns of performing an evaluation and management service by telephone and the availability of in person appointments. I also discussed with the patient that there may be a patient responsible charge related to the service.  The patient expressed understanding and agrees to proceed.    Chief Complaint  Patient presents with  . Telephone Assessment  . Telephone Screen  . COPD  . Medication Refill    SYMBICORT    HPI Pt is seen via telephone to follow up on copd. She reports overall she is doing well.  She denies any breathing issues, or hospitalizations.  Her copd is well controlled with Symbicort.  She also was diagnosed with invasive aspergillosis and was unable to afford the medication.  She has an appt with [facility] next month to follow up on this also. She using oxygen 3 liters of oxygen continually.      Current Medication: Outpatient Encounter Medications as of 09/27/2019  Medication Sig Note  . albuterol (PROVENTIL) (2.5 MG/3ML) 0.083% nebulizer solution Take 3 mLs (2.5 mg total) by nebulization every 6 (six) hours as needed for wheezing or shortness of breath.   Marland Kitchen amLODipine (NORVASC) 10 MG tablet Take 1 tablet (10 mg total) by mouth daily.   Marland Kitchen aspirin 325 MG tablet Take 1 tablet (325 mg total) by mouth daily.   Marland Kitchen atorvastatin (LIPITOR) 10 MG tablet Take 1 tablet (10 mg total) by mouth daily at 6 PM.   . budesonide-formoterol (SYMBICORT) 160-4.5 MCG/ACT inhaler Inhale 2 puffs into the lungs 2 (two) times daily.   . carvedilol (COREG) 6.25 MG tablet Take 1 tablet (6.25 mg total) by mouth 2 (two) times daily.   . clopidogrel  (PLAVIX) 75 MG tablet Take 1 tablet (75 mg total) by mouth daily.   Marland Kitchen docusate sodium (COLACE) 100 MG capsule Take 1 capsule (100 mg total) by mouth 2 (two) times daily as needed for mild constipation. 07/17/2019: .  Marland Kitchen ferrous sulfate 325 (65 FE) MG tablet Take 1 tablet (325 mg total) by mouth 2 (two) times daily with a meal.   . furosemide (LASIX) 20 MG tablet TAKE 1 TABLET BY MOUTH EVERY DAY   . levothyroxine (SYNTHROID) 50 MCG tablet Take 1 tablet (50 mcg total) by mouth daily before breakfast.   . loratadine (CLARITIN) 10 MG tablet TAKE 1 TABLET BY MOUTH EVERY DAY FOR ALLERGIES AS NEEDED (Patient taking differently: Take 10 mg by mouth daily as needed for allergies. )   . losartan (COZAAR) 100 MG tablet Take 100 mg by mouth daily. 07/17/2019: .  . meclizine (ANTIVERT) 12.5 MG tablet Take 12.5 mg by mouth 3 (three) times daily as needed for dizziness.  07/17/2019: .  . mirtazapine (REMERON) 15 MG tablet TAKE 1/2 OF A TABLET (7.5 MG TOTAL) BY MOUTH AT BEDTIME.   Marland Kitchen oxyCODONE-acetaminophen (PERCOCET/ROXICET) 5-325 MG tablet Take 1 tablet by mouth every 6 (six) hours as needed for moderate pain.   . OXYGEN Inhale 3 L into the lungs.   . pantoprazole (PROTONIX) 40 MG tablet Take 1 tablet (40 mg total) by mouth 2 (two) times daily.   . sucralfate (CARAFATE) 1 g tablet Take 1  tablet (1 g total) by mouth 4 (four) times daily -  with meals and at bedtime.   . [DISCONTINUED] albuterol (PROVENTIL) (2.5 MG/3ML) 0.083% nebulizer solution Take 2.5 mg by nebulization every 6 (six) hours as needed for wheezing or shortness of breath.   . [DISCONTINUED] budesonide-formoterol (SYMBICORT) 160-4.5 MCG/ACT inhaler Inhale 2 puffs into the lungs 2 (two) times daily.    No facility-administered encounter medications on file as of 09/27/2019.    Surgical History: Past Surgical History:  Procedure Laterality Date  . CAROTID ENDARTERECTOMY Right 08/01/2019  . ENDARTERECTOMY Right 08/01/2019   Procedure: ENDARTERECTOMY  CAROTID;  Surgeon: Marty Heck, MD;  Location: [place];  Service: Vascular;  Laterality: Right;  . FRACTURE SURGERY     right ankle  . GALLBLADDER SURGERY  2012  . IR ARCH CERVICICEBRAL NON-SEL (MS)  07/19/2019  . MULTIPLE TOOTH EXTRACTIONS    . PATCH ANGIOPLASTY Right 08/01/2019   Procedure: Patch Angioplasty;  Surgeon: Marty Heck, MD;  Location: [place];  Service: Vascular;  Laterality: Right;  . TUBAL LIGATION      Medical History: Past Medical History:  Diagnosis Date  . Atherosclerotic heart disease 06/12/2015  . CHF (congestive heart failure) ([place])   . Chronic congestive heart failure with left ventricular diastolic dysfunction ([place]) 04/12/2018  . CKD (chronic kidney disease) stage 2, GFR 60-89 ml/min 06/12/2015  . COPD (chronic obstructive pulmonary disease) ([place])   . Full dentures   . Hypercalcemia 06/12/2015  . Hyperlipidemia 06/12/2015  . Hypertension 06/12/2015  . Hypothyroidism 06/12/2015  . Insomnia 06/12/2015  . Interstitial pneumonia ([place]) 04/12/2018  . On supplemental oxygen by nasal cannula    at HS and PRN  . 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 Shortness of breath 06/12/2015  . Solitary pulmonary nodule 06/12/2015  . Wears glasses     Family History: Family History  Problem Relation Age of Onset  . Cancer Mother   . Hypertension Father   . Diabetes Father   . Cancer Father     Social History   Socioeconomic History  . Marital status: Married    Spouse name: Daryl  . Number of children: 3  . Years of education: 10  . Highest education level: 12th grade  Occupational History  . Not on file  Tobacco Use  . Smoking status: Former Smoker    Types: Cigarettes    Quit date: 2013    Years since quitting: 8.0  . Smokeless tobacco: Never Used  Substance and Sexual Activity  . Alcohol use: No    Alcohol/week: 0.0 standard drinks  . Drug use: No  . Sexual activity: Yes    Birth control/protection: None  Other Topics Concern   . Not on file  Social History Narrative  . Not on file   Social Determinants of Health   Financial Resource Strain:   . Difficulty of Paying Living Expenses: Not on file  Food Insecurity:   . Worried About Charity fundraiser in the Last Year: Not on file  . Ran Out of Food in the Last Year: Not on file  Transportation Needs:   . Lack of Transportation (Medical): Not on file  . Lack of Transportation (Non-Medical): Not on file  Physical Activity:   . Days of Exercise per Week: Not on file  . Minutes of Exercise per Session: Not on file  Stress:   . Feeling of Stress : Not on file  Social Connections:   . Frequency of  Communication with Friends and Family: Not on file  . Frequency of Social Gatherings with Friends and Family: Not on file  . Attends Religious Services: Not on file  . Active Member of Clubs or Organizations: Not on file  . Attends Archivist Meetings: Not on file  . Marital Status: Not on file  Intimate Partner Violence:   . Fear of Current or Ex-Partner: Not on file  . Emotionally Abused: Not on file  . Physically Abused: Not on file  . Sexually Abused: Not on file      Review of Systems  Constitutional: Negative for chills, fatigue and unexpected weight change.  HENT: Negative for congestion, rhinorrhea, sneezing and sore throat.   Eyes: Negative for photophobia, pain and redness.  Respiratory: Positive for shortness of breath. Negative for cough and chest tightness.   Cardiovascular: Negative for chest pain and palpitations.  Gastrointestinal: Negative for abdominal pain, constipation, diarrhea, nausea and vomiting.  Endocrine: Negative.   Genitourinary: Negative for dysuria and frequency.  Musculoskeletal: Negative for arthralgias, back pain, joint swelling and neck pain.  Skin: Negative for rash.  Allergic/Immunologic: Negative.   Neurological: Negative for tremors and numbness.  Hematological: Negative for adenopathy. Does not  bruise/bleed easily.  Psychiatric/Behavioral: Negative for behavioral problems and sleep disturbance. The patient is not nervous/anxious.     Vital Signs: BP (!) 138/54   Ht 5\' 1"  (1.549 m)   Wt 140 lb (63.5 kg)   BMI 26.45 kg/m    Observation/Objective:  Well sounding, speaking in full sentences, nad noted.   Assessment/Plan: 1. Obstructive chronic bronchitis without exacerbation (HCC) Stable, on oxygen. Continue with Symbicort.   2. Chronic respiratory failure with hypoxia (HCC) Stable, continue Symbicort and albuterol nebs as prescribed.  - budesonide-formoterol (SYMBICORT) 160-4.5 MCG/ACT inhaler; Inhale 2 puffs into the lungs 2 (two) times daily.  Dispense: 1 Inhaler; Refill: 2 - albuterol (PROVENTIL) (2.5 MG/3ML) 0.083% nebulizer solution; Take 3 mLs (2.5 mg total) by nebulization every 6 (six) hours as needed for wheezing or shortness of breath.  Dispense: 75 mL; Refill: 1  3. Oxygen dependent Continue oxygen 3 lpm continually.  4. ILD (interstitial lung disease) ([place]) Continue to follow up at [facility] as scheduled.   General Counseling: Zyah verbalizes understanding of the findings of today's phone visit and agrees with plan of treatment. I have discussed any further diagnostic evaluation that may be needed or ordered today. We also reviewed her medications today. she has been encouraged to call the office with any questions or concerns that should arise related to todays visit.    No orders of the defined types were placed in this encounter.   Meds ordered this encounter  Medications  . budesonide-formoterol (SYMBICORT) 160-4.5 MCG/ACT inhaler    Sig: Inhale 2 puffs into the lungs 2 (two) times daily.    Dispense:  1 Inhaler    Refill:  2  . albuterol (PROVENTIL) (2.5 MG/3ML) 0.083% nebulizer solution    Sig: Take 3 mLs (2.5 mg total) by nebulization every 6 (six) hours as needed for wheezing or shortness of breath.    Dispense:  75 mL    Refill:  1    Time  spent: 20 Minutes    Orson Gear AGNP-C Pulmonary medicine.

## 2019-10-05 DIAGNOSIS — H35033 Hypertensive retinopathy, bilateral: Secondary | ICD-10-CM | POA: Diagnosis not present

## 2019-10-05 DIAGNOSIS — H353132 Nonexudative age-related macular degeneration, bilateral, intermediate dry stage: Secondary | ICD-10-CM | POA: Diagnosis not present

## 2019-10-05 DIAGNOSIS — H43822 Vitreomacular adhesion, left eye: Secondary | ICD-10-CM | POA: Diagnosis not present

## 2019-10-05 DIAGNOSIS — H34231 Retinal artery branch occlusion, right eye: Secondary | ICD-10-CM | POA: Diagnosis not present

## 2019-10-08 ENCOUNTER — Other Ambulatory Visit: Payer: Self-pay

## 2019-10-08 MED ORDER — SUCRALFATE 1 G PO TABS: 1.0000 g | ORAL_TABLET | Freq: Three times a day (TID) | ORAL | 3 refills | Status: DC

## 2019-10-11 ENCOUNTER — Other Ambulatory Visit: Payer: Self-pay

## 2019-10-11 DIAGNOSIS — J301 Allergic rhinitis due to pollen: Secondary | ICD-10-CM

## 2019-10-11 DIAGNOSIS — I679 Cerebrovascular disease, unspecified: Secondary | ICD-10-CM

## 2019-10-11 MED ORDER — ASPIRIN 325 MG PO TABS: 325.0000 mg | ORAL_TABLET | Freq: Every day | ORAL | 1 refills | Status: AC

## 2019-10-11 MED ORDER — LORATADINE 10 MG PO TABS: ORAL_TABLET | ORAL | 0 refills | Status: DC

## 2019-11-08 ENCOUNTER — Other Ambulatory Visit: Payer: Self-pay

## 2019-11-08 DIAGNOSIS — I10 Essential (primary) hypertension: Secondary | ICD-10-CM

## 2019-11-08 MED ORDER — AMLODIPINE BESYLATE 10 MG PO TABS: 10.0000 mg | ORAL_TABLET | Freq: Every day | ORAL | 5 refills | Status: DC

## 2019-11-20 ENCOUNTER — Telehealth: Payer: Self-pay

## 2019-11-20 NOTE — Telephone Encounter (Signed)
Confirmed appointment on 11/22/2019 and screened for covid. klh  °

## 2019-11-22 ENCOUNTER — Ambulatory Visit (INDEPENDENT_AMBULATORY_CARE_PROVIDER_SITE_OTHER): Payer: Medicare Other | Admitting: Internal Medicine

## 2019-11-22 ENCOUNTER — Other Ambulatory Visit: Payer: Self-pay

## 2019-11-22 ENCOUNTER — Encounter: Payer: Self-pay | Admitting: Internal Medicine

## 2019-11-22 VITALS — BP 152/61 | HR 103 | Temp 97.4°F | Resp 16 | Ht 61.0 in | Wt 141.0 lb

## 2019-11-22 DIAGNOSIS — J849 Interstitial pulmonary disease, unspecified: Secondary | ICD-10-CM | POA: Diagnosis not present

## 2019-11-22 DIAGNOSIS — J9611 Chronic respiratory failure with hypoxia: Secondary | ICD-10-CM

## 2019-11-22 DIAGNOSIS — J449 Chronic obstructive pulmonary disease, unspecified: Secondary | ICD-10-CM | POA: Diagnosis not present

## 2019-11-22 DIAGNOSIS — I6523 Occlusion and stenosis of bilateral carotid arteries: Secondary | ICD-10-CM

## 2019-11-22 DIAGNOSIS — I10 Essential (primary) hypertension: Secondary | ICD-10-CM | POA: Diagnosis not present

## 2019-11-22 DIAGNOSIS — B449 Aspergillosis, unspecified: Secondary | ICD-10-CM | POA: Diagnosis not present

## 2019-11-22 DIAGNOSIS — R0602 Shortness of breath: Secondary | ICD-10-CM | POA: Diagnosis not present

## 2019-11-22 DIAGNOSIS — F329 Major depressive disorder, single episode, unspecified: Secondary | ICD-10-CM

## 2019-11-22 DIAGNOSIS — Z9981 Dependence on supplemental oxygen: Secondary | ICD-10-CM | POA: Diagnosis not present

## 2019-11-22 DIAGNOSIS — J4489 Other specified chronic obstructive pulmonary disease: Secondary | ICD-10-CM

## 2019-11-22 MED ORDER — CARVEDILOL 6.25 MG PO TABS: 6.2500 mg | ORAL_TABLET | Freq: Two times a day (BID) | ORAL | 5 refills | Status: DC

## 2019-11-22 MED ORDER — MIRTAZAPINE 15 MG PO TABS: ORAL_TABLET | ORAL | 1 refills | Status: AC

## 2019-11-22 NOTE — Progress Notes (Signed)
[facility] [place], [place] [address]  Pulmonary Sleep Medicine   Office Visit Note  Patient Name: Melissa Knox DOB: 1951/07/08 MRN [id number]  Date of Service: 11/22/2019  Complaints/HPI: Pt is here for pulmonary follow up.  She has a history of ILD, chronic respiratory failure, and copd.  She was due to see the ILD clinic at [facility] however she had to reschedule that visit for July. She has been diagnosed with invasive aspergillosis. She denies any new or worrisome symptoms.  She reports her breathing is unchanged.  She continues to use oxygen 2-3 lpm continuously.    ROS  General: (-) fever, (-) chills, (-) night sweats, (-) weakness Skin: (-) rashes, (-) itching,. Eyes: (-) visual changes, (-) redness, (-) itching. Nose and Sinuses: (-) nasal stuffiness or itchiness, (-) postnasal drip, (-) nosebleeds, (-) sinus trouble. Mouth and Throat: (-) sore throat, (-) hoarseness. Neck: (-) swollen glands, (-) enlarged thyroid, (-) neck pain. Respiratory: - cough, (-) bloody sputum, - shortness of breath, - wheezing. Cardiovascular: - ankle swelling, (-) chest pain. Lymphatic: (-) lymph node enlargement. Neurologic: (-) numbness, (-) tingling. Psychiatric: (-) anxiety, (-) depression   Current Medication: Outpatient Encounter Medications as of 11/22/2019  Medication Sig Note  . albuterol (PROVENTIL) (2.5 MG/3ML) 0.083% nebulizer solution Take 3 mLs (2.5 mg total) by nebulization every 6 (six) hours as needed for wheezing or shortness of breath.   Marland Kitchen amLODipine (NORVASC) 10 MG tablet Take 1 tablet (10 mg total) by mouth daily.   Marland Kitchen aspirin 325 MG tablet Take 1 tablet (325 mg total) by mouth daily.   Marland Kitchen atorvastatin (LIPITOR) 10 MG tablet Take 1 tablet (10 mg total) by mouth daily at 6 PM.   . budesonide-formoterol (SYMBICORT) 160-4.5 MCG/ACT inhaler Inhale 2 puffs into the lungs 2 (two) times daily.   . carvedilol (COREG) 6.25 MG tablet Take 1 tablet (6.25 mg  total) by mouth 2 (two) times daily.   . clopidogrel (PLAVIX) 75 MG tablet Take 1 tablet (75 mg total) by mouth daily.   Marland Kitchen docusate sodium (COLACE) 100 MG capsule Take 1 capsule (100 mg total) by mouth 2 (two) times daily as needed for mild constipation. 07/17/2019: .  Marland Kitchen ferrous sulfate 325 (65 FE) MG tablet Take 1 tablet (325 mg total) by mouth 2 (two) times daily with a meal.   . furosemide (LASIX) 20 MG tablet TAKE 1 TABLET BY MOUTH EVERY DAY   . levothyroxine (SYNTHROID) 50 MCG tablet Take 1 tablet (50 mcg total) by mouth daily before breakfast.   . loratadine (CLARITIN) 10 MG tablet TAKE 1 TABLET BY MOUTH EVERY DAY FOR ALLERGIES AS NEEDED   . losartan (COZAAR) 100 MG tablet Take 100 mg by mouth daily. 07/17/2019: .  . meclizine (ANTIVERT) 12.5 MG tablet Take 12.5 mg by mouth 3 (three) times daily as needed for dizziness.  07/17/2019: .  . mirtazapine (REMERON) 15 MG tablet TAKE 1/2 OF A TABLET (7.5 MG TOTAL) BY MOUTH AT BEDTIME.   Marland Kitchen oxyCODONE-acetaminophen (PERCOCET/ROXICET) 5-325 MG tablet Take 1 tablet by mouth every 6 (six) hours as needed for moderate pain.   . OXYGEN Inhale 3 L into the lungs.   . pantoprazole (PROTONIX) 40 MG tablet Take 1 tablet (40 mg total) by mouth 2 (two) times daily.   . sucralfate (CARAFATE) 1 g tablet Take 1 tablet (1 g total) by mouth 4 (four) times daily -  with meals and at bedtime.    No facility-administered  encounter medications on file as of 11/22/2019.    Surgical History: Past Surgical History:  Procedure Laterality Date  . CAROTID ENDARTERECTOMY Right 08/01/2019  . ENDARTERECTOMY Right 08/01/2019   Procedure: ENDARTERECTOMY CAROTID;  Surgeon: Marty Heck, MD;  Location: [facility];  Service: Vascular;  Laterality: Right;  . FRACTURE SURGERY     right ankle  . GALLBLADDER SURGERY  2012  . IR ARCH CERVICICEBRAL NON-SEL (MS)  07/19/2019  . MULTIPLE TOOTH EXTRACTIONS    . PATCH ANGIOPLASTY Right 08/01/2019   Procedure: Patch Angioplasty;   Surgeon: Marty Heck, MD;  Location: [place];  Service: Vascular;  Laterality: Right;  . TUBAL LIGATION      Medical History: Past Medical History:  Diagnosis Date  . Atherosclerotic heart disease 06/12/2015  . CHF (congestive heart failure) ([place])   . Chronic congestive heart failure with left ventricular diastolic dysfunction ([place]) 04/12/2018  . CKD (chronic kidney disease) stage 2, GFR 60-89 ml/min 06/12/2015  . COPD (chronic obstructive pulmonary disease) ([place])   . Full dentures   . Hypercalcemia 06/12/2015  . Hyperlipidemia 06/12/2015  . Hypertension 06/12/2015  . Hypothyroidism 06/12/2015  . Insomnia 06/12/2015  . Interstitial pneumonia ([place]) 04/12/2018  . On supplemental oxygen by nasal cannula    at HS and PRN  . 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 Shortness of breath 06/12/2015  . Solitary pulmonary nodule 06/12/2015  . Wears glasses     Family History: Family History  Problem Relation Age of Onset  . Cancer Mother   . Hypertension Father   . Diabetes Father   . Cancer Father     Social History: Social History   Socioeconomic History  . Marital status: Married    Spouse name: Daryl  . Number of children: 3  . Years of education: 30  . Highest education level: 12th grade  Occupational History  . Not on file  Tobacco Use  . Smoking status: Former Smoker    Types: Cigarettes    Quit date: 2013    Years since quitting: 8.2  . Smokeless tobacco: Never Used  Substance and Sexual Activity  . Alcohol use: No    Alcohol/week: 0.0 standard drinks  . Drug use: No  . Sexual activity: Yes    Birth control/protection: None  Other Topics Concern  . Not on file  Social History Narrative  . Not on file   Social Determinants of Health   Financial Resource Strain:   . Difficulty of Paying Living Expenses:   Food Insecurity:   . Worried About Charity fundraiser in the Last Year:   . Arboriculturist in the Last Year:   Transportation Needs:   .  Film/video editor (Medical):   Marland Kitchen Lack of Transportation (Non-Medical):   Physical Activity:   . Days of Exercise per Week:   . Minutes of Exercise per Session:   Stress:   . Feeling of Stress :   Social Connections:   . Frequency of Communication with Friends and Family:   . Frequency of Social Gatherings with Friends and Family:   . Attends Religious Services:   . Active Member of Clubs or Organizations:   . Attends Archivist Meetings:   Marland Kitchen Marital Status:   Intimate Partner Violence:   . Fear of Current or Ex-Partner:   . Emotionally Abused:   Marland Kitchen Physically Abused:   . Sexually Abused:     Vital Signs: Blood pressure (!) 152/61, pulse Marland Kitchen)  103, temperature (!) 97.4 F (36.3 C), resp. rate 16, height 5\' 1"  (1.549 m), weight 141 lb (64 kg), SpO2 99 %.  Examination: General Appearance: The patient is well-developed, well-nourished, and in no distress. Skin: Gross inspection of skin unremarkable. Head: normocephalic, no gross deformities. Eyes: no gross deformities noted. ENT: ears appear grossly normal no exudates. Neck: Supple. No thyromegaly. No LAD. Respiratory: course breath sounds faintly in bases. Cardiovascular: Normal S1 and S2 without murmur or rub. Extremities: No cyanosis. pulses are equal. Neurologic: Alert and oriented. No involuntary movements.  LABS: No results found for this or any previous visit (from the past 2160 hour(s)).  Radiology: No results found.  No results found.  No results found.    Assessment and Plan: Patient Active Problem List   Diagnosis Date Noted  . Encounter for general adult medical examination with abnormal findings 09/08/2019  . Dysuria 09/08/2019  . Cerebrovascular disease 09/01/2019  . Carotid artery stenosis, symptomatic, right 08/01/2019  . Acute CVA (cerebrovascular accident) ([place]) 07/17/2019  . Peptic ulcer disease 02/17/2019  . Malnutrition of moderate degree 09/07/2018  . Acute on chronic  respiratory failure with hypoxia ([place]) 09/05/2018  . Screening for breast cancer 07/19/2018  . HCAP (healthcare-associated pneumonia) 06/03/2018  . Chronic congestive heart failure with left ventricular diastolic dysfunction ([place]) 04/12/2018  . Obstructive chronic bronchitis without exacerbation ([place]) 02/17/2018  . Major depression, chronic 02/17/2018  . 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 Pollen allergies 01/16/2018  . CHF (congestive heart failure) ([place]) 05/28/2017  . Syncope 02/06/2017  . Pancreatic mass 02/06/2017  . Iron deficiency anemia 05/01/2016  . Anemia in chronic renal disease 04/26/2016  . Insomnia 06/12/2015  . CKD (chronic kidney disease) stage 2, GFR 60-89 ml/min 06/12/2015  . Hypercalcemia 06/12/2015  . Hypothyroidism 06/12/2015  . 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 Hyperlipidemia 06/12/2015  . Essential hypertension 06/12/2015  . Atherosclerotic heart disease 06/12/2015  . Solitary pulmonary nodule 06/12/2015    1. Obstructive chronic bronchitis without exacerbation ([place]) Stable, continue follow ups as planned.   2. Invasive aspergillosis ([place]) Has appt with [facility].   3. Chronic respiratory failure with hypoxia ([facility]) Continue with oxygen and current medications.   4. Oxygen dependent Continue to use oxygen as directed at 2 LPM.   5. ILD (interstitial lung disease) ([facility]) Stable, no new symptoms.  Continue to follow.  6. Major depression, chronic Stable, continue with Remeron as directed.  - mirtazapine (REMERON) 15 MG tablet; TAKE 1/2 OF A TABLET (7.5 MG TOTAL) BY MOUTH AT BEDTIME.  Dispense: 45 tablet; Refill: 1  7. Essential hypertension Refilled Coreg, continue as directed.  - carvedilol (COREG) 6.25 MG tablet; Take 1 tablet (6.25 mg total) by mouth 2 (two) times daily.  Dispense: 60 tablet; Refill: 5  8. SOB (shortness of breath) - Spirometry with Graph  General Counseling: I have discussed the findings of the evaluation and examination with  Eleaner.  I have also discussed any further diagnostic evaluation thatmay be needed or ordered today. Lonnetta verbalizes understanding of the findings of todays visit. We also reviewed her medications today and discussed drug interactions and side effects including but not limited excessive drowsiness and altered mental states. We also discussed that there is always a risk not just to her but also people around her. she has been encouraged to call the office with any questions or concerns that should arise related to todays visit.  Orders Placed This Encounter  Procedures  . Spirometry with Graph  Order Specific Question:   Where should this test be performed?    Answer:   [place]     Time spent: 25 This patient was seen by Orson Gear AGNP-C in Collaboration with Dr. Devona Konig as a part of collaborative care agreement.   I have personally obtained a history, examined the patient, evaluated laboratory and imaging results, formulated the assessment and plan and placed orders.    Allyne Gee, MD [facility] Pulmonary and Critical Care Sleep medicine

## 2019-11-29 ENCOUNTER — Telehealth: Payer: Self-pay

## 2019-11-29 NOTE — Telephone Encounter (Signed)
CONFIRMED AND SCREENED FOR 12-04-19 OV.

## 2019-12-04 ENCOUNTER — Ambulatory Visit (INDEPENDENT_AMBULATORY_CARE_PROVIDER_SITE_OTHER): Payer: Medicare Other | Admitting: Nurse Practitioner

## 2019-12-04 ENCOUNTER — Encounter: Payer: Self-pay | Admitting: Nurse Practitioner

## 2019-12-04 ENCOUNTER — Other Ambulatory Visit: Payer: Self-pay

## 2019-12-04 VITALS — BP 150/61 | HR 87 | Temp 97.2°F | Resp 16 | Ht 61.0 in | Wt 143.4 lb

## 2019-12-04 DIAGNOSIS — Z1231 Encounter for screening mammogram for malignant neoplasm of breast: Secondary | ICD-10-CM | POA: Diagnosis not present

## 2019-12-04 DIAGNOSIS — Z9981 Dependence on supplemental oxygen: Secondary | ICD-10-CM

## 2019-12-04 DIAGNOSIS — J449 Chronic obstructive pulmonary disease, unspecified: Secondary | ICD-10-CM

## 2019-12-04 DIAGNOSIS — I10 Essential (primary) hypertension: Secondary | ICD-10-CM | POA: Diagnosis not present

## 2019-12-04 NOTE — Progress Notes (Signed)
[facility] [place], [place] [address]  Internal MEDICINE  Office Visit Note  Patient Name: Melissa Knox  [id number]  [id number]  Date of Service: 12/15/2019  Chief Complaint  Patient presents with  . Hypertension  . Hyperlipidemia  . Hypothyroidism    The patient is here for routine follow up visit. She feels fatigued at times, but otherwise feels well. She continues to use oxygen at all times. Does get shortness of breath with exertion. Uses inhalers and respiratory medications as prescribed. Watches her young granddaughter while her granddaughter is doing Holiday representative. Most recent check of her thyroid was normal. Blood pressure mildly elevated today but is generally well controlled.       Current Medication: Outpatient Encounter Medications as of 12/04/2019  Medication Sig Note  . albuterol (PROVENTIL) (2.5 MG/3ML) 0.083% nebulizer solution Take 3 mLs (2.5 mg total) by nebulization every 6 (six) hours as needed for wheezing or shortness of breath.   Marland Kitchen amLODipine (NORVASC) 10 MG tablet Take 1 tablet (10 mg total) by mouth daily.   Marland Kitchen aspirin 325 MG tablet Take 1 tablet (325 mg total) by mouth daily.   Marland Kitchen atorvastatin (LIPITOR) 10 MG tablet Take 1 tablet (10 mg total) by mouth daily at 6 PM.   . budesonide-formoterol (SYMBICORT) 160-4.5 MCG/ACT inhaler Inhale 2 puffs into the lungs 2 (two) times daily.   . carvedilol (COREG) 6.25 MG tablet Take 1 tablet (6.25 mg total) by mouth 2 (two) times daily.   . clopidogrel (PLAVIX) 75 MG tablet Take 1 tablet (75 mg total) by mouth daily.   Marland Kitchen docusate sodium (COLACE) 100 MG capsule Take 1 capsule (100 mg total) by mouth 2 (two) times daily as needed for mild constipation. 07/17/2019: .  Marland Kitchen ferrous sulfate 325 (65 FE) MG tablet Take 1 tablet (325 mg total) by mouth 2 (two) times daily with a meal.   . furosemide (LASIX) 20 MG tablet TAKE 1 TABLET BY MOUTH EVERY DAY   . levothyroxine (SYNTHROID) 50 MCG tablet  Take 1 tablet (50 mcg total) by mouth daily before breakfast.   . loratadine (CLARITIN) 10 MG tablet TAKE 1 TABLET BY MOUTH EVERY DAY FOR ALLERGIES AS NEEDED   . losartan (COZAAR) 100 MG tablet Take 100 mg by mouth daily. 07/17/2019: .  . meclizine (ANTIVERT) 12.5 MG tablet Take 12.5 mg by mouth 3 (three) times daily as needed for dizziness.  07/17/2019: .  . mirtazapine (REMERON) 15 MG tablet TAKE 1/2 OF A TABLET (7.5 MG TOTAL) BY MOUTH AT BEDTIME.   Marland Kitchen oxyCODONE-acetaminophen (PERCOCET/ROXICET) 5-325 MG tablet Take 1 tablet by mouth every 6 (six) hours as needed for moderate pain.   . OXYGEN Inhale 3 L into the lungs.   . pantoprazole (PROTONIX) 40 MG tablet Take 1 tablet (40 mg total) by mouth 2 (two) times daily.   . sucralfate (CARAFATE) 1 g tablet Take 1 tablet (1 g total) by mouth 4 (four) times daily -  with meals and at bedtime.    No facility-administered encounter medications on file as of 12/04/2019.    Surgical History: Past Surgical History:  Procedure Laterality Date  . CAROTID ENDARTERECTOMY Right 08/01/2019  . ENDARTERECTOMY Right 08/01/2019   Procedure: ENDARTERECTOMY CAROTID;  Surgeon: Marty Heck, MD;  Location: [facility] [place];  Service: Vascular;  Laterality: Right;  . FRACTURE SURGERY     right ankle  . GALLBLADDER SURGERY  2012  . IR ARCH CERVICICEBRAL NON-SEL (MS)  07/19/2019  .  MULTIPLE TOOTH EXTRACTIONS    . PATCH ANGIOPLASTY Right 08/01/2019   Procedure: Patch Angioplasty;  Surgeon: Marty Heck, MD;  Location: [place];  Service: Vascular;  Laterality: Right;  . TUBAL LIGATION      Medical History: Past Medical History:  Diagnosis Date  . Atherosclerotic heart disease 06/12/2015  . CHF (congestive heart failure) ([place])   . Chronic congestive heart failure with left ventricular diastolic dysfunction ([place]) 04/12/2018  . CKD (chronic kidney disease) stage 2, GFR 60-89 ml/min 06/12/2015  . COPD (chronic obstructive pulmonary disease) ([place])   . Full dentures    . Hypercalcemia 06/12/2015  . Hyperlipidemia 06/12/2015  . Hypertension 06/12/2015  . Hypothyroidism 06/12/2015  . Insomnia 06/12/2015  . Interstitial pneumonia ([place]) 04/12/2018  . On supplemental oxygen by nasal cannula    at HS and PRN  . 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 Shortness of breath 06/12/2015  . Solitary pulmonary nodule 06/12/2015  . Wears glasses     Family History: Family History  Problem Relation Age of Onset  . Cancer Mother   . Hypertension Father   . Diabetes Father   . Cancer Father     Social History   Socioeconomic History  . Marital status: Married    Spouse name: Daryl  . Number of children: 3  . Years of education: 71  . Highest education level: 12th grade  Occupational History  . Not on file  Tobacco Use  . Smoking status: Former Smoker    Types: Cigarettes    Quit date: 2013    Years since quitting: 8.2  . Smokeless tobacco: Never Used  Substance and Sexual Activity  . Alcohol use: No    Alcohol/week: 0.0 standard drinks  . Drug use: No  . Sexual activity: Yes    Birth control/protection: None  Other Topics Concern  . Not on file  Social History Narrative  . Not on file   Social Determinants of Health   Financial Resource Strain:   . Difficulty of Paying Living Expenses:   Food Insecurity:   . Worried About Charity fundraiser in the Last Year:   . Arboriculturist in the Last Year:   Transportation Needs:   . Film/video editor (Medical):   Marland Kitchen Lack of Transportation (Non-Medical):   Physical Activity:   . Days of Exercise per Week:   . Minutes of Exercise per Session:   Stress:   . Feeling of Stress :   Social Connections:   . Frequency of Communication with Friends and Family:   . Frequency of Social Gatherings with Friends and Family:   . Attends Religious Services:   . Active Member of Clubs or Organizations:   . Attends Archivist Meetings:   Marland Kitchen Marital Status:   Intimate Partner Violence:    . Fear of Current or Ex-Partner:   . Emotionally Abused:   Marland Kitchen Physically Abused:   . Sexually Abused:       Review of Systems  Constitutional: Positive for fatigue. Negative for appetite change, chills, fever and unexpected weight change.  HENT: Negative for congestion, postnasal drip, rhinorrhea, sinus pressure, sinus pain, sore throat and tinnitus.        Rhinorrhea with nasal cannula use  Eyes: Positive for visual disturbance. Negative for photophobia and pain.       Decreased vision in right eye s/p cerebrovascular incident  Respiratory: Positive for shortness of breath and wheezing. Negative for cough.  Intermittent productive cough. Shortness of breath with exertion. Using oxygen at all times.   Cardiovascular: Negative for chest pain and palpitations.  Gastrointestinal: Negative for abdominal pain, constipation, diarrhea, nausea and vomiting.  Endocrine: Negative for cold intolerance, heat intolerance, polydipsia and polyuria.  Musculoskeletal: Negative for back pain, joint swelling and myalgias.  Skin: Positive for wound. Negative for rash.       Right neck surgical incision, healing  Allergic/Immunologic: Negative for environmental allergies.  Neurological: Negative for dizziness, light-headedness, numbness and headaches.  Hematological: Negative for adenopathy.  Psychiatric/Behavioral: Negative for confusion, dysphoric mood and sleep disturbance.    Today's Vitals   12/04/19 1138  BP: (!) 150/61  Pulse: 87  Resp: 16  Temp: (!) 97.2 F (36.2 C)  SpO2: 99%  Weight: 143 lb 6.4 oz (65 kg)  Height: 5\' 1"  (1.549 m)   Body mass index is 27.1 kg/m.  Physical Exam Vitals and nursing note reviewed.  Constitutional:      General: She is not in acute distress.    Appearance: Normal appearance. She is well-developed. She is not diaphoretic.  HENT:     Head: Normocephalic and atraumatic.     Nose: Nose normal.     Mouth/Throat:     Pharynx: No oropharyngeal  exudate.  Eyes:     Conjunctiva/sclera: Conjunctivae normal.     Pupils: Pupils are equal, round, and reactive to light.  Neck:     Thyroid: No thyromegaly.     Vascular: No JVD.     Trachea: No tracheal deviation.  Cardiovascular:     Rate and Rhythm: Normal rate and regular rhythm.     Heart sounds: Murmur present. No friction rub. No gallop.   Pulmonary:     Effort: Pulmonary effort is normal. No respiratory distress.     Breath sounds: Normal breath sounds. Decreased air movement present. No wheezing or rales.     Comments: Diminished breath sounds at bilateral lung bases.  Chest:     Chest wall: No tenderness.  Abdominal:     Palpations: Abdomen is soft.     Tenderness: There is no abdominal tenderness.  Musculoskeletal:        General: Normal range of motion.     Cervical back: Normal range of motion and neck supple.  Lymphadenopathy:     Cervical: No cervical adenopathy.  Skin:    General: Skin is warm and dry.     Capillary Refill: Capillary refill takes 2 to 3 seconds.  Neurological:     Mental Status: She is alert and oriented to person, place, and time.     Cranial Nerves: No cranial nerve deficit.  Psychiatric:        Behavior: Behavior normal.        Thought Content: Thought content normal.        Judgment: Judgment normal.    Assessment/Plan: 1. Essential hypertension Generally stable. Continue bp medication as prescribed.   2. Obstructive chronic bronchitis without exacerbation ([place]) Continue with inhalers and respiratory medications as prescribed   3. Oxygen dependent Use oxygen as prescribed   4. Encounter for screening mammogram for malignant neoplasm of breast - MM DIGITAL SCREENING BILATERAL; Future  General Counseling: Selisa verbalizes understanding of the findings of todays visit and agrees with plan of treatment. I have discussed any further diagnostic evaluation that may be needed or ordered today. We also reviewed her medications today. she  has been encouraged to call the office with any questions or concerns  that should arise related to todays visit.  This patient was seen by Leretha Pol FNP Collaboration with Dr Lavera Guise as a part of collaborative care agreement  Orders Placed This Encounter  Procedures  . MM DIGITAL SCREENING BILATERAL     Total time spent: 20 Minutes   Time spent includes review of chart, medications, test results, and follow up plan with the patient.      Dr Lavera Guise Internal medicine

## 2019-12-28 DIAGNOSIS — H34231 Retinal artery branch occlusion, right eye: Secondary | ICD-10-CM | POA: Diagnosis not present

## 2019-12-28 DIAGNOSIS — H353132 Nonexudative age-related macular degeneration, bilateral, intermediate dry stage: Secondary | ICD-10-CM | POA: Diagnosis not present

## 2019-12-28 DIAGNOSIS — H43823 Vitreomacular adhesion, bilateral: Secondary | ICD-10-CM | POA: Diagnosis not present

## 2019-12-28 DIAGNOSIS — H35033 Hypertensive retinopathy, bilateral: Secondary | ICD-10-CM | POA: Diagnosis not present

## 2020-01-08 DIAGNOSIS — Z1231 Encounter for screening mammogram for malignant neoplasm of breast: Secondary | ICD-10-CM | POA: Diagnosis not present

## 2020-01-11 ENCOUNTER — Other Ambulatory Visit: Payer: Self-pay

## 2020-01-11 MED ORDER — FUROSEMIDE 20 MG PO TABS: 20.0000 mg | ORAL_TABLET | Freq: Every day | ORAL | 6 refills | Status: AC

## 2020-02-04 ENCOUNTER — Other Ambulatory Visit: Payer: Self-pay

## 2020-02-04 DIAGNOSIS — K279 Peptic ulcer, site unspecified, unspecified as acute or chronic, without hemorrhage or perforation: Secondary | ICD-10-CM

## 2020-02-04 MED ORDER — PANTOPRAZOLE SODIUM 40 MG PO TBEC: 40.0000 mg | DELAYED_RELEASE_TABLET | Freq: Two times a day (BID) | ORAL | 5 refills | Status: AC

## 2020-02-15 ENCOUNTER — Other Ambulatory Visit: Payer: Self-pay

## 2020-02-15 DIAGNOSIS — D509 Iron deficiency anemia, unspecified: Secondary | ICD-10-CM

## 2020-02-15 MED ORDER — FERROUS SULFATE 325 (65 FE) MG PO TABS: 325.0000 mg | ORAL_TABLET | Freq: Two times a day (BID) | ORAL | 1 refills | Status: AC

## 2020-02-27 ENCOUNTER — Other Ambulatory Visit: Payer: Self-pay

## 2020-02-27 IMAGING — XA IR ARCH CERVICOCEBRAL NON-SEL (MS)
1 series · 12 of 24 positions shown · IV contrast (IODINE)
Comparison: MRI MRA July 18, 2019.

CLINICAL DATA: History of left sided weakness. MRI of the brain
suggestive of high grade stenosis within the right internal carotid
artery proximally, and the brachycephalic branches.

EXAM:
IR ARCH CERVICOCEBRAL NON-SEL (MS)
TECHNIQUE: Informed written consent was obtained from the patient after a
thorough discussion of the procedural risks, benefits and
alternatives. All questions were addressed. Maximal Sterile Barrier
Technique was utilized including caps, mask, sterile gowns, sterile
gloves, sterile drape, hand hygiene and skin antiseptic. A timeout
was performed prior to the initiation of the procedure.

[Series 300: dr. (person_name) · 12 of 53 slices shown]
[im 3/53]
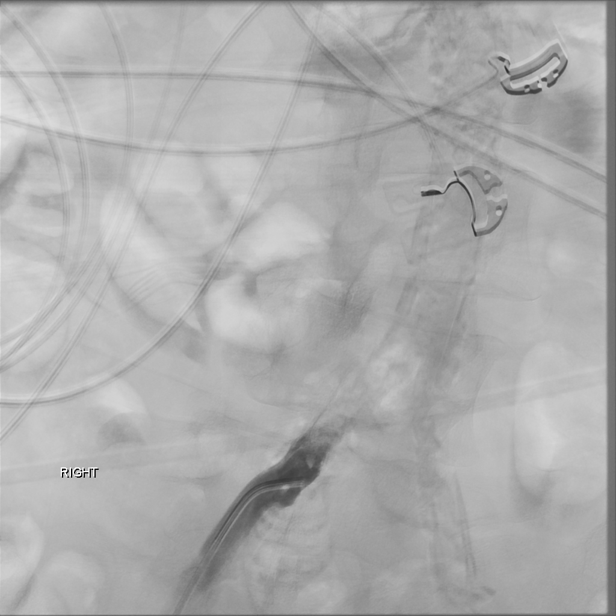
[im 7/53]
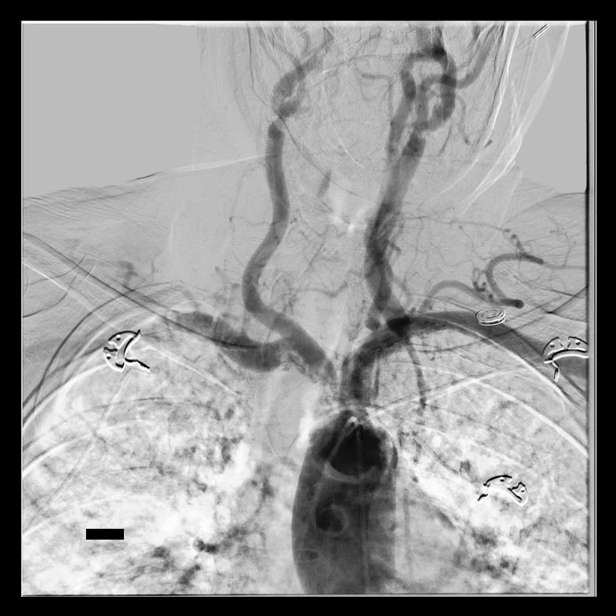
[im 12/53]
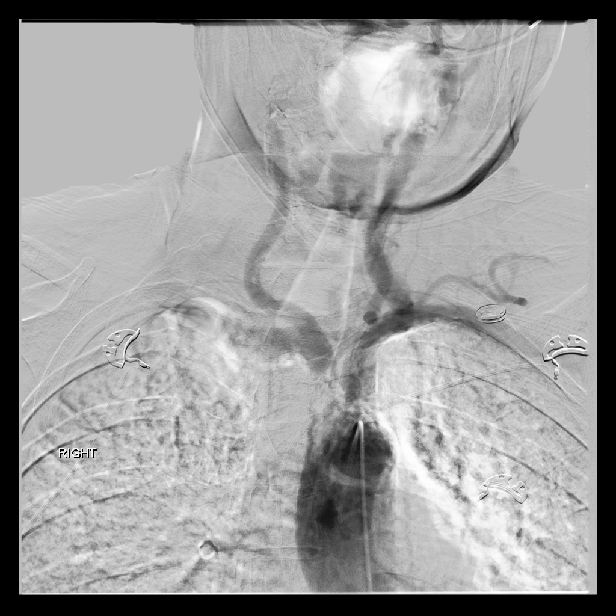
[im 16/53]
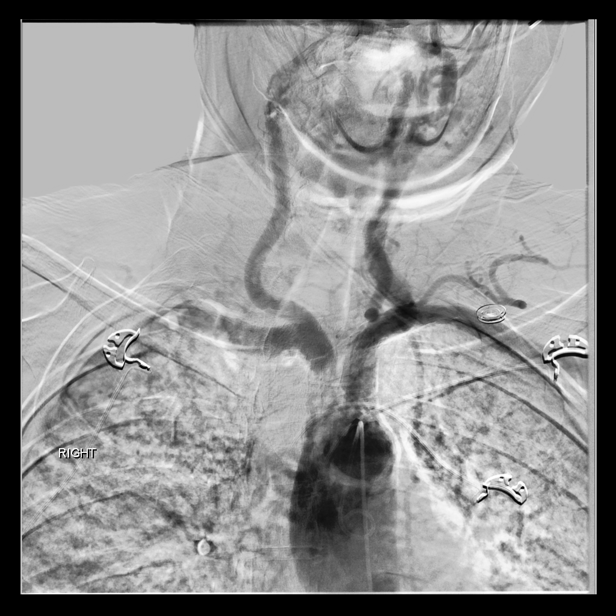
[im 21/53]
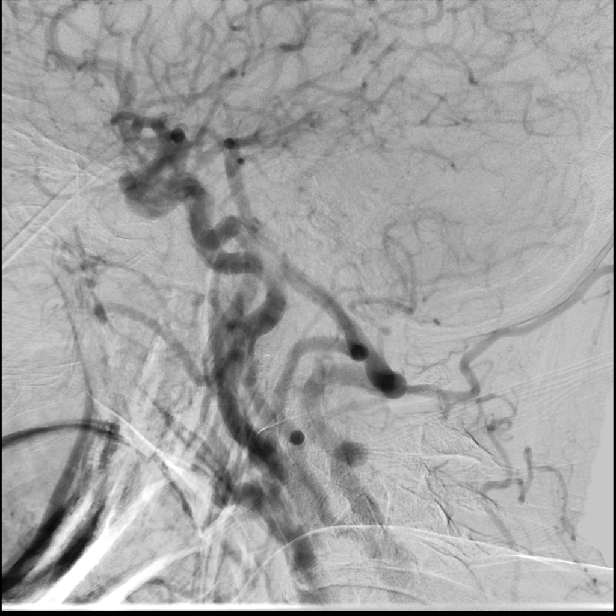
[im 25/53]
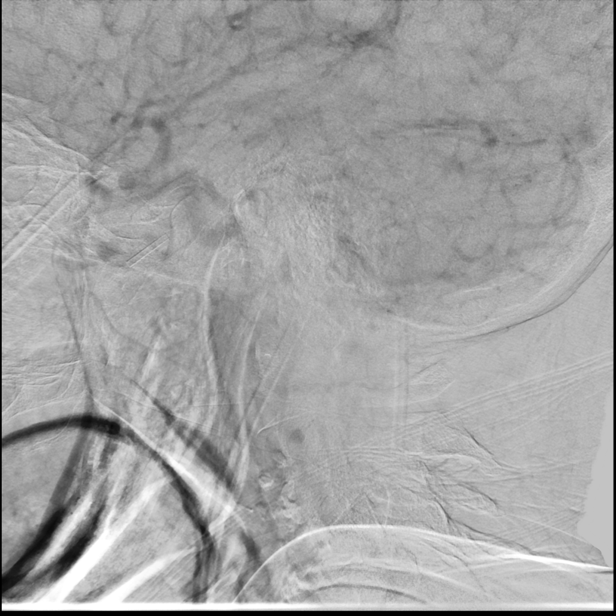
[im 30/53]
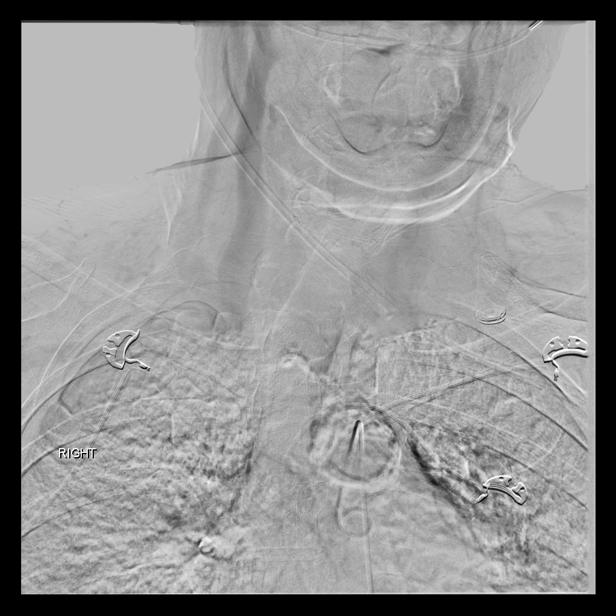
[im 34/53]
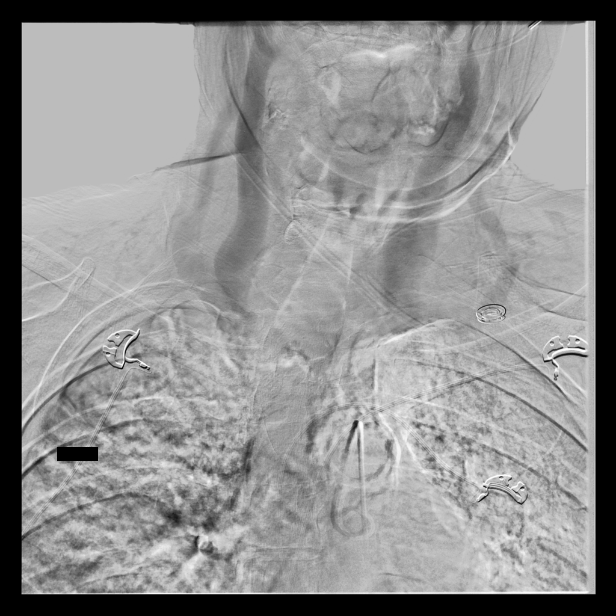
[im 39/53]
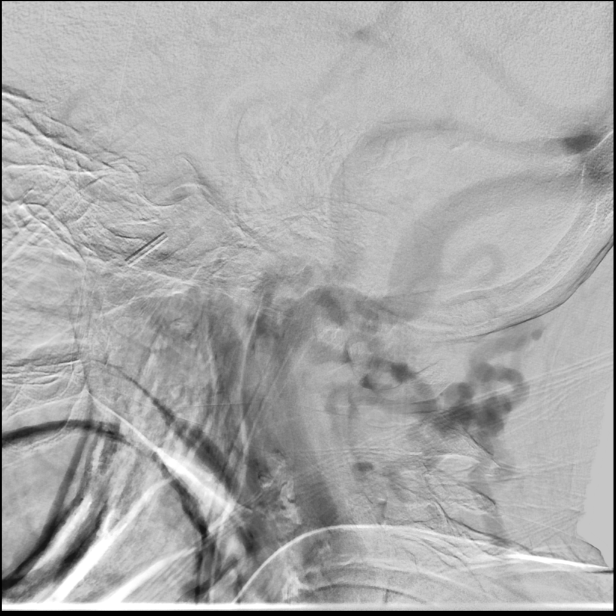
[im 43/53]
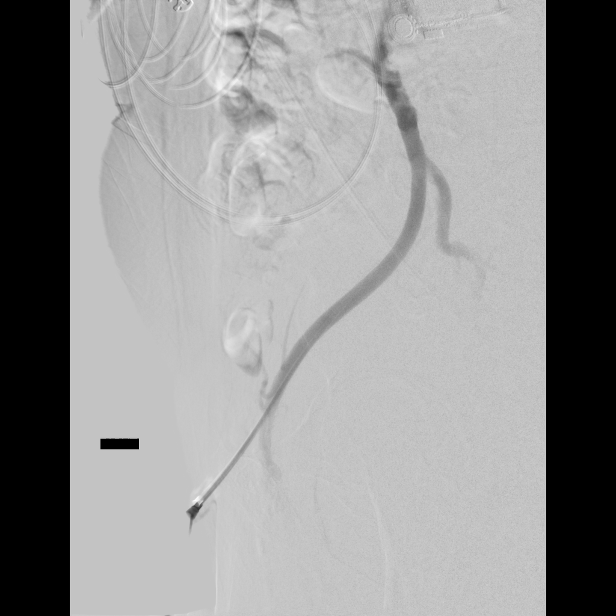
[im 48/53]
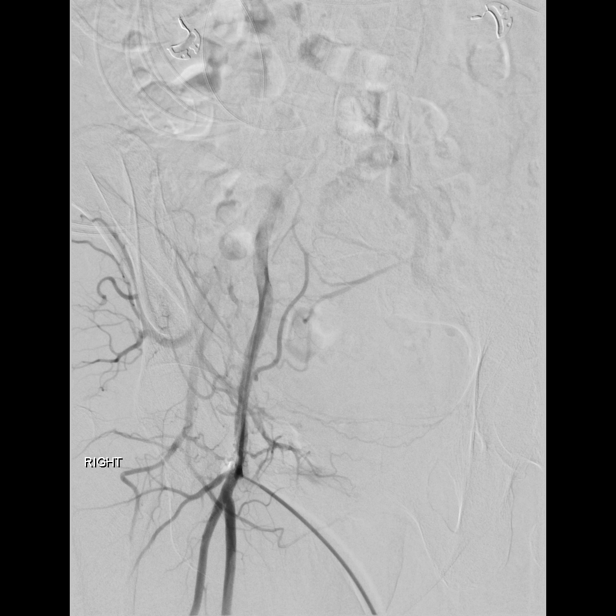
[im 53/53]
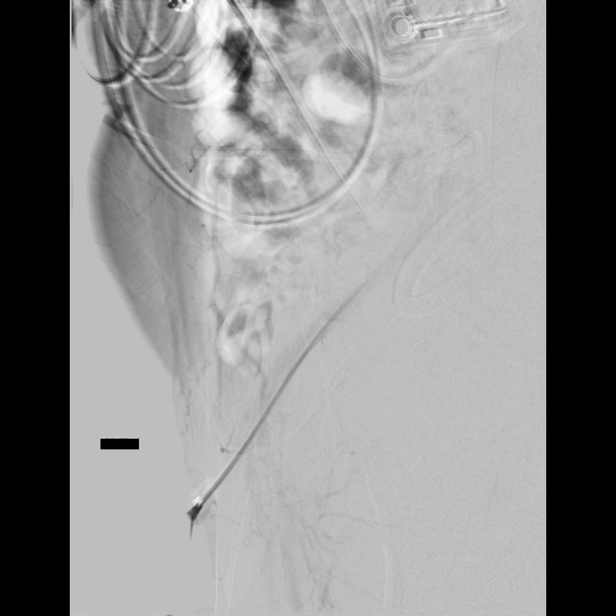

[12 of 24 positions shown; findings below may reference images not displayed]

MEDICATIONS:
Heparin 7888 units IV no antibiotic was administered within 1 hour
of the procedure

ANESTHESIA/SEDATION:
Versed 1 mg IV; Fentanyl 25 mcg IV

Moderate Sedation Time:  42 minutes

The patient was continuously monitored during the procedure by the
interventional radiology nurse under my direct supervision.

CONTRAST:  Isovue 300 approximately 80 mL.

FLUOROSCOPY TIME:  Fluoroscopy Time: 7 minutes 12 seconds (201 mGy).

COMPLICATIONS:
None immediate.
The right groin was prepped and draped in the usual sterile fashion.
Thereafter using modified Seldinger technique, transfemoral access
into the right common femoral artery was obtained without
difficulty. Over a 0.035 inch guidewire, a 5 French Pinnacle sheath
was inserted. Through this, and also over 0.035 inch guidewire, a 5
French JB 1 catheter was advanced to the aortic arch region. However
due to heavy calcification in the entirety of the aortic arch,
access into the origins of the vessels was deemed high risk in terms
of embolization distally.

This was then exchanged for a 5 French pigtail catheter which was
exchanged over the 0.035 inch guidewire and positioned in the
ascending thoracic aorta.

This was then connected to an injector. Arteriograms were then
performed at this site to cover the aortic arch, to the cranial
skull base and beyond.

AP and lateral projections were obtained.
FINDINGS: The aortic arch demonstrates heavy circumferential calcification in
the entirety of the aortic arch region extending into the descending
and also the ascending thoracic aorta. Heavy calcification was also
seen extending into the innominate artery and the proximal right
subclavian artery. The heavy calcification with stenosis is seen to
extend into the right subclavian artery proximally and also the
right common carotid artery.

However, antegrade flow is seen in the right subclavian artery with
delayed retrograde opacification of the right vertebral artery
consistent with subclavian steal.

The visualized right common carotid artery bifurcation demonstrates
a high-grade stenosis with approximately 70% involving the right
internal carotid artery and also the right common carotid artery
region.

Antegrade flow is seen in the right internal carotid artery to the
cranial skull base.

The origin of the left common carotid artery appears widely patent.

The common carotid artery bifurcation demonstrates approximately
50-60% stenosis in the region of the bulb of the left internal
carotid artery. Antegrade flow is seen into the distal left internal
carotid artery. The left external carotid artery branches appear to
be grossly widely patent.

A dominant left vertebral artery origin demonstrates mild narrowing.
The left subclavian artery origin also demonstrates calcification
proximally but without significant stenosis.

More distally the left vertebral artery is seen to opacify to the
cranial skull base. Wide patency is seen of the left vertebrobasilar
junction and the left posterior-inferior cerebellar artery.

On the lateral projection, the basilar artery, the superior
cerebellar arteries, the posterior cerebral arteries and the
anterior-inferior cerebellar arteries demonstrate wide patency.

Retrograde opacification is seen of the right vertebrobasilar
junction as described earlier.

On the lateral projection, also patency is seen of the petrous
cavernous and the supraclinoid internal carotid arteries
bilaterally.

The visualized anterior cerebral artery, and the middle cerebral
artery distribution demonstrate wide patency grossly without
evidence of gross occlusions, or of intraluminal filling defects.
IMPRESSION: Severe high-grade pre occlusive calcific stenosis of the origin of
the innominate artery, extending into the proximal right subclavian
artery and the right common carotid artery origins.

Approximately 70% stenosis at the origin of the right internal
carotid artery, and 50% stenosis at the origin of the left internal
carotid artery.

Right-sided subclavian steal as described above.

PLAN:
Findings reviewed with the patient and also the patient's referring
TOWANDA.

## 2020-02-27 MED ORDER — LEVOTHYROXINE SODIUM 50 MCG PO TABS: 50.0000 ug | ORAL_TABLET | Freq: Every day | ORAL | 1 refills | Status: AC

## 2020-02-28 IMAGING — CT CT CHEST W/O CM
2 of 4 series · 15 of 36 positions shown, 18 images · non-contrast
Comparison: CT 05/08/2018

CLINICAL DATA: Preoperative planning for vascular surgical
intervention. Advanced atherosclerotic disease. Emphysema.

EXAM:
CT CHEST WITHOUT CONTRAST
TECHNIQUE: Multidetector CT imaging of the chest was performed following the
standard protocol without IV contrast.

[Series 3: chest wo · axial · 0.73mm/px · z∈[+115,+357]mm · 12 of 143 slices shown, 15 images]
[im 11/143  mediastinal]
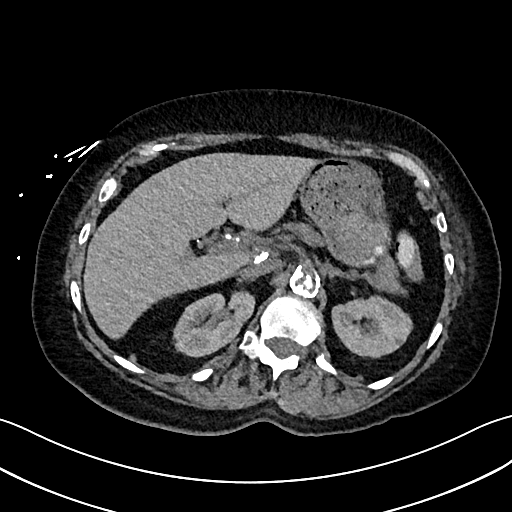
[im 11/143  lung]
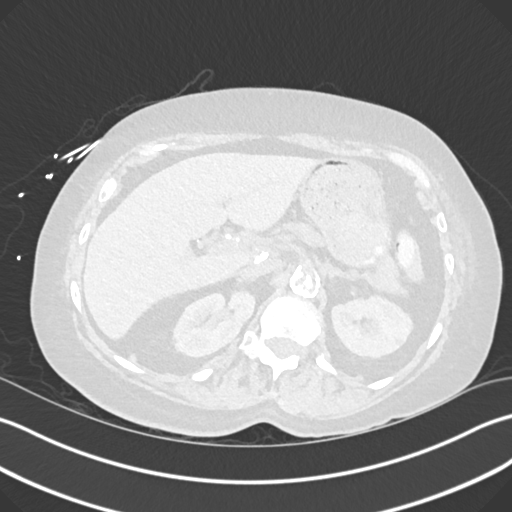
[im 22/143  lung]
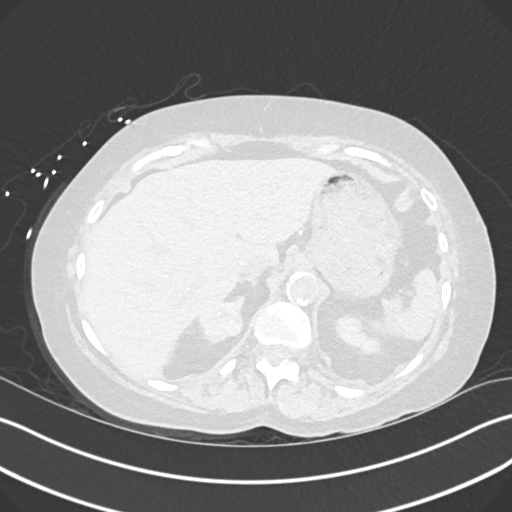
[im 33/143  lung]
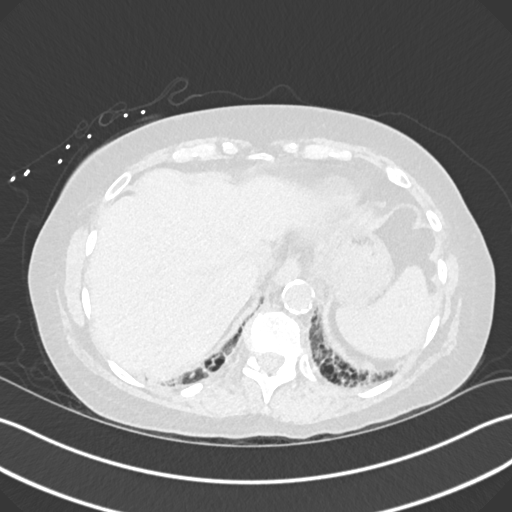
[im 44/143  lung]
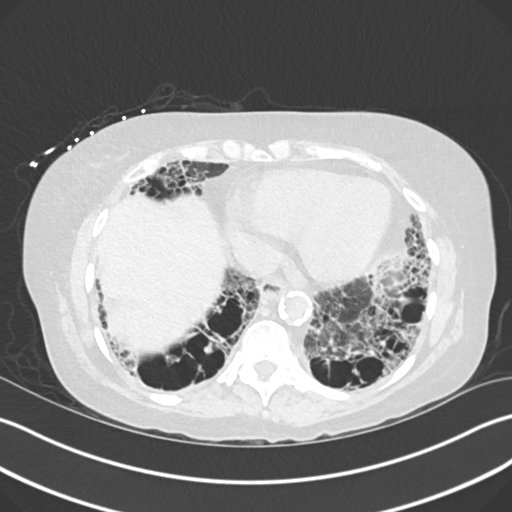
[im 55/143  mediastinal]
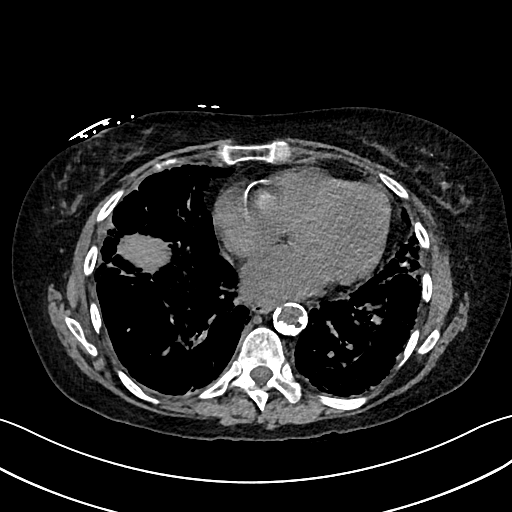
[im 55/143  lung]
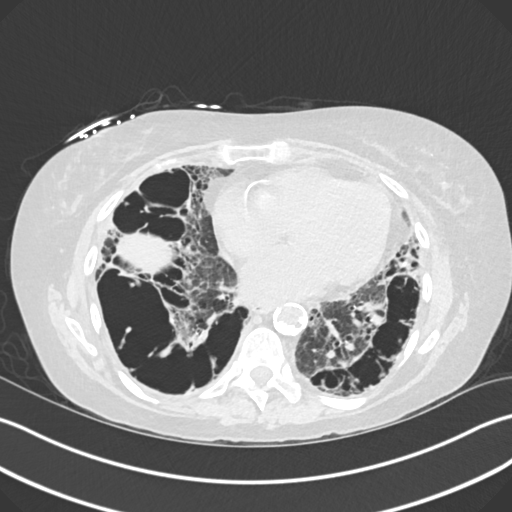
[im 66/143  lung]
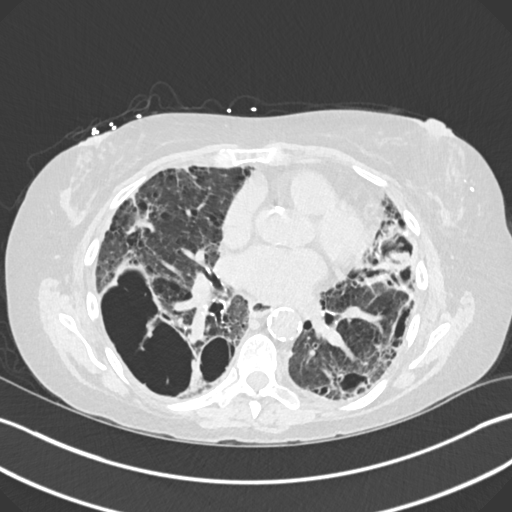
[im 77/143  lung]
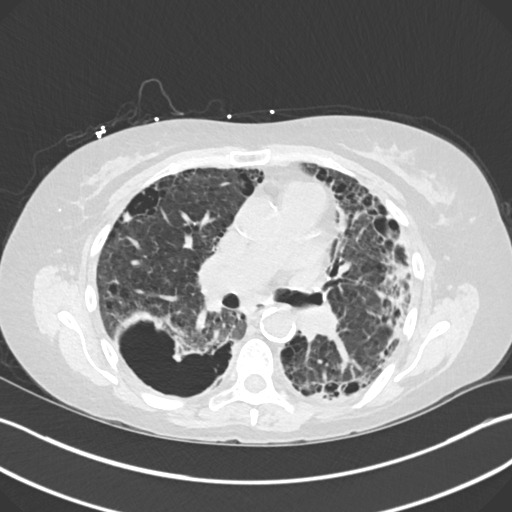
[im 88/143  lung]
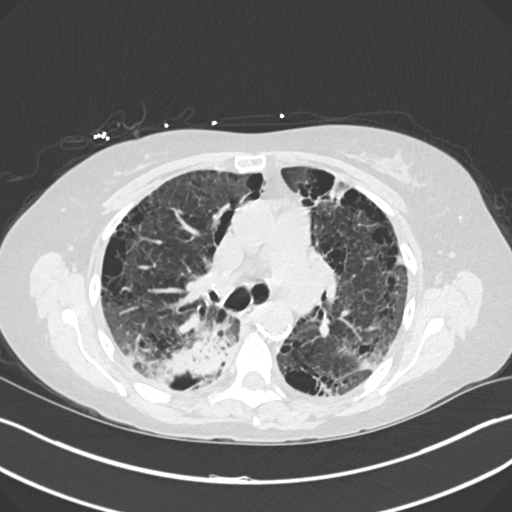
[im 99/143  mediastinal]
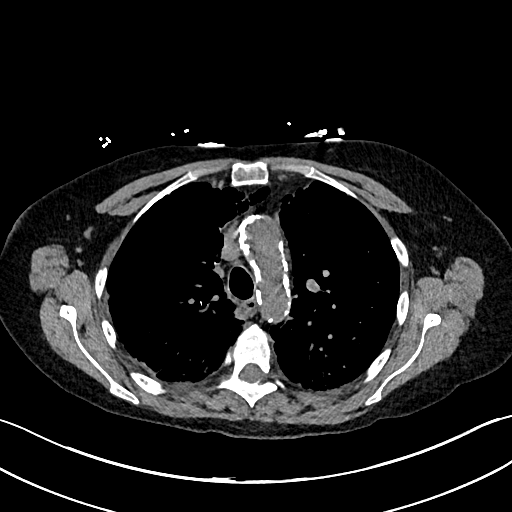
[im 99/143  lung]
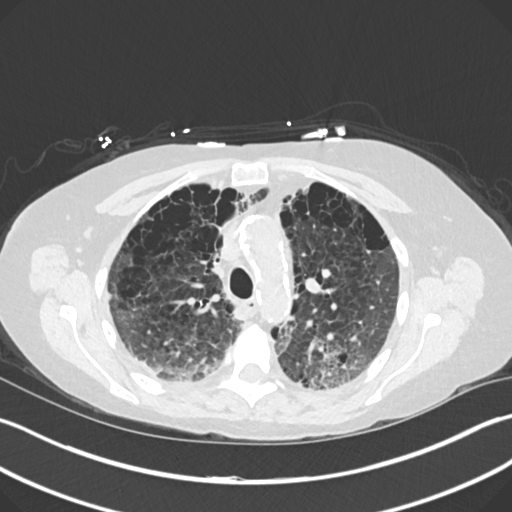
[im 110/143  lung]
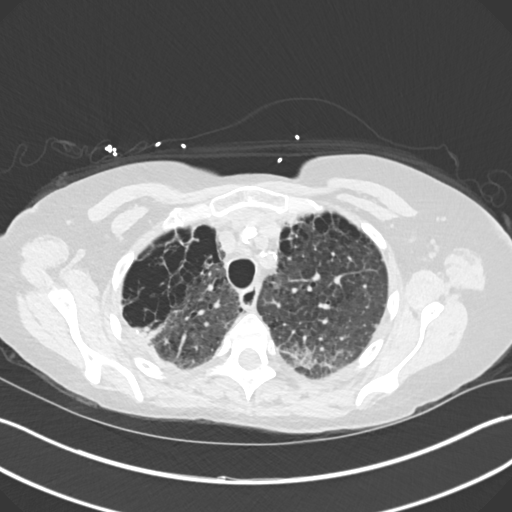
[im 121/143  lung]
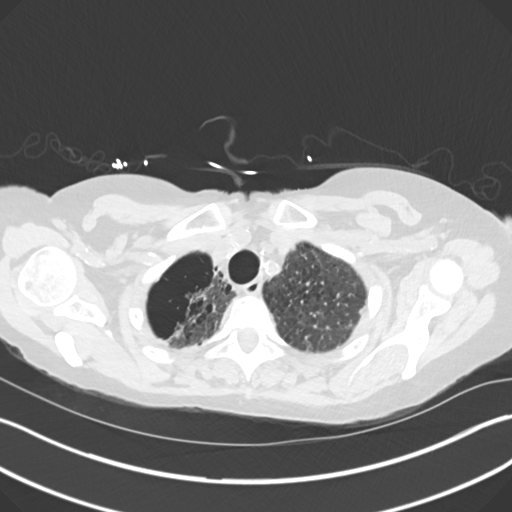
[im 132/143  lung]
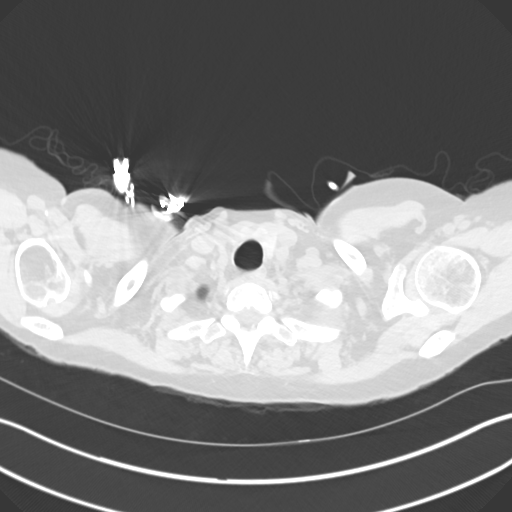

[Series 6: cor · coronal · 0.61mm/px · 3 of 134 slices shown]
[im 27/134  lung]
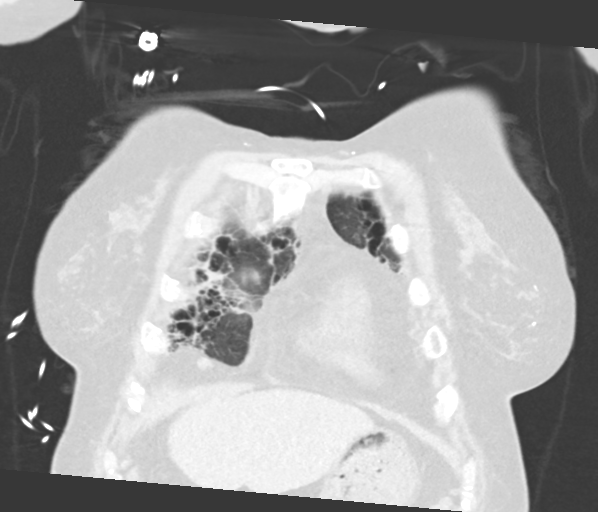
[im 54/134  lung]
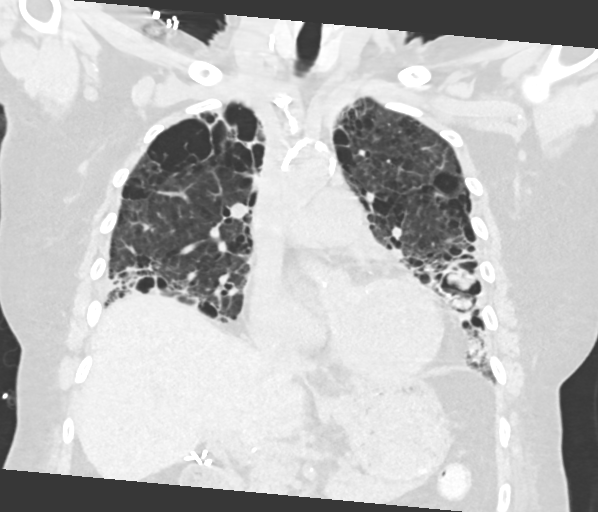
[im 80/134  lung]
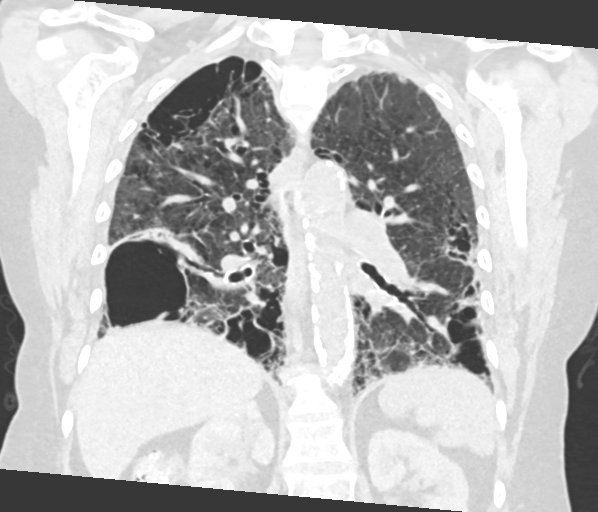

[15 of 36 positions shown; findings below may reference images not displayed]

FINDINGS: Cardiovascular: Heart size is borderline enlarged. No pericardial
effusion. Extensive atherosclerotic calcification of the thoracic
aorta, coronary arteries, and arch vessels. Prominent calcified
plaque at the origin of the brachiocephalic artery (series 6, image
50). Evaluation for the degree of stenosis is limited on this
noncontrast exam. There is high-grade stenosis at the proximal
abdominal aorta at the level of the celiac axis secondary to bulky
calcified atherosclerotic plaque (series 3, image 131).

Mediastinum/Nodes: Mildly enlarged precarinal node measuring 1.5 cm
short axis (series 3, image 53), unchanged from prior. No axillary
lymphadenopathy. No hilar lymphadenopathy. Thyroid gland
unremarkable. Trachea and esophagus grossly unremarkable.

Lungs/Pleura: Advanced bullous centrilobular and paraseptal
emphysema. Chronic consolidation within the posterior aspect of the
right upper lobe measuring approximately 4.7 x 2.4 cm trans axially
(series 4, image 54), and appears more confluent compared to prior.
Bronchiectasis most evident at the lung bases. No pleural effusion.
No pneumothorax.

Upper Abdomen: No acute findings.

Musculoskeletal: No chest wall mass or suspicious bone lesions
identified.
IMPRESSION: 1. Prominent calcified plaque at the origin of the brachiocephalic
artery. Evaluation for the degree of stenosis is limited on this
noncontrast exam.
2. High-grade stenosis of the proximal abdominal aorta at the level
of the celiac axis secondary to bulky calcified atherosclerotic
plaque.
3. Advanced bullous centrilobular and paraseptal emphysema with
chronic consolidation in the posterior aspect of the right upper
lobe.
4. Chronic consolidation of the posterior aspect of the right upper
lobe is slightly more confluent compared to prior study. Underlying
neoplasm is not excluded. Consider PET-CT for further evaluation.

Aortic Atherosclerosis (6TKF3-L2O.O) and Emphysema (6TKF3-GWG.G).

## 2020-03-21 ENCOUNTER — Telehealth: Payer: Self-pay

## 2020-03-21 NOTE — Telephone Encounter (Signed)
Confirmed appointment on 03/24/2020 and screened for covid. klh ° °

## 2020-03-24 ENCOUNTER — Ambulatory Visit (INDEPENDENT_AMBULATORY_CARE_PROVIDER_SITE_OTHER): Payer: Medicare Other | Admitting: Internal Medicine

## 2020-03-24 ENCOUNTER — Other Ambulatory Visit: Payer: Self-pay

## 2020-03-24 ENCOUNTER — Encounter: Payer: Self-pay | Admitting: Internal Medicine

## 2020-03-24 VITALS — BP 147/63 | HR 97 | Temp 97.5°F | Resp 16 | Ht 61.0 in | Wt 144.6 lb

## 2020-03-24 DIAGNOSIS — J849 Interstitial pulmonary disease, unspecified: Secondary | ICD-10-CM | POA: Diagnosis not present

## 2020-03-24 DIAGNOSIS — J9611 Chronic respiratory failure with hypoxia: Secondary | ICD-10-CM

## 2020-03-24 DIAGNOSIS — I6523 Occlusion and stenosis of bilateral carotid arteries: Secondary | ICD-10-CM

## 2020-03-24 DIAGNOSIS — Z9981 Dependence on supplemental oxygen: Secondary | ICD-10-CM

## 2020-03-24 DIAGNOSIS — J449 Chronic obstructive pulmonary disease, unspecified: Secondary | ICD-10-CM | POA: Diagnosis not present

## 2020-03-24 DIAGNOSIS — I10 Essential (primary) hypertension: Secondary | ICD-10-CM | POA: Diagnosis not present

## 2020-03-24 DIAGNOSIS — B449 Aspergillosis, unspecified: Secondary | ICD-10-CM

## 2020-03-24 NOTE — Progress Notes (Signed)
[facility] [place], [place] [address]  Pulmonary Sleep Medicine   Office Visit Note  Patient Name: Melissa Knox DOB: 1951/01/21 MRN [id number]  Date of Service: 03/24/2020  Complaints/HPI: Pt is here for pulmonary follow up.  Her history includes ILD, chronic resp failure and copd.  She is followed in ILD clinic at [facility]. She was diagnosed with invasive aspergillosis as well.  Overall she is at her baseline.  She denies any new or worsening symptoms She continues to wear oxygen 3 lpm day and night.    ROS  General: (-) fever, (-) chills, (-) night sweats, (-) weakness Skin: (-) rashes, (-) itching,. Eyes: (-) visual changes, (-) redness, (-) itching. Nose and Sinuses: (-) nasal stuffiness or itchiness, (-) postnasal drip, (-) nosebleeds, (-) sinus trouble. Mouth and Throat: (-) sore throat, (-) hoarseness. Neck: (-) swollen glands, (-) enlarged thyroid, (-) neck pain. Respiratory: +cough, (-) bloody sputum, +shortness of breath, -wheezing. Cardiovascular: - ankle swelling, (-) chest pain. Lymphatic: (-) lymph node enlargement. Neurologic: (-) numbness, (-) tingling. Psychiatric: (-) anxiety, (-) depression   Current Medication: Outpatient Encounter Medications as of 03/24/2020  Medication Sig Note  . albuterol (PROVENTIL) (2.5 MG/3ML) 0.083% nebulizer solution Take 3 mLs (2.5 mg total) by nebulization every 6 (six) hours as needed for wheezing or shortness of breath.   Marland Kitchen amLODipine (NORVASC) 10 MG tablet Take 1 tablet (10 mg total) by mouth daily.   Marland Kitchen aspirin 325 MG tablet Take 1 tablet (325 mg total) by mouth daily.   Marland Kitchen atorvastatin (LIPITOR) 10 MG tablet Take 1 tablet (10 mg total) by mouth daily at 6 PM.   . budesonide-formoterol (SYMBICORT) 160-4.5 MCG/ACT inhaler Inhale 2 puffs into the lungs 2 (two) times daily.   . carvedilol (COREG) 6.25 MG tablet Take 1 tablet (6.25 mg total) by mouth 2 (two) times daily.   . clopidogrel (PLAVIX) 75 MG  tablet Take 1 tablet (75 mg total) by mouth daily.   Marland Kitchen docusate sodium (COLACE) 100 MG capsule Take 1 capsule (100 mg total) by mouth 2 (two) times daily as needed for mild constipation. 07/17/2019: .  Marland Kitchen ferrous sulfate 325 (65 FE) MG tablet Take 1 tablet (325 mg total) by mouth 2 (two) times daily with a meal.   . furosemide (LASIX) 20 MG tablet Take 1 tablet (20 mg total) by mouth daily.   Marland Kitchen levothyroxine (SYNTHROID) 50 MCG tablet Take 1 tablet (50 mcg total) by mouth daily before breakfast.   . loratadine (CLARITIN) 10 MG tablet TAKE 1 TABLET BY MOUTH EVERY DAY FOR ALLERGIES AS NEEDED   . losartan (COZAAR) 100 MG tablet Take 100 mg by mouth daily. 07/17/2019: .  . meclizine (ANTIVERT) 12.5 MG tablet Take 12.5 mg by mouth 3 (three) times daily as needed for dizziness.  07/17/2019: .  . mirtazapine (REMERON) 15 MG tablet TAKE 1/2 OF A TABLET (7.5 MG TOTAL) BY MOUTH AT BEDTIME.   Marland Kitchen oxyCODONE-acetaminophen (PERCOCET/ROXICET) 5-325 MG tablet Take 1 tablet by mouth every 6 (six) hours as needed for moderate pain.   . OXYGEN Inhale 3 L into the lungs.   . pantoprazole (PROTONIX) 40 MG tablet Take 1 tablet (40 mg total) by mouth 2 (two) times daily.   . sucralfate (CARAFATE) 1 g tablet Take 1 tablet (1 g total) by mouth 4 (four) times daily -  with meals and at bedtime.    No facility-administered encounter medications on file as of 03/24/2020.    Surgical  History: Past Surgical History:  Procedure Laterality Date  . CAROTID ENDARTERECTOMY Right 08/01/2019  . ENDARTERECTOMY Right 08/01/2019   Procedure: ENDARTERECTOMY CAROTID;  Surgeon: Marty Heck, MD;  Location: [facility];  Service: Vascular;  Laterality: Right;  . FRACTURE SURGERY     right ankle  . GALLBLADDER SURGERY  2012  . IR ARCH CERVICICEBRAL NON-SEL (MS)  07/19/2019  . MULTIPLE TOOTH EXTRACTIONS    . PATCH ANGIOPLASTY Right 08/01/2019   Procedure: Patch Angioplasty;  Surgeon: Marty Heck, MD;  Location: [facility];  Service:  Vascular;  Laterality: Right;  . TUBAL LIGATION      Medical History: Past Medical History:  Diagnosis Date  . Atherosclerotic heart disease 06/12/2015  . CHF (congestive heart failure) ([place])   . Chronic congestive heart failure with left ventricular diastolic dysfunction ([place]) 04/12/2018  . CKD (chronic kidney disease) stage 2, GFR 60-89 ml/min 06/12/2015  . COPD (chronic obstructive pulmonary disease) ([place])   . Full dentures   . Hypercalcemia 06/12/2015  . Hyperlipidemia 06/12/2015  . Hypertension 06/12/2015  . Hypothyroidism 06/12/2015  . Insomnia 06/12/2015  . Interstitial pneumonia ([place]) 04/12/2018  . On supplemental oxygen by nasal cannula    at HS and PRN  . 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 Shortness of breath 06/12/2015  . Solitary pulmonary nodule 06/12/2015  . Wears glasses     Family History: Family History  Problem Relation Age of Onset  . Cancer Mother   . Hypertension Father   . Diabetes Father   . Cancer Father     Social History: Social History   Socioeconomic History  . Marital status: Married    Spouse name: Daryl  . Number of children: 3  . Years of education: 81  . Highest education level: 12th grade  Occupational History  . Not on file  Tobacco Use  . Smoking status: Former Smoker    Types: Cigarettes    Quit date: 2013    Years since quitting: 8.5  . Smokeless tobacco: Never Used  Vaping Use  . Vaping Use: Never used  Substance and Sexual Activity  . Alcohol use: No    Alcohol/week: 0.0 standard drinks  . Drug use: No  . Sexual activity: Yes    Birth control/protection: None  Other Topics Concern  . Not on file  Social History Narrative  . Not on file   Social Determinants of Health   Financial Resource Strain:   . Difficulty of Paying Living Expenses:   Food Insecurity:   . Worried About Charity fundraiser in the Last Year:   . Arboriculturist in the Last Year:   Transportation Needs:   . Film/video editor  (Medical):   Marland Kitchen Lack of Transportation (Non-Medical):   Physical Activity:   . Days of Exercise per Week:   . Minutes of Exercise per Session:   Stress:   . Feeling of Stress :   Social Connections:   . Frequency of Communication with Friends and Family:   . Frequency of Social Gatherings with Friends and Family:   . Attends Religious Services:   . Active Member of Clubs or Organizations:   . Attends Archivist Meetings:   Marland Kitchen Marital Status:   Intimate Partner Violence:   . Fear of Current or Ex-Partner:   . Emotionally Abused:   Marland Kitchen Physically Abused:   . Sexually Abused:     Vital Signs: Blood pressure (!) 147/63, pulse 97, temperature (!)  97.5 F (36.4 C), resp. rate 16, height 5\' 1"  (1.549 m), weight 144 lb 9.6 oz (65.6 kg), SpO2 90 %.  Examination: General Appearance: The patient is well-developed, well-nourished, and in no distress. Skin: Gross inspection of skin unremarkable. Head: normocephalic, no gross deformities. Eyes: no gross deformities noted. ENT: ears appear grossly normal no exudates. Neck: Supple. No thyromegaly. No LAD. Respiratory: clear bilaterally. Cardiovascular: Normal S1 and S2 without murmur or rub. Extremities: No cyanosis. pulses are equal. Neurologic: Alert and oriented. No involuntary movements.  LABS: No results found for this or any previous visit (from the past 2160 hour(s)).  Radiology: No results found.  No results found.  No results found.    Assessment and Plan: Patient Active Problem List   Diagnosis Date Noted  . Encounter for general adult medical examination with abnormal findings 09/08/2019  . Dysuria 09/08/2019  . Cerebrovascular disease 09/01/2019  . Carotid artery stenosis, symptomatic, right 08/01/2019  . Acute CVA (cerebrovascular accident) ([place]) 07/17/2019  . Peptic ulcer disease 02/17/2019  . Malnutrition of moderate degree 09/07/2018  . Acute on chronic respiratory failure with hypoxia ([place]) 09/05/2018   . Screening for breast cancer 07/19/2018  . HCAP (healthcare-associated pneumonia) 06/03/2018  . Chronic congestive heart failure with left ventricular diastolic dysfunction ([place]) 04/12/2018  . Obstructive chronic bronchitis without exacerbation ([place]) 02/17/2018  . Major depression, chronic 02/17/2018  . 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 Pollen allergies 01/16/2018  . CHF (congestive heart failure) ([place]) 05/28/2017  . Syncope 02/06/2017  . Pancreatic mass 02/06/2017  . Iron deficiency anemia 05/01/2016  . Anemia in chronic renal disease 04/26/2016  . Insomnia 06/12/2015  . CKD (chronic kidney disease) stage 2, GFR 60-89 ml/min 06/12/2015  . Hypercalcemia 06/12/2015  . Hypothyroidism 06/12/2015  . 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 Hyperlipidemia 06/12/2015  . Essential hypertension 06/12/2015  . Atherosclerotic heart disease 06/12/2015  . Solitary pulmonary nodule 06/12/2015    1. Obstructive chronic bronchitis without exacerbation ([place]) Stable, continue current mgmt with inhalers and other meds.  2. Chronic respiratory failure with hypoxia ([facility]) Continue to use oxygen as directed.   3. Oxygen dependent Continue to use oxygen 2-3 lpm.   4. Invasive aspergillosis ([place]) *continue current treatement by [facility] ILD clinic.  5. ILD (interstitial lung disease) ([place]) Continue to follow with [facility] ILD clinic.   6. Essential hypertension Continue to monitor.   General Counseling: I have discussed the findings of the evaluation and examination with Auriah.  I have also discussed any further diagnostic evaluation thatmay be needed or ordered today. Danashia verbalizes understanding of the findings of todays visit. We also reviewed her medications today and discussed drug interactions and side effects including but not limited excessive drowsiness and altered mental states. We also discussed that there is always a risk not just to her but also people around her. she has been  encouraged to call the office with any questions or concerns that should arise related to todays visit.  No orders of the defined types were placed in this encounter.    Time spent: 30 This patient was seen by Orson Gear AGNP-C in Collaboration with Dr. Devona Konig as a part of collaborative care agreement.   I have personally obtained a history, examined the patient, evaluated laboratory and imaging results, formulated the assessment and plan and placed orders.    Allyne Gee, MD [facility]monary and [facility] Sleep medicine

## 2020-03-25 ENCOUNTER — Other Ambulatory Visit: Payer: Self-pay

## 2020-03-25 DIAGNOSIS — I679 Cerebrovascular disease, unspecified: Secondary | ICD-10-CM

## 2020-03-25 DIAGNOSIS — J301 Allergic rhinitis due to pollen: Secondary | ICD-10-CM

## 2020-03-25 MED ORDER — LORATADINE 10 MG PO TABS: ORAL_TABLET | ORAL | 1 refills | Status: AC

## 2020-03-25 MED ORDER — SUCRALFATE 1 G PO TABS: 1.0000 g | ORAL_TABLET | Freq: Three times a day (TID) | ORAL | 3 refills | Status: AC

## 2020-03-25 MED ORDER — CLOPIDOGREL BISULFATE 75 MG PO TABS: 75.0000 mg | ORAL_TABLET | Freq: Every day | ORAL | 1 refills | Status: AC

## 2020-04-04 ENCOUNTER — Ambulatory Visit: Payer: Medicare Other | Admitting: Nurse Practitioner

## 2020-04-04 ENCOUNTER — Telehealth: Payer: Self-pay

## 2020-04-04 NOTE — Telephone Encounter (Signed)
Confirmed and screened for 04-08-20 ov. 

## 2020-04-08 ENCOUNTER — Ambulatory Visit (INDEPENDENT_AMBULATORY_CARE_PROVIDER_SITE_OTHER): Payer: Medicare Other | Admitting: Nurse Practitioner

## 2020-04-08 ENCOUNTER — Encounter: Payer: Self-pay | Admitting: Nurse Practitioner

## 2020-04-08 VITALS — BP 147/57 | HR 93 | Temp 97.5°F | Resp 16 | Ht 61.0 in | Wt 143.8 lb

## 2020-04-08 DIAGNOSIS — J449 Chronic obstructive pulmonary disease, unspecified: Secondary | ICD-10-CM

## 2020-04-08 DIAGNOSIS — I679 Cerebrovascular disease, unspecified: Secondary | ICD-10-CM | POA: Diagnosis not present

## 2020-04-08 DIAGNOSIS — Z9981 Dependence on supplemental oxygen: Secondary | ICD-10-CM

## 2020-04-08 DIAGNOSIS — I10 Essential (primary) hypertension: Secondary | ICD-10-CM

## 2020-04-08 NOTE — Progress Notes (Signed)
[facility] [place], [place] [address]  Internal MEDICINE  Office Visit Note  Patient Name: Melissa Knox  [id number]  [id number]  Date of Service: 04/23/2020  Chief Complaint  Patient presents with   Follow-up    bruises on left arm   Hyperlipidemia   Hypertension   Quality Metric Gaps    tetnaus hepC    The patient is here for routine follow up. She states that she has been getting more bruising. Coming up on upper arms. She states that she does not remember hitting or bumping her arm on anything. She is now taking plavix and states that bruises have started appearing since she started taking plavix.  The patient does have COPD and uses nasal cannula oxygen at 3 lpm.  -she has no new concerns or complaints today.      Current Medication: Outpatient Encounter Medications as of 04/08/2020  Medication Sig Note   albuterol (PROVENTIL) (2.5 MG/3ML) 0.083% nebulizer solution Take 3 mLs (2.5 mg total) by nebulization every 6 (six) hours as needed for wheezing or shortness of breath.    amLODipine (NORVASC) 10 MG tablet Take 1 tablet (10 mg total) by mouth daily.    aspirin 325 MG tablet Take 1 tablet (325 mg total) by mouth daily.    atorvastatin (LIPITOR) 10 MG tablet Take 1 tablet (10 mg total) by mouth daily at 6 PM.    budesonide-formoterol (SYMBICORT) 160-4.5 MCG/ACT inhaler Inhale 2 puffs into the lungs 2 (two) times daily.    carvedilol (COREG) 6.25 MG tablet Take 1 tablet (6.25 mg total) by mouth 2 (two) times daily.    clopidogrel (PLAVIX) 75 MG tablet Take 1 tablet (75 mg total) by mouth daily.    docusate sodium (COLACE) 100 MG capsule Take 1 capsule (100 mg total) by mouth 2 (two) times daily as needed for mild constipation. 07/17/2019: .   ferrous sulfate 325 (65 FE) MG tablet Take 1 tablet (325 mg total) by mouth 2 (two) times daily with a meal.    furosemide (LASIX) 20 MG tablet Take 1 tablet (20 mg total) by mouth daily.     levothyroxine (SYNTHROID) 50 MCG tablet Take 1 tablet (50 mcg total) by mouth daily before breakfast.    loratadine (CLARITIN) 10 MG tablet TAKE 1 TABLET BY MOUTH EVERY DAY FOR ALLERGIES AS NEEDED    losartan (COZAAR) 100 MG tablet Take 100 mg by mouth daily. 07/17/2019: .   meclizine (ANTIVERT) 12.5 MG tablet Take 12.5 mg by mouth 3 (three) times daily as needed for dizziness.  07/17/2019: .   mirtazapine (REMERON) 15 MG tablet TAKE 1/2 OF A TABLET (7.5 MG TOTAL) BY MOUTH AT BEDTIME.    oxyCODONE-acetaminophen (PERCOCET/ROXICET) 5-325 MG tablet Take 1 tablet by mouth every 6 (six) hours as needed for moderate pain.    OXYGEN Inhale 3 L into the lungs.    pantoprazole (PROTONIX) 40 MG tablet Take 1 tablet (40 mg total) by mouth 2 (two) times daily.    sucralfate (CARAFATE) 1 g tablet Take 1 tablet (1 g total) by mouth 4 (four) times daily -  with meals and at bedtime.    No facility-administered encounter medications on file as of 04/08/2020.    Surgical History: Past Surgical History:  Procedure Laterality Date   CAROTID ENDARTERECTOMY Right 08/01/2019   ENDARTERECTOMY Right 08/01/2019   Procedure: ENDARTERECTOMY CAROTID;  Surgeon: Marty Heck, MD;  Location: [facility];  Service: Vascular;  Laterality: Right;  FRACTURE SURGERY     right ankle   GALLBLADDER SURGERY  2012   IR ARCH CERVICICEBRAL NON-SEL (MS)  07/19/2019   MULTIPLE TOOTH EXTRACTIONS     PATCH ANGIOPLASTY Right 08/01/2019   Procedure: Patch Angioplasty;  Surgeon: Marty Heck, MD;  Location: [facility] OR;  Service: Vascular;  Laterality: Right;   TUBAL LIGATION      Medical History: Past Medical History:  Diagnosis Date   Atherosclerotic heart disease 06/12/2015   CHF (congestive heart failure) (HCC)    Chronic congestive heart failure with left ventricular diastolic dysfunction ([place]) 04/12/2018   CKD (chronic kidney disease) stage 2, GFR 60-89 ml/min 06/12/2015   COPD (chronic obstructive  pulmonary disease) ([place])    Full dentures    Hypercalcemia 06/12/2015   Hyperlipidemia 06/12/2015   Hypertension 06/12/2015   Hypothyroidism 06/12/2015   Insomnia 06/12/2015   Interstitial pneumonia ([place]) 04/12/2018   On supplemental oxygen by nasal cannula    at HS and PRN   Pulmonary heart disease ([place]) 06/12/2015   Shortness of breath 06/12/2015   Solitary pulmonary nodule 06/12/2015   Wears glasses     Family History: Family History  Problem Relation Age of Onset   Cancer Mother    Hypertension Father    Diabetes Father    Cancer Father     Social History   Socioeconomic History   Marital status: Married    Spouse name: Daryl   Number of children: 3   Years of education: 12   Highest education level: 12th grade  Occupational History   Not on file  Tobacco Use   Smoking status: Former Smoker    Types: Cigarettes    Quit date: 2013    Years since quitting: 8.6   Smokeless tobacco: Never Used  Scientific laboratory technician Use: Never used  Substance and Sexual Activity   Alcohol use: No    Alcohol/week: 0.0 standard drinks   Drug use: No   Sexual activity: Yes    Birth control/protection: None  Other Topics Concern   Not on file  Social History Narrative   Not on file   Social Determinants of Health   Financial Resource Strain:    Difficulty of Paying Living Expenses: Not on file  Food Insecurity:    Worried About Charity fundraiser in the Last Year: Not on file   [employer] of Food in the Last Year: Not on file  Transportation Needs:    Lack of Transportation (Medical): Not on file   Lack of Transportation (Non-Medical): Not on file  Physical Activity:    Days of Exercise per Week: Not on file   Minutes of Exercise per Session: Not on file  Stress:    Feeling of Stress : Not on file  Social Connections:    Frequency of Communication with Friends and Family: Not on file   Frequency of Social Gatherings with Friends and  Family: Not on file   Attends Religious Services: Not on file   Active Member of Clubs or Organizations: Not on file   Attends Archivist Meetings: Not on file   Marital Status: Not on file  Intimate Partner Violence:    Fear of Current or Ex-Partner: Not on file   Emotionally Abused: Not on file   Physically Abused: Not on file   Sexually Abused: Not on file      Review of Systems  Constitutional: Negative for activity change, chills, fatigue and unexpected weight change.  HENT: Negative for congestion, postnasal drip, rhinorrhea, sneezing and sore throat.   Respiratory: Positive for shortness of breath and wheezing. Negative for cough and chest tightness.        Intermittent episodes SOB/wheezing.   Cardiovascular: Negative for chest pain and palpitations.  Gastrointestinal: Negative for abdominal pain, constipation, diarrhea, nausea and vomiting.  Endocrine: Negative for cold intolerance, heat intolerance, polydipsia and polyuria.  Musculoskeletal: Negative for arthralgias, back pain, joint swelling and neck pain.  Skin: Negative for rash.  Allergic/Immunologic: Negative for environmental allergies.  Neurological: Negative for dizziness, tremors, numbness and headaches.  Hematological: Negative for adenopathy. Does not bruise/bleed easily.  Psychiatric/Behavioral: Negative for behavioral problems (Depression), sleep disturbance and suicidal ideas. The patient is not nervous/anxious.     Today's Vitals   04/08/20 1030  BP: (!) 147/57  Pulse: 93  Resp: 16  Temp: (!) 97.5 F (36.4 C)  SpO2: 94%  Weight: 143 lb 12.8 oz (65.2 kg)  Height: 5\' 1"  (1.549 m)   Body mass index is 27.17 kg/m.  Physical Exam Vitals and nursing note reviewed.  Constitutional:      General: She is not in acute distress.    Appearance: Normal appearance. She is well-developed. She is not diaphoretic.  HENT:     Head: Normocephalic and atraumatic.     Nose: Nose normal.      Mouth/Throat:     Pharynx: No oropharyngeal exudate.  Eyes:     Conjunctiva/sclera: Conjunctivae normal.     Pupils: Pupils are equal, round, and reactive to light.  Neck:     Thyroid: No thyromegaly.     Vascular: No JVD.     Trachea: No tracheal deviation.  Cardiovascular:     Rate and Rhythm: Normal rate and regular rhythm.     Heart sounds: Murmur heard.  No friction rub. No gallop.   Pulmonary:     Effort: Pulmonary effort is normal. No respiratory distress.     Breath sounds: Normal breath sounds. Decreased air movement present. No wheezing or rales.     Comments: Diminished breath sounds at bilateral lung bases. Patient using oxygen via nasal cannula at 3 lpm.  Chest:     Chest wall: No tenderness.  Abdominal:     Palpations: Abdomen is soft.     Tenderness: There is no abdominal tenderness.  Musculoskeletal:        General: Normal range of motion.     Cervical back: Normal range of motion and neck supple.  Lymphadenopathy:     Cervical: No cervical adenopathy.  Skin:    General: Skin is warm and dry.     Capillary Refill: Capillary refill takes 2 to 3 seconds.     Comments: Bruising present on both forearms and upper arms.   Neurological:     Mental Status: She is alert and oriented to person, place, and time.     Cranial Nerves: No cranial nerve deficit.  Psychiatric:        Behavior: Behavior normal.        Thought Content: Thought content normal.        Judgment: Judgment normal.   Assessment/Plan: 1. Essential hypertension Generally stable. Continue bp medication as prescribed   2. Obstructive chronic bronchitis without exacerbation (HCC) Stable. Continue inhalers and respiratory medication as prescribed   3. Oxygen dependent Patient on oxygen use 24/7 at three lpm  4. Cerebrovascular disease Continue plavix and regular visits with vein and vascular as scheduled.   General Counseling:  Desirre verbalizes understanding of the findings of todays visit and  agrees with plan of treatment. I have discussed any further diagnostic evaluation that may be needed or ordered today. We also reviewed her medications today. she has been encouraged to call the office with any questions or concerns that should arise related to todays visit.  This patient was seen by Leretha Pol FNP Collaboration with Dr Lavera Guise as a part of collaborative care agreement   Total time spent: 25 Minutes    Time spent includes review of chart, medications, test results, and follow up plan with the patient.      Dr Lavera Guise Internal medicine

## 2020-05-06 ENCOUNTER — Other Ambulatory Visit: Payer: Self-pay

## 2020-05-06 DIAGNOSIS — I10 Essential (primary) hypertension: Secondary | ICD-10-CM

## 2020-05-06 MED ORDER — AMLODIPINE BESYLATE 10 MG PO TABS: 10.0000 mg | ORAL_TABLET | Freq: Every day | ORAL | 5 refills | Status: AC

## 2020-05-16 ENCOUNTER — Other Ambulatory Visit: Payer: Self-pay | Admitting: Adult Health

## 2020-05-16 DIAGNOSIS — I10 Essential (primary) hypertension: Secondary | ICD-10-CM

## 2020-05-20 ENCOUNTER — Other Ambulatory Visit: Payer: Self-pay

## 2020-05-20 ENCOUNTER — Inpatient Hospital Stay
Admission: EM | Admit: 2020-05-20 | Discharge: 2020-05-22 | DRG: 190 | Disposition: A | Discharge status: Home Health | Payer: Medicare Other | Attending: Internal Medicine | Admitting: Internal Medicine

## 2020-05-20 ENCOUNTER — Emergency Department: Payer: Medicare Other

## 2020-05-20 DIAGNOSIS — E876 Hypokalemia: Secondary | ICD-10-CM | POA: Diagnosis not present

## 2020-05-20 DIAGNOSIS — I5032 Chronic diastolic (congestive) heart failure: Secondary | ICD-10-CM | POA: Diagnosis present

## 2020-05-20 DIAGNOSIS — Y92239 Unspecified place in hospital as the place of occurrence of the external cause: Secondary | ICD-10-CM | POA: Diagnosis not present

## 2020-05-20 DIAGNOSIS — J9620 Acute and chronic respiratory failure, unspecified whether with hypoxia or hypercapnia: Secondary | ICD-10-CM | POA: Diagnosis present

## 2020-05-20 DIAGNOSIS — R410 Disorientation, unspecified: Secondary | ICD-10-CM | POA: Diagnosis not present

## 2020-05-20 DIAGNOSIS — I959 Hypotension, unspecified: Secondary | ICD-10-CM | POA: Diagnosis not present

## 2020-05-20 DIAGNOSIS — Z7982 Long term (current) use of aspirin: Secondary | ICD-10-CM

## 2020-05-20 DIAGNOSIS — F329 Major depressive disorder, single episode, unspecified: Secondary | ICD-10-CM | POA: Diagnosis present

## 2020-05-20 DIAGNOSIS — Z9981 Dependence on supplemental oxygen: Secondary | ICD-10-CM

## 2020-05-20 DIAGNOSIS — Z23 Encounter for immunization: Secondary | ICD-10-CM

## 2020-05-20 DIAGNOSIS — I130 Hypertensive heart and chronic kidney disease with heart failure and stage 1 through stage 4 chronic kidney disease, or unspecified chronic kidney disease: Secondary | ICD-10-CM | POA: Diagnosis present

## 2020-05-20 DIAGNOSIS — J441 Chronic obstructive pulmonary disease with (acute) exacerbation: Principal | ICD-10-CM | POA: Diagnosis present

## 2020-05-20 DIAGNOSIS — D631 Anemia in chronic kidney disease: Secondary | ICD-10-CM | POA: Diagnosis not present

## 2020-05-20 DIAGNOSIS — R0602 Shortness of breath: Secondary | ICD-10-CM | POA: Diagnosis not present

## 2020-05-20 DIAGNOSIS — Z20822 Contact with and (suspected) exposure to covid-19: Secondary | ICD-10-CM | POA: Diagnosis not present

## 2020-05-20 DIAGNOSIS — J849 Interstitial pulmonary disease, unspecified: Secondary | ICD-10-CM | POA: Diagnosis present

## 2020-05-20 DIAGNOSIS — E039 Hypothyroidism, unspecified: Secondary | ICD-10-CM | POA: Diagnosis present

## 2020-05-20 DIAGNOSIS — G4700 Insomnia, unspecified: Secondary | ICD-10-CM | POA: Diagnosis present

## 2020-05-20 DIAGNOSIS — I10 Essential (primary) hypertension: Secondary | ICD-10-CM | POA: Diagnosis present

## 2020-05-20 DIAGNOSIS — N183 Chronic kidney disease, stage 3 unspecified: Secondary | ICD-10-CM | POA: Diagnosis not present

## 2020-05-20 DIAGNOSIS — R0902 Hypoxemia: Secondary | ICD-10-CM | POA: Diagnosis not present

## 2020-05-20 DIAGNOSIS — I2510 Atherosclerotic heart disease of native coronary artery without angina pectoris: Secondary | ICD-10-CM | POA: Diagnosis present

## 2020-05-20 DIAGNOSIS — J439 Emphysema, unspecified: Secondary | ICD-10-CM | POA: Diagnosis not present

## 2020-05-20 DIAGNOSIS — Z79899 Other long term (current) drug therapy: Secondary | ICD-10-CM

## 2020-05-20 DIAGNOSIS — Z833 Family history of diabetes mellitus: Secondary | ICD-10-CM

## 2020-05-20 DIAGNOSIS — B440 Invasive pulmonary aspergillosis: Secondary | ICD-10-CM | POA: Diagnosis present

## 2020-05-20 DIAGNOSIS — J9621 Acute and chronic respiratory failure with hypoxia: Secondary | ICD-10-CM | POA: Diagnosis present

## 2020-05-20 DIAGNOSIS — Z7951 Long term (current) use of inhaled steroids: Secondary | ICD-10-CM

## 2020-05-20 DIAGNOSIS — R7989 Other specified abnormal findings of blood chemistry: Secondary | ICD-10-CM | POA: Diagnosis present

## 2020-05-20 DIAGNOSIS — Z7902 Long term (current) use of antithrombotics/antiplatelets: Secondary | ICD-10-CM

## 2020-05-20 DIAGNOSIS — K279 Peptic ulcer, site unspecified, unspecified as acute or chronic, without hemorrhage or perforation: Secondary | ICD-10-CM

## 2020-05-20 DIAGNOSIS — R404 Transient alteration of awareness: Secondary | ICD-10-CM | POA: Diagnosis not present

## 2020-05-20 DIAGNOSIS — J90 Pleural effusion, not elsewhere classified: Secondary | ICD-10-CM | POA: Diagnosis not present

## 2020-05-20 DIAGNOSIS — E785 Hyperlipidemia, unspecified: Secondary | ICD-10-CM | POA: Diagnosis present

## 2020-05-20 DIAGNOSIS — Z8249 Family history of ischemic heart disease and other diseases of the circulatory system: Secondary | ICD-10-CM

## 2020-05-20 DIAGNOSIS — T502X5A Adverse effect of carbonic-anhydrase inhibitors, benzothiadiazides and other diuretics, initial encounter: Secondary | ICD-10-CM | POA: Diagnosis not present

## 2020-05-20 DIAGNOSIS — Z79890 Hormone replacement therapy (postmenopausal): Secondary | ICD-10-CM

## 2020-05-20 DIAGNOSIS — Z87891 Personal history of nicotine dependence: Secondary | ICD-10-CM

## 2020-05-20 DIAGNOSIS — N189 Chronic kidney disease, unspecified: Secondary | ICD-10-CM | POA: Diagnosis present

## 2020-05-20 LAB — COMPREHENSIVE METABOLIC PANEL
ALT: 13 U/L (ref 0–44)
AST: 16 U/L (ref 15–41)
Albumin: 2.5 g/dL — ABNORMAL LOW (ref 3.5–5.0)
Alkaline Phosphatase: 96 U/L (ref 38–126)
Anion gap: 11 (ref 5–15)
BUN: 16 mg/dL (ref 8–23)
CO2: 30 mmol/L (ref 22–32)
Calcium: 7.5 mg/dL — ABNORMAL LOW (ref 8.9–10.3)
Chloride: 100 mmol/L (ref 98–111)
Creatinine, Ser: 1.37 mg/dL — ABNORMAL HIGH (ref 0.44–1.00)
GFR calc Af Amer: 46 mL/min — ABNORMAL LOW (ref >60)
GFR calc non Af Amer: 40 mL/min — ABNORMAL LOW (ref >60)
Glucose, Bld: 103 mg/dL — ABNORMAL HIGH (ref 70–99)
Potassium: 2.5 mmol/L — CL (ref 3.5–5.1)
Sodium: 141 mmol/L (ref 135–145)
Total Bilirubin: 0.6 mg/dL (ref 0.3–1.2)
Total Protein: 6.5 g/dL (ref 6.5–8.1)

## 2020-05-20 LAB — CBC
HCT: 25.3 % — ABNORMAL LOW (ref 36.0–46.0)
Hemoglobin: 7.7 g/dL — ABNORMAL LOW (ref 12.0–15.0)
MCH: 28.8 pg (ref 26.0–34.0)
MCHC: 30.4 g/dL (ref 30.0–36.0)
MCV: 94.8 fL (ref 80.0–100.0)
Platelets: 417 10*3/uL — ABNORMAL HIGH (ref 150–400)
RBC: 2.67 MIL/uL — ABNORMAL LOW (ref 3.87–5.11)
RDW: 14.2 % (ref 11.5–15.5)
WBC: 11.5 10*3/uL — ABNORMAL HIGH (ref 4.0–10.5)
nRBC: 0 % (ref 0.0–0.2)

## 2020-05-20 LAB — GLUCOSE, CAPILLARY: Glucose-Capillary: 114 mg/dL — ABNORMAL HIGH (ref 70–99)

## 2020-05-20 LAB — SARS CORONAVIRUS 2 BY RT PCR (HOSPITAL ORDER, PERFORMED IN ~~LOC~~ HOSPITAL LAB): SARS Coronavirus 2: NEGATIVE

## 2020-05-20 LAB — MAGNESIUM: Magnesium: 1.5 mg/dL — ABNORMAL LOW (ref 1.7–2.4)

## 2020-05-20 MED ORDER — AMLODIPINE BESYLATE 10 MG PO TABS: 10.0000 mg | ORAL_TABLET | Freq: Every day | ORAL | Status: DC

## 2020-05-20 MED ORDER — METHYLPREDNISOLONE SODIUM SUCC 125 MG IJ SOLR
125.0000 mg | Freq: Once | INTRAMUSCULAR | Status: AC
  Administered 2020-05-20: 125 mg via INTRAVENOUS
  Filled 2020-05-20: qty 2

## 2020-05-20 MED ORDER — DOCUSATE SODIUM 100 MG PO CAPS: 100.0000 mg | ORAL_CAPSULE | Freq: Two times a day (BID) | ORAL | Status: DC | PRN

## 2020-05-20 MED ORDER — LOSARTAN POTASSIUM 50 MG PO TABS
100.0000 mg | ORAL_TABLET | Freq: Every day | ORAL | Status: DC
  Administered 2020-05-21 – 2020-05-22 (×2): 100 mg via ORAL
  Filled 2020-05-20 (×2): qty 2

## 2020-05-20 MED ORDER — MAGNESIUM SULFATE 2 GM/50ML IV SOLN: 2.0000 g | Freq: Once | INTRAVENOUS | Status: DC

## 2020-05-20 MED ORDER — PNEUMOCOCCAL VAC POLYVALENT 25 MCG/0.5ML IJ INJ
0.5000 mL | INJECTION | INTRAMUSCULAR | Status: AC
  Administered 2020-05-21: 0.5 mL via INTRAMUSCULAR
  Filled 2020-05-20: qty 0.5

## 2020-05-20 MED ORDER — SODIUM CHLORIDE 0.9 % IV SOLN: INTRAVENOUS | Status: DC

## 2020-05-20 MED ORDER — POTASSIUM CHLORIDE CRYS ER 20 MEQ PO TBCR
40.0000 meq | EXTENDED_RELEASE_TABLET | Freq: Once | ORAL | Status: DC
  Filled 2020-05-20: qty 2

## 2020-05-20 MED ORDER — IPRATROPIUM-ALBUTEROL 0.5-2.5 (3) MG/3ML IN SOLN
RESPIRATORY_TRACT | Status: AC
  Administered 2020-05-20: 3 mL via RESPIRATORY_TRACT
  Filled 2020-05-20: qty 6

## 2020-05-20 MED ORDER — POTASSIUM CHLORIDE CRYS ER 20 MEQ PO TBCR
40.0000 meq | EXTENDED_RELEASE_TABLET | Freq: Two times a day (BID) | ORAL | Status: DC
  Administered 2020-05-20 – 2020-05-22 (×5): 40 meq via ORAL
  Filled 2020-05-20 (×4): qty 2

## 2020-05-20 MED ORDER — MECLIZINE HCL 12.5 MG PO TABS
12.5000 mg | ORAL_TABLET | Freq: Three times a day (TID) | ORAL | Status: DC | PRN
  Filled 2020-05-20: qty 0.5

## 2020-05-20 MED ORDER — IPRATROPIUM-ALBUTEROL 0.5-2.5 (3) MG/3ML IN SOLN: 3.0000 mL | Freq: Four times a day (QID) | RESPIRATORY_TRACT | Status: DC | PRN

## 2020-05-20 MED ORDER — PANTOPRAZOLE SODIUM 40 MG PO TBEC
40.0000 mg | DELAYED_RELEASE_TABLET | Freq: Two times a day (BID) | ORAL | Status: DC
  Administered 2020-05-20 – 2020-05-22 (×4): 40 mg via ORAL
  Filled 2020-05-20 (×4): qty 1

## 2020-05-20 MED ORDER — IPRATROPIUM-ALBUTEROL 0.5-2.5 (3) MG/3ML IN SOLN
3.0000 mL | Freq: Once | RESPIRATORY_TRACT | Status: AC
  Administered 2020-05-20: 3 mL via RESPIRATORY_TRACT

## 2020-05-20 MED ORDER — LORATADINE 10 MG PO TABS: 10.0000 mg | ORAL_TABLET | Freq: Every day | ORAL | Status: DC | PRN

## 2020-05-20 MED ORDER — METHYLPREDNISOLONE SODIUM SUCC 40 MG IJ SOLR
40.0000 mg | Freq: Two times a day (BID) | INTRAMUSCULAR | Status: AC
  Administered 2020-05-20 – 2020-05-21 (×2): 40 mg via INTRAVENOUS
  Filled 2020-05-20 (×2): qty 1

## 2020-05-20 MED ORDER — POTASSIUM CHLORIDE CRYS ER 20 MEQ PO TBCR: 40.0000 meq | EXTENDED_RELEASE_TABLET | Freq: Three times a day (TID) | ORAL | Status: DC

## 2020-05-20 MED ORDER — ATORVASTATIN CALCIUM 10 MG PO TABS
10.0000 mg | ORAL_TABLET | Freq: Every day | ORAL | Status: DC
  Administered 2020-05-20 – 2020-05-21 (×2): 10 mg via ORAL
  Filled 2020-05-20 (×2): qty 1

## 2020-05-20 MED ORDER — CARVEDILOL 3.125 MG PO TABS
6.2500 mg | ORAL_TABLET | Freq: Two times a day (BID) | ORAL | Status: DC
  Administered 2020-05-20 – 2020-05-22 (×4): 6.25 mg via ORAL
  Filled 2020-05-20 (×4): qty 2

## 2020-05-20 MED ORDER — INFLUENZA VAC A&B SA ADJ QUAD 0.5 ML IM PRSY
0.5000 mL | PREFILLED_SYRINGE | INTRAMUSCULAR | Status: AC
  Administered 2020-05-21: 0.5 mL via INTRAMUSCULAR
  Filled 2020-05-20: qty 0.5

## 2020-05-20 MED ORDER — LEVOTHYROXINE SODIUM 50 MCG PO TABS
50.0000 ug | ORAL_TABLET | Freq: Every day | ORAL | Status: DC
  Administered 2020-05-21 – 2020-05-22 (×2): 50 ug via ORAL
  Filled 2020-05-20 (×2): qty 1

## 2020-05-20 MED ORDER — FUROSEMIDE 20 MG PO TABS
20.0000 mg | ORAL_TABLET | Freq: Every day | ORAL | Status: DC
  Administered 2020-05-21 – 2020-05-22 (×2): 20 mg via ORAL
  Filled 2020-05-20 (×2): qty 1

## 2020-05-20 MED ORDER — MIRTAZAPINE 15 MG PO TABS
7.5000 mg | ORAL_TABLET | Freq: Every day | ORAL | Status: DC
  Administered 2020-05-20 – 2020-05-21 (×2): 7.5 mg via ORAL
  Filled 2020-05-20 (×2): qty 1

## 2020-05-20 MED ORDER — OXYCODONE-ACETAMINOPHEN 5-325 MG PO TABS: 1.0000 | ORAL_TABLET | Freq: Four times a day (QID) | ORAL | Status: DC | PRN

## 2020-05-20 MED ORDER — SUCRALFATE 1 G PO TABS
1.0000 g | ORAL_TABLET | Freq: Three times a day (TID) | ORAL | Status: DC
  Administered 2020-05-20 – 2020-05-22 (×8): 1 g via ORAL
  Filled 2020-05-20 (×8): qty 1

## 2020-05-20 MED ORDER — PREDNISONE 20 MG PO TABS
40.0000 mg | ORAL_TABLET | Freq: Every day | ORAL | Status: DC
  Administered 2020-05-22: 40 mg via ORAL
  Filled 2020-05-20: qty 2

## 2020-05-20 MED ORDER — MAGNESIUM SULFATE 2 GM/50ML IV SOLN
2.0000 g | Freq: Once | INTRAVENOUS | Status: AC
  Administered 2020-05-20: 2 g via INTRAVENOUS
  Filled 2020-05-20: qty 50

## 2020-05-20 MED ORDER — FERROUS SULFATE 325 (65 FE) MG PO TABS
325.0000 mg | ORAL_TABLET | Freq: Two times a day (BID) | ORAL | Status: DC
  Administered 2020-05-20 – 2020-05-22 (×4): 325 mg via ORAL
  Filled 2020-05-20 (×5): qty 1

## 2020-05-20 MED ORDER — IPRATROPIUM-ALBUTEROL 0.5-2.5 (3) MG/3ML IN SOLN: 3.0000 mL | Freq: Once | RESPIRATORY_TRACT | Status: AC

## 2020-05-20 NOTE — ED Notes (Signed)
ED Provider at bedside. 

## 2020-05-20 NOTE — ED Triage Notes (Signed)
Pt arrived via [facility] from home with AMS. Pt has hx of copd, 3L of oxygen. Pt took her grandaughter to school but did not bring her oxygen with her. Pt admits to recieving platelets for low hbg and missed Pt  her appt to have her platelets given. O2 sats in the low 80s with Fire. Pt states this has happened before but not to this extent. Pt NAD on arrival.
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
## 2020-05-20 NOTE — H&P (Signed)
History and Physical    Melissa Knox LYY:[id number] DOB: February 07, 1951 DOA: 05/20/2020  PCP: Ronnell Freshwater, NP   Patient coming from: Home  I have personally briefly reviewed patient's old medical records in [place]  Chief Complaint: Weakness  HPI: Melissa Knox is a 69 y.o. female with medical history significant for chronic respiratory failure on 3 L of oxygen, COPD, hypothyroidism, hypertension, chronic diastolic dysfunction CHF who was brought into the ER by EMS for evaluation of weakness and hypoxia.  Patient stated that she had taken her granddaughter to work and typically drives without oxygen.  After dropping her granddaughter off she arrived home but was unable to get out of her car because her legs felt heavy and she had generalized weakness.  Her husband called out to the neighbors who came out to the car but were unable to get her out and so they called EMS Patient was found to be hypoxic and on 3 L her pulse oximetry was in the low to mid 80s.  She was transported to the ER on 5 L of oxygen. She has a cough productive of clear phlegm and has shortness of breath but no change from her baseline.  She denies having any fever or chills, no chest pain, no nausea, no vomiting, no changes in her bowel habits, no dizziness or lightheadedness. Labs show sodium 141, potassium 2.5, chloride 100, bicarb 13, BUN 16, creatinine 1.37, calcium 7.5, alkaline phosphatase 96, albumin 2.5, AST 16, ALT 13, white count 11.5, hemoglobin 7.7, hematocrit 25.7, MCV 94.8, RDW 14.2, platelet count 417 Patient's COVID-19 PCR test is negative Chest x-ray reviewed by me shows chronic changes.  No obvious infiltrate Twelve-lead EKG shows sinus tachycardia    ED Course: Patient is a 69 year old female with a history of COPD and chronic respiratory failure on 3 L of oxygen, chronic diastolic dysfunction CHF on diuretic therapy who presents to the ER by EMS for evaluation of weakness and  hypoxia.  Patient was noted to be hypoxic with pulse oximetry on 3 L in the mid 80s, she is currently on 5 L of oxygen to maintain pulse oximetry greater than 92%.   Patient also noted to have significant hypokalemia with no EKG changes. She was referred to observation status for further evaluation.  Review of Systems: As per HPI otherwise 10 point review of systems negative.    Past Medical History:  Diagnosis Date  . Atherosclerotic heart disease 06/12/2015  . CHF (congestive heart failure) ([place])   . Chronic congestive heart failure with left ventricular diastolic dysfunction ([place]) 04/12/2018  . CKD (chronic kidney disease) stage 2, GFR 60-89 ml/min 06/12/2015  . COPD (chronic obstructive pulmonary disease) ([place])   . Full dentures   . Hypercalcemia 06/12/2015  . Hyperlipidemia 06/12/2015  . Hypertension 06/12/2015  . Hypothyroidism 06/12/2015  . Insomnia 06/12/2015  . Interstitial pneumonia ([place]) 04/12/2018  . On supplemental oxygen by nasal cannula    at HS and PRN  . Pulmonary heart disease ([place]) 06/12/2015  . Shortness of breath 06/12/2015  . Solitary pulmonary nodule 06/12/2015  . Wears glasses     Past Surgical History:  Procedure Laterality Date  . CAROTID ENDARTERECTOMY Right 08/01/2019  . ENDARTERECTOMY Right 08/01/2019   Procedure: ENDARTERECTOMY CAROTID;  Surgeon: Marty Heck, MD;  Location: [facility];  Service: Vascular;  Laterality: Right;  . FRACTURE SURGERY     right ankle  . GALLBLADDER SURGERY  2012  . IR ARCH CERVICICEBRAL NON-SEL (  [place])  07/19/2019  . MULTIPLE TOOTH EXTRACTIONS    . PATCH ANGIOPLASTY Right 08/01/2019   Procedure: Patch Angioplasty;  Surgeon: Marty Heck, MD;  Location: [facility];  Service: Vascular;  Laterality: Right;  . TUBAL LIGATION       reports that she quit smoking about 8 years ago. Her smoking use included cigarettes. She has never used smokeless tobacco. She reports that she does not drink alcohol and does not use  drugs.  No Known Allergies  Family History  Problem Relation Age of Onset  . Cancer Mother   . Hypertension Father   . Diabetes Father   . Cancer Father      Prior to Admission medications   Medication Sig Start Date End Date Taking? Authorizing Provider  albuterol (PROVENTIL) (2.5 MG/3ML) 0.083% nebulizer solution Take 3 mLs (2.5 mg total) by nebulization every 6 (six) hours as needed for wheezing or shortness of breath. 09/27/19  Yes Scarboro, Audie Clear, NP  amLODipine (NORVASC) 10 MG tablet Take 1 tablet (10 mg total) by mouth daily. 05/06/20  Yes Ronnell Freshwater, NP  aspirin 325 MG tablet Take 1 tablet (325 mg total) by mouth daily. 10/11/19  Yes Scarboro, Audie Clear, NP  atorvastatin (LIPITOR) 10 MG tablet Take 1 tablet (10 mg total) by mouth daily at 6 PM. 09/24/19  Yes Boscia, Greer Ee, NP  budesonide-formoterol (SYMBICORT) 160-4.5 MCG/ACT inhaler Inhale 2 puffs into the lungs 2 (two) times daily. 09/27/19  Yes Scarboro, Audie Clear, NP  carvedilol (COREG) 6.25 MG tablet TAKE 1 TABLET BY MOUTH TWICE A DAY Patient taking differently: Take 6.25 mg by mouth in the morning and at bedtime.  05/16/20  Yes Scarboro, Audie Clear, NP  clopidogrel (PLAVIX) 75 MG tablet Take 1 tablet (75 mg total) by mouth daily. 03/25/20  Yes Boscia, Greer Ee, NP  docusate sodium (COLACE) 100 MG capsule Take 1 capsule (100 mg total) by mouth 2 (two) times daily as needed for mild constipation. 04/14/18  Yes Gouru, Illene Silver, MD  ferrous sulfate 325 (65 FE) MG tablet Take 1 tablet (325 mg total) by mouth 2 (two) times daily with a meal. 02/15/20  Yes Boscia, Heather E, NP  furosemide (LASIX) 20 MG tablet Take 1 tablet (20 mg total) by mouth daily. 01/11/20  Yes Ronnell Freshwater, NP  levothyroxine (SYNTHROID) 50 MCG tablet Take 1 tablet (50 mcg total) by mouth daily before breakfast. 02/27/20  Yes Boscia, Heather E, NP  loratadine (CLARITIN) 10 MG tablet TAKE 1 TABLET BY MOUTH EVERY DAY FOR ALLERGIES AS NEEDED Patient taking differently:  Take 10 mg by mouth daily as needed for allergies.  03/25/20  Yes Boscia, Greer Ee, NP  losartan (COZAAR) 100 MG tablet Take 100 mg by mouth daily.   Yes Lateef, Munsoor, MD  meclizine (ANTIVERT) 12.5 MG tablet Take 12.5 mg by mouth 3 (three) times daily as needed for dizziness.  06/02/16  Yes [provider]  mirtazapine (REMERON) 15 MG tablet TAKE 1/2 OF A TABLET (7.5 MG TOTAL) BY MOUTH AT BEDTIME. Patient taking differently: Take 7.5 mg by mouth at bedtime.  11/22/19  Yes Scarboro, Audie Clear, NP  pantoprazole (PROTONIX) 40 MG tablet Take 1 tablet (40 mg total) by mouth 2 (two) times daily. 02/04/20  Yes Boscia, Heather E, NP  sucralfate (CARAFATE) 1 g tablet Take 1 tablet (1 g total) by mouth 4 (four) times daily -  with meals and at bedtime. 03/25/20  Yes Ronnell Freshwater, NP  Physical Exam: Vitals:   05/20/20 1111 05/20/20 1330 05/20/20 1400 05/20/20 1512  BP:  124/64 (!) 116/54 123/61  Pulse: (!) 107 91 89 96  Resp: 18 (!) 24 (!) 25 16  Temp:    98.5 F (36.9 C)  TempSrc:    Oral  SpO2: 94% 100% 100% 100%  Weight:    65.4 kg  Height:    5\' 1"  (1.549 m)     Vitals:   05/20/20 1111 05/20/20 1330 05/20/20 1400 05/20/20 1512  BP:  124/64 (!) 116/54 123/61  Pulse: (!) 107 91 89 96  Resp: 18 (!) 24 (!) 25 16  Temp:    98.5 F (36.9 C)  TempSrc:    Oral  SpO2: 94% 100% 100% 100%  Weight:    65.4 kg  Height:    5\' 1"  (1.549 m)    Constitutional: NAD, alert and oriented x 3 Eyes: PERRL, lids and conjunctivae pallor ENMT: Mucous membranes are moist.  Neck: normal, supple, no masses, no thyromegaly Respiratory: clear to auscultation bilaterally, scattered wheezing, no crackles. Normal respiratory effort. No accessory muscle use.  Cardiovascular: Tachycardic,no murmurs / rubs / gallops. No extremity edema. 2+ pedal pulses. No carotid bruits.  Abdomen: no tenderness, no masses palpated. No hepatosplenomegaly. Bowel sounds positive.  Musculoskeletal: no clubbing / cyanosis. No  joint deformity upper and lower extremities.  Skin: no rashes, lesions, ulcers.  Neurologic: No gross focal neurologic deficit. Psychiatric: Normal mood and affect.   Labs on Admission: I have personally reviewed following labs and imaging studies  CBC: Recent Labs  Lab 05/20/20 1109  WBC 11.5*  HGB 7.7*  HCT 25.3*  MCV 94.8  PLT 426*   Basic Metabolic Panel: Recent Labs  Lab 05/20/20 1109  NA 141  K 2.5*  CL 100  CO2 30  GLUCOSE 103*  BUN 16  CREATININE 1.37*  CALCIUM 7.5*  MG 1.5*   GFR: Estimated Creatinine Clearance: 34 mL/min (A) (by C-G formula based on SCr of 1.37 mg/dL (H)). Liver Function Tests: Recent Labs  Lab 05/20/20 1109  AST 16  ALT 13  ALKPHOS 96  BILITOT 0.6  PROT 6.5  ALBUMIN 2.5*   No results for input(s): LIPASE, AMYLASE in the last 168 hours. No results for input(s): AMMONIA in the last 168 hours. Coagulation Profile: No results for input(s): INR, PROTIME in the last 168 hours. Cardiac Enzymes: No results for input(s): CKTOTAL, CKMB, CKMBINDEX, TROPONINI in the last 168 hours. BNP (last 3 results) No results for input(s): PROBNP in the last 8760 hours. HbA1C: No results for input(s): HGBA1C in the last 72 hours. CBG: Recent Labs  Lab 05/20/20 1118  GLUCAP 114*   Lipid Profile: No results for input(s): CHOL, HDL, LDLCALC, TRIG, CHOLHDL, LDLDIRECT in the last 72 hours. Thyroid Function Tests: No results for input(s): TSH, T4TOTAL, FREET4, T3FREE, THYROIDAB in the last 72 hours. Anemia Panel: No results for input(s): VITAMINB12, FOLATE, FERRITIN, TIBC, IRON, RETICCTPCT in the last 72 hours. Urine analysis:    Component Value Date/Time   COLORURINE YELLOW 08/01/2019 1028   APPEARANCEUR CLEAR 08/01/2019 1028   APPEARANCEUR Clear 07/17/2018 1140   LABSPEC 1.011 08/01/2019 1028   PHURINE 6.0 08/01/2019 1028   GLUCOSEU NEGATIVE 08/01/2019 1028   HGBUR NEGATIVE 08/01/2019 1028   BILIRUBINUR NEGATIVE 08/01/2019 1028    BILIRUBINUR Negative 07/17/2018 [place] 08/01/2019 1028   PROTEINUR NEGATIVE 08/01/2019 1028   NITRITE NEGATIVE 08/01/2019 1028   LEUKOCYTESUR MODERATE (A) 08/01/2019 1028  Radiological Exams on Admission: DG Chest Portable 1 View  Result Date: 05/20/2020 CLINICAL DATA:  69 year old female with chronic lung disease, shortness of breath. EXAM: PORTABLE CHEST 1 VIEW COMPARISON:  Chest CT 07/20/2019 and earlier. FINDINGS: Portable AP semi upright view at 1220 hours. Stable lung volumes and mediastinal contours. Coarse bilateral pulmonary opacity with chronic architectural distortion in the periphery of both lungs. Bilateral ventilation stable or improved compared to 2020 exams. No definite acute pulmonary opacity. No pneumothorax or pleural effusion. No cardiomegaly. Calcified aortic atherosclerosis. Visualized tracheal air column is within normal limits. Paucity of bowel gas in the upper abdomen. No acute osseous abnormality identified. IMPRESSION: 1. Chronic lung disease. No superimposed acute findings are identified. 2. Aortic Atherosclerosis (ICD10-I70.0) and Emphysema (ICD10-J43.9). Electronically Signed   By: Genevie Ann M.D.   On: 05/20/2020 12:37    EKG: Independently reviewed.  Sinus tachycardia  Assessment/Plan Principal Problem:   COPD with acute exacerbation (HCC) Active Problems:   Essential hypertension   Anemia in chronic renal disease   Major depression, chronic   Acute on chronic respiratory failure (HCC)   Chronic congestive heart failure with left ventricular diastolic dysfunction (HCC)   Hypokalemia   Hypomagnesemia   Chronic kidney disease, stage 3     COPD with acute exacerbation Continue scheduled and as needed bronchodilator therapy Continue systemic steroids   Acute on chronic respiratory failure Secondary to acute COPD exacerbation At baseline patient is on 3 L of oxygen but currently requires 5 L to maintain pulse oximetry greater than  92% We will attempt to wean patient back down to her baseline home O2   Hypokalemia Secondary to diuretic therapy Symptomatic as evidenced by generalized muscle weakness Supplement potassium    Hypomagnesemia Supplement magnesium   Anemia of chronic renal disease H&H is stable Continue iron supplementation   Chronic diastolic dysfunction CHF Continue carvedilol, furosemide and losartan   Hypothyroidism Continue Synthroid   Depression Continue mirtazapine     DVT prophylaxis: SCD Code Status: Full code Family Communication: Greater than 50% of time was spent discussing patient's condition and plan of care with her at the bedside.  All questions and concerns have been addressed.  She verbalizes understanding and agrees with the plan. Disposition Plan: Back to previous home environment Consults called: None    Yachet Mattson MD [facility]     05/20/2020, 3:46 PM

## 2020-05-20 NOTE — ED Notes (Signed)
Pt's O2 sats 82% on 3L [place]. Pt turned up to 6L [place] with sats of 99%. MD Quentin Cornwall notified. See orders for x2 duonebs.

## 2020-05-20 NOTE — ED Notes (Signed)
IV team at bedside 

## 2020-05-21 ENCOUNTER — Encounter: Payer: Self-pay | Admitting: Internal Medicine

## 2020-05-21 ENCOUNTER — Observation Stay: Payer: Medicare Other

## 2020-05-21 DIAGNOSIS — R7989 Other specified abnormal findings of blood chemistry: Secondary | ICD-10-CM | POA: Diagnosis present

## 2020-05-21 DIAGNOSIS — Y92239 Unspecified place in hospital as the place of occurrence of the external cause: Secondary | ICD-10-CM | POA: Diagnosis not present

## 2020-05-21 DIAGNOSIS — Z7902 Long term (current) use of antithrombotics/antiplatelets: Secondary | ICD-10-CM | POA: Diagnosis not present

## 2020-05-21 DIAGNOSIS — N183 Chronic kidney disease, stage 3 unspecified: Secondary | ICD-10-CM | POA: Diagnosis present

## 2020-05-21 DIAGNOSIS — J9621 Acute and chronic respiratory failure with hypoxia: Secondary | ICD-10-CM | POA: Diagnosis present

## 2020-05-21 DIAGNOSIS — I5032 Chronic diastolic (congestive) heart failure: Secondary | ICD-10-CM | POA: Diagnosis present

## 2020-05-21 DIAGNOSIS — Z7982 Long term (current) use of aspirin: Secondary | ICD-10-CM | POA: Diagnosis not present

## 2020-05-21 DIAGNOSIS — E785 Hyperlipidemia, unspecified: Secondary | ICD-10-CM | POA: Diagnosis present

## 2020-05-21 DIAGNOSIS — I700 Atherosclerosis of aorta: Secondary | ICD-10-CM | POA: Diagnosis not present

## 2020-05-21 DIAGNOSIS — J849 Interstitial pulmonary disease, unspecified: Secondary | ICD-10-CM | POA: Diagnosis present

## 2020-05-21 DIAGNOSIS — Z833 Family history of diabetes mellitus: Secondary | ICD-10-CM | POA: Diagnosis not present

## 2020-05-21 DIAGNOSIS — J441 Chronic obstructive pulmonary disease with (acute) exacerbation: Secondary | ICD-10-CM | POA: Diagnosis present

## 2020-05-21 DIAGNOSIS — F329 Major depressive disorder, single episode, unspecified: Secondary | ICD-10-CM | POA: Diagnosis present

## 2020-05-21 DIAGNOSIS — G4700 Insomnia, unspecified: Secondary | ICD-10-CM | POA: Diagnosis present

## 2020-05-21 DIAGNOSIS — E876 Hypokalemia: Secondary | ICD-10-CM

## 2020-05-21 DIAGNOSIS — B440 Invasive pulmonary aspergillosis: Secondary | ICD-10-CM

## 2020-05-21 DIAGNOSIS — Z8249 Family history of ischemic heart disease and other diseases of the circulatory system: Secondary | ICD-10-CM | POA: Diagnosis not present

## 2020-05-21 DIAGNOSIS — Z87891 Personal history of nicotine dependence: Secondary | ICD-10-CM | POA: Diagnosis not present

## 2020-05-21 DIAGNOSIS — R531 Weakness: Secondary | ICD-10-CM | POA: Diagnosis not present

## 2020-05-21 DIAGNOSIS — T502X5A Adverse effect of carbonic-anhydrase inhibitors, benzothiadiazides and other diuretics, initial encounter: Secondary | ICD-10-CM | POA: Diagnosis not present

## 2020-05-21 DIAGNOSIS — I10 Essential (primary) hypertension: Secondary | ICD-10-CM | POA: Diagnosis not present

## 2020-05-21 DIAGNOSIS — R0902 Hypoxemia: Secondary | ICD-10-CM | POA: Diagnosis not present

## 2020-05-21 DIAGNOSIS — E039 Hypothyroidism, unspecified: Secondary | ICD-10-CM | POA: Diagnosis present

## 2020-05-21 DIAGNOSIS — I130 Hypertensive heart and chronic kidney disease with heart failure and stage 1 through stage 4 chronic kidney disease, or unspecified chronic kidney disease: Secondary | ICD-10-CM | POA: Diagnosis present

## 2020-05-21 DIAGNOSIS — Z23 Encounter for immunization: Secondary | ICD-10-CM | POA: Diagnosis not present

## 2020-05-21 DIAGNOSIS — D631 Anemia in chronic kidney disease: Secondary | ICD-10-CM | POA: Diagnosis present

## 2020-05-21 DIAGNOSIS — I2510 Atherosclerotic heart disease of native coronary artery without angina pectoris: Secondary | ICD-10-CM | POA: Diagnosis present

## 2020-05-21 DIAGNOSIS — B449 Aspergillosis, unspecified: Secondary | ICD-10-CM | POA: Diagnosis not present

## 2020-05-21 DIAGNOSIS — J9620 Acute and chronic respiratory failure, unspecified whether with hypoxia or hypercapnia: Secondary | ICD-10-CM | POA: Diagnosis not present

## 2020-05-21 DIAGNOSIS — Z20822 Contact with and (suspected) exposure to covid-19: Secondary | ICD-10-CM | POA: Diagnosis present

## 2020-05-21 LAB — BASIC METABOLIC PANEL
Anion gap: 11 (ref 5–15)
BUN: 16 mg/dL (ref 8–23)
CO2: 27 mmol/L (ref 22–32)
Calcium: 8.4 mg/dL — ABNORMAL LOW (ref 8.9–10.3)
Chloride: 105 mmol/L (ref 98–111)
Creatinine, Ser: 1.32 mg/dL — ABNORMAL HIGH (ref 0.44–1.00)
GFR calc Af Amer: 48 mL/min — ABNORMAL LOW (ref >60)
GFR calc non Af Amer: 41 mL/min — ABNORMAL LOW (ref >60)
Glucose, Bld: 154 mg/dL — ABNORMAL HIGH (ref 70–99)
Potassium: 3.4 mmol/L — ABNORMAL LOW (ref 3.5–5.1)
Sodium: 143 mmol/L (ref 135–145)

## 2020-05-21 LAB — CBC
HCT: 24.6 % — ABNORMAL LOW (ref 36.0–46.0)
Hemoglobin: 7.5 g/dL — ABNORMAL LOW (ref 12.0–15.0)
MCH: 28.5 pg (ref 26.0–34.0)
MCHC: 30.5 g/dL (ref 30.0–36.0)
MCV: 93.5 fL (ref 80.0–100.0)
Platelets: 429 10*3/uL — ABNORMAL HIGH (ref 150–400)
RBC: 2.63 MIL/uL — ABNORMAL LOW (ref 3.87–5.11)
RDW: 14 % (ref 11.5–15.5)
WBC: 5.8 10*3/uL (ref 4.0–10.5)
nRBC: 0 % (ref 0.0–0.2)

## 2020-05-21 LAB — MAGNESIUM: Magnesium: 2.3 mg/dL (ref 1.7–2.4)

## 2020-05-21 LAB — PHOSPHORUS: Phosphorus: 2.9 mg/dL (ref 2.5–4.6)

## 2020-05-21 LAB — HIV ANTIBODY (ROUTINE TESTING W REFLEX): HIV Screen 4th Generation wRfx: NONREACTIVE

## 2020-05-21 LAB — GLUCOSE, CAPILLARY: Glucose-Capillary: 146 mg/dL — ABNORMAL HIGH (ref 70–99)

## 2020-05-21 MED ORDER — IPRATROPIUM-ALBUTEROL 0.5-2.5 (3) MG/3ML IN SOLN
3.0000 mL | Freq: Four times a day (QID) | RESPIRATORY_TRACT | Status: DC
  Administered 2020-05-21 – 2020-05-22 (×6): 3 mL via RESPIRATORY_TRACT
  Filled 2020-05-21 (×6): qty 3

## 2020-05-21 MED ORDER — IPRATROPIUM-ALBUTEROL 0.5-2.5 (3) MG/3ML IN SOLN: 3.0000 mL | RESPIRATORY_TRACT | Status: DC | PRN

## 2020-05-21 MED ORDER — IOHEXOL 350 MG/ML SOLN
75.0000 mL | Freq: Once | INTRAVENOUS | Status: AC | PRN
  Administered 2020-05-21: 75 mL via INTRAVENOUS

## 2020-05-21 MED ORDER — HEPARIN SODIUM (PORCINE) 5000 UNIT/ML IJ SOLN
5000.0000 [IU] | Freq: Three times a day (TID) | INTRAMUSCULAR | Status: DC
  Administered 2020-05-21 – 2020-05-22 (×3): 5000 [IU] via SUBCUTANEOUS
  Filled 2020-05-21 (×3): qty 1

## 2020-05-21 MED ORDER — GUAIFENESIN ER 600 MG PO TB12
1200.0000 mg | ORAL_TABLET | Freq: Two times a day (BID) | ORAL | Status: DC
  Administered 2020-05-21 – 2020-05-22 (×3): 1200 mg via ORAL
  Filled 2020-05-21 (×3): qty 2

## 2020-05-21 NOTE — Progress Notes (Signed)
PROGRESS NOTE    Melissa Knox  DHR:[id number] DOB: March 20, 1951 DOA: 05/20/2020 PCP: Ronnell Freshwater, NP   Brief Narrative:  HPI per Dr. Collier Bullock on 05/20/20 HPI: Melissa Knox is a 69 y.o. female with medical history significant for chronic respiratory failure on 3 L of oxygen, COPD, hypothyroidism, hypertension, chronic diastolic dysfunction CHF who was brought into the ER by EMS for evaluation of weakness and hypoxia.  Patient stated that she had taken her granddaughter to work and typically drives without oxygen.  After dropping her granddaughter off she arrived home but was unable to get out of her car because her legs felt heavy and she had generalized weakness.  Her husband called out to the neighbors who came out to the car but were unable to get her out and so they called EMS Patient was found to be hypoxic and on 3 L her pulse oximetry was in the low to mid 80s.  She was transported to the ER on 5 L of oxygen. She has a cough productive of clear phlegm and has shortness of breath but no change from her baseline.  She denies having any fever or chills, no chest pain, no nausea, no vomiting, no changes in her bowel habits, no dizziness or lightheadedness. Labs show sodium 141, potassium 2.5, chloride 100, bicarb 13, BUN 16, creatinine 1.37, calcium 7.5, alkaline phosphatase 96, albumin 2.5, AST 16, ALT 13, white count 11.5, hemoglobin 7.7, hematocrit 25.7, MCV 94.8, RDW 14.2, platelet count 417 Patient's COVID-19 PCR test is negative Chest x-ray reviewed by me shows chronic changes.  No obvious infiltrate Twelve-lead EKG shows sinus tachycardia    ED Course: Patient is a 69 year old female with a history of COPD and chronic respiratory failure on 3 L of oxygen, chronic diastolic dysfunction CHF on diuretic therapy who presents to the ER by EMS for evaluation of weakness and hypoxia.  Patient was noted to be hypoxic with pulse oximetry on 3 L in the mid 80s, she is  currently on 5 L of oxygen to maintain pulse oximetry greater than 92%.   Patient also noted to have significant hypokalemia with no EKG changes. She was referred to observation status for further evaluation.  **Interim History   Assessment & Plan:   Principal Problem:   COPD with acute exacerbation ([facility]) Active Problems:   Essential hypertension   Anemia in chronic renal disease   Major depression, chronic   Acute on chronic respiratory failure (HCC)   Chronic congestive heart failure with left ventricular diastolic dysfunction (HCC)   Hypokalemia   Hypomagnesemia   Chronic kidney disease, stage 3  COPD with acute exacerbation -Continue scheduled and as needed bronchodilator therapy -Continue systemic steroids; patient's WBC went from 11.5 and is now 5.8 this a.m. -See below   Acute on chronic respiratory failure with hypoxia -Secondary to acute COPD exacerbation -At baseline patient is on 3 L of oxygen but currently requires 5 L to maintain pulse oximetry greater than 92% -We will attempt to wean patient back down to her baseline home O2 -She had an ambulatory home O2 screen done and she desaturated on 6 L with ambulation -Attained a CTA of the chest to rule out PE and showed no evidence of PE -CTA of the chest did show "No evidence of pulmonary embolism. 2. Increased patchy nodular thickening in the right upper lobe, potentially infectious or inflammatory. Short-term interval follow-up chest CT or PET-CT in 3 months is recommended to exclude neoplasm. 3.  Enlarging now 10 mm mean diameter nodule in the right upper lobe, previously 7 mm. Short-term interval follow-up chest CT or PET-CT in 3 months is recommended. 4. Increasing intermediate density material within a cavity in the lingula, suspicious for fungus ball/pulmonary aspergillosis. 5. Unchanged chronic consolidation in posterior aspect of the right upper lobe, potentially from chronic infection/inflammation. 6. 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 7. Aortic Atherosclerosis (ICD10-I70.0) and Emphysema (ICD10-J43.9)." -Ambulatory home O2 screen done and she severely desaturated and desaturated to 72 to 75% with 6 L with minimal exertion requiring at least 3 to 4 minutes to recovery greater than 90% -Continue with DuoNeb scheduled every 6 hours as was 3 mL every 4 hours as needed -Continue guaifenesin 200 g p.o. twice daily next-flutter valve and incentive spirometer -Consulted pulmonary for further evaluation recommendations given her CTA findings  Aspergillosis -As above increasing intermittent density material within the cavity in the lingula suspicious for fungus ball and pulmonary aspergillosis -Pulmonary consulted for further evaluation and recommendations   Hypokalemia -Secondary to diuretic therapy -Symptomatic as evidenced by generalized muscle weakness -Her potassium on admission was 2.5 and this morning was 3.4 -Replete with potassium chloride 40 mg p.o. twice daily x2 doses -Was low so this has been repleted-continue monitor and trend and replete as necessary -Repeat CMP in a.m.  Anemia of chronic renal disease -Hemoglobin/hematocrit is on the lower side but relatively stable and went from 7.7/25.3 and is now 7.5/24.6 -Continue iron supplementation with ferrous sulfate 325 mg p.o. twice daily -Currently has an order for an FOBT as well -Continue to monitor for signs and symptoms of bleeding; currently no overt bleeding noted -Repeat CBC in a.m.  Chronic Diastolic CHF -Continue Carvedilol 6.25 mg po BID, Furosemide 20 mg po Daily and Losartan 100 mg po Daily -Strict I's and O's and daily weights -Continue to monitor for signs and symptoms of volume overload -Repeat chest x-ray in the a.m.  Hypothyroidism -Continue with levothyroxine 50 mcg p.o. daily  Depression -Continue Mirtazapine 7.5 mg p.o.  nightly  Hypomagnesemia -Improved Patient magnesium level went from 1.5 and is now 2.3 -Continue monitor and replete as necessary -Repeat magnesium level in a.m.  Thrombocytosis -Mild and patient's platelet count from 417 is now 429 -Continue monitor and trend and repeat CBC in a.m.  DVT prophylaxis: SCDs, will add heparin 5000 units subcu Code Status: FULL CODE Family Communication: No family present at bedside  Disposition Plan: Pending improvement of her ambulatory home O2 screen and evaluation by Pulmonary and clearance by them  Status is: Observation  The patient will require care spanning > 2 midnights and should be moved to inpatient because: Unsafe d/c plan, IV treatments appropriate due to intensity of illness or inability to take PO and Inpatient level of care appropriate due to severity of illness  Dispo: The patient is from: Home              Anticipated d/c is to: Home              Anticipated d/c date is: 1 day              Patient currently is not medically stable to d/c.   Consultants:   Pulmonary Dr. Bea Laura   Procedures: None  Antimicrobials:  Anti-infectives (From admission, onward)   None        Subjective: Examined at bedside states that she is feeling "100% better".  No chest pain, lightheadedness or dizziness.  Denies any shortness breath this morning but when she did ambulate however she desaturated significantly and it took her a little while to recover.  Because of her significant desaturation on ambulation a CTA of the chest was ordered as well as a pulmonary consult.  No other concerns or complaints at this time.  Objective: Vitals:   05/21/20 0335 05/21/20 0723 05/21/20 1543 05/21/20 1553  BP: (!) 108/49 (!) 121/57  (!) 107/49  Pulse: 83 82  75  Resp: 18 15  17   Temp: 97.8 F (36.6 C) 98.3 F (36.8 C)  (!) 97.5 F (36.4 C)  TempSrc: Oral Oral  Oral  SpO2: 100% 100% 92% 91%  Weight:      Height:        Intake/Output Summary  (Last 24 hours) at 05/21/2020 1724 Last data filed at 05/20/2020 1850 Gross per 24 hour  Intake 120 ml  Output --  Net 120 ml   Filed Weights   05/20/20 1059 05/20/20 1512  Weight: 63.5 kg 65.4 kg   Examination: Physical Exam:  Constitutional: WN/WD overweight African-American female currently in NAD and appears calm sitting in the chair bedside Eyes: Lids and conjunctivae normal, sclerae anicteric  ENMT: External Ears, Nose appear normal. Grossly normal hearing.  Neck: Appears normal, supple, no cervical masses, normal ROM, no appreciable thyromegaly: No JVD Respiratory: Diminished to auscultation bilaterally with coarse breath sounds, no wheezing, rales, rhonchi or crackles.  Mildly increased respiratory rate and is wearing supplemental oxygen via nasal cannula Cardiovascular: RRR, no murmurs / rubs / gallops. S1 and S2 auscultated.  Trace extremity edema.  Abdomen: Soft, non-tender, distended secondary body habitus.  Bowel sounds positive.  GU: Deferred. Musculoskeletal: No clubbing / cyanosis of digits/nails. No joint deformity upper and lower extremities. Skin: No rashes, lesions, ulcers on limited skin evaluation. No induration; Warm and dry.  Neurologic: CN 2-12 grossly intact with no focal deficits. Romberg sign and cerebellar reflexes not assessed.  Psychiatric: Normal judgment and insight. Alert and oriented x 3. Normal mood and appropriate affect.   Data Reviewed: I have personally reviewed following labs and imaging studies  CBC: Recent Labs  Lab 05/20/20 1109 05/21/20 0532  WBC 11.5* 5.8  HGB 7.7* 7.5*  HCT 25.3* 24.6*  MCV 94.8 93.5  PLT 417* 657*   Basic Metabolic Panel: Recent Labs  Lab 05/20/20 1109 05/21/20 0532  NA 141 143  K 2.5* 3.4*  CL 100 105  CO2 30 27  GLUCOSE 103* 154*  BUN 16 16  CREATININE 1.37* 1.32*  CALCIUM 7.5* 8.4*  MG 1.5* 2.3  PHOS  --  2.9   GFR: Estimated Creatinine Clearance: 35.3 mL/min (A) (by C-G formula based on SCr of  1.32 mg/dL (H)). Liver Function Tests: Recent Labs  Lab 05/20/20 1109  AST 16  ALT 13  ALKPHOS 96  BILITOT 0.6  PROT 6.5  ALBUMIN 2.5*   No results for input(s): LIPASE, AMYLASE in the last 168 hours. No results for input(s): AMMONIA in the last 168 hours. Coagulation Profile: No results for input(s): INR, PROTIME in the last 168 hours. Cardiac Enzymes: No results for input(s): CKTOTAL, CKMB, CKMBINDEX, TROPONINI in the last 168 hours. BNP (last 3 results) No results for input(s): PROBNP in the last 8760 hours. HbA1C: No results for input(s): HGBA1C in the last 72 hours. CBG: Recent Labs  Lab 05/20/20 1118 05/21/20 0726  GLUCAP 114* 146*   Lipid Profile: No results for input(s):  CHOL, HDL, LDLCALC, TRIG, CHOLHDL, LDLDIRECT in the last 72 hours. Thyroid Function Tests: No results for input(s): TSH, T4TOTAL, FREET4, T3FREE, THYROIDAB in the last 72 hours. Anemia Panel: No results for input(s): VITAMINB12, FOLATE, FERRITIN, TIBC, IRON, RETICCTPCT in the last 72 hours. Sepsis Labs: No results for input(s): PROCALCITON, LATICACIDVEN in the last 168 hours.  Recent Results (from the past 240 hour(s))  SARS Coronavirus 2 by RT PCR (hospital order, performed in [facility] hospital lab) Nasopharyngeal Nasopharyngeal Swab     Status: None   Collection Time: 05/20/20 11:19 AM   Specimen: Nasopharyngeal Swab  Result Value Ref Range Status   SARS Coronavirus 2 NEGATIVE NEGATIVE Final    Comment: (NOTE) SARS-CoV-2 target nucleic acids are NOT DETECTED.  The SARS-CoV-2 RNA is generally detectable in upper and lower respiratory specimens during the acute phase of infection. The lowest concentration of SARS-CoV-2 viral copies this assay can detect is 250 copies / mL. A negative result does not preclude SARS-CoV-2 infection and should not be used as the sole basis for treatment or other patient management decisions.  A negative result may occur with improper specimen collection /  handling, submission of specimen other than nasopharyngeal swab, presence of viral mutation(s) within the areas targeted by this assay, and inadequate number of viral copies (<250 copies / mL). A negative result must be combined with clinical observations, patient history, and epidemiological information.  Fact Sheet for Patients:   StrictlyIdeas.no  Fact Sheet for Healthcare Providers: BankingDealers.co.za  This test is not yet approved or  cleared by the [place] FDA and has been authorized for detection and/or diagnosis of SARS-CoV-2 by FDA under an Emergency Use Authorization (EUA).  This EUA will remain in effect (meaning this test can be used) for the duration of the COVID-19 declaration under Section 564(b)(1) of the Act, 21 U.S.C. section 360bbb-3(b)(1), unless the authorization is terminated or revoked sooner.  Performed at [facility], [address] [place], [place] [address]      RN Pressure Injury Documentation:     Estimated body mass index is 27.24 kg/m as calculated from the following:   Height as of this encounter: 5\' 1"  (1.549 m).   Weight as of this encounter: 65.4 kg.  Malnutrition Type:      Malnutrition Characteristics:      Nutrition Interventions:    Radiology Studies: CT ANGIO CHEST PE W OR WO CONTRAST  Result Date: 05/21/2020 CLINICAL DATA:  Weakness and hypoxia. Elevated D-dimer. History of COPD and chronic respiratory failure. EXAM: CT ANGIOGRAPHY CHEST WITH CONTRAST TECHNIQUE: Multidetector CT imaging of the chest was performed using the standard protocol during bolus administration of intravenous contrast. Multiplanar CT image reconstructions and MIPs were obtained to evaluate the vascular anatomy. CONTRAST:  6mL OMNIPAQUE IOHEXOL 350 MG/ML SOLN COMPARISON:  CT chest dated July 20, 2019. FINDINGS: Cardiovascular: Satisfactory opacification of the pulmonary arteries to the  segmental level. No evidence of pulmonary embolism. Unchanged borderline cardiomegaly. No pericardial effusion. No thoracic aortic aneurysm or dissection. Coronary, aortic arch, and branch vessel atherosclerotic vascular disease. 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 Moderate to severe stenosis of the abdominal aorta between the celiac artery and SMA due to calcified atherosclerotic plaque. Mediastinum/Nodes: No enlarged mediastinal, hilar, or axillary lymph nodes. Thyroid gland, trachea, and esophagus demonstrate no significant findings. Lungs/Pleura: Extensive centrilobular and paraseptal emphysema again noted with scattered pleuroparenchymal scarring and architectural distortion. There is increased patchy nodular  thickening in the right upper lobe (series 6, image 23). Additional larger nodule in the right upper lobe around the measuring 13 x 7 mm (series 6, image 43), previously 7 x 6 mm. Chronic consolidation in posterior aspect of the right upper currently measures 4.3 x 2.3 cm, previously 4.7 x 2.4 cm. Slightly enlarged intermediate density material within a cavity in the lingula (series 6, image 52), currently measuring 2.1 x 1.8 cm, previously 1.9 x 1.6 cm. No pleural effusion or pneumothorax. Upper Abdomen: No acute abnormality. Musculoskeletal: No chest wall abnormality. No acute or significant osseous findings. Review of the MIP images confirms the above findings. IMPRESSION: 1. No evidence of pulmonary embolism. 2. Increased patchy nodular thickening in the right upper lobe, potentially infectious or inflammatory. Short-term interval follow-up chest CT or PET-CT in 3 months is recommended to exclude neoplasm. 3. Enlarging now 10 mm mean diameter nodule in the right upper lobe, previously 7 mm. Short-term interval follow-up chest CT or PET-CT in 3 months is recommended. 4. Increasing intermediate density material within a cavity in  the lingula, suspicious for fungus ball/pulmonary aspergillosis. 5. Unchanged chronic consolidation in posterior aspect of the right upper lobe, potentially from chronic infection/inflammation. 6. 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 7. Aortic Atherosclerosis (ICD10-I70.0) and Emphysema (ICD10-J43.9). Electronically Signed   By: Titus Dubin M.D.   On: 05/21/2020 14:11   DG Chest Portable 1 View  Result Date: 05/20/2020 CLINICAL DATA:  69 year old female with chronic lung disease, shortness of breath. EXAM: PORTABLE CHEST 1 VIEW COMPARISON:  Chest CT 07/20/2019 and earlier. FINDINGS: Portable AP semi upright view at 1220 hours. Stable lung volumes and mediastinal contours. Coarse bilateral pulmonary opacity with chronic architectural distortion in the periphery of both lungs. Bilateral ventilation stable or improved compared to 2020 exams. No definite acute pulmonary opacity. No pneumothorax or pleural effusion. No cardiomegaly. Calcified aortic atherosclerosis. Visualized tracheal air column is within normal limits. Paucity of bowel gas in the upper abdomen. No acute osseous abnormality identified. IMPRESSION: 1. Chronic lung disease. No superimposed acute findings are identified. 2. Aortic Atherosclerosis (ICD10-I70.0) and Emphysema (ICD10-J43.9). Electronically Signed   By: Genevie Ann M.D.   On: 05/20/2020 12:37   Scheduled Meds: . atorvastatin  10 mg Oral q1800  . carvedilol  6.25 mg Oral BID WC  . ferrous sulfate  325 mg Oral BID WC  . furosemide  20 mg Oral Daily  . guaiFENesin  1,200 mg Oral BID  . ipratropium-albuterol  3 mL Nebulization Q6H  . levothyroxine  50 mcg Oral QAC breakfast  . losartan  100 mg Oral Daily  . mirtazapine  7.5 mg Oral QHS  . pantoprazole  40 mg Oral BID  . potassium chloride  40 mEq Oral Once  . potassium chloride  40 mEq Oral BID  . [START ON 05/22/2020] predniSONE  40 mg Oral Q  breakfast  . sucralfate  1 g Oral TID WC & HS   Continuous Infusions:   LOS: 0 days   Kerney Elbe, DO [facility] PAGER is on AMION  If 7PM-7AM, please contact night-coverage [email or web address]

## 2020-05-21 NOTE — Consult Note (Signed)
Pulmonary Critical Care  Initial Consult Note  Melissa Knox TZG:[id number] DOB: Aug 08, 1951 DOA: 05/20/2020  Referring physician: Dr. Zollie Scale  Chief Complaint: Shortness of breath  HPI: Melissa Knox is a 69 y.o. female well-known to our service from the office.  Patient has a past medical history significant for chronic interstitial lung disease she is actually been followed by [facility] pulmonology at [facility] for their interstitial lung program.  The patient had been having severe issues with her breathing shortness of breath.  She was sent for evaluation because she had been found to have a positive ANCA and elevated sed rate.  Patient had mild restrictive lung disease on the pulmonary function test.  Her saturations at that time on 4 L were about 88%.  CT scan showed groundglass opacities some areas of traction bronchiectasis and consolidative opacity in the right lower lobe at that time back in about 2019.  Scans go back all the way to September 2018 with the same abnormalities in the right lower lobe.  There was also some cavitary changes noted.  On initial evaluation there was concern for malignancy and/or invasive fungal disease so she underwent a bronchoscopy and this apparently grew Aspergillus fumigatus.  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  She was being followed by the [place] pulmonary clinic up until about September 2020 when it appears she may have actually stopped going for follow-up.  She presented this time to the hospital with increasing shortness of breath and increasing oxygen requirements.  She basically appears to be at baseline right now the hospitalist service asked [place] to see the patient because of concern of desaturations.  It should be noted that she normally uses from 3 to 6 L of oxygen and she does have a tendency to drop fairly significantly with exertion.  Review of Systems:  Constitutional:  No  weight loss, night sweats, Fevers, chills, fatigue.  HEENT:  No headaches, nasal congestion, post nasal drip,  Cardio-vascular:  No chest pain, Orthopnea, PND, swelling in lower extremities, anasarca, dizziness, palpitations  GI:  No heartburn, indigestion, abdominal pain, nausea, vomiting, diarrhea  Resp:  +shortness of breath at rest. no productive cough, No coughing up of blood.No wheezing Skin:  no rash or lesions.  Musculoskeletal:  No joint pain or swelling.   Remainder ROS performed and is unremarkable other than noted in HPI  Past Medical History:  Diagnosis Date  . Atherosclerotic heart disease 06/12/2015  . CHF (congestive heart failure) ([place])   . Chronic congestive heart failure with left ventricular diastolic dysfunction ([place]) 04/12/2018  . CKD (chronic kidney disease) stage 2, GFR 60-89 ml/min 06/12/2015  . COPD (chronic obstructive pulmonary disease) ([place])   . Full dentures   . Hypercalcemia 06/12/2015  . Hyperlipidemia 06/12/2015  . Hypertension 06/12/2015  . Hypothyroidism 06/12/2015  . Insomnia 06/12/2015  . Interstitial pneumonia ([place]) 04/12/2018  . On supplemental oxygen by nasal cannula    at HS and PRN  . Pulmonary heart disease ([place]) 06/12/2015  . Shortness of breath 06/12/2015  . Solitary pulmonary nodule 06/12/2015  . Wears glasses    Past Surgical History:  Procedure Laterality Date  . CAROTID ENDARTERECTOMY Right 08/01/2019  . ENDARTERECTOMY Right 08/01/2019   Procedure: ENDARTERECTOMY CAROTID;  Surgeon: Marty Heck, MD;  Location: [facility];  Service: Vascular;  Laterality: Right;  . FRACTURE SURGERY     right ankle  . GALLBLADDER SURGERY  2012  .  IR ARCH CERVICICEBRAL NON-SEL (MS)  07/19/2019  . MULTIPLE TOOTH EXTRACTIONS    . PATCH ANGIOPLASTY Right 08/01/2019   Procedure: Patch Angioplasty;  Surgeon: Marty Heck, MD;  Location: [facility];  Service: Vascular;  Laterality: Right;  . TUBAL LIGATION     Social History:  reports that  she quit smoking about 8 years ago. Her smoking use included cigarettes. She has never used smokeless tobacco. She reports that she does not drink alcohol and does not use drugs.  No Known Allergies  Family History  Problem Relation Age of Onset  . Cancer Mother   . Hypertension Father   . Diabetes Father   . Cancer Father     Prior to Admission medications   Medication Sig Start Date End Date Taking? Authorizing Provider  albuterol (PROVENTIL) (2.5 MG/3ML) 0.083% nebulizer solution Take 3 mLs (2.5 mg total) by nebulization every 6 (six) hours as needed for wheezing or shortness of breath. 09/27/19  Yes Scarboro, Audie Clear, NP  amLODipine (NORVASC) 10 MG tablet Take 1 tablet (10 mg total) by mouth daily. 05/06/20  Yes Ronnell Freshwater, NP  aspirin 325 MG tablet Take 1 tablet (325 mg total) by mouth daily. 10/11/19  Yes Scarboro, Audie Clear, NP  atorvastatin (LIPITOR) 10 MG tablet Take 1 tablet (10 mg total) by mouth daily at 6 PM. 09/24/19  Yes Boscia, Greer Ee, NP  budesonide-formoterol (SYMBICORT) 160-4.5 MCG/ACT inhaler Inhale 2 puffs into the lungs 2 (two) times daily. 09/27/19  Yes Scarboro, Audie Clear, NP  carvedilol (COREG) 6.25 MG tablet TAKE 1 TABLET BY MOUTH TWICE A DAY Patient taking differently: Take 6.25 mg by mouth in the morning and at bedtime.  05/16/20  Yes Scarboro, Audie Clear, NP  clopidogrel (PLAVIX) 75 MG tablet Take 1 tablet (75 mg total) by mouth daily. 03/25/20  Yes Boscia, Greer Ee, NP  docusate sodium (COLACE) 100 MG capsule Take 1 capsule (100 mg total) by mouth 2 (two) times daily as needed for mild constipation. 04/14/18  Yes Gouru, Illene Silver, MD  ferrous sulfate 325 (65 FE) MG tablet Take 1 tablet (325 mg total) by mouth 2 (two) times daily with a meal. 02/15/20  Yes Boscia, Heather E, NP  furosemide (LASIX) 20 MG tablet Take 1 tablet (20 mg total) by mouth daily. 01/11/20  Yes Ronnell Freshwater, NP  levothyroxine (SYNTHROID) 50 MCG tablet Take 1 tablet (50 mcg total) by mouth daily  before breakfast. 02/27/20  Yes Boscia, Heather E, NP  loratadine (CLARITIN) 10 MG tablet TAKE 1 TABLET BY MOUTH EVERY DAY FOR ALLERGIES AS NEEDED Patient taking differently: Take 10 mg by mouth daily as needed for allergies.  03/25/20  Yes Boscia, Greer Ee, NP  losartan (COZAAR) 100 MG tablet Take 100 mg by mouth daily.   Yes Lateef, Munsoor, MD  meclizine (ANTIVERT) 12.5 MG tablet Take 12.5 mg by mouth 3 (three) times daily as needed for dizziness.  06/02/16  Yes [provider]  mirtazapine (REMERON) 15 MG tablet TAKE 1/2 OF A TABLET (7.5 MG TOTAL) BY MOUTH AT BEDTIME. Patient taking differently: Take 7.5 mg by mouth at bedtime.  11/22/19  Yes Scarboro, Audie Clear, NP  pantoprazole (PROTONIX) 40 MG tablet Take 1 tablet (40 mg total) by mouth 2 (two) times daily. 02/04/20  Yes Boscia, Heather E, NP  sucralfate (CARAFATE) 1 g tablet Take 1 tablet (1 g total) by mouth 4 (four) times daily -  with meals and at bedtime. 03/25/20  Yes Boscia,  Greer Ee, NP   Physical Exam: Vitals:   05/21/20 0335 05/21/20 0723 05/21/20 1543 05/21/20 1553  BP: (!) 108/49 (!) 121/57  (!) 107/49  Pulse: 83 82  75  Resp: 18 15  17   Temp: 97.8 F (36.6 C) 98.3 F (36.8 C)  (!) 97.5 F (36.4 C)  TempSrc: Oral Oral  Oral  SpO2: 100% 100% 92% 91%  Weight:      Height:        Wt Readings from Last 3 Encounters:  05/20/20 65.4 kg  04/08/20 65.2 kg  03/24/20 65.6 kg    General:  Appears calm and comfortable Eyes: PERRL, normal lids, irises & conjunctiva ENT: grossly normal hearing, lips & tongue Neck: no LAD, masses or thyromegaly Cardiovascular: RRR, no m/r/g. No LE edema. Respiratory: CTA bilaterally, no w/r/r.       Normal respiratory effort. Abdomen: soft, nontender Skin: no rash or induration seen on limited exam Musculoskeletal: grossly normal tone BUE/BLE Psychiatric: grossly normal mood and affect Neurologic: grossly non-focal.          Labs on Admission:  Basic Metabolic Panel: Recent Labs   Lab 05/20/20 1109 05/21/20 0532  NA 141 143  K 2.5* 3.4*  CL 100 105  CO2 30 27  GLUCOSE 103* 154*  BUN 16 16  CREATININE 1.37* 1.32*  CALCIUM 7.5* 8.4*  MG 1.5* 2.3  PHOS  --  2.9   Liver Function Tests: Recent Labs  Lab 05/20/20 1109  AST 16  ALT 13  ALKPHOS 96  BILITOT 0.6  PROT 6.5  ALBUMIN 2.5*   No results for input(s): LIPASE, AMYLASE in the last 168 hours. No results for input(s): AMMONIA in the last 168 hours. CBC: Recent Labs  Lab 05/20/20 1109 05/21/20 0532  WBC 11.5* 5.8  HGB 7.7* 7.5*  HCT 25.3* 24.6*  MCV 94.8 93.5  PLT 417* 429*   Cardiac Enzymes: No results for input(s): CKTOTAL, CKMB, CKMBINDEX, TROPONINI in the last 168 hours.  BNP (last 3 results) No results for input(s): BNP in the last 8760 hours.  ProBNP (last 3 results) No results for input(s): PROBNP in the last 8760 hours.  CBG: Recent Labs  Lab 05/20/20 1118 05/21/20 0726  GLUCAP 114* 146*    Radiological Exams on Admission: CT ANGIO CHEST PE W OR WO CONTRAST  Result Date: 05/21/2020 CLINICAL DATA:  Weakness and hypoxia. Elevated D-dimer. History of COPD and chronic respiratory failure. EXAM: CT ANGIOGRAPHY CHEST WITH CONTRAST TECHNIQUE: Multidetector CT imaging of the chest was performed using the standard protocol during bolus administration of intravenous contrast. Multiplanar CT image reconstructions and MIPs were obtained to evaluate the vascular anatomy. CONTRAST:  61mL OMNIPAQUE IOHEXOL 350 MG/ML SOLN COMPARISON:  CT chest dated July 20, 2019. FINDINGS: Cardiovascular: Satisfactory opacification of the pulmonary arteries to the segmental level. No evidence of pulmonary embolism. Unchanged borderline cardiomegaly. No pericardial effusion. No thoracic aortic aneurysm or dissection. Coronary, aortic arch, and branch vessel atherosclerotic vascular disease. 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 Moderate  to severe stenosis of the abdominal aorta between the celiac artery and SMA due to calcified atherosclerotic plaque. Mediastinum/Nodes: No enlarged mediastinal, hilar, or axillary lymph nodes. Thyroid gland, trachea, and esophagus demonstrate no significant findings. Lungs/Pleura: Extensive centrilobular and paraseptal emphysema again noted with scattered pleuroparenchymal scarring and architectural distortion. There is increased patchy nodular thickening in the right upper lobe (series 6, image 23). Additional larger nodule in the right  upper lobe around the measuring 13 x 7 mm (series 6, image 43), previously 7 x 6 mm. Chronic consolidation in posterior aspect of the right upper currently measures 4.3 x 2.3 cm, previously 4.7 x 2.4 cm. Slightly enlarged intermediate density material within a cavity in the lingula (series 6, image 52), currently measuring 2.1 x 1.8 cm, previously 1.9 x 1.6 cm. No pleural effusion or pneumothorax. Upper Abdomen: No acute abnormality. Musculoskeletal: No chest wall abnormality. No acute or significant osseous findings. Review of the MIP images confirms the above findings. IMPRESSION: 1. No evidence of pulmonary embolism. 2. Increased patchy nodular thickening in the right upper lobe, potentially infectious or inflammatory. Short-term interval follow-up chest CT or PET-CT in 3 months is recommended to exclude neoplasm. 3. Enlarging now 10 mm mean diameter nodule in the right upper lobe, previously 7 mm. Short-term interval follow-up chest CT or PET-CT in 3 months is recommended. 4. Increasing intermediate density material within a cavity in the lingula, suspicious for fungus ball/pulmonary aspergillosis. 5. Unchanged chronic consolidation in posterior aspect of the right upper lobe, potentially from chronic infection/inflammation. 6. 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 7. Aortic Atherosclerosis  (ICD10-I70.0) and Emphysema (ICD10-J43.9). Electronically Signed   By: Titus Dubin M.D.   On: 05/21/2020 14:11   DG Chest Portable 1 View  Result Date: 05/20/2020 CLINICAL DATA:  69 year old female with chronic lung disease, shortness of breath. EXAM: PORTABLE CHEST 1 VIEW COMPARISON:  Chest CT 07/20/2019 and earlier. FINDINGS: Portable AP semi upright view at 1220 hours. Stable lung volumes and mediastinal contours. Coarse bilateral pulmonary opacity with chronic architectural distortion in the periphery of both lungs. Bilateral ventilation stable or improved compared to 2020 exams. No definite acute pulmonary opacity. No pneumothorax or pleural effusion. No cardiomegaly. Calcified aortic atherosclerosis. Visualized tracheal air column is within normal limits. Paucity of bowel gas in the upper abdomen. No acute osseous abnormality identified. IMPRESSION: 1. Chronic lung disease. No superimposed acute findings are identified. 2. Aortic Atherosclerosis (ICD10-I70.0) and Emphysema (ICD10-J43.9). Electronically Signed   By: Genevie Ann M.D.   On: 05/20/2020 12:37    EKG: Independently reviewed.  Assessment/Plan Principal Problem:   COPD with acute exacerbation (HCC) Active Problems:   Essential hypertension   Anemia in chronic renal disease   Major depression, chronic   Acute on chronic respiratory failure (HCC)   Chronic congestive heart failure with left ventricular diastolic dysfunction (HCC)   Hypokalemia   Hypomagnesemia   Chronic kidney disease, stage 3   1. Acute hypoxic respiratory failure this is not a new problem she has chronic oxygen dependence.  She also was noted to have severe desaturations in the office with exertion.  She uses oxygen anywhere from 6 L up to 8 L at home.  I would recommend doing a follow-up ambulatory pulse oximetry. 2. Invasive aspergillosis.  She had been on voriconazole and she states that she stopped the medication because she could not afford it.  Currently  I do not see the voriconazole on the patient's medication list.  In addition the patient did not follow-up with [facility] pulmonology. Would resume voriconazole. She may need assistance with the medication spoke to her RN about getting assistance. 3. CT scan is basically consistent with chronic lung disease the changes we are seeing likely reflect her being off of her medications.  Would recommend going back on the voriconazole at the prior dose that she was on.  The  changes that are found on the CT scan would be consistent with progressive and invasive aspergillosis.  There is some concern of the nodule which can be followed up as an outpatient.  Code Status: Full code  Family Communication: No family present Disposition Plan: Home  Time spent: 67  I have personally obtained a history, examined the patient, evaluated laboratory and imaging results, formulated the assessment and plan and placed orders.  The Patient requires high complexity decision making for assessment and support. Total Time Spent 34min   Elvis Boot A Genice Kimberlin, MD [facility] Pulmonary Critical Care Medicine Sleep Medicine

## 2020-05-21 NOTE — Evaluation (Signed)
Occupational Therapy Evaluation Patient Details Name: Melissa Knox MRN: [id number] DOB: 04/28/1951 Today's Date: 05/21/2020    History of Present Illness Melissa Knox is a 69 y.o. female with medical history significant for chronic respiratory failure on 3 L of oxygen, COPD, hypothyroidism, hypertension, chronic diastolic dysfunction CHF, who was brought into the ER by EMS for evaluation of weakness and hypoxia. Pt dropped her granddaughter off at school, w/o taking O2 in car. After she returned home, she was unable to get out of her car. She estimates she sat in car for ~ 2 hours before neighbors found her, reported she was "talking out of her head," could not move her into the house, and called EMS. EMS transported pt to ER on 5 L of oxygen.   Clinical Impression   [name] lives at home with her husband and 45 y.o. granddaughter, whom she has raised since birth. She reports being IND at home -- driving, taking her granddaughter to/from school each day, performing housekeeping chores, including most of the cooking. [name] uses 3L O2 via [place] at home. She has a 2-story home with bedroom and full bath upstairs; she sleeps downstairs in a recliner and sink-bathes, rather than going upstairs each day. Pt reports no falls in the previous 12 months, and no challenges with medication management. Pt states that she normally does not take her O2 with her when she does the school drop-off/pick-up, and has not had a problem with this in the past. Yesterday, however, she was unable to get out of the car when she returned home from school, and was also unable to recall how to use her mobile phone to call her husband. When seen for evaluation today, pt was on 6L O2, with pulse ox at 98% in bed, but dropping quickly with OOB activity -- down to 83% with standing and 75% with ambulation. No LOB with sitting or standing, but very slow movements and frequent breaks required. Slow, rocking movements to move  from supine-sit-stand, with OT providing CGA. Right lateral lean in sitting. Pt would benefit from skilled OT services to address these functional limitations, allowing her to return home at [facility].      Follow Up Recommendations  No OT follow up    Equipment Recommendations  None recommended by OT    Recommendations for Other Services       Precautions / Restrictions Precautions Precautions: None (low fall risk) Restrictions Weight Bearing Restrictions: No      Mobility Bed Mobility Overal bed mobility: Needs Assistance Bed Mobility: Supine to Sit     Supine to sit: Min guard     General bed mobility comments: Pt able to transition from supine<sit without hands-on assist; however, required increased time and repeated breaks.  Transfers Overall transfer level: Needs assistance Equipment used: None Transfers: Sit to/from Stand Sit to Stand: Min guard         General transfer comment: Pt able to transition from sit<stand without hands-on assist and DME; however, required increased time and rocking motion. O2 dropped from 98% to 83% when transferring sit to stand    Balance Overall balance assessment: Needs assistance Sitting-balance support: Feet supported Sitting balance-Leahy Scale: Good   Postural control: Right lateral lean   Standing balance-Leahy Scale: Good                 High Level Balance Comments: Ambulates w/o DME, however, tasks very small steps. O2 drops to 75% when walking short distance, recovers  to 98% within ~3 minutes of returning to sit           ADL either performed or assessed with clinical judgement   ADL Overall ADL's : Needs assistance/impaired Eating/Feeding: Independent   Grooming: Modified independent Grooming Details (indicate cue type and reason): O2 saturation drops quickly when pt performs grooming tasks in standing Upper Body Bathing: Modified independent   Lower Body Bathing: Min guard   Upper Body Dressing :  Modified independent   Lower Body Dressing: Min guard Lower Body Dressing Details (indicate cue type and reason): O2 drops into low 80s with activity Toilet Transfer: Min guard   Toileting- Clothing Manipulation and Hygiene: Min guard   Tub/ Shower Transfer: Nurse, learning disability Details (indicate cue type and reason): Pt states that she primarily takes "bird baths" at home Functional mobility during ADLs: Min guard General ADL Comments: Pt reports being IND in ADL, IADL at home. At present, pt is 98% on pulse oximetry on 6 L O2 [place], dropping into low 80s with OOB activity.     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     Perception     Praxis      Pertinent Vitals/Pain       Hand Dominance Right   Extremity/Trunk Assessment Upper Extremity Assessment Upper Extremity Assessment: Overall WFL for tasks assessed   Lower Extremity Assessment Lower Extremity Assessment: Overall WFL for tasks assessed       Communication Communication Communication: No difficulties   Cognition Arousal/Alertness: Awake/alert Behavior During Therapy: WFL for tasks assessed/performed                                       General Comments       Exercises Other Exercises Other Exercises: Bed mobility, grooming. Provided educ re: role of OT, POC, energy conservation, monitoring breathing   Shoulder Instructions      Home Living Family/patient expects to be discharged to:: Private residence Living Arrangements: Spouse/significant other;Children (Pt is raising her granddaughter, now 16 y.o.) Available Help at Discharge: Family;Available 24 hours/day Type of Home: House Home Access: Stairs to enter (2 small steps from garage entry; pt reports she has no trouble navigating them) Entrance Stairs-Number of Steps:  2 Entrance Stairs-Rails: None Home Layout: Two level;Bed/bath upstairs Alternate Level Stairs-Number of Steps: Flight   Bathroom Shower/Tub: Teacher, early years/pre: Standard Bathroom Accessibility: No   Home Equipment: Environmental consultant - 2 wheels;Walker - 4 wheels;Shower seat;Cane - single point;Other (comment) (Pt reports she has SPC and RW and home, but does not use them)   Additional Comments: Wears 3L O2 [place] baseline      Prior Functioning/Environment Level of Independence: Independent        Comments: Pt reports that at home she primarily washes up downstairs, using sink in half-bath. She has walk-in shower with seat upstairs, can bathe there independently, but does so infrequently, preferring not to walk up the stairs each day.        OT Problem List: Cardiopulmonary status limiting activity;Decreased activity tolerance      OT Treatment/Interventions: Self-care/ADL training;Therapeutic exercise;Energy conservation;Patient/family education;Therapeutic activities    OT Goals(Current goals can be found in the care plan section) Acute Rehab OT Goals Patient Stated Goal: to be independent  OT Goal Formulation: With patient Time For Goal Achievement: 06/04/20 ADL Goals Pt Will Perform Grooming: with modified independence (recognizing when she is able to perform tasks in standing and when she should perform in sitting) Additional ADL Goal #1: Pt is able to identify/demonstrate three strategies for energy conservation Additional ADL Goal #2: Pt is able to identify two strategies for how to respond to episodes of increased dyspnea  OT Frequency: Min 2X/week   Barriers to D/C:            Co-evaluation              AM-PAC OT "6 Clicks" Daily Activity     Outcome Measure Help from another person eating meals?: None Help from another person taking care of personal grooming?: A Little Help from another person toileting, which includes using toliet, bedpan, or urinal?:  A Little Help from another person bathing (including washing, rinsing, drying)?: A Little Help from another person to put on and taking off regular upper body clothing?: None Help from another person to put on and taking off regular lower body clothing?: A Little 6 Click Score: 20   End of Session    Activity Tolerance: Patient tolerated treatment well Patient left: in chair;with nursing/sitter in room;with call bell/phone within reach;with chair alarm set  OT Visit Diagnosis: Other abnormalities of gait and mobility (R26.89)                Time: 9937-1696 OT Time Calculation (min): 38 min Charges:  OT General Charges $OT Visit: 1 Visit OT Evaluation $OT Eval Low Complexity: 1 Low OT Treatments $Self Care/Home Management : 23-37 mins  Josiah Lobo, PhD, MS, OTR/L ascom [phone] 05/21/20, 10:35 AM

## 2020-05-21 NOTE — Progress Notes (Signed)
PT Cancellation Note  Patient Details Name: Melissa Knox MRN: [id number] DOB: March 10, 1951   Cancelled Treatment:    Reason Eval/Treat Not Completed: Patient at procedure or test/unavailable (Consult received and chart reviewed.  Patient currently preparing for breathing treatment with respiratory. Will re-attempt at later time/date as medically appropriate and available.)  Deetta Siegmann H. Owens Shark, PT, DPT, NCS 05/21/20, 10:48 AM [phone]

## 2020-05-21 NOTE — Evaluation (Signed)
Physical Therapy Evaluation Patient Details Name: Melissa Knox MRN: [id number] DOB: 22-Oct-1950 Today's Date: 05/21/2020   History of Present Illness  presented to ER secondary to weakness, hypoxia; admitted for management of acute on chronic respiratory failure due to COPD exacerbation.  Clinical Impression  Upon evaluation, patient alert and oriented; follows commands and agreeable to participation with session.  Bilat UE/LE strength and ROM grossly symmetrical and WFL; no focal weakness appreciated.  Able to complete sit/stand, basic transfers and gait (40') without assist device, cga/close sup. Demonstrates reciprocal stepping pattern with decreased step height/length, consistently reaching for walls/furniture for steadying ("just to be extra safe since I'm not at home").  Declines need/trial of RW at this time; however, could benefit for purpose of energy conservation.  Desat to 72-75% on 6L with minimal exertion, requiring 3-4 min for recovery to >90%.  MD informed/aware of performance and activity limitations. Would benefit from skilled PT to address above deficits and promote optimal return to PLOF.; Recommend transition to HHPT upon discharge from acute hospitalization.     Follow Up Recommendations Home health PT    Equipment Recommendations       Recommendations for Other Services       Precautions / Restrictions Precautions Precautions: None Restrictions Weight Bearing Restrictions: No      Mobility  Bed Mobility Overal bed mobility: Needs Assistance Bed Mobility: Supine to Sit     Supine to sit: Min guard     General bed mobility comments: seated in recliner beginning/end of treatment session  Transfers Overall transfer level: Needs assistance Equipment used: None Transfers: Sit to/from Stand Sit to Stand: Min guard;Supervision         General transfer comment: does require UE support for lift off and overall  stabilization/safety  Ambulation/Gait Ambulation/Gait assistance: Min guard;Supervision Gait Distance (Feet): 40 Feet Assistive device: None       General Gait Details: reciprocal stepping pattern with decreased step height/length, consistently reaching for walls/furniture for steadying ("just to be extra safe since I'm not at home").  Declines need/trial of RW at this time.  Stairs            Wheelchair Mobility    Modified Rankin (Stroke Patients Only)       Balance Overall balance assessment: Needs assistance Sitting-balance support: No upper extremity supported;Feet supported Sitting balance-Leahy Scale: Good   Postural control: Right lateral lean Standing balance support: No upper extremity supported Standing balance-Leahy Scale: Fair                 High Level Balance Comments: Ambulates w/o DME, however, tasks very small steps. O2 drops to 75% when walking short distance, recovers to 98% within ~3 minutes of returning to sit             Pertinent Vitals/Pain Pain Assessment: No/denies pain    Home Living Family/patient expects to be discharged to:: Private residence Living Arrangements: Spouse/significant other;Children Available Help at Discharge: Family;Available PRN/intermittently (husband works outside of the home) Type of Home: House Home Access: Stairs to enter Entrance Stairs-Rails: None Technical brewer of Steps: 2 Home Layout: Two level;Bed/bath upstairs Home Equipment: Environmental consultant - 2 wheels;Walker - 4 wheels;Shower seat;Cane - single point;Other (comment) Additional Comments: Bathroom and recliner located on main level of home; sleeps in recliner, no essential/routine needs on upper level of home    Prior Function Level of Independence: Independent         Comments: Indep with ADLs (sponge bath), household and limited community  mobilization without assist device; home O2 at 3L.  Denies fall history.     Hand Dominance    Dominant Hand: Right    Extremity/Trunk Assessment   Upper Extremity Assessment Upper Extremity Assessment: Overall WFL for tasks assessed    Lower Extremity Assessment Lower Extremity Assessment: Overall WFL for tasks assessed       Communication   Communication: No difficulties  Cognition Arousal/Alertness: Awake/alert Behavior During Therapy: WFL for tasks assessed/performed Overall Cognitive Status: Within Functional Limits for tasks assessed                                        General Comments      Exercises Other Exercises Other Exercises: Toilet transfer, ambulatory without assist device, sup; sit/stand from standard height toilet, close sup.  Desat to 72-75% on 6L with minimal exertion, requiring 3-4 min for recovery to >90%. Other Exercises: Discussed importance of activity pacing and energy conservation with functional activities; patient voiced understanding and agreement.  Indep verbalizes strategies used in home environment to manage baseline dyspnea/SOB.   Assessment/Plan    PT Assessment Patient needs continued PT services  PT Problem List Decreased activity tolerance;Decreased mobility;Cardiopulmonary status limiting activity       PT Treatment Interventions DME instruction;Gait training;Stair training;Functional mobility training;Therapeutic activities;Therapeutic exercise;Balance training;Patient/family education    PT Goals (Current goals can be found in the Care Plan section)  Acute Rehab PT Goals Patient Stated Goal: to be independent as long as I can PT Goal Formulation: With patient Time For Goal Achievement: 06/04/20 Potential to Achieve Goals: Good    Frequency Min 2X/week   Barriers to discharge        Co-evaluation               AM-PAC PT "6 Clicks" Mobility  Outcome Measure Help needed turning from your back to your side while in a flat bed without using bedrails?: None Help needed moving from lying on your  back to sitting on the side of a flat bed without using bedrails?: None Help needed moving to and from a bed to a chair (including a wheelchair)?: A Little Help needed standing up from a chair using your arms (e.g., wheelchair or bedside chair)?: A Little Help needed to walk in hospital room?: A Little Help needed climbing 3-5 steps with a railing? : A Little 6 Click Score: 20    End of Session Equipment Utilized During Treatment: Oxygen Activity Tolerance: Patient tolerated treatment well Patient left: in chair;with call bell/phone within reach;with chair alarm set Nurse Communication: Mobility status PT Visit Diagnosis: Muscle weakness (generalized) (M62.81);Difficulty in walking, not elsewhere classified (R26.2)    Time: 2706-2376 PT Time Calculation (min) (ACUTE ONLY): 17 min   Charges:   PT Evaluation $PT Eval Moderate Complexity: 1 Mod PT Treatments $Therapeutic Activity: 8-22 mins        Yousra Ivens H. Owens Shark, PT, DPT, NCS 05/21/20, 2:20 PM [phone]

## 2020-05-22 ENCOUNTER — Inpatient Hospital Stay: Payer: Medicare Other

## 2020-05-22 DIAGNOSIS — B449 Aspergillosis, unspecified: Secondary | ICD-10-CM

## 2020-05-22 LAB — CBC WITH DIFFERENTIAL/PLATELET
Abs Immature Granulocytes: 0.04 10*3/uL (ref 0.00–0.07)
Basophils Absolute: 0 10*3/uL (ref 0.0–0.1)
Basophils Relative: 0 %
Eosinophils Absolute: 0 10*3/uL (ref 0.0–0.5)
Eosinophils Relative: 0 %
HCT: 24.8 % — ABNORMAL LOW (ref 36.0–46.0)
Hemoglobin: 7.3 g/dL — ABNORMAL LOW (ref 12.0–15.0)
Immature Granulocytes: 0 %
Lymphocytes Relative: 7 %
Lymphs Abs: 0.7 10*3/uL (ref 0.7–4.0)
MCH: 28.2 pg (ref 26.0–34.0)
MCHC: 29.4 g/dL — ABNORMAL LOW (ref 30.0–36.0)
MCV: 95.8 fL (ref 80.0–100.0)
Monocytes Absolute: 0.3 10*3/uL (ref 0.1–1.0)
Monocytes Relative: 3 %
Neutro Abs: 8.6 10*3/uL — ABNORMAL HIGH (ref 1.7–7.7)
Neutrophils Relative %: 90 %
Platelets: 420 10*3/uL — ABNORMAL HIGH (ref 150–400)
RBC: 2.59 MIL/uL — ABNORMAL LOW (ref 3.87–5.11)
RDW: 14.4 % (ref 11.5–15.5)
WBC: 9.6 10*3/uL (ref 4.0–10.5)
nRBC: 0 % (ref 0.0–0.2)

## 2020-05-22 LAB — COMPREHENSIVE METABOLIC PANEL
ALT: 16 U/L (ref 0–44)
AST: 20 U/L (ref 15–41)
Albumin: 2.5 g/dL — ABNORMAL LOW (ref 3.5–5.0)
Alkaline Phosphatase: 83 U/L (ref 38–126)
Anion gap: 8 (ref 5–15)
BUN: 17 mg/dL (ref 8–23)
CO2: 28 mmol/L (ref 22–32)
Calcium: 8.7 mg/dL — ABNORMAL LOW (ref 8.9–10.3)
Chloride: 107 mmol/L (ref 98–111)
Creatinine, Ser: 1.34 mg/dL — ABNORMAL HIGH (ref 0.44–1.00)
GFR calc Af Amer: 47 mL/min — ABNORMAL LOW (ref >60)
GFR calc non Af Amer: 41 mL/min — ABNORMAL LOW (ref >60)
Glucose, Bld: 122 mg/dL — ABNORMAL HIGH (ref 70–99)
Potassium: 4 mmol/L (ref 3.5–5.1)
Sodium: 143 mmol/L (ref 135–145)
Total Bilirubin: 0.4 mg/dL (ref 0.3–1.2)
Total Protein: 6.7 g/dL (ref 6.5–8.1)

## 2020-05-22 LAB — MAGNESIUM: Magnesium: 2 mg/dL (ref 1.7–2.4)

## 2020-05-22 LAB — PHOSPHORUS: Phosphorus: 3.1 mg/dL (ref 2.5–4.6)

## 2020-05-22 MED ORDER — CLOPIDOGREL BISULFATE 75 MG PO TABS
75.0000 mg | ORAL_TABLET | Freq: Every day | ORAL | Status: DC
  Administered 2020-05-22: 75 mg via ORAL
  Filled 2020-05-22: qty 1

## 2020-05-22 MED ORDER — ASPIRIN EC 325 MG PO TBEC
325.0000 mg | DELAYED_RELEASE_TABLET | Freq: Every day | ORAL | Status: DC
  Administered 2020-05-22: 325 mg via ORAL
  Filled 2020-05-22: qty 1

## 2020-05-22 MED ORDER — VORICONAZOLE 200 MG PO TABS
200.0000 mg | ORAL_TABLET | Freq: Two times a day (BID) | ORAL | Status: DC
  Filled 2020-05-22: qty 14

## 2020-05-22 MED ORDER — ENSURE ENLIVE PO LIQD: 237.0000 mL | Freq: Two times a day (BID) | ORAL | 12 refills | Status: AC

## 2020-05-22 MED ORDER — PREDNISONE 10 MG (21) PO TBPK: ORAL_TABLET | ORAL | 0 refills | Status: AC

## 2020-05-22 MED ORDER — VORICONAZOLE 200 MG PO TABS
200.0000 mg | ORAL_TABLET | Freq: Two times a day (BID) | ORAL | Status: DC
  Administered 2020-05-22: 200 mg via ORAL
  Filled 2020-05-22 (×2): qty 1

## 2020-05-22 MED ORDER — IPRATROPIUM-ALBUTEROL 0.5-2.5 (3) MG/3ML IN SOLN: 3.0000 mL | RESPIRATORY_TRACT | 0 refills | Status: AC | PRN

## 2020-05-22 MED ORDER — VORICONAZOLE 200 MG PO TABS
200.0000 mg | ORAL_TABLET | Freq: Two times a day (BID) | ORAL | 0 refills | Status: DC
Start: 2020-05-22 — End: 2020-05-29

## 2020-05-22 MED ORDER — GUAIFENESIN ER 600 MG PO TB12: 1200.0000 mg | ORAL_TABLET | Freq: Two times a day (BID) | ORAL | 0 refills | Status: AC

## 2020-05-22 MED ORDER — ENSURE ENLIVE PO LIQD: 237.0000 mL | Freq: Two times a day (BID) | ORAL | Status: DC

## 2020-05-22 NOTE — Progress Notes (Signed)
Initial Nutrition Assessment  DOCUMENTATION CODES:   Not applicable  INTERVENTION:  Provide Ensure Enlive po BID, each supplement provides 350 kcal and 20 grams of protein. Patient prefers chocolate.  NUTRITION DIAGNOSIS:   Increased nutrient needs related to catabolic illness (COPD, CHF) as evidenced by estimated needs.  GOAL:   Patient will meet greater than or equal to 90% of their needs  MONITOR:   PO intake, Supplement acceptance, Labs, Weight trends, I & O's  REASON FOR ASSESSMENT:   Consult Assessment of nutrition requirement/status  ASSESSMENT:   69 year old female with PMHx of CKD, hypothyroidism, HLD, HTN, CHF, COPD admitted with acute exacerbation of COPD, aspergillosis.   Met with patient and her husband at bedside. She reports she has a good appetite now and at baseline. However she reports she has not been eating well in the hospital as she does not like the food here. Per chart only two meals have documented intake and one was 50% and the other 80%. She reports she is instead eating food brought in from home. She reports that she typically drinks chocolate Ensure to help meet calorie/protein needs at home. Patient reports plan is for her to discharge home today. Patient denies any further nutrition needs at this time.  Patient reports she is weight-stable and denies any unintentional weight loss. Patient is currently 65.4 kg (144.18 lbs) and weight is stable per chart.  Medications reviewed and include: ferrous sulfate 325 mg BID, Lasix 20 mg daily, Remeron 7.5 mg QHS, Protonix, prednisone 40 mg daily, Carafate 1 gram TID and QHS.  Labs reviewed: CBG 146, Creatinine 1.34.  Patient does not meet criteria for malnutrition at this time.  Discussed with RN. Patient likely to discharge today.  NUTRITION - FOCUSED PHYSICAL EXAM:    Most Recent Value  Orbital Region No depletion  Upper Arm Region No depletion  Thoracic and Lumbar Region No depletion  Buccal  Region No depletion  Temple Region Mild depletion  Clavicle Bone Region Mild depletion  Clavicle and Acromion Bone Region No depletion  Scapular Bone Region No depletion  Dorsal Hand Mild depletion  Patellar Region No depletion  Anterior Thigh Region No depletion  Posterior Calf Region Mild depletion  Edema (RD Assessment) None  Hair Reviewed  Eyes Reviewed  Mouth Reviewed  Skin Reviewed  Nails Reviewed     Diet Order:   Diet Order            Diet 2 gram sodium Room service appropriate? Yes; Fluid consistency: Thin  Diet effective now                EDUCATION NEEDS:   No education needs have been identified at this time  Skin:  Skin Assessment: Reviewed RN Assessment  Last BM:  05/20/2020 per chart  Height:   Ht Readings from Last 1 Encounters:  05/20/20 $RemoveB'5\' 1"'ySNIOPzS$  (1.549 m)   Weight:   Wt Readings from Last 1 Encounters:  05/20/20 65.4 kg   BMI:  Body mass index is 27.24 kg/m.  Estimated Nutritional Needs:   Kcal:  1700-1900  Protein:  85-95 grams  Fluid:  1.5-1.7 L/day  Jacklynn Barnacle, MS, RD, LDN Pager number available on Amion

## 2020-05-22 NOTE — Care Management Important Message (Signed)
Important Message  Patient Details  Name: Melissa Knox MRN: [id number] Date of Birth: [birth year]   Medicare Important Message Given:  Yes  Initial Medicare IM given by Patient Access Associate on 05/22/2020 at 10:52am.     Dannette Barbara 05/22/2020, 3:24 PM

## 2020-05-22 NOTE — TOC Initial Note (Signed)
Transition of Care [facility]) - Initial/Assessment Note    Patient Details  Name: Melissa Knox MRN: [id number] Date of Birth: [birth year]  Transition of Care [facility]) CM/SW Contact:    Shelbie Ammons, RN Phone Number: 05/22/2020, 2:09 PM  Clinical Narrative:      RNCM met with patient at bedside. Patient sitting up, alert talking with visitor at bedside. Patient reports to be ready to go home but understands that pharmacy is working on getting medication set up for her. Discussed with patient that it is recommended she have therapy once she goes home for which she is agreeable to. Patient is agreeable to having home health set up and reports not having a preference as to the agency. She reports that she has all necessary equipment at home. She still is able to drive herself to appts and if she can't her family does. She sees Dr. Humphrey Rolls or one of his associates and she has no problems obtaining medications.  RNCM reached out to [facility] with [facility] and he is able to accept referral.              Expected Discharge Plan: [place] Services Barriers to Discharge: Other (comment) (Pending Medication issue)   Patient Goals and CMS Choice        Expected Discharge Plan and Services Expected Discharge Plan: [place] Choice: [place] arrangements for the past 2 months: Single Family Home                           HH Arranged: RN, PT [place] Agency: [place] ([facility]) Date HH Agency Contacted: 05/22/20 Time [place]: 48 Representative spoke with at [place]: [place] Arrangements/Services Living arrangements for the past 2 months: [place] Lives with:: Spouse Patient language and need for interpreter reviewed:: Yes Do you feel safe going back to the place where you live?: Yes      Need for Family Participation in Patient Care: Yes (Comment) Care giver support system in place?: No  (comment)   Criminal Activity/Legal Involvement Pertinent to Current Situation/Hospitalization: No - Comment as needed  Activities of Daily Living Home Assistive Devices/Equipment: None ADL Screening (condition at time of admission) Patient's cognitive ability adequate to safely complete daily activities?: Yes Is the patient deaf or have difficulty hearing?: No Does the patient have difficulty seeing, even when wearing glasses/contacts?: No Does the patient have difficulty concentrating, remembering, or making decisions?: No Patient able to express need for assistance with ADLs?: Yes Does the patient have difficulty dressing or bathing?: No Independently performs ADLs?: Yes (appropriate for developmental age) Does the patient have difficulty walking or climbing stairs?: No Weakness of Legs: None Weakness of Arms/Hands: None  Permission Sought/Granted                  Emotional Assessment Appearance:: Appears stated age     Orientation: : Oriented to Self, Oriented to Place, Oriented to  Time, Oriented to Situation Alcohol / Substance Use: Not Applicable Psych Involvement: No (comment)  Admission diagnosis:  COPD with acute exacerbation ([place]) [J44.1] Acute on chronic respiratory failure with hypoxia ([place]) [J96.21] Acute on chronic respiratory failure ([place]) [J96.20] Patient Active Problem List   Diagnosis Date Noted   Hypokalemia 05/20/2020   Hypomagnesemia 05/20/2020   Chronic kidney disease, stage 3 05/20/2020   Encounter for general adult  medical examination with abnormal findings 09/08/2019   Dysuria 09/08/2019   Cerebrovascular disease 09/01/2019   Carotid artery stenosis, symptomatic, right 08/01/2019   Acute CVA (cerebrovascular accident) ([place]) 07/17/2019   Peptic ulcer disease 02/17/2019   Malnutrition of moderate degree 09/07/2018   Acute on chronic respiratory failure with hypoxia ([place]) 09/05/2018   Screening for breast cancer 07/19/2018   HCAP  (healthcare-associated pneumonia) 06/03/2018   Chronic congestive heart failure with left ventricular diastolic dysfunction ([place]) 04/12/2018   Acute on chronic respiratory failure ([place]) 04/11/2018   Obstructive chronic bronchitis without exacerbation ([place]) 02/17/2018   Major depression, chronic 02/17/2018   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   Pollen allergies 01/16/2018   CHF (congestive heart failure) ([place]) 05/28/2017   Syncope 02/06/2017   Pancreatic mass 02/06/2017   Iron deficiency anemia 05/01/2016   Anemia in chronic renal disease 04/26/2016   Insomnia 06/12/2015   CKD (chronic kidney disease) stage 2, GFR 60-89 ml/min 06/12/2015   Hypercalcemia 06/12/2015   Hypothyroidism 06/12/2015   Pulmonary heart disease ([place]) 06/12/2015   Hyperlipidemia 06/12/2015   Essential hypertension 06/12/2015   Atherosclerotic heart disease 06/12/2015   Solitary pulmonary nodule 06/12/2015   COPD with acute exacerbation ([place]) 06/12/2015   PCP:  Ronnell Freshwater, NP Pharmacy:   [id number] [place] [place] [address] [id number]: [phone]: [phone]    Social Determinants of Health (SDOH) Interventions    Readmission Risk Interventions Readmission Risk Prevention Plan 05/22/2020 08/02/2019 04/14/2018  Transportation Screening Complete Complete Complete  PCP or Specialist Appt within 3-5 Days Complete Complete Complete  HRI or Home Care Consult Complete Complete -  Social Work Consult for [place]/Counseling Complete Complete -  Palliative Care Screening Not Applicable Not Applicable -  Medication Review (RN Care Manager) Complete Complete Complete  Some recent data might be hidden

## 2020-05-22 NOTE — Progress Notes (Signed)
Pharmacy - Antimicrobial Stewardship  Voriconazole supply pending patient assistance.  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  She was provided 7-day supply of voriconazole until able to try to find additional patient assistance so able to afford.  Dr Alfredia Ferguson and Dr Chancy Milroy aware.  Doreene Eland, PharmD, BCPS.   Work Cell: [phone] 05/22/2020 5:35 PM

## 2020-05-22 NOTE — Consult Note (Addendum)
Pharmacy Antibiotic Note  Melissa Knox is a 69 y.o. 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  Pharmacy has been consulted for Voriconazole dosing. Per notes, patient was previously on Voriconazole and was lost to follow up at [facility]. Patient stopped taking Voriconazole due to affordability. Per Dr. Derrek Gu note, resume Voriconazole at the prior dose. Per chart review, patient was taking Voriconazole 200 mg BID.   Given patient's renal function is < 50 mL/min, it is recommended to start the oral formulation.   There is no recent baseline EKG to assess QTc.Although patient is not currently taking QTc prolonging medications. Baseline hepatic and electrolytes are WNL.   Patient is on atorvastatin and is metabolized via CYP3A4. Drug interaction may increase the concentration of atorvastatin. Pravastatin and rosuvastatin are less dependent on CYP 3A4. Will need to monitor for myopathy while patient is concomitantly taking Voriconazole.   Plan: Will order Voriconazole PO 200 mg BID. Will check Voriconazole trough in 3-5 days, after start of medication, to ensure dose is therapeutic.   May consider switching from atorvastatin to an equipotent dose of rosuvastatin, if appropriate.   Height: 5\' 1"  (154.9 cm) Weight: 65.4 kg (144 lb 2.9 oz) IBW/kg (Calculated) : 47.8  Temp (24hrs), Avg:98 F (36.7 C), Min:97.5 F (36.4 C), Max:98.4 F (36.9 C)  Recent Labs  Lab 05/20/20 1109 05/21/20 0532 05/22/20 0421  WBC 11.5* 5.8 9.6  CREATININE 1.37* 1.32* 1.34*    Estimated Creatinine Clearance: 34.8 mL/min (A) (by C-G formula based on SCr of 1.34 mg/dL (H)).    No Known Allergies  Antimicrobials this admission: 9/23 Voriconazole  Thank you for allowing pharmacy to be a part of this patient's care.  Rowland Lathe 05/22/2020 9:06 AM

## 2020-05-22 NOTE — Discharge Summary (Signed)
Physician Discharge Summary  Melissa Knox KGM:[id number] DOB: 1950/11/09 DOA: 05/20/2020  PCP: Melissa Freshwater, NP  Admit date: 05/20/2020 Discharge date: 05/22/2020  Admitted From: Home Disposition: Home with [facility] PT/OT/RN  Recommendations for Outpatient Follow-up:  1. Follow up with PCP in 1-2 weeks 2. Follow up with Pulmonary within 1-2 weeks; Have Pulmonary contact Manufacturer for Assistance of Voriconazole, A 7 day supply was given to the patient prior to discharge and Melissa Knox to apply for assistance in the outpatient setting for the patient 3. Please obtain CMP/CBC, Mag, Phos in one week 4. Please follow up on the following pending results:  Home Health: Yes  Equipment/Devices: None    Discharge Condition: Stable CODE STATUS: FULL CODE Diet recommendation: Heart Healthy Diet   Brief/Interim Summary: HPI per Dr. Royce Macadamia Knox on 05/20/20 UYQ:[id number] [name] a 69 y.o.femalewith medical history significant forchronic respiratory failure on 3 L of oxygen, COPD, hypothyroidism, hypertension, chronic diastolic dysfunction CHF who was brought into the ER by EMS for evaluation of weakness and hypoxia. Patient stated that she had taken her granddaughter to work and typically drives without oxygen. After dropping her granddaughter off she arrived home but was unable to get out of her car because her legs felt heavy and she had generalized weakness.Her husband called out to the neighbors who came out to the car but were unable to get her out and so they called EMS Patient was found to be hypoxic and on 3 L her pulse oximetry was in the low to mid 80s. She was transported to the ER on 5 L of oxygen. She has a cough productive of clear phlegm and has shortness of breath but no change from her baseline.She denies having any fever or chills, no chest pain, no nausea, no vomiting, no changes in her bowel habits, no dizziness or lightheadedness. Labs show sodium  141,potassium 2.5, chloride 100, bicarb 13, BUN 16, creatinine 1.37, calcium 7.5, alkaline phosphatase 96, albumin 2.5, AST 16, ALT 13, white count 11.5, hemoglobin 7.7, hematocrit 25.7, MCV 94.8, RDW 14.2, platelet count 417 Patient's COVID-19 PCR test is negative Chest x-ray reviewed by me shows chronic changes. No obvious infiltrate Twelve-lead EKG shows sinus tachycardia    ED Course:Patient is a 69 year old female with a history of COPD and chronic respiratory failure on 3 L of oxygen, chronic diastolic dysfunction CHF on diuretic therapy who presents to the ER by EMS for evaluation of weakness and hypoxia. Patient was noted to be hypoxic with pulse oximetry on 3 L in the mid 80s, she is currently on 5 L of oxygen to maintain pulse oximetry greater than 92%.  Patient also noted to have significant hypokalemia with no EKG changes. She was referred to observation status for further evaluation.  **Interim History She desaturated significantly yesterday on ambulation.  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  Unfortunately the voriconazole and the other treatment options are very cost prohibitive to the patient.  We are able to provide the patient a 7-day supply of the voriconazole and she will work with her pulmonologist to apply for assistance directly from the manufacturer in the outpatient setting.  She is stable to be discharged as she has supplemental oxygen at home that can be titrated up to 10 L.  She ambulated on 3 L and desaturated on 2 L she is stable.  She has  no other concerns or complaints at this time and her electrolytes are improved.  She will follow up with PCP and pulmonology very closely in outpatient setting  Discharge Diagnoses:  Principal Problem:   COPD with acute exacerbation ([place]) Active Problems:   Essential hypertension    Anemia in chronic renal disease   Major depression, chronic   Acute on chronic respiratory failure (HCC)   Chronic congestive heart failure with left ventricular diastolic dysfunction (HCC)   Hypokalemia   Hypomagnesemia   Chronic kidney disease, stage 3    COPD with acute exacerbation -Continue scheduled and as needed bronchodilator therapy -Continue systemic steroids; patient's WBC went from 11.5 and is now 5.8 this a.m. -See below   Acute on chronic respiratory failure with hypoxia, improving -Secondary to acute COPD exacerbation -At baseline patient is on 3 L of oxygen but currently requires 5 L to maintain pulse oximetry greater than 92% -We will attempt to wean patient back down to her baseline home O2 -She had an ambulatory home O2 screen done and she desaturated on 6 L with ambulation -Attained a CTA of the chest to rule out PE and showed no evidence of PE -CTA of the chest did show "No evidence of pulmonary embolism. 2. Increased patchy nodular thickening in the right upper lobe, potentially infectious or inflammatory. Short-term interval follow-up chest CT or PET-CT in 3 months is recommended to exclude neoplasm. 3. Enlarging now 10 mm mean diameter nodule in the right upper lobe, previously 7 mm. Short-term interval follow-up chest CT or PET-CT in 3 months is recommended. 4. Increasing intermediate density material within a cavity in the lingula, suspicious for fungus ball/pulmonary aspergillosis. 5. Unchanged chronic consolidation in posterior aspect of the right upper lobe, potentially from chronic infection/inflammation. 6. 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 7. Aortic Atherosclerosis (ICD10-I70.0) and Emphysema (ICD10-J43.9)." -Ambulatory home O2 screen done and she severely desaturated and desaturated to 72 to 75% with 6 L with minimal exertion requiring at least 3 to 4 minutes to recovery  greater than 90% -Continue with DuoNeb scheduled every 6 hours as was 3 mL every 4 hours as needed -Continue guaifenesin 200 g p.o. twice daily next-flutter valve and incentive spirometer -Consulted pulmonary for further evaluation recommendations given her CTA findings -she ambulated today and did not desaturate on 6 L so she is stable from medical perspective to be discharged and we have provided her a 7-day supply of voriconazole as she applies for assistance and contact the manufacturer given that the other options are not covered by her insurance and that this is covered by her insurance but still very cost prohibitive for the patient.  Melissa Knox will assist with the patient and obtain the voriconazole for lifelong treatment  Invasive Aspergillosis -As above increasing intermittent density material within the cavity in the lingula suspicious for fungus ball and pulmonary aspergillosis -Pulmonary consulted for further evaluation and recommendations and they recommend continue voriconazole for the patient.  Patient unfortunately was not able to afford the voriconazole and there are no other options so I contacted pharmacy and they were able to get the patient a 7-day supply from the hospital while she applies for assistance with the help of her pulmonologist in the outpatient setting as Melissa Knox feels that she needs to continue the voriconazole lifelong  Hypokalemia -Secondary to diuretic therapy -Symptomatic as evidenced by generalized muscle weakness -Her potassium on admission was 2.5 and this morning  was 4.0 -Was low so this has been repleted-continue monitor and trend and replete as necessary -Repeat CMP in a.m.  Anemia of chronic renal disease -Hemoglobin/hematocrit is on the lower side but relatively stable and went from 7.7/25.3 and is now 7.5/24.6 yesterday and today it is 7.3/24 point -Continue iron supplementation with ferrous sulfate 325 mg p.o. twice daily -Currently has an  order for an FOBT as well but has not had any bleeding -Continue to monitor for signs and symptoms of bleeding; currently no overt bleeding noted -Repeat CBC in a.m.  Chronic Diastolic CHF -Continue Carvedilol 6.25 mg po BID, Furosemide 20 mg po Daily and Losartan 100 mg po Daily -Strict I's and O's and daily weights -Continue to monitor for signs and symptoms of volume overload -Repeat chest x-ray this AM showed "Stable right upper lobe consolidation. Unchanged interstitial coarsening related to diffuse underlying interstitial lung disease."  Hypothyroidism -Continue with Levothyroxine 50 mcg p.o. daily  Depression -Continue Mirtazapine 7.5 mg p.o. nightly  Hypomagnesemia -Improved -Patient magnesium level went from 1.5 and is now 2.0 -Continue monitor and replete as necessary -Repeat magnesium level in a.m.  Thrombocytosis -Mild and patient's platelet count from 417 -> 429 -> 420 -Continue monitor and trend and repeat CBC in the outpatient setting  Discharge Instructions  Discharge Instructions    Call MD for:  difficulty breathing, headache or visual disturbances   Complete by: As directed    Call MD for:  extreme fatigue   Complete by: As directed    Call MD for:  hives   Complete by: As directed    Call MD for:  persistant dizziness or light-headedness   Complete by: As directed    Call MD for:  persistant nausea and vomiting   Complete by: As directed    Call MD for:  redness, tenderness, or signs of infection (pain, swelling, redness, odor or green/yellow discharge around incision site)   Complete by: As directed    Call MD for:  severe uncontrolled pain   Complete by: As directed    Call MD for:  temperature >100.4   Complete by: As directed    Diet - low sodium heart healthy   Complete by: As directed    Discharge instructions   Complete by: As directed    You were cared for by a hospitalist during your hospital stay. If you have any questions about  your discharge medications or the care you received while you were in the hospital after you are discharged, you can call the unit and ask to speak with the hospitalist on call if the hospitalist that took care of you is not available. Once you are discharged, your primary care physician will handle any further medical issues. Please note that NO REFILLS for any discharge medications will be authorized once you are discharged, as it is imperative that you return to your primary care physician (or establish a relationship with a primary care physician if you do not have one) for your aftercare needs so that they can reassess your need for medications and monitor your lab values.  Follow up with PCP and Pulmonary. Take all medications as prescribed. Have Pulmonary Office fill out paperwork to try to get medication assistance for Voriconazole. If symptoms change or worsen please return to the ED for evaluation   Increase activity slowly   Complete by: As directed      Allergies as of 05/22/2020   No Known Allergies     Medication  List    TAKE these medications   albuterol (2.5 MG/3ML) 0.083% nebulizer solution Commonly known as: PROVENTIL Take 3 mLs (2.5 mg total) by nebulization every 6 (six) hours as needed for wheezing or shortness of breath. Notes to patient: MAY TAKE ANY TIME AS NEEDED    amLODipine 10 MG tablet Commonly known as: NORVASC Take 1 tablet (10 mg total) by mouth daily. Notes to patient: NOT GIVEN DURING HOSPITAL STAY    aspirin 325 MG tablet Take 1 tablet (325 mg total) by mouth daily.   atorvastatin 10 MG tablet Commonly known as: LIPITOR Take 1 tablet (10 mg total) by mouth daily at 6 PM.   budesonide-formoterol 160-4.5 MCG/ACT inhaler Commonly known as: SYMBICORT Inhale 2 puffs into the lungs 2 (two) times daily.   carvedilol 6.25 MG tablet Commonly known as: COREG TAKE 1 TABLET BY MOUTH TWICE A DAY What changed: when to take this   clopidogrel 75 MG  tablet Commonly known as: PLAVIX Take 1 tablet (75 mg total) by mouth daily.   docusate sodium 100 MG capsule Commonly known as: COLACE Take 1 capsule (100 mg total) by mouth 2 (two) times daily as needed for mild constipation.   feeding supplement (ENSURE ENLIVE) Liqd Take 237 mLs by mouth 2 (two) times daily between meals. Start taking on: May 23, 2020   ferrous sulfate 325 (65 FE) MG tablet Take 1 tablet (325 mg total) by mouth 2 (two) times daily with a meal.   furosemide 20 MG tablet Commonly known as: LASIX Take 1 tablet (20 mg total) by mouth daily.   guaiFENesin 600 MG 12 hr tablet Commonly known as: MUCINEX Take 2 tablets (1,200 mg total) by mouth 2 (two) times daily for 5 days.   ipratropium-albuterol 0.5-2.5 (3) MG/3ML Soln Commonly known as: DUONEB Take 3 mLs by nebulization every 4 (four) hours as needed.   levothyroxine 50 MCG tablet Commonly known as: SYNTHROID Take 1 tablet (50 mcg total) by mouth daily before breakfast.   loratadine 10 MG tablet Commonly known as: CLARITIN TAKE 1 TABLET BY MOUTH EVERY DAY FOR ALLERGIES AS NEEDED What changed:   how much to take  how to take this  when to take this  reasons to take this  additional instructions   losartan 100 MG tablet Commonly known as: COZAAR Take 100 mg by mouth daily.   meclizine 12.5 MG tablet Commonly known as: ANTIVERT Take 12.5 mg by mouth 3 (three) times daily as needed for dizziness.   mirtazapine 15 MG tablet Commonly known as: REMERON TAKE 1/2 OF A TABLET (7.5 MG TOTAL) BY MOUTH AT BEDTIME. What changed:   how much to take  how to take this  when to take this  additional instructions   pantoprazole 40 MG tablet Commonly known as: Protonix Take 1 tablet (40 mg total) by mouth 2 (two) times daily.   predniSONE 10 MG (21) Tbpk tablet Commonly known as: STERAPRED UNI-PAK 21 TAB Take 6 pills on day 1, 5 pills on day 2, 4 pills on day 3, 3 pills on day 4, 2 pills on  day 5, 1 pill on day 6 and then stop on day 7   sucralfate 1 g tablet Commonly known as: CARAFATE Take 1 tablet (1 g total) by mouth 4 (four) times daily -  with meals and at bedtime.   voriconazole 200 MG tablet Commonly known as: VFEND Take 1 tablet (200 mg total) by mouth 2 (two) times daily.  Follow-up Information    Call Melissa Freshwater, NP.   Specialty: Family Medicine Why: Follow up within 1 week; Per Office she iis going to see Melissa Knox for [facility] F/U Appt.  Contact information: [place] [address] [phone]        Allyne Gee, MD. Call on 06/05/2020.   Specialties: Internal Medicine, Pulmonary Disease Why: Follow up within 1-2 weeks;  @ 10:30 am Contact information: [place]  [address] [phone]              No Known Allergies  Consultations:  Pulmonary  Procedures/Studies: DG Chest 1 View  Result Date: 05/22/2020 CLINICAL DATA:  Dyspnea EXAM: CHEST  1 VIEW COMPARISON:  05/20/2020 FINDINGS: The lungs are symmetrically expanded. Pulmonary insufflation is stable when compared to prior examination. Interstitial thickening and coarsening is again seen in keeping with the extensive underlying cystic lung disease noted on CT examination of 05/21/2020. Superimposed consolidation within the right upper lobe appears stable since prior examination. No pneumothorax or pleural effusion. Cardiac size within normal limits. No acute bone abnormality. IMPRESSION: Stable right upper lobe consolidation. Unchanged interstitial coarsening related to diffuse underlying interstitial lung disease. Electronically Signed   By: Fidela Salisbury MD   On: 05/22/2020 04:18   CT ANGIO CHEST PE W OR WO CONTRAST  Result Date: 05/21/2020 CLINICAL DATA:  Weakness and hypoxia. Elevated D-dimer. History of COPD and chronic respiratory failure. EXAM: CT ANGIOGRAPHY CHEST WITH CONTRAST TECHNIQUE: Multidetector CT imaging of the chest was performed  using the standard protocol during bolus administration of intravenous contrast. Multiplanar CT image reconstructions and MIPs were obtained to evaluate the vascular anatomy. CONTRAST:  69mL OMNIPAQUE IOHEXOL 350 MG/ML SOLN COMPARISON:  CT chest dated July 20, 2019. FINDINGS: Cardiovascular: Satisfactory opacification of the pulmonary arteries to the segmental level. No evidence of pulmonary embolism. Unchanged borderline cardiomegaly. No pericardial effusion. No thoracic aortic aneurysm or dissection. Coronary, aortic arch, and branch vessel atherosclerotic vascular disease. 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 Moderate to severe stenosis of the abdominal aorta between the celiac artery and SMA due to calcified atherosclerotic plaque. Mediastinum/Nodes: No enlarged mediastinal, hilar, or axillary lymph nodes. Thyroid gland, trachea, and esophagus demonstrate no significant findings. Lungs/Pleura: Extensive centrilobular and paraseptal emphysema again noted with scattered pleuroparenchymal scarring and architectural distortion. There is increased patchy nodular thickening in the right upper lobe (series 6, image 23). Additional larger nodule in the right upper lobe around the measuring 13 x 7 mm (series 6, image 43), previously 7 x 6 mm. Chronic consolidation in posterior aspect of the right upper currently measures 4.3 x 2.3 cm, previously 4.7 x 2.4 cm. Slightly enlarged intermediate density material within a cavity in the lingula (series 6, image 52), currently measuring 2.1 x 1.8 cm, previously 1.9 x 1.6 cm. No pleural effusion or pneumothorax. Upper Abdomen: No acute abnormality. Musculoskeletal: No chest wall abnormality. No acute or significant osseous findings. Review of the MIP images confirms the above findings. IMPRESSION: 1. No evidence of pulmonary embolism. 2. Increased patchy nodular thickening in the right upper lobe,  potentially infectious or inflammatory. Short-term interval follow-up chest CT or PET-CT in 3 months is recommended to exclude neoplasm. 3. Enlarging now 10 mm mean diameter nodule in the right upper lobe, previously 7 mm. Short-term interval follow-up chest CT or PET-CT in 3 months is recommended. 4. Increasing intermediate density material within a cavity in the lingula, suspicious  for fungus ball/pulmonary aspergillosis. 5. Unchanged chronic consolidation in posterior aspect of the right upper lobe, potentially from chronic infection/inflammation. 6. 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 7. Aortic Atherosclerosis (ICD10-I70.0) and Emphysema (ICD10-J43.9). Electronically Signed   By: Titus Dubin M.D.   On: 05/21/2020 14:11   DG Chest Portable 1 View  Result Date: 05/20/2020 CLINICAL DATA:  69 year old female with chronic lung disease, shortness of breath. EXAM: PORTABLE CHEST 1 VIEW COMPARISON:  Chest CT 07/20/2019 and earlier. FINDINGS: Portable AP semi upright view at 1220 hours. Stable lung volumes and mediastinal contours. Coarse bilateral pulmonary opacity with chronic architectural distortion in the periphery of both lungs. Bilateral ventilation stable or improved compared to 2020 exams. No definite acute pulmonary opacity. No pneumothorax or pleural effusion. No cardiomegaly. Calcified aortic atherosclerosis. Visualized tracheal air column is within normal limits. Paucity of bowel gas in the upper abdomen. No acute osseous abnormality identified. IMPRESSION: 1. Chronic lung disease. No superimposed acute findings are identified. 2. Aortic Atherosclerosis (ICD10-I70.0) and Emphysema (ICD10-J43.9). Electronically Signed   By: Genevie Ann M.D.   On: 05/20/2020 12:37    Subjective: Seen and examined at bedside and she was extremely anxious to be discharged.  Pulmonary recommended voriconazole however is very cost  prohibitive so we were able to get the patient a week supply and she will follow-up with her pulmonologist and PCP and plan for direct assistance on the manufacturer in the outpatient setting.  She is stable to be discharged at this time now that she has a 7-day supply and she will follow-up with PCP and pulmonology in the outpatient setting given that she is relatively stable.  She did not desaturate on 6 L of ambulation but has oxygen that can be titrated up to 10 L at home.  She is understandable agreeable with the plan of care and stable to be discharged at this time.  Discharge Exam: Vitals:   05/22/20 0746 05/22/20 1600  BP: (!) 119/49 (!) 110/49  Pulse: 74 78  Resp: 17 18  Temp: 98.4 F (36.9 C) 98.7 F (37.1 C)  SpO2: 95% 94%   Vitals:   05/21/20 2331 05/22/20 0202 05/22/20 0746 05/22/20 1600  BP: (!) 109/47  (!) 119/49 (!) 110/49  Pulse: 72  74 78  Resp: 18  17 18   Temp: 98.2 F (36.8 C)  98.4 F (36.9 C) 98.7 F (37.1 C)  TempSrc: Oral  Oral Oral  SpO2: 97% 97% 95% 94%  Weight:      Height:       General: Pt is alert, awake, not in acute distress Cardiovascular: RRR, S1/S2 +, no rubs, no gallops Respiratory: Diminished bilaterally, no wheezing, no rhonchi; wearing supplemental oxygen via nasal cannula Abdominal: Soft, NT, mildly distended secondary body habitus, bowel sounds + Extremities: Trace edema, no cyanosis  The results of significant diagnostics from this hospitalization (including imaging, microbiology, ancillary and laboratory) are listed below for reference.    Microbiology: Recent Results (from the past 240 hour(s))  SARS Coronavirus 2 by RT PCR (hospital order, performed in [facility] hospital lab) Nasopharyngeal Nasopharyngeal Swab     Status: None   Collection Time: 05/20/20 11:19 AM   Specimen: Nasopharyngeal Swab  Result Value Ref Range Status   SARS Coronavirus 2 NEGATIVE NEGATIVE Final    Comment: (NOTE) SARS-CoV-2 target nucleic acids are NOT  DETECTED.  The SARS-CoV-2 RNA is generally detectable in upper and lower respiratory specimens  during the acute phase of infection. The lowest concentration of SARS-CoV-2 viral copies this assay can detect is 250 copies / mL. A negative result does not preclude SARS-CoV-2 infection and should not be used as the sole basis for treatment or other patient management decisions.  A negative result may occur with improper specimen collection / handling, submission of specimen other than nasopharyngeal swab, presence of viral mutation(s) within the areas targeted by this assay, and inadequate number of viral copies (<250 copies / mL). A negative result must be combined with clinical observations, patient history, and epidemiological information.  Fact Sheet for Patients:   StrictlyIdeas.no  Fact Sheet for Healthcare Providers: BankingDealers.co.za  This test is not yet approved or  cleared by the [place] FDA and has been authorized for detection and/or diagnosis of SARS-CoV-2 by FDA under an Emergency Use Authorization (EUA).  This EUA will remain in effect (meaning this test can be used) for the duration of the COVID-19 declaration under Section 564(b)(1) of the Act, 21 U.S.C. section 360bbb-3(b)(1), unless the authorization is terminated or revoked sooner.  Performed at [facility], [address] [place], [place] [address]     Labs: BNP (last 3 results) No results for input(s): BNP in the last 8760 hours. Basic Metabolic Panel: Recent Labs  Lab 05/20/20 1109 05/21/20 0532 05/22/20 0421  NA 141 143 143  K 2.5* 3.4* 4.0  CL 100 105 107  CO2 30 27 28   GLUCOSE 103* 154* 122*  BUN 16 16 17   CREATININE 1.37* 1.32* 1.34*  CALCIUM 7.5* 8.4* 8.7*  MG 1.5* 2.3 2.0  PHOS  --  2.9 3.1   Liver Function Tests: Recent Labs  Lab 05/20/20 1109 05/22/20 0421  AST 16 20  ALT 13 16  ALKPHOS 96 83  BILITOT 0.6 0.4   PROT 6.5 6.7  ALBUMIN 2.5* 2.5*   No results for input(s): LIPASE, AMYLASE in the last 168 hours. No results for input(s): AMMONIA in the last 168 hours. CBC: Recent Labs  Lab 05/20/20 1109 05/21/20 0532 05/22/20 0421  WBC 11.5* 5.8 9.6  NEUTROABS  --   --  8.6*  HGB 7.7* 7.5* 7.3*  HCT 25.3* 24.6* 24.8*  MCV 94.8 93.5 95.8  PLT 417* 429* 420*   Cardiac Enzymes: No results for input(s): CKTOTAL, CKMB, CKMBINDEX, TROPONINI in the last 168 hours. BNP: Invalid input(s): POCBNP CBG: Recent Labs  Lab 05/20/20 1118 05/21/20 0726  GLUCAP 114* 146*   D-Dimer No results for input(s): DDIMER in the last 72 hours. Hgb A1c No results for input(s): HGBA1C in the last 72 hours. Lipid Profile No results for input(s): CHOL, HDL, LDLCALC, TRIG, CHOLHDL, LDLDIRECT in the last 72 hours. Thyroid function studies No results for input(s): TSH, T4TOTAL, T3FREE, THYROIDAB in the last 72 hours.  Invalid input(s): FREET3 Anemia work up No results for input(s): VITAMINB12, FOLATE, FERRITIN, TIBC, IRON, RETICCTPCT in the last 72 hours. Urinalysis    Component Value Date/Time   COLORURINE YELLOW 08/01/2019 1028   APPEARANCEUR CLEAR 08/01/2019 1028   APPEARANCEUR Clear 07/17/2018 1140   LABSPEC 1.011 08/01/2019 1028   PHURINE 6.0 08/01/2019 1028   GLUCOSEU NEGATIVE 08/01/2019 1028   HGBUR NEGATIVE 08/01/2019 1028   BILIRUBINUR NEGATIVE 08/01/2019 1028   BILIRUBINUR Negative 07/17/2018 [place] 08/01/2019 1028   PROTEINUR NEGATIVE 08/01/2019 1028   NITRITE NEGATIVE 08/01/2019 1028   LEUKOCYTESUR MODERATE (A) 08/01/2019 1028   Sepsis Labs Invalid input(s): PROCALCITONIN,  WBC,  LACTICIDVEN  Microbiology Recent Results (from the past 240 hour(s))  SARS Coronavirus 2 by RT PCR (hospital order, performed in [facility] hospital lab) Nasopharyngeal Nasopharyngeal Swab     Status: None   Collection Time: 05/20/20 11:19 AM   Specimen: Nasopharyngeal Swab  Result Value Ref  Range Status   SARS Coronavirus 2 NEGATIVE NEGATIVE Final    Comment: (NOTE) SARS-CoV-2 target nucleic acids are NOT DETECTED.  The SARS-CoV-2 RNA is generally detectable in upper and lower respiratory specimens during the acute phase of infection. The lowest concentration of SARS-CoV-2 viral copies this assay can detect is 250 copies / mL. A negative result does not preclude SARS-CoV-2 infection and should not be used as the sole basis for treatment or other patient management decisions.  A negative result may occur with improper specimen collection / handling, submission of specimen other than nasopharyngeal swab, presence of viral mutation(s) within the areas targeted by this assay, and inadequate number of viral copies (<250 copies / mL). A negative result must be combined with clinical observations, patient history, and epidemiological information.  Fact Sheet for Patients:   StrictlyIdeas.no  Fact Sheet for Healthcare Providers: BankingDealers.co.za  This test is not yet approved or  cleared by the [place] FDA and has been authorized for detection and/or diagnosis of SARS-CoV-2 by FDA under an Emergency Use Authorization (EUA).  This EUA will remain in effect (meaning this test can be used) for the duration of the COVID-19 declaration under Section 564(b)(1) of the Act, 21 U.S.C. section 360bbb-3(b)(1), unless the authorization is terminated or revoked sooner.  Performed at [facility], [address] [place], [place] [address]    Time coordinating discharge: 35 minutes  SIGNED:  Kerney Elbe, DO [facility] 05/22/2020, 7:00 PM Pager is on [place]  If 7PM-7AM, please contact night-coverage [email or web address]

## 2020-05-22 NOTE — Progress Notes (Signed)
Patient up to ambulate, o2 to 82% on 3L , titrated to 6L and she was able to ambulate around room and remained at 95% via nasal cannula

## 2020-05-23 DIAGNOSIS — D631 Anemia in chronic kidney disease: Secondary | ICD-10-CM | POA: Diagnosis not present

## 2020-05-23 DIAGNOSIS — G4700 Insomnia, unspecified: Secondary | ICD-10-CM | POA: Diagnosis not present

## 2020-05-23 DIAGNOSIS — J449 Chronic obstructive pulmonary disease, unspecified: Secondary | ICD-10-CM | POA: Diagnosis not present

## 2020-05-23 DIAGNOSIS — I5032 Chronic diastolic (congestive) heart failure: Secondary | ICD-10-CM | POA: Diagnosis not present

## 2020-05-23 DIAGNOSIS — E039 Hypothyroidism, unspecified: Secondary | ICD-10-CM | POA: Diagnosis not present

## 2020-05-23 DIAGNOSIS — E785 Hyperlipidemia, unspecified: Secondary | ICD-10-CM | POA: Diagnosis not present

## 2020-05-23 DIAGNOSIS — Z9981 Dependence on supplemental oxygen: Secondary | ICD-10-CM | POA: Diagnosis not present

## 2020-05-23 DIAGNOSIS — B440 Invasive pulmonary aspergillosis: Secondary | ICD-10-CM | POA: Diagnosis not present

## 2020-05-23 DIAGNOSIS — E876 Hypokalemia: Secondary | ICD-10-CM | POA: Diagnosis not present

## 2020-05-23 DIAGNOSIS — D473 Essential (hemorrhagic) thrombocythemia: Secondary | ICD-10-CM | POA: Diagnosis not present

## 2020-05-23 DIAGNOSIS — I130 Hypertensive heart and chronic kidney disease with heart failure and stage 1 through stage 4 chronic kidney disease, or unspecified chronic kidney disease: Secondary | ICD-10-CM | POA: Diagnosis not present

## 2020-05-23 DIAGNOSIS — J441 Chronic obstructive pulmonary disease with (acute) exacerbation: Secondary | ICD-10-CM | POA: Diagnosis not present

## 2020-05-23 DIAGNOSIS — Z8701 Personal history of pneumonia (recurrent): Secondary | ICD-10-CM | POA: Diagnosis not present

## 2020-05-23 DIAGNOSIS — I2510 Atherosclerotic heart disease of native coronary artery without angina pectoris: Secondary | ICD-10-CM | POA: Diagnosis not present

## 2020-05-23 DIAGNOSIS — J440 Chronic obstructive pulmonary disease with acute lower respiratory infection: Secondary | ICD-10-CM | POA: Diagnosis not present

## 2020-05-23 DIAGNOSIS — J849 Interstitial pulmonary disease, unspecified: Secondary | ICD-10-CM | POA: Diagnosis not present

## 2020-05-23 DIAGNOSIS — F339 Major depressive disorder, recurrent, unspecified: Secondary | ICD-10-CM | POA: Diagnosis not present

## 2020-05-23 DIAGNOSIS — I279 Pulmonary heart disease, unspecified: Secondary | ICD-10-CM | POA: Diagnosis not present

## 2020-05-23 DIAGNOSIS — R911 Solitary pulmonary nodule: Secondary | ICD-10-CM | POA: Diagnosis not present

## 2020-05-23 DIAGNOSIS — N183 Chronic kidney disease, stage 3 unspecified: Secondary | ICD-10-CM | POA: Diagnosis not present

## 2020-05-23 DIAGNOSIS — Z7902 Long term (current) use of antithrombotics/antiplatelets: Secondary | ICD-10-CM | POA: Diagnosis not present

## 2020-05-23 DIAGNOSIS — Z7951 Long term (current) use of inhaled steroids: Secondary | ICD-10-CM | POA: Diagnosis not present

## 2020-05-23 DIAGNOSIS — Z87891 Personal history of nicotine dependence: Secondary | ICD-10-CM | POA: Diagnosis not present

## 2020-05-23 DIAGNOSIS — J9621 Acute and chronic respiratory failure with hypoxia: Secondary | ICD-10-CM | POA: Diagnosis not present

## 2020-05-26 ENCOUNTER — Encounter: Payer: Self-pay | Admitting: *Deleted

## 2020-05-26 ENCOUNTER — Other Ambulatory Visit: Payer: Self-pay | Admitting: *Deleted

## 2020-05-26 ENCOUNTER — Telehealth: Payer: Self-pay

## 2020-05-26 NOTE — Telephone Encounter (Signed)
Gave verbal order to [phone]) for skilled nursing 1 week 1,2 week 1 and 1 week 6 and 3 prn home health order given from hospital

## 2020-05-26 NOTE — Patient Outreach (Signed)
[facility]) Care Management  05/26/2020  Melissa Knox 03-Jan-1951 [id number]  Telephone outreach for [employer] [facility] [place] discharge call. 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  Talked with Melissa Knox. She verifies that she has a post hospital visit with her pulmonologist for 10/7. She has called her primary care office and they suggested this is appropriate and to keep her primary appt for 07/10/20.  She has all her meds, however she has a new med that she cannot afford. The hospital gave her a one week supply and she will need more.  Patient was recently discharged from hospital and all medications have been reviewed. Outpatient Encounter Medications as of 05/26/2020  Medication Sig Note  . albuterol (PROVENTIL) (2.5 MG/3ML) 0.083% nebulizer solution Take 3 mLs (2.5 mg total) by nebulization every 6 (six) hours as needed for wheezing or shortness of breath.   Marland Kitchen amLODipine (NORVASC) 10 MG tablet Take 1 tablet (10 mg total) by mouth daily.   Marland Kitchen aspirin 325 MG tablet Take 1 tablet (325 mg total) by mouth daily.   Marland Kitchen atorvastatin (LIPITOR) 10 MG tablet Take 1 tablet (10 mg total) by mouth daily at 6 PM.   . budesonide-formoterol (SYMBICORT) 160-4.5 MCG/ACT inhaler Inhale 2 puffs into the lungs 2 (two) times daily.   . carvedilol (COREG) 6.25 MG tablet TAKE 1 TABLET BY MOUTH TWICE A DAY (Patient taking differently: Take 6.25 mg by mouth in the morning and at bedtime. )   . clopidogrel (PLAVIX) 75 MG tablet Take 1 tablet (75 mg total) by mouth daily.   Marland Kitchen docusate sodium (COLACE) 100 MG capsule Take 1 capsule (100 mg total) by mouth 2 (two) times daily as needed for mild constipation. 05/26/2020: Only taking as needed.  . feeding supplement, ENSURE ENLIVE, (ENSURE ENLIVE) LIQD Take 237 mLs by mouth 2 (two) times daily between meals.   . ferrous sulfate 325 (65 FE) MG tablet Take 1 tablet (325 mg total) by mouth 2 (two) times daily with a meal.   .  furosemide (LASIX) 20 MG tablet Take 1 tablet (20 mg total) by mouth daily.   Marland Kitchen guaiFENesin (MUCINEX) 600 MG 12 hr tablet Take 2 tablets (1,200 mg total) by mouth 2 (two) times daily for 5 days.   Marland Kitchen ipratropium-albuterol (DUONEB) 0.5-2.5 (3) MG/3ML SOLN Take 3 mLs by nebulization every 4 (four) hours as needed.   Marland Kitchen levothyroxine (SYNTHROID) 50 MCG tablet Take 1 tablet (50 mcg total) by mouth daily before breakfast.   . loratadine (CLARITIN) 10 MG tablet TAKE 1 TABLET BY MOUTH EVERY DAY FOR ALLERGIES AS NEEDED (Patient taking differently: Take 10 mg by mouth daily as needed for allergies. )   . losartan (COZAAR) 100 MG tablet Take 100 mg by mouth daily. 07/17/2019: .  . mirtazapine (REMERON) 15 MG tablet TAKE 1/2 OF A TABLET (7.5 MG TOTAL) BY MOUTH AT BEDTIME. (Patient taking differently: Take 7.5 mg by mouth at bedtime. )   . pantoprazole (PROTONIX) 40 MG tablet Take 1 tablet (40 mg total) by mouth 2 (two) times daily.   . predniSONE (STERAPRED UNI-PAK 21 TAB) 10 MG (21) TBPK tablet Take 6 pills on day 1, 5 pills on day 2, 4 pills on day 3, 3 pills on day 4, 2 pills on day 5, 1 pill on day 6 and then stop on day 7   . sucralfate (CARAFATE) 1 g tablet Take 1 tablet (1 g total) by mouth 4 (four) times daily -  with meals and at bedtime.   . voriconazole (VFEND) 200 MG tablet Take 1 tablet (200 mg total) by mouth 2 (two) times daily.   . [DISCONTINUED] meclizine (ANTIVERT) 12.5 MG tablet Take 12.5 mg by mouth 3 (three) times daily as needed for dizziness.  07/17/2019: .   No facility-administered encounter medications on file as of 05/26/2020.   Fall Risk  04/08/2020 12/04/2019 09/04/2019 08/23/2019 02/15/2019  Falls in the past year? 0 0 0 0 0  Comment - - - - -  Number falls in past yr: - - - - -  Comment - - - - -  Injury with Fall? - - - - -  Risk for fall due to : - - - - -  Risk for fall due to: Comment - - - - -  Follow up - - - - -   Depression screen [facility] 2/9 05/26/2020 04/08/2020 12/04/2019  09/04/2019 08/23/2019  Decreased Interest 0 0 0 0 0  Down, Depressed, Hopeless 0 0 0 0 0  PHQ - 2 Score 0 0 0 0 0   Pt agrees to participate in care management. Program. Completed transition of care call.  Sent Dr. Humphrey Rolls a message to request their office apply for pharmacy assistance for medication if needed for an extended period of time.  Discussed need for advanced directives. Pt agrees to complete.  Will will talk weekly over the next month.  [name]. [name], MSN, [facility] Gerontological Nurse Practitioner [facility] Care Management [phone]

## 2020-05-28 ENCOUNTER — Telehealth: Payer: Self-pay

## 2020-05-28 DIAGNOSIS — J441 Chronic obstructive pulmonary disease with (acute) exacerbation: Secondary | ICD-10-CM | POA: Diagnosis not present

## 2020-05-28 DIAGNOSIS — J849 Interstitial pulmonary disease, unspecified: Secondary | ICD-10-CM | POA: Diagnosis not present

## 2020-05-28 DIAGNOSIS — I279 Pulmonary heart disease, unspecified: Secondary | ICD-10-CM | POA: Diagnosis not present

## 2020-05-28 DIAGNOSIS — J440 Chronic obstructive pulmonary disease with acute lower respiratory infection: Secondary | ICD-10-CM | POA: Diagnosis not present

## 2020-05-28 DIAGNOSIS — B440 Invasive pulmonary aspergillosis: Secondary | ICD-10-CM | POA: Diagnosis not present

## 2020-05-28 DIAGNOSIS — J9621 Acute and chronic respiratory failure with hypoxia: Secondary | ICD-10-CM | POA: Diagnosis not present

## 2020-05-28 NOTE — Telephone Encounter (Signed)
Spoke with jeff from [facility] he said pt resting O2 97% with 3 litre

## 2020-05-28 NOTE — Telephone Encounter (Signed)
[phone]) from physical therapy called that pt O2 80% when she was walking as per dr Humphrey Rolls advised physical therapist he can increased to 3 and  1/2 litre and see how she is doing and also need follow up for pulmonary and also gave verbal for physical therapy in home once a week for 1 week  And 2 times a week for 2 weeks and 1 times a week for 1 week

## 2020-05-29 ENCOUNTER — Other Ambulatory Visit: Payer: Self-pay

## 2020-05-29 MED ORDER — VORICONAZOLE 200 MG PO TABS
200.0000 mg | ORAL_TABLET | Freq: Two times a day (BID) | ORAL | 0 refills | Status: DC
Start: 2020-05-29 — End: 2020-06-05

## 2020-05-29 NOTE — Telephone Encounter (Signed)
As per taylor ok to send pt was given from hospital and she had follow up next week with Dr Humphrey Rolls

## 2020-05-30 ENCOUNTER — Telehealth: Payer: Self-pay

## 2020-06-02 NOTE — Telephone Encounter (Signed)
Paperwork for pt assistance was faxed to [employer] for medication: VORICONAZOLE 200 mg tablet

## 2020-06-03 ENCOUNTER — Other Ambulatory Visit: Payer: Self-pay | Admitting: *Deleted

## 2020-06-03 NOTE — Patient Outreach (Signed)
[facility]) Care Management  06/03/2020  Annistyn Depass 1951-08-24 [id number]  Telephone outreach, TOC week 2.  Pulmonary practice has submitted pharmacy assistance application to [employer].  Birthday yesterday!  Unsuccessful outreach. Will try again next week.  [name]. [name], MSN, [facility] Gerontological Nurse Practitioner [facility] Care Management [phone]

## 2020-06-05 ENCOUNTER — Other Ambulatory Visit: Payer: Self-pay

## 2020-06-05 ENCOUNTER — Ambulatory Visit (INDEPENDENT_AMBULATORY_CARE_PROVIDER_SITE_OTHER): Payer: Medicare Other | Admitting: Internal Medicine

## 2020-06-05 ENCOUNTER — Encounter: Payer: Self-pay | Admitting: Internal Medicine

## 2020-06-05 ENCOUNTER — Telehealth: Payer: Self-pay

## 2020-06-05 VITALS — BP 130/52 | HR 96 | Temp 98.1°F | Resp 16 | Ht 61.0 in | Wt 143.8 lb

## 2020-06-05 DIAGNOSIS — Z9981 Dependence on supplemental oxygen: Secondary | ICD-10-CM | POA: Diagnosis not present

## 2020-06-05 DIAGNOSIS — B449 Aspergillosis, unspecified: Secondary | ICD-10-CM

## 2020-06-05 DIAGNOSIS — I509 Heart failure, unspecified: Secondary | ICD-10-CM

## 2020-06-05 DIAGNOSIS — J449 Chronic obstructive pulmonary disease, unspecified: Secondary | ICD-10-CM | POA: Diagnosis not present

## 2020-06-05 DIAGNOSIS — J4489 Other specified chronic obstructive pulmonary disease: Secondary | ICD-10-CM

## 2020-06-05 DIAGNOSIS — R0602 Shortness of breath: Secondary | ICD-10-CM | POA: Diagnosis not present

## 2020-06-05 DIAGNOSIS — I6523 Occlusion and stenosis of bilateral carotid arteries: Secondary | ICD-10-CM

## 2020-06-05 MED ORDER — VORICONAZOLE 200 MG PO TABS
200.0000 mg | ORAL_TABLET | Freq: Two times a day (BID) | ORAL | 0 refills | Status: DC
Start: 2020-06-05 — End: 2020-07-04

## 2020-06-05 NOTE — Progress Notes (Signed)
[facility] [place], [place] [address]  Pulmonary Sleep Medicine   Office Visit Note  Patient Name: Melissa Knox DOB: [birth year] MRN [id number]  Date of Service: 06/05/2020  Complaints/HPI: *She has not been able to get the voriconazole due to not being available at [facility]. She did not call us to let us know. In addition we have sent paperwork to the manufaturer to get her the medication at a discounted rate. At the present time she is not on anny medication   ROS  General: (-) fever, (-) chills, (-) night sweats, (-) weakness Skin: (-) rashes, (-) itching,. Eyes: (-) visual changes, (-) redness, (-) itching. Nose and Sinuses: (-) nasal stuffiness or itchiness, (-) postnasal drip, (-) nosebleeds, (-) sinus trouble. Mouth and Throat: (-) sore throat, (-) hoarseness. Neck: (-) swollen glands, (-) enlarged thyroid, (-) neck pain. Respiratory: + cough, (-) bloody sputum, + shortness of breath, - wheezing. Cardiovascular: - ankle swelling, (-) chest pain. Lymphatic: (-) lymph node enlargement. Neurologic: (-) numbness, (-) tingling. Psychiatric: (-) anxiety, (-) depression   Current Medication: Outpatient Encounter Medications as of 06/05/2020  Medication Sig Note  . albuterol (PROVENTIL) (2.5 MG/3ML) 0.083% nebulizer solution Take 3 mLs (2.5 mg total) by nebulization every 6 (six) hours as needed for wheezing or shortness of breath.   Marland Kitchen amLODipine (NORVASC) 10 MG tablet Take 1 tablet (10 mg total) by mouth daily.   Marland Kitchen aspirin 325 MG tablet Take 1 tablet (325 mg total) by mouth daily.   Marland Kitchen atorvastatin (LIPITOR) 10 MG tablet Take 1 tablet (10 mg total) by mouth daily at 6 PM.   . budesonide-formoterol (SYMBICORT) 160-4.5 MCG/ACT inhaler Inhale 2 puffs into the lungs 2 (two) times daily.   . carvedilol (COREG) 6.25 MG tablet TAKE 1 TABLET BY MOUTH TWICE A DAY (Patient taking differently: Take 6.25 mg by mouth in the morning and at  bedtime. )   . clopidogrel (PLAVIX) 75 MG tablet Take 1 tablet (75 mg total) by mouth daily.   Marland Kitchen docusate sodium (COLACE) 100 MG capsule Take 1 capsule (100 mg total) by mouth 2 (two) times daily as needed for mild constipation. 05/26/2020: Only taking as needed.  . feeding supplement, ENSURE ENLIVE, (ENSURE ENLIVE) LIQD Take 237 mLs by mouth 2 (two) times daily between meals.   . ferrous sulfate 325 (65 FE) MG tablet Take 1 tablet (325 mg total) by mouth 2 (two) times daily with a meal.   . furosemide (LASIX) 20 MG tablet Take 1 tablet (20 mg total) by mouth daily.   Marland Kitchen ipratropium-albuterol (DUONEB) 0.5-2.5 (3) MG/3ML SOLN Take 3 mLs by nebulization every 4 (four) hours as needed.   Marland Kitchen levothyroxine (SYNTHROID) 50 MCG tablet Take 1 tablet (50 mcg total) by mouth daily before breakfast.   . loratadine (CLARITIN) 10 MG tablet TAKE 1 TABLET BY MOUTH EVERY DAY FOR ALLERGIES AS NEEDED (Patient taking differently: Take 10 mg by mouth daily as needed for allergies. )   . losartan (COZAAR) 100 MG tablet Take 100 mg by mouth daily. 07/17/2019: .  . mirtazapine (REMERON) 15 MG tablet TAKE 1/2 OF A TABLET (7.5 MG TOTAL) BY MOUTH AT BEDTIME. (Patient taking differently: Take 7.5 mg by mouth at bedtime. )   . pantoprazole (PROTONIX) 40 MG tablet Take 1 tablet (40 mg total) by mouth 2 (two) times daily.   . predniSONE (STERAPRED UNI-PAK 21 TAB) 10 MG (21) TBPK tablet Take 6 pills on day 1, 5  pills on day 2, 4 pills on day 3, 3 pills on day 4, 2 pills on day 5, 1 pill on day 6 and then stop on day 7   . sucralfate (CARAFATE) 1 g tablet Take 1 tablet (1 g total) by mouth 4 (four) times daily -  with meals and at bedtime.   . voriconazole (VFEND) 200 MG tablet Take 1 tablet (200 mg total) by mouth 2 (two) times daily.    No facility-administered encounter medications on file as of 06/05/2020.    Surgical History: Past Surgical History:  Procedure Laterality Date  . CAROTID ENDARTERECTOMY Right 08/01/2019  .  ENDARTERECTOMY Right 08/01/2019   Procedure: ENDARTERECTOMY CAROTID;  Surgeon: Marty Heck, MD;  Location: [place];  Service: Vascular;  Laterality: Right;  . FRACTURE SURGERY     right ankle  . GALLBLADDER SURGERY  2012  . IR ARCH CERVICICEBRAL NON-SEL (MS)  07/19/2019  . MULTIPLE TOOTH EXTRACTIONS    . PATCH ANGIOPLASTY Right 08/01/2019   Procedure: Patch Angioplasty;  Surgeon: Marty Heck, MD;  Location: [place];  Service: Vascular;  Laterality: Right;  . TUBAL LIGATION      Medical History: Past Medical History:  Diagnosis Date  . Atherosclerotic heart disease 06/12/2015  . CHF (congestive heart failure) ([place])   . Chronic congestive heart failure with left ventricular diastolic dysfunction ([place]) 04/12/2018  . CKD (chronic kidney disease) stage 2, GFR 60-89 ml/min 06/12/2015  . COPD (chronic obstructive pulmonary disease) ([place])   . Full dentures   . Hypercalcemia 06/12/2015  . Hyperlipidemia 06/12/2015  . Hypertension 06/12/2015  . Hypothyroidism 06/12/2015  . Insomnia 06/12/2015  . Interstitial pneumonia ([place]) 04/12/2018  . On supplemental oxygen by nasal cannula    at HS and PRN  . Pulmonary heart disease ([place]) 06/12/2015  . Shortness of breath 06/12/2015  . Solitary pulmonary nodule 06/12/2015  . Wears glasses     Family History: Family History  Problem Relation Age of Onset  . Cancer Mother   . Hypertension Father   . Diabetes Father   . Cancer Father     Social History: Social History   Socioeconomic History  . Marital status: Married    Spouse name: Daryl  . Number of children: 3  . Years of education: 15  . Highest education level: 12th grade  Occupational History  . Not on file  Tobacco Use  . Smoking status: Former Smoker    Types: Cigarettes    Quit date: 2013    Years since quitting: 8.7  . Smokeless tobacco: Never Used  Vaping Use  . Vaping Use: Never used  Substance and Sexual Activity  . Alcohol use: No    Alcohol/week: 0.0  standard drinks  . Drug use: No  . Sexual activity: Yes    Birth control/protection: None  Other Topics Concern  . Not on file  Social History Narrative  . Not on file   Social Determinants of Health   Financial Resource Strain: Medium Risk  . Difficulty of Paying Living Expenses: Somewhat hard  Food Insecurity: No Food Insecurity  . Worried About Charity fundraiser in the Last Year: Never true  . Ran Out of Food in the Last Year: Never true  Transportation Needs: No Transportation Needs  . Lack of Transportation (Medical): No  . Lack of Transportation (Non-Medical): No  Physical Activity:   . Days of Exercise per Week: Not on file  . Minutes of Exercise per Session: Not  on file  Stress:   . Feeling of Stress : Not on file  Social Connections:   . Frequency of Communication with Friends and Family: Not on file  . Frequency of Social Gatherings with Friends and Family: Not on file  . Attends Religious Services: Not on file  . Active Member of Clubs or Organizations: Not on file  . Attends Archivist Meetings: Not on file  . Marital Status: Not on file  Intimate Partner Violence: Not At Risk  . Fear of Current or Ex-Partner: No  . Emotionally Abused: No  . Physically Abused: No  . Sexually Abused: No    Vital Signs: Blood pressure (!) 130/52, pulse 96, temperature 98.1 F (36.7 C), resp. rate 16, height 5\' 1"  (1.549 m), weight 143 lb 12.8 oz (65.2 kg), SpO2 93 %.  Examination: General Appearance: The patient is well-developed, well-nourished, and in no distress. Skin: Gross inspection of skin unremarkable. Head: normocephalic, no gross deformities. Eyes: no gross deformities noted. ENT: ears appear grossly normal no exudates. Neck: Supple. No thyromegaly. No LAD. Respiratory: few rhonchi are noted. Cardiovascular: Normal S1 and S2 without murmur or rub. Extremities: No cyanosis. pulses are equal. Neurologic: Alert and oriented. No involuntary  movements.  LABS: Recent Results (from the past 2160 hour(s))  Comprehensive metabolic panel     Status: Abnormal   Collection Time: 05/20/20 11:09 AM  Result Value Ref Range   Sodium 141 135 - 145 mmol/L   Potassium 2.5 (LL) 3.5 - 5.1 mmol/L    Comment: CRITICAL RESULT CALLED TO, READ BACK BY AND VERIFIED WITH  KATIE FERGUSON AT 1326 05/20/20 SDR    Chloride 100 98 - 111 mmol/L   CO2 30 22 - 32 mmol/L   Glucose, Bld 103 (H) 70 - 99 mg/dL    Comment: Glucose reference range applies only to samples taken after fasting for at least 8 hours.   BUN 16 8 - 23 mg/dL   Creatinine, Ser 1.37 (H) 0.44 - 1.00 mg/dL   Calcium 7.5 (L) 8.9 - 10.3 mg/dL   Total Protein 6.5 6.5 - 8.1 g/dL   Albumin 2.5 (L) 3.5 - 5.0 g/dL   AST 16 15 - 41 U/L   ALT 13 0 - 44 U/L   Alkaline Phosphatase 96 38 - 126 U/L   Total Bilirubin 0.6 0.3 - 1.2 mg/dL   GFR calc non Af Amer 40 (L) >60 mL/min   GFR calc Af Amer 46 (L) >60 mL/min   Anion gap 11 5 - 15    Comment: Performed at [facility], [address] [place], [place] [address]  CBC     Status: Abnormal   Collection Time: 05/20/20 11:09 AM  Result Value Ref Range   WBC 11.5 (H) 4.0 - 10.5 K/uL   RBC 2.67 (L) 3.87 - 5.11 MIL/uL   Hemoglobin 7.7 (L) 12.0 - 15.0 g/dL   HCT 25.3 (L) 36 - 46 %   MCV 94.8 80.0 - 100.0 fL   MCH 28.8 26.0 - 34.0 pg   MCHC 30.4 30.0 - 36.0 g/dL   RDW 14.2 11.5 - 15.5 %   Platelets 417 (H) 150 - 400 K/uL   nRBC 0.0 0.0 - 0.2 %    Comment: Performed at [facility], [address] [place], [place] [address]  Magnesium     Status: Abnormal   Collection Time: 05/20/20 11:09 AM  Result Value Ref Range   Magnesium 1.5 (L) 1.7 -  2.4 mg/dL    Comment: Performed at [facility], [address] [place], [place] [address]  Glucose, capillary     Status: Abnormal   Collection Time: 05/20/20 11:18 AM  Result Value Ref Range   Glucose-Capillary 114 (H) 70 - 99 mg/dL    Comment: Glucose reference range  applies only to samples taken after fasting for at least 8 hours.  SARS Coronavirus 2 by RT PCR (hospital order, performed in [facility] hospital lab) Nasopharyngeal Nasopharyngeal Swab     Status: None   Collection Time: 05/20/20 11:19 AM   Specimen: Nasopharyngeal Swab  Result Value Ref Range   SARS Coronavirus 2 NEGATIVE NEGATIVE    Comment: (NOTE) SARS-CoV-2 target nucleic acids are NOT DETECTED.  The SARS-CoV-2 RNA is generally detectable in upper and lower respiratory specimens during the acute phase of infection. The lowest concentration of SARS-CoV-2 viral copies this assay can detect is 250 copies / mL. A negative result does not preclude SARS-CoV-2 infection and should not be used as the sole basis for treatment or other patient management decisions.  A negative result may occur with improper specimen collection / handling, submission of specimen other than nasopharyngeal swab, presence of viral mutation(s) within the areas targeted by this assay, and inadequate number of viral copies (<250 copies / mL). A negative result must be combined with clinical observations, patient history, and epidemiological information.  Fact Sheet for Patients:   StrictlyIdeas.no  Fact Sheet for Healthcare Providers: BankingDealers.co.za  This test is not yet approved or  cleared by the [place] FDA and has been authorized for detection and/or diagnosis of SARS-CoV-2 by FDA under an Emergency Use Authorization (EUA).  This EUA will remain in effect (meaning this test can be used) for the duration of the COVID-19 declaration under Section 564(b)(1) of the Act, 21 U.S.C. section 360bbb-3(b)(1), unless the authorization is terminated or revoked sooner.  Performed at [facility], [address] [place], [place] [address]   HIV Antibody (routine testing w rflx)     Status: None   Collection Time: 05/21/20  5:32 AM  Result Value  Ref Range   HIV Screen 4th Generation wRfx Non Reactive Non Reactive    Comment: Performed at [facility], [place] [address], [place], [place] [address]  CBC     Status: Abnormal   Collection Time: 05/21/20  5:32 AM  Result Value Ref Range   WBC 5.8 4.0 - 10.5 K/uL   RBC 2.63 (L) 3.87 - 5.11 MIL/uL   Hemoglobin 7.5 (L) 12.0 - 15.0 g/dL   HCT 24.6 (L) 36 - 46 %   MCV 93.5 80.0 - 100.0 fL   MCH 28.5 26.0 - 34.0 pg   MCHC 30.5 30.0 - 36.0 g/dL   RDW 14.0 11.5 - 15.5 %   Platelets 429 (H) 150 - 400 K/uL   nRBC 0.0 0.0 - 0.2 %    Comment: Performed at [facility], [address], [place], [place] [address]  Basic metabolic panel     Status: Abnormal   Collection Time: 05/21/20  5:32 AM  Result Value Ref Range   Sodium 143 135 - 145 mmol/L   Potassium 3.4 (L) 3.5 - 5.1 mmol/L   Chloride 105 98 - 111 mmol/L   CO2 27 22 - 32 mmol/L   Glucose, Bld 154 (H) 70 - 99 mg/dL    Comment: Glucose reference range applies only to samples taken after fasting for at least  8 hours.   BUN 16 8 - 23 mg/dL   Creatinine, Ser 1.32 (H) 0.44 - 1.00 mg/dL   Calcium 8.4 (L) 8.9 - 10.3 mg/dL   GFR calc non Af Amer 41 (L) >60 mL/min   GFR calc Af Amer 48 (L) >60 mL/min   Anion gap 11 5 - 15    Comment: Performed at [facility], [address], [place], [place] [address]  Magnesium     Status: None   Collection Time: 05/21/20  5:32 AM  Result Value Ref Range   Magnesium 2.3 1.7 - 2.4 mg/dL    Comment: Performed at [facility], [address] [place], [place] [address]  Phosphorus     Status: None   Collection Time: 05/21/20  5:32 AM  Result Value Ref Range   Phosphorus 2.9 2.5 - 4.6 mg/dL    Comment: Performed at [facility], [address] [place], [place] [address]  Glucose, capillary     Status: Abnormal   Collection Time: 05/21/20  7:26 AM  Result Value Ref Range   Glucose-Capillary 146 (H) 70 - 99 mg/dL    Comment: Glucose reference range applies  only to samples taken after fasting for at least 8 hours.  CBC with Differential/Platelet     Status: Abnormal   Collection Time: 05/22/20  4:21 AM  Result Value Ref Range   WBC 9.6 4.0 - 10.5 K/uL   RBC 2.59 (L) 3.87 - 5.11 MIL/uL   Hemoglobin 7.3 (L) 12.0 - 15.0 g/dL   HCT 24.8 (L) 36 - 46 %   MCV 95.8 80.0 - 100.0 fL   MCH 28.2 26.0 - 34.0 pg   MCHC 29.4 (L) 30.0 - 36.0 g/dL   RDW 14.4 11.5 - 15.5 %   Platelets 420 (H) 150 - 400 K/uL   nRBC 0.0 0.0 - 0.2 %   Neutrophils Relative % 90 %   Neutro Abs 8.6 (H) 1.7 - 7.7 K/uL   Lymphocytes Relative 7 %   Lymphs Abs 0.7 0.7 - 4.0 K/uL   Monocytes Relative 3 %   Monocytes Absolute 0.3 0.1 - 1.0 K/uL   Eosinophils Relative 0 %   Eosinophils Absolute 0.0 0 - 0 K/uL   Basophils Relative 0 %   Basophils Absolute 0.0 0 - 0 K/uL   Immature Granulocytes 0 %   Abs Immature Granulocytes 0.04 0.00 - 0.07 K/uL    Comment: Performed at [facility], [address] [place], [place] [address]  Comprehensive metabolic panel     Status: Abnormal   Collection Time: 05/22/20  4:21 AM  Result Value Ref Range   Sodium 143 135 - 145 mmol/L   Potassium 4.0 3.5 - 5.1 mmol/L   Chloride 107 98 - 111 mmol/L   CO2 28 22 - 32 mmol/L   Glucose, Bld 122 (H) 70 - 99 mg/dL    Comment: Glucose reference range applies only to samples taken after fasting for at least 8 hours.   BUN 17 8 - 23 mg/dL   Creatinine, Ser 1.34 (H) 0.44 - 1.00 mg/dL   Calcium 8.7 (L) 8.9 - 10.3 mg/dL   Total Protein 6.7 6.5 - 8.1 g/dL   Albumin 2.5 (L) 3.5 - 5.0 g/dL   AST 20 15 - 41 U/L   ALT 16 0 - 44 U/L   Alkaline Phosphatase 83 38 - 126 U/L   Total Bilirubin 0.4 0.3 - 1.2 mg/dL   GFR calc non  Af Amer 41 (L) >60 mL/min   GFR calc Af Amer 47 (L) >60 mL/min   Anion gap 8 5 - 15    Comment: Performed at [facility], [address] [place], [place] [address]  Phosphorus     Status: None   Collection Time: 05/22/20  4:21 AM  Result Value Ref Range    Phosphorus 3.1 2.5 - 4.6 mg/dL    Comment: Performed at [facility], [address] [place], [place] [address]  Magnesium     Status: None   Collection Time: 05/22/20  4:21 AM  Result Value Ref Range   Magnesium 2.0 1.7 - 2.4 mg/dL    Comment: Performed at [facility], [address] [place], [place] [address]    Radiology: CT ANGIO CHEST PE W OR WO CONTRAST  Result Date: 05/21/2020 CLINICAL DATA:  Weakness and hypoxia. Elevated D-dimer. History of COPD and chronic respiratory failure. EXAM: CT ANGIOGRAPHY CHEST WITH CONTRAST TECHNIQUE: Multidetector CT imaging of the chest was performed using the standard protocol during bolus administration of intravenous contrast. Multiplanar CT image reconstructions and MIPs were obtained to evaluate the vascular anatomy. CONTRAST:  51mL OMNIPAQUE IOHEXOL 350 MG/ML SOLN COMPARISON:  CT chest dated July 20, 2019. FINDINGS: Cardiovascular: Satisfactory opacification of the pulmonary arteries to the segmental level. No evidence of pulmonary embolism. Unchanged borderline cardiomegaly. No pericardial effusion. No thoracic aortic aneurysm or dissection. Coronary, aortic arch, and branch vessel atherosclerotic vascular disease. 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 Moderate to severe stenosis of the abdominal aorta between the celiac artery and SMA due to calcified atherosclerotic plaque. Mediastinum/Nodes: No enlarged mediastinal, hilar, or axillary lymph nodes. Thyroid gland, trachea, and esophagus demonstrate no significant findings. Lungs/Pleura: Extensive centrilobular and paraseptal emphysema again noted with scattered pleuroparenchymal scarring and architectural distortion. There is increased patchy nodular thickening in the right upper lobe (series 6, image 23). Additional larger nodule in the right upper lobe around the measuring 13 x 7 mm (series 6, image 43),  previously 7 x 6 mm. Chronic consolidation in posterior aspect of the right upper currently measures 4.3 x 2.3 cm, previously 4.7 x 2.4 cm. Slightly enlarged intermediate density material within a cavity in the lingula (series 6, image 52), currently measuring 2.1 x 1.8 cm, previously 1.9 x 1.6 cm. No pleural effusion or pneumothorax. Upper Abdomen: No acute abnormality. Musculoskeletal: No chest wall abnormality. No acute or significant osseous findings. Review of the MIP images confirms the above findings. IMPRESSION: 1. No evidence of pulmonary embolism. 2. Increased patchy nodular thickening in the right upper lobe, potentially infectious or inflammatory. Short-term interval follow-up chest CT or PET-CT in 3 months is recommended to exclude neoplasm. 3. Enlarging now 10 mm mean diameter nodule in the right upper lobe, previously 7 mm. Short-term interval follow-up chest CT or PET-CT in 3 months is recommended. 4. Increasing intermediate density material within a cavity in the lingula, suspicious for fungus ball/pulmonary aspergillosis. 5. Unchanged chronic consolidation in posterior aspect of the right upper lobe, potentially from chronic infection/inflammation. 6. 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 7. Aortic Atherosclerosis (ICD10-I70.0) and Emphysema (ICD10-J43.9). Electronically Signed   By: Titus Dubin M.D.   On: 05/21/2020 14:11   DG Chest Portable 1 View  Result Date: 05/20/2020 CLINICAL DATA:  69 year old female with chronic lung disease, shortness of breath. EXAM: PORTABLE CHEST 1 VIEW COMPARISON:  Chest CT 07/20/2019 and earlier. FINDINGS: Portable AP semi upright view at 1220 hours. Stable lung volumes and mediastinal contours. Coarse bilateral pulmonary opacity with chronic architectural distortion in the periphery of both lungs. Bilateral ventilation stable or improved compared to 2020 exams. No definite  acute pulmonary opacity. No pneumothorax or pleural effusion. No cardiomegaly. Calcified aortic atherosclerosis. Visualized tracheal air column is within normal limits. Paucity of bowel gas in the upper abdomen. No acute osseous abnormality identified. IMPRESSION: 1. Chronic lung disease. No superimposed acute findings are identified. 2. Aortic Atherosclerosis (ICD10-I70.0) and Emphysema (ICD10-J43.9). Electronically Signed   By: Genevie Ann M.D.   On: 05/20/2020 12:37    No results found.  DG Chest 1 View  Result Date: 05/22/2020 CLINICAL DATA:  Dyspnea EXAM: CHEST  1 VIEW COMPARISON:  05/20/2020 FINDINGS: The lungs are symmetrically expanded. Pulmonary insufflation is stable when compared to prior examination. Interstitial thickening and coarsening is again seen in keeping with the extensive underlying cystic lung disease noted on CT examination of 05/21/2020. Superimposed consolidation within the right upper lobe appears stable since prior examination. No pneumothorax or pleural effusion. Cardiac size within normal limits. No acute bone abnormality. IMPRESSION: Stable right upper lobe consolidation. Unchanged interstitial coarsening related to diffuse underlying interstitial lung disease. Electronically Signed   By: Fidela Salisbury MD   On: 05/22/2020 04:18   CT ANGIO CHEST PE W OR WO CONTRAST  Result Date: 05/21/2020 CLINICAL DATA:  Weakness and hypoxia. Elevated D-dimer. History of COPD and chronic respiratory failure. EXAM: CT ANGIOGRAPHY CHEST WITH CONTRAST TECHNIQUE: Multidetector CT imaging of the chest was performed using the standard protocol during bolus administration of intravenous contrast. Multiplanar CT image reconstructions and MIPs were obtained to evaluate the vascular anatomy. CONTRAST:  37mL OMNIPAQUE IOHEXOL 350 MG/ML SOLN COMPARISON:  CT chest dated July 20, 2019. FINDINGS: Cardiovascular: Satisfactory opacification of the pulmonary arteries to the segmental level. No evidence of  pulmonary embolism. Unchanged borderline cardiomegaly. No pericardial effusion. No thoracic aortic aneurysm or dissection. Coronary, aortic arch, and branch vessel atherosclerotic vascular disease. 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 Moderate to severe stenosis of the abdominal aorta between the celiac artery and SMA due to calcified atherosclerotic plaque. Mediastinum/Nodes: No enlarged mediastinal, hilar, or axillary lymph nodes. Thyroid gland, trachea, and esophagus demonstrate no significant findings. Lungs/Pleura: Extensive centrilobular and paraseptal emphysema again noted with scattered pleuroparenchymal scarring and architectural distortion. There is increased patchy nodular thickening in the right upper lobe (series 6, image 23). Additional larger nodule in the right upper lobe around the measuring 13 x 7 mm (series 6, image 43), previously 7 x 6 mm. Chronic consolidation in posterior aspect of the right upper currently measures 4.3 x 2.3 cm, previously 4.7 x 2.4 cm. Slightly enlarged intermediate density material within a cavity in the lingula (series 6, image 52), currently measuring 2.1 x 1.8 cm, previously 1.9 x 1.6 cm. No pleural effusion or pneumothorax. Upper Abdomen: No acute abnormality. Musculoskeletal: No chest wall abnormality. No acute or significant osseous findings. Review of the MIP images confirms the above findings. IMPRESSION: 1. No evidence of pulmonary embolism. 2. Increased patchy nodular thickening in the right upper lobe, potentially infectious or inflammatory. Short-term interval follow-up chest CT or PET-CT in 3 months is recommended to exclude neoplasm. 3. Enlarging now 10 mm mean diameter nodule in the right upper lobe, previously 7 mm. Short-term interval follow-up chest CT or PET-CT in 3 months is recommended.  4. Increasing intermediate density material within a cavity in the lingula, suspicious for  fungus ball/pulmonary aspergillosis. 5. Unchanged chronic consolidation in posterior aspect of the right upper lobe, potentially from chronic infection/inflammation. 6. 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 7. Aortic Atherosclerosis (ICD10-I70.0) and Emphysema (ICD10-J43.9). Electronically Signed   By: Titus Dubin M.D.   On: 05/21/2020 14:11   DG Chest Portable 1 View  Result Date: 05/20/2020 CLINICAL DATA:  69 year old female with chronic lung disease, shortness of breath. EXAM: PORTABLE CHEST 1 VIEW COMPARISON:  Chest CT 07/20/2019 and earlier. FINDINGS: Portable AP semi upright view at 1220 hours. Stable lung volumes and mediastinal contours. Coarse bilateral pulmonary opacity with chronic architectural distortion in the periphery of both lungs. Bilateral ventilation stable or improved compared to 2020 exams. No definite acute pulmonary opacity. No pneumothorax or pleural effusion. No cardiomegaly. Calcified aortic atherosclerosis. Visualized tracheal air column is within normal limits. Paucity of bowel gas in the upper abdomen. No acute osseous abnormality identified. IMPRESSION: 1. Chronic lung disease. No superimposed acute findings are identified. 2. Aortic Atherosclerosis (ICD10-I70.0) and Emphysema (ICD10-J43.9). Electronically Signed   By: Genevie Ann M.D.   On: 05/20/2020 12:37      Assessment and Plan: Patient Active Problem List   Diagnosis Date Noted  . Hypokalemia 05/20/2020  . Hypomagnesemia 05/20/2020  . Chronic kidney disease, stage 3 ([place]) 05/20/2020  . Encounter for general adult medical examination with abnormal findings 09/08/2019  . Dysuria 09/08/2019  . Cerebrovascular disease 09/01/2019  . Carotid artery stenosis, symptomatic, right 08/01/2019  . Acute CVA (cerebrovascular accident) ([place]) 07/17/2019  . Peptic ulcer disease 02/17/2019  . Malnutrition of moderate degree 09/07/2018  .  Acute on chronic respiratory failure with hypoxia ([place]) 09/05/2018  . Screening for breast cancer 07/19/2018  . HCAP (healthcare-associated pneumonia) 06/03/2018  . Chronic congestive heart failure with left ventricular diastolic dysfunction ([place]) 04/12/2018  . Acute on chronic respiratory failure ([place]) 04/11/2018  . Obstructive chronic bronchitis without exacerbation ([place]) 02/17/2018  . Major depression, chronic 02/17/2018  . 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 Pollen allergies 01/16/2018  . CHF (congestive heart failure) ([place]) 05/28/2017  . Syncope 02/06/2017  . Pancreatic mass 02/06/2017  . Iron deficiency anemia 05/01/2016  . Anemia in chronic renal disease 04/26/2016  . Insomnia 06/12/2015  . CKD (chronic kidney disease) stage 2, GFR 60-89 ml/min 06/12/2015  . Hypercalcemia 06/12/2015  . Hypothyroidism 06/12/2015  . Pulmonary heart disease ([place]) 06/12/2015  . Hyperlipidemia 06/12/2015  . Essential hypertension 06/12/2015  . Atherosclerotic heart disease 06/12/2015  . Solitary pulmonary nodule 06/12/2015  . 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  2. COPD on oxygen therapy and inhalers will continue with present management 3. 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 CHF compensated will follow along  General Counseling: I have discussed the findings of the evaluation and examination with Melissa Knox.  I have also discussed any further diagnostic evaluation thatmay be needed or ordered today. Melissa Knox verbalizes understanding of the findings of todays visit. We also reviewed her medications today and discussed drug interactions and side effects including but not limited excessive drowsiness and altered mental states. We also discussed that there is always a risk not just to her but also people around her. she has been encouraged to call the office with  any  questions or concerns that should arise related to todays visit.  Orders Placed This Encounter  Procedures  . Spirometry with Graph    Order Specific Question:   Where should this test be performed?    Answer:   [facility]    Order Specific Question:   Basic spirometry    Answer:   Yes    Order Specific Question:   Spirometry pre & post bronchodilator    Answer:   No     Time spent: 30  I have personally obtained a history, examined the patient, evaluated laboratory and imaging results, formulated the assessment and plan and placed orders.    Allyne Gee, MD [facility] Sleep medicine

## 2020-06-05 NOTE — Telephone Encounter (Signed)
Spoke with [facility] for voriconazole 200 mg #60 with no refills and pt advised that take good rx coupon with her when she go for pickup

## 2020-06-05 NOTE — Patient Instructions (Signed)
Chronic Obstructive Pulmonary Disease Chronic obstructive pulmonary disease (COPD) is a long-term (chronic) lung problem. When you have COPD, it is hard for air to get in and out of your lungs. Usually the condition gets worse over time, and your lungs will never return to normal. There are things you can do to keep yourself as healthy as possible.  Your doctor may treat your condition with: ? Medicines. ? Oxygen. ? Lung surgery.  Your doctor may also recommend: ? Rehabilitation. This includes steps to make your body work better. It may involve a team of specialists. ? Quitting smoking, if you smoke. ? Exercise and changes to your diet. ? Comfort measures (palliative care). Follow these instructions at home: Medicines  Take over-the-counter and prescription medicines only as told by your doctor.  Talk to your doctor before taking any cough or allergy medicines. You may need to avoid medicines that cause your lungs to be dry. Lifestyle  If you smoke, stop. Smoking makes the problem worse. If you need help quitting, ask your doctor.  Avoid being around things that make your breathing worse. This may include smoke, chemicals, and fumes.  Stay active, but remember to rest as well.  Learn and use tips on how to relax.  Make sure you get enough sleep. Most adults need at least 7 hours of sleep every night.  Eat healthy foods. Eat smaller meals more often. Rest before meals. Controlled breathing Learn and use tips on how to control your breathing as told by your doctor. Try:  Breathing in (inhaling) through your nose for 1 second. Then, pucker your lips and breath out (exhale) through your lips for 2 seconds.  Putting one hand on your belly (abdomen). Breathe in slowly through your nose for 1 second. Your hand on your belly should move out. Pucker your lips and breathe out slowly through your lips. Your hand on your belly should move in as you breathe out.  Controlled coughing Learn  and use controlled coughing to clear mucus from your lungs. Follow these steps: 1. Lean your head a little forward. 2. Breathe in deeply. 3. Try to hold your breath for 3 seconds. 4. Keep your mouth slightly open while coughing 2 times. 5. Spit any mucus out into a tissue. 6. Rest and do the steps again 1 or 2 times as needed. General instructions  Make sure you get all the shots (vaccines) that your doctor recommends. Ask your doctor about a flu shot and a pneumonia shot.  Use oxygen therapy and pulmonary rehabilitation if told by your doctor. If you need home oxygen therapy, ask your doctor if you should buy a tool to measure your oxygen level (oximeter).  Make a COPD action plan with your doctor. This helps you to know what to do if you feel worse than usual.  Manage any other conditions you have as told by your doctor.  Avoid going outside when it is very hot, cold, or humid.  Avoid people who have a sickness you can catch (contagious).  Keep all follow-up visits as told by your doctor. This is important. Contact a doctor if:  You cough up more mucus than usual.  There is a change in the color or thickness of the mucus.  It is harder to breathe than usual.  Your breathing is faster than usual.  You have trouble sleeping.  You need to use your medicines more often than usual.  You have trouble doing your normal activities such as getting dressed   or walking around the house. Get help right away if:  You have shortness of breath while resting.  You have shortness of breath that stops you from: ? Being able to talk. ? Doing normal activities.  Your chest hurts for longer than 5 minutes.  Your skin color is more blue than usual.  Your pulse oximeter shows that you have low oxygen for longer than 5 minutes.  You have a fever.  You feel too tired to breathe normally. Summary  Chronic obstructive pulmonary disease (COPD) is a long-term lung problem.  The way your  lungs work will never return to normal. Usually the condition gets worse over time. There are things you can do to keep yourself as healthy as possible.  Take over-the-counter and prescription medicines only as told by your doctor.  If you smoke, stop. Smoking makes the problem worse. This information is not intended to replace advice given to you by your health care provider. Make sure you discuss any questions you have with your health care provider. Document Revised: 07/29/2017 Document Reviewed: 09/20/2016 Elsevier Patient Education  2020 [employer]  

## 2020-06-06 DIAGNOSIS — B440 Invasive pulmonary aspergillosis: Secondary | ICD-10-CM | POA: Diagnosis not present

## 2020-06-06 DIAGNOSIS — J440 Chronic obstructive pulmonary disease with acute lower respiratory infection: Secondary | ICD-10-CM | POA: Diagnosis not present

## 2020-06-06 DIAGNOSIS — J441 Chronic obstructive pulmonary disease with (acute) exacerbation: Secondary | ICD-10-CM | POA: Diagnosis not present

## 2020-06-06 DIAGNOSIS — J9621 Acute and chronic respiratory failure with hypoxia: Secondary | ICD-10-CM | POA: Diagnosis not present

## 2020-06-06 DIAGNOSIS — J849 Interstitial pulmonary disease, unspecified: Secondary | ICD-10-CM | POA: Diagnosis not present

## 2020-06-06 DIAGNOSIS — I279 Pulmonary heart disease, unspecified: Secondary | ICD-10-CM | POA: Diagnosis not present

## 2020-06-10 DIAGNOSIS — J441 Chronic obstructive pulmonary disease with (acute) exacerbation: Secondary | ICD-10-CM | POA: Diagnosis not present

## 2020-06-10 DIAGNOSIS — J9621 Acute and chronic respiratory failure with hypoxia: Secondary | ICD-10-CM | POA: Diagnosis not present

## 2020-06-10 DIAGNOSIS — J849 Interstitial pulmonary disease, unspecified: Secondary | ICD-10-CM | POA: Diagnosis not present

## 2020-06-10 DIAGNOSIS — B440 Invasive pulmonary aspergillosis: Secondary | ICD-10-CM | POA: Diagnosis not present

## 2020-06-10 DIAGNOSIS — I279 Pulmonary heart disease, unspecified: Secondary | ICD-10-CM | POA: Diagnosis not present

## 2020-06-10 DIAGNOSIS — J440 Chronic obstructive pulmonary disease with acute lower respiratory infection: Secondary | ICD-10-CM | POA: Diagnosis not present

## 2020-06-11 ENCOUNTER — Other Ambulatory Visit: Payer: Self-pay | Admitting: *Deleted

## 2020-06-11 DIAGNOSIS — B440 Invasive pulmonary aspergillosis: Secondary | ICD-10-CM | POA: Diagnosis not present

## 2020-06-11 DIAGNOSIS — I279 Pulmonary heart disease, unspecified: Secondary | ICD-10-CM | POA: Diagnosis not present

## 2020-06-11 DIAGNOSIS — J440 Chronic obstructive pulmonary disease with acute lower respiratory infection: Secondary | ICD-10-CM | POA: Diagnosis not present

## 2020-06-11 DIAGNOSIS — J849 Interstitial pulmonary disease, unspecified: Secondary | ICD-10-CM | POA: Diagnosis not present

## 2020-06-11 DIAGNOSIS — J9621 Acute and chronic respiratory failure with hypoxia: Secondary | ICD-10-CM | POA: Diagnosis not present

## 2020-06-11 DIAGNOSIS — J441 Chronic obstructive pulmonary disease with (acute) exacerbation: Secondary | ICD-10-CM | POA: Diagnosis not present

## 2020-06-11 NOTE — Patient Outreach (Signed)
[facility]) Care Management  06/11/2020  Melissa Knox 1950/11/17 [id number]  Telephone outreach.  [name] has been to see her pulmonologist. She is stable. They have applied for the expensive antifungal medication that she needs to be on. She bought this months supply.  She takes this PO, uses her nebs usually bid, symbicort daily, and albuterol prn.  Discusses COPD ACTION PLAN - self montoring daily, daily meds. When to call MD to avoid hospitalizations.  Discussed need to complete her Advanced Directives - received documents for herself and her husband. Advocated that they complete these within the next couple of weeks!  No new issues. Will call again in one week.  [name]. [name], MSN, [facility] Gerontological Nurse Practitioner [facility] Care Management [phone]

## 2020-06-12 DIAGNOSIS — J441 Chronic obstructive pulmonary disease with (acute) exacerbation: Secondary | ICD-10-CM | POA: Diagnosis not present

## 2020-06-12 DIAGNOSIS — I279 Pulmonary heart disease, unspecified: Secondary | ICD-10-CM | POA: Diagnosis not present

## 2020-06-12 DIAGNOSIS — J849 Interstitial pulmonary disease, unspecified: Secondary | ICD-10-CM | POA: Diagnosis not present

## 2020-06-12 DIAGNOSIS — J9621 Acute and chronic respiratory failure with hypoxia: Secondary | ICD-10-CM | POA: Diagnosis not present

## 2020-06-12 DIAGNOSIS — J440 Chronic obstructive pulmonary disease with acute lower respiratory infection: Secondary | ICD-10-CM | POA: Diagnosis not present

## 2020-06-12 DIAGNOSIS — B440 Invasive pulmonary aspergillosis: Secondary | ICD-10-CM | POA: Diagnosis not present

## 2020-06-16 ENCOUNTER — Other Ambulatory Visit: Payer: Self-pay

## 2020-06-16 DIAGNOSIS — E782 Mixed hyperlipidemia: Secondary | ICD-10-CM

## 2020-06-16 MED ORDER — ATORVASTATIN CALCIUM 10 MG PO TABS: 10.0000 mg | ORAL_TABLET | Freq: Every day | ORAL | 5 refills | Status: AC

## 2020-06-18 ENCOUNTER — Other Ambulatory Visit: Payer: Self-pay | Admitting: *Deleted

## 2020-06-18 NOTE — Patient Outreach (Signed)
[facility]) Care Management  06/18/2020  Melissa Knox 09/24/1950 [id number]  Telephone outreach for follow up respiratory status: unsuccessful.   Will call again next week.  Advanced Directives:  [name]. [name], MSN, [facility] Gerontological Nurse Practitioner [facility] Care Management [phone]

## 2020-06-22 DIAGNOSIS — I279 Pulmonary heart disease, unspecified: Secondary | ICD-10-CM | POA: Diagnosis not present

## 2020-06-22 DIAGNOSIS — Z9981 Dependence on supplemental oxygen: Secondary | ICD-10-CM | POA: Diagnosis not present

## 2020-06-22 DIAGNOSIS — J849 Interstitial pulmonary disease, unspecified: Secondary | ICD-10-CM | POA: Diagnosis not present

## 2020-06-22 DIAGNOSIS — N183 Chronic kidney disease, stage 3 unspecified: Secondary | ICD-10-CM | POA: Diagnosis not present

## 2020-06-22 DIAGNOSIS — E039 Hypothyroidism, unspecified: Secondary | ICD-10-CM | POA: Diagnosis not present

## 2020-06-22 DIAGNOSIS — B440 Invasive pulmonary aspergillosis: Secondary | ICD-10-CM | POA: Diagnosis not present

## 2020-06-22 DIAGNOSIS — Z7951 Long term (current) use of inhaled steroids: Secondary | ICD-10-CM | POA: Diagnosis not present

## 2020-06-22 DIAGNOSIS — J9621 Acute and chronic respiratory failure with hypoxia: Secondary | ICD-10-CM | POA: Diagnosis not present

## 2020-06-22 DIAGNOSIS — Z8701 Personal history of pneumonia (recurrent): Secondary | ICD-10-CM | POA: Diagnosis not present

## 2020-06-22 DIAGNOSIS — I5032 Chronic diastolic (congestive) heart failure: Secondary | ICD-10-CM | POA: Diagnosis not present

## 2020-06-22 DIAGNOSIS — F339 Major depressive disorder, recurrent, unspecified: Secondary | ICD-10-CM | POA: Diagnosis not present

## 2020-06-22 DIAGNOSIS — I130 Hypertensive heart and chronic kidney disease with heart failure and stage 1 through stage 4 chronic kidney disease, or unspecified chronic kidney disease: Secondary | ICD-10-CM | POA: Diagnosis not present

## 2020-06-22 DIAGNOSIS — R911 Solitary pulmonary nodule: Secondary | ICD-10-CM | POA: Diagnosis not present

## 2020-06-22 DIAGNOSIS — D631 Anemia in chronic kidney disease: Secondary | ICD-10-CM | POA: Diagnosis not present

## 2020-06-22 DIAGNOSIS — Z87891 Personal history of nicotine dependence: Secondary | ICD-10-CM | POA: Diagnosis not present

## 2020-06-22 DIAGNOSIS — D473 Essential (hemorrhagic) thrombocythemia: Secondary | ICD-10-CM | POA: Diagnosis not present

## 2020-06-22 DIAGNOSIS — Z7902 Long term (current) use of antithrombotics/antiplatelets: Secondary | ICD-10-CM | POA: Diagnosis not present

## 2020-06-22 DIAGNOSIS — G4700 Insomnia, unspecified: Secondary | ICD-10-CM | POA: Diagnosis not present

## 2020-06-22 DIAGNOSIS — J440 Chronic obstructive pulmonary disease with acute lower respiratory infection: Secondary | ICD-10-CM | POA: Diagnosis not present

## 2020-06-22 DIAGNOSIS — J441 Chronic obstructive pulmonary disease with (acute) exacerbation: Secondary | ICD-10-CM | POA: Diagnosis not present

## 2020-06-22 DIAGNOSIS — E785 Hyperlipidemia, unspecified: Secondary | ICD-10-CM | POA: Diagnosis not present

## 2020-06-22 DIAGNOSIS — I2510 Atherosclerotic heart disease of native coronary artery without angina pectoris: Secondary | ICD-10-CM | POA: Diagnosis not present

## 2020-06-22 DIAGNOSIS — E876 Hypokalemia: Secondary | ICD-10-CM | POA: Diagnosis not present

## 2020-06-24 DIAGNOSIS — H35033 Hypertensive retinopathy, bilateral: Secondary | ICD-10-CM | POA: Diagnosis not present

## 2020-06-24 DIAGNOSIS — H34231 Retinal artery branch occlusion, right eye: Secondary | ICD-10-CM | POA: Diagnosis not present

## 2020-06-24 DIAGNOSIS — H43823 Vitreomacular adhesion, bilateral: Secondary | ICD-10-CM | POA: Diagnosis not present

## 2020-06-24 DIAGNOSIS — H353132 Nonexudative age-related macular degeneration, bilateral, intermediate dry stage: Secondary | ICD-10-CM | POA: Diagnosis not present

## 2020-06-25 ENCOUNTER — Other Ambulatory Visit: Payer: Self-pay | Admitting: *Deleted

## 2020-06-25 NOTE — Patient Outreach (Signed)
[facility]) Care Management  06/25/2020  Melissa Knox 1951-08-02 [id number]  Telephone outreach. Unsuccessful, left message and requested a return call.  [name]. [name], MSN, [facility] Gerontological Nurse Practitioner [facility] Care Management [phone]

## 2020-06-26 ENCOUNTER — Telehealth: Payer: Self-pay

## 2020-06-26 NOTE — Telephone Encounter (Signed)
Plan of care signed and placed in [place] folder ready for pickup.

## 2020-07-02 ENCOUNTER — Other Ambulatory Visit: Payer: Self-pay | Admitting: *Deleted

## 2020-07-04 ENCOUNTER — Other Ambulatory Visit: Payer: Self-pay

## 2020-07-04 MED ORDER — VORICONAZOLE 200 MG PO TABS: 200.0000 mg | ORAL_TABLET | Freq: Two times a day (BID) | ORAL | 0 refills | Status: AC

## 2020-07-05 DIAGNOSIS — R0689 Other abnormalities of breathing: Secondary | ICD-10-CM | POA: Diagnosis not present

## 2020-07-05 DIAGNOSIS — R4020 Unspecified coma: Secondary | ICD-10-CM | POA: Diagnosis not present

## 2020-07-05 DIAGNOSIS — R000 Tachycardia, unspecified: Secondary | ICD-10-CM | POA: Diagnosis not present

## 2020-07-05 DIAGNOSIS — I499 Cardiac arrhythmia, unspecified: Secondary | ICD-10-CM | POA: Diagnosis not present

## 2020-07-10 ENCOUNTER — Ambulatory Visit: Payer: Medicare Other | Admitting: Nurse Practitioner

## 2020-07-11 ENCOUNTER — Other Ambulatory Visit: Payer: Self-pay | Admitting: *Deleted

## 2020-07-11 NOTE — Patient Outreach (Signed)
[facility]) Care Management  07/11/2020  Ambri Miltner 25-Aug-1951 [id number]  Third telephone outreach, unsuccessful. Will close case, send closure letter to pt and MD.  [name]. [name], MSN, [facility] Gerontological Nurse Practitioner [facility] Care Management [phone]

## 2020-07-14 ENCOUNTER — Encounter (HOSPITAL_COMMUNITY): Payer: Medicare Other

## 2020-07-14 ENCOUNTER — Ambulatory Visit: Payer: Medicare Other

## 2020-07-23 ENCOUNTER — Ambulatory Visit: Payer: Medicare Other | Admitting: Internal Medicine

## 2020-07-28 ENCOUNTER — Telehealth: Payer: Self-pay

## 2020-07-28 ENCOUNTER — Ambulatory Visit: Payer: Medicare Other | Admitting: Internal Medicine

## 2020-07-28 NOTE — Telephone Encounter (Signed)
Verbal order for increase of oxygen and plan of care signed by provider and placed in [place] folder.

## 2020-07-30 DIAGNOSIS — 419620001 Death: Secondary | SNOMED CT | POA: Diagnosis not present

## 2020-07-30 DEATH — deceased

## 2020-09-04 ENCOUNTER — Ambulatory Visit: Payer: Medicare Other | Admitting: Nurse Practitioner
